# Patient Record
Sex: Male | Born: 1935 | Race: White | Hispanic: No | Marital: Married | Smoking: Never smoker
Health system: Southern US, Community
[De-identification: ages and names within clinical notes are randomized; demographics above are authoritative.]

## PROBLEM LIST (undated history)

## (undated) DIAGNOSIS — E7800 Pure hypercholesterolemia, unspecified: Secondary | ICD-10-CM

## (undated) DIAGNOSIS — T887XXA Unspecified adverse effect of drug or medicament, initial encounter: Secondary | ICD-10-CM

## (undated) DIAGNOSIS — I2119 ST elevation (STEMI) myocardial infarction involving other coronary artery of inferior wall: Secondary | ICD-10-CM

## (undated) DIAGNOSIS — N138 Other obstructive and reflux uropathy: Secondary | ICD-10-CM

## (undated) DIAGNOSIS — J9690 Respiratory failure, unspecified, unspecified whether with hypoxia or hypercapnia: Secondary | ICD-10-CM

## (undated) DIAGNOSIS — Z8672 Personal history of thrombophlebitis: Secondary | ICD-10-CM

## (undated) DIAGNOSIS — E039 Hypothyroidism, unspecified: Secondary | ICD-10-CM

## (undated) DIAGNOSIS — Z800 Family history of malignant neoplasm of digestive organs: Secondary | ICD-10-CM

## (undated) DIAGNOSIS — I10 Essential (primary) hypertension: Secondary | ICD-10-CM

## (undated) DIAGNOSIS — I2510 Atherosclerotic heart disease of native coronary artery without angina pectoris: Secondary | ICD-10-CM

## (undated) DIAGNOSIS — K219 Gastro-esophageal reflux disease without esophagitis: Secondary | ICD-10-CM

## (undated) DIAGNOSIS — I739 Peripheral vascular disease, unspecified: Secondary | ICD-10-CM

## (undated) DIAGNOSIS — Z79899 Other long term (current) drug therapy: Secondary | ICD-10-CM

## (undated) DIAGNOSIS — N401 Enlarged prostate with lower urinary tract symptoms: Secondary | ICD-10-CM

## (undated) HISTORY — PX: CORONARY ARTERY BYPASS GRAFT: SHX141

## (undated) HISTORY — PX: CATARACT EXTRACTION, BILATERAL: SHX1313

## (undated) HISTORY — PX: REPLACEMENT TOTAL KNEE: SUR1224

## (undated) HISTORY — PX: TONSILLECTOMY: SUR1361

## (undated) HISTORY — DX: Essential (primary) hypertension: I10

## (undated) HISTORY — DX: Gastro-esophageal reflux disease without esophagitis: K21.9

## (undated) HISTORY — DX: Benign prostatic hyperplasia with lower urinary tract symptoms: N40.1

## (undated) HISTORY — DX: Pure hypercholesterolemia, unspecified: E78.00

## (undated) HISTORY — PX: OTHER SURGICAL HISTORY: SHX169

## (undated) HISTORY — DX: Benign prostatic hyperplasia with lower urinary tract symptoms: N13.8

## (undated) HISTORY — DX: Other long term (current) drug therapy: Z79.899

## (undated) HISTORY — DX: Peripheral vascular disease, unspecified: I73.9

## (undated) HISTORY — DX: Unspecified adverse effect of drug or medicament, initial encounter: T88.7XXA

## (undated) HISTORY — DX: ST elevation (STEMI) myocardial infarction involving other coronary artery of inferior wall: I21.19

## (undated) HISTORY — PX: KNEE SURGERY: SHX244

## (undated) HISTORY — DX: Personal history of thrombophlebitis: Z86.72

## (undated) HISTORY — DX: Atherosclerotic heart disease of native coronary artery without angina pectoris: I25.10

## (undated) HISTORY — DX: Family history of malignant neoplasm of digestive organs: Z80.0

## (undated) HISTORY — DX: Hypothyroidism, unspecified: E03.9

## (undated) HISTORY — DX: Respiratory failure, unspecified, unspecified whether with hypoxia or hypercapnia: J96.90

---

## 2004-08-14 ENCOUNTER — Ambulatory Visit: Payer: Self-pay | Admitting: Internal Medicine

## 2004-10-07 ENCOUNTER — Emergency Department (HOSPITAL_COMMUNITY): Admission: EM | Admit: 2004-10-07 | Discharge: 2004-10-07 | Payer: Self-pay | Admitting: Emergency Medicine

## 2005-03-14 ENCOUNTER — Ambulatory Visit: Payer: Self-pay | Admitting: Internal Medicine

## 2005-09-19 ENCOUNTER — Ambulatory Visit: Payer: Self-pay | Admitting: Internal Medicine

## 2006-03-20 ENCOUNTER — Ambulatory Visit: Payer: Self-pay | Admitting: Internal Medicine

## 2006-04-15 ENCOUNTER — Ambulatory Visit: Payer: Self-pay | Admitting: Internal Medicine

## 2006-09-24 ENCOUNTER — Ambulatory Visit: Payer: Self-pay | Admitting: Internal Medicine

## 2006-09-24 LAB — CONVERTED CEMR LAB
Alkaline Phosphatase: 39 units/L (ref 39–117)
Amylase: 66 units/L (ref 27–131)
BUN: 16 mg/dL (ref 6–23)
Bilirubin, Direct: 0.1 mg/dL (ref 0.0–0.3)
CO2: 29 meq/L (ref 19–32)
GFR calc Af Amer: 95 mL/min
Potassium: 4.7 meq/L (ref 3.5–5.1)
Sodium: 141 meq/L (ref 135–145)
TSH: 1.87 microintl units/mL (ref 0.35–5.50)
Total Protein: 7.3 g/dL (ref 6.0–8.3)

## 2007-03-22 DIAGNOSIS — K219 Gastro-esophageal reflux disease without esophagitis: Secondary | ICD-10-CM | POA: Insufficient documentation

## 2007-03-22 DIAGNOSIS — I10 Essential (primary) hypertension: Secondary | ICD-10-CM

## 2007-03-26 ENCOUNTER — Ambulatory Visit: Payer: Self-pay | Admitting: Internal Medicine

## 2007-03-26 DIAGNOSIS — T887XXA Unspecified adverse effect of drug or medicament, initial encounter: Secondary | ICD-10-CM

## 2007-03-26 DIAGNOSIS — E039 Hypothyroidism, unspecified: Secondary | ICD-10-CM

## 2007-03-26 LAB — CONVERTED CEMR LAB
ALT: 26 units/L (ref 0–53)
AST: 27 units/L (ref 0–37)
Albumin: 4.2 g/dL (ref 3.5–5.2)
Alkaline Phosphatase: 38 units/L — ABNORMAL LOW (ref 39–117)
Cholesterol: 177 mg/dL (ref 0–200)
Total Bilirubin: 0.8 mg/dL (ref 0.3–1.2)
Total CHOL/HDL Ratio: 4.9
Total Protein: 7.1 g/dL (ref 6.0–8.3)

## 2007-09-28 ENCOUNTER — Ambulatory Visit: Payer: Self-pay | Admitting: Internal Medicine

## 2007-09-28 DIAGNOSIS — N400 Enlarged prostate without lower urinary tract symptoms: Secondary | ICD-10-CM

## 2007-09-28 LAB — CONVERTED CEMR LAB
AST: 29 units/L (ref 0–37)
Albumin: 4.2 g/dL (ref 3.5–5.2)
Alkaline Phosphatase: 35 units/L — ABNORMAL LOW (ref 39–117)
Chloride: 103 meq/L (ref 96–112)
GFR calc Af Amer: 85 mL/min
GFR calc non Af Amer: 70 mL/min
LDL Cholesterol: 118 mg/dL — ABNORMAL HIGH (ref 0–99)
PSA: 1.56 ng/mL (ref 0.10–4.00)
Potassium: 4.6 meq/L (ref 3.5–5.1)
Sodium: 138 meq/L (ref 135–145)
Total Bilirubin: 1 mg/dL (ref 0.3–1.2)
Total CHOL/HDL Ratio: 4.9
VLDL: 25 mg/dL (ref 0–40)

## 2008-03-24 ENCOUNTER — Ambulatory Visit: Payer: Self-pay | Admitting: Internal Medicine

## 2008-03-24 LAB — CONVERTED CEMR LAB
ALT: 31 units/L (ref 0–53)
AST: 30 units/L (ref 0–37)
Albumin: 4.2 g/dL (ref 3.5–5.2)
BUN: 29 mg/dL — ABNORMAL HIGH (ref 6–23)
CO2: 28 meq/L (ref 19–32)
Calcium: 9.2 mg/dL (ref 8.4–10.5)
Chloride: 104 meq/L (ref 96–112)
Cholesterol: 179 mg/dL (ref 0–200)
Creatinine, Ser: 1.3 mg/dL (ref 0.4–1.5)
HDL: 38.4 mg/dL — ABNORMAL LOW (ref >39.0)
LDL Cholesterol: 117 mg/dL — ABNORMAL HIGH (ref 0–99)
TSH: 2.3 microintl units/mL (ref 0.35–5.50)
Total Bilirubin: 0.7 mg/dL (ref 0.3–1.2)
Total CHOL/HDL Ratio: 4.7
Triglycerides: 119 mg/dL (ref 0–149)

## 2008-08-28 ENCOUNTER — Ambulatory Visit: Payer: Self-pay | Admitting: Cardiology

## 2008-08-28 ENCOUNTER — Ambulatory Visit: Payer: Self-pay | Admitting: Critical Care Medicine

## 2008-08-28 ENCOUNTER — Inpatient Hospital Stay (HOSPITAL_COMMUNITY): Admission: EM | Admit: 2008-08-28 | Discharge: 2008-10-04 | Payer: Self-pay | Admitting: Emergency Medicine

## 2008-08-29 ENCOUNTER — Ambulatory Visit: Payer: Self-pay | Admitting: Thoracic Surgery (Cardiothoracic Vascular Surgery)

## 2008-08-29 ENCOUNTER — Telehealth: Payer: Self-pay | Admitting: Family Medicine

## 2008-09-06 ENCOUNTER — Encounter: Payer: Self-pay | Admitting: Critical Care Medicine

## 2008-10-03 ENCOUNTER — Ambulatory Visit: Payer: Self-pay | Admitting: Physical Medicine & Rehabilitation

## 2008-10-04 ENCOUNTER — Ambulatory Visit: Payer: Self-pay | Admitting: Internal Medicine

## 2008-10-04 ENCOUNTER — Inpatient Hospital Stay (HOSPITAL_COMMUNITY)
Admission: RE | Admit: 2008-10-04 | Discharge: 2008-10-24 | Payer: Self-pay | Admitting: Physical Medicine & Rehabilitation

## 2008-10-04 ENCOUNTER — Ambulatory Visit: Payer: Self-pay | Admitting: Physical Medicine & Rehabilitation

## 2008-10-24 ENCOUNTER — Encounter: Payer: Self-pay | Admitting: Internal Medicine

## 2008-10-30 ENCOUNTER — Encounter: Payer: Self-pay | Admitting: Cardiology

## 2008-10-30 ENCOUNTER — Ambulatory Visit: Payer: Self-pay | Admitting: Thoracic Surgery (Cardiothoracic Vascular Surgery)

## 2008-10-30 ENCOUNTER — Encounter: Payer: Self-pay | Admitting: Internal Medicine

## 2008-10-30 ENCOUNTER — Encounter
Admission: RE | Admit: 2008-10-30 | Discharge: 2008-10-30 | Payer: Self-pay | Admitting: Thoracic Surgery (Cardiothoracic Vascular Surgery)

## 2008-10-31 ENCOUNTER — Ambulatory Visit: Payer: Self-pay | Admitting: Cardiology

## 2008-10-31 DIAGNOSIS — Z8672 Personal history of thrombophlebitis: Secondary | ICD-10-CM | POA: Insufficient documentation

## 2008-10-31 DIAGNOSIS — J9600 Acute respiratory failure, unspecified whether with hypoxia or hypercapnia: Secondary | ICD-10-CM

## 2008-10-31 DIAGNOSIS — I739 Peripheral vascular disease, unspecified: Secondary | ICD-10-CM | POA: Insufficient documentation

## 2008-10-31 DIAGNOSIS — I25810 Atherosclerosis of coronary artery bypass graft(s) without angina pectoris: Secondary | ICD-10-CM | POA: Insufficient documentation

## 2008-10-31 DIAGNOSIS — I2119 ST elevation (STEMI) myocardial infarction involving other coronary artery of inferior wall: Secondary | ICD-10-CM

## 2008-10-31 DIAGNOSIS — E7800 Pure hypercholesterolemia, unspecified: Secondary | ICD-10-CM | POA: Insufficient documentation

## 2008-11-03 ENCOUNTER — Ambulatory Visit (HOSPITAL_COMMUNITY)
Admission: RE | Admit: 2008-11-03 | Discharge: 2008-11-03 | Payer: Self-pay | Admitting: Physical Medicine & Rehabilitation

## 2008-11-03 ENCOUNTER — Ambulatory Visit: Payer: Self-pay | Admitting: Cardiology

## 2008-11-07 LAB — CONVERTED CEMR LAB
ALT: 19 units/L (ref 0–53)
AST: 20 units/L (ref 0–37)
Alkaline Phosphatase: 57 units/L (ref 39–117)
Basophils Relative: 0 % (ref 0.0–3.0)
Bilirubin, Direct: 0 mg/dL (ref 0.0–0.3)
Calcium: 9 mg/dL (ref 8.4–10.5)
Creatinine, Ser: 1.2 mg/dL (ref 0.4–1.5)
Eosinophils Absolute: 0.2 10*3/uL (ref 0.0–0.7)
Eosinophils Relative: 1.9 % (ref 0.0–5.0)
GFR calc non Af Amer: 63.01 mL/min (ref >60)
Hemoglobin: 13.3 g/dL (ref 13.0–17.0)
LDL Cholesterol: 115 mg/dL — ABNORMAL HIGH (ref 0–99)
Lymphocytes Relative: 44.8 % (ref 12.0–46.0)
Monocytes Relative: 9.9 % (ref 3.0–12.0)
Neutro Abs: 3.9 10*3/uL (ref 1.4–7.7)
Neutrophils Relative %: 43.4 % (ref 43.0–77.0)
RBC: 3.9 M/uL — ABNORMAL LOW (ref 4.22–5.81)
Sodium: 140 meq/L (ref 135–145)
Total Bilirubin: 0.8 mg/dL (ref 0.3–1.2)
Total CHOL/HDL Ratio: 5
Triglycerides: 121 mg/dL (ref 0.0–149.0)
WBC: 8.9 10*3/uL (ref 4.5–10.5)

## 2008-11-22 ENCOUNTER — Ambulatory Visit: Payer: Self-pay | Admitting: Cardiology

## 2008-11-23 ENCOUNTER — Telehealth: Payer: Self-pay | Admitting: *Deleted

## 2008-11-23 ENCOUNTER — Ambulatory Visit (HOSPITAL_COMMUNITY)
Admission: RE | Admit: 2008-11-23 | Discharge: 2008-11-23 | Payer: Self-pay | Admitting: Physical Medicine & Rehabilitation

## 2008-12-06 ENCOUNTER — Ambulatory Visit: Payer: Self-pay | Admitting: Internal Medicine

## 2008-12-06 DIAGNOSIS — Z951 Presence of aortocoronary bypass graft: Secondary | ICD-10-CM

## 2008-12-06 DIAGNOSIS — M1710 Unilateral primary osteoarthritis, unspecified knee: Secondary | ICD-10-CM

## 2008-12-13 ENCOUNTER — Encounter: Admission: RE | Admit: 2008-12-13 | Discharge: 2008-12-20 | Payer: Self-pay | Admitting: Internal Medicine

## 2008-12-19 ENCOUNTER — Encounter: Payer: Self-pay | Admitting: Internal Medicine

## 2008-12-21 ENCOUNTER — Telehealth: Payer: Self-pay | Admitting: Internal Medicine

## 2008-12-22 ENCOUNTER — Encounter: Admission: RE | Admit: 2008-12-22 | Discharge: 2009-02-22 | Payer: Self-pay | Admitting: Internal Medicine

## 2009-01-03 ENCOUNTER — Ambulatory Visit: Payer: Self-pay | Admitting: Cardiology

## 2009-01-05 ENCOUNTER — Ambulatory Visit: Payer: Self-pay | Admitting: Internal Medicine

## 2009-01-05 DIAGNOSIS — R498 Other voice and resonance disorders: Secondary | ICD-10-CM | POA: Insufficient documentation

## 2009-01-09 ENCOUNTER — Ambulatory Visit: Payer: Self-pay | Admitting: Cardiology

## 2009-01-13 LAB — CONVERTED CEMR LAB
ALT: 13 units/L (ref 0–53)
AST: 21 units/L (ref 0–37)
Cholesterol: 123 mg/dL (ref 0–200)
HDL: 35.8 mg/dL — ABNORMAL LOW (ref >39.00)
Total Bilirubin: 0.8 mg/dL (ref 0.3–1.2)
Total Protein: 6.6 g/dL (ref 6.0–8.3)

## 2009-02-05 ENCOUNTER — Telehealth: Payer: Self-pay | Admitting: Internal Medicine

## 2009-02-05 ENCOUNTER — Ambulatory Visit: Payer: Self-pay | Admitting: Internal Medicine

## 2009-02-05 DIAGNOSIS — C44300 Unspecified malignant neoplasm of skin of unspecified part of face: Secondary | ICD-10-CM | POA: Insufficient documentation

## 2009-03-02 ENCOUNTER — Encounter: Payer: Self-pay | Admitting: Internal Medicine

## 2009-03-13 ENCOUNTER — Ambulatory Visit: Payer: Self-pay | Admitting: Internal Medicine

## 2009-03-20 ENCOUNTER — Ambulatory Visit: Payer: Self-pay | Admitting: Cardiology

## 2009-04-04 ENCOUNTER — Ambulatory Visit: Payer: Self-pay | Admitting: Internal Medicine

## 2009-05-22 ENCOUNTER — Ambulatory Visit: Payer: Self-pay | Admitting: Cardiology

## 2009-05-25 ENCOUNTER — Encounter (INDEPENDENT_AMBULATORY_CARE_PROVIDER_SITE_OTHER): Payer: Self-pay | Admitting: *Deleted

## 2009-06-11 ENCOUNTER — Ambulatory Visit: Payer: Self-pay | Admitting: Internal Medicine

## 2009-06-11 LAB — CONVERTED CEMR LAB
Bilirubin, Direct: 0 mg/dL (ref 0.0–0.3)
Calcium: 9.2 mg/dL (ref 8.4–10.5)
Creatinine, Ser: 1.3 mg/dL (ref 0.4–1.5)
HDL: 36.4 mg/dL — ABNORMAL LOW (ref >39.00)
LDL Cholesterol: 62 mg/dL (ref 0–99)
Sodium: 140 meq/L (ref 135–145)
Total Bilirubin: 0.9 mg/dL (ref 0.3–1.2)
Total CHOL/HDL Ratio: 3
Triglycerides: 115 mg/dL (ref 0.0–149.0)
VLDL: 23 mg/dL (ref 0.0–40.0)

## 2009-06-25 ENCOUNTER — Ambulatory Visit: Payer: Self-pay | Admitting: Internal Medicine

## 2009-06-25 DIAGNOSIS — L570 Actinic keratosis: Secondary | ICD-10-CM

## 2009-09-13 ENCOUNTER — Encounter: Payer: Self-pay | Admitting: Cardiology

## 2009-09-13 ENCOUNTER — Ambulatory Visit: Payer: Self-pay

## 2009-09-13 ENCOUNTER — Ambulatory Visit: Payer: Self-pay | Admitting: Cardiology

## 2009-09-13 ENCOUNTER — Ambulatory Visit (HOSPITAL_COMMUNITY): Admission: RE | Admit: 2009-09-13 | Discharge: 2009-09-13 | Payer: Self-pay | Admitting: Cardiology

## 2009-09-13 ENCOUNTER — Ambulatory Visit: Payer: Self-pay | Admitting: Internal Medicine

## 2009-09-25 ENCOUNTER — Telehealth: Payer: Self-pay | Admitting: Cardiology

## 2009-09-25 ENCOUNTER — Ambulatory Visit: Payer: Self-pay | Admitting: Internal Medicine

## 2009-09-26 ENCOUNTER — Encounter (INDEPENDENT_AMBULATORY_CARE_PROVIDER_SITE_OTHER): Payer: Self-pay | Admitting: *Deleted

## 2009-10-09 ENCOUNTER — Ambulatory Visit: Payer: Self-pay | Admitting: Cardiovascular Disease

## 2009-10-09 ENCOUNTER — Ambulatory Visit (HOSPITAL_COMMUNITY): Admission: RE | Admit: 2009-10-09 | Discharge: 2009-10-09 | Payer: Self-pay | Admitting: Cardiology

## 2009-10-17 ENCOUNTER — Ambulatory Visit: Payer: Self-pay | Admitting: Internal Medicine

## 2009-11-05 ENCOUNTER — Inpatient Hospital Stay (HOSPITAL_COMMUNITY): Admission: RE | Admit: 2009-11-05 | Discharge: 2009-11-07 | Payer: Self-pay | Admitting: Orthopedic Surgery

## 2009-11-28 ENCOUNTER — Encounter: Admission: RE | Admit: 2009-11-28 | Discharge: 2009-12-20 | Payer: Self-pay | Admitting: Orthopedic Surgery

## 2009-12-21 ENCOUNTER — Encounter: Admission: RE | Admit: 2009-12-21 | Discharge: 2010-01-04 | Payer: Self-pay | Admitting: Orthopedic Surgery

## 2010-01-16 ENCOUNTER — Ambulatory Visit: Payer: Self-pay | Admitting: Internal Medicine

## 2010-01-16 LAB — CONVERTED CEMR LAB
ALT: 26 units/L (ref 0–53)
AST: 34 units/L (ref 0–37)
Alkaline Phosphatase: 55 units/L (ref 39–117)
BUN: 21 mg/dL (ref 6–23)
Bilirubin, Direct: 0.1 mg/dL (ref 0.0–0.3)
Chloride: 102 meq/L (ref 96–112)
Creatinine, Ser: 1.2 mg/dL (ref 0.4–1.5)
GFR calc non Af Amer: 65.31 mL/min (ref >60)
Total Protein: 7.5 g/dL (ref 6.0–8.3)

## 2010-03-12 ENCOUNTER — Encounter: Payer: Self-pay | Admitting: Internal Medicine

## 2010-03-13 ENCOUNTER — Ambulatory Visit: Payer: Self-pay | Admitting: Internal Medicine

## 2010-03-22 ENCOUNTER — Ambulatory Visit: Payer: Self-pay | Admitting: Cardiology

## 2010-03-22 DIAGNOSIS — I255 Ischemic cardiomyopathy: Secondary | ICD-10-CM | POA: Insufficient documentation

## 2010-03-27 ENCOUNTER — Ambulatory Visit: Payer: Self-pay | Admitting: Cardiology

## 2010-03-28 LAB — CONVERTED CEMR LAB
Calcium: 9.3 mg/dL (ref 8.4–10.5)
GFR calc non Af Amer: 67.28 mL/min (ref >60)
Glucose, Bld: 99 mg/dL (ref 70–99)
Sodium: 140 meq/L (ref 135–145)

## 2010-04-17 ENCOUNTER — Ambulatory Visit: Payer: Self-pay | Admitting: Cardiology

## 2010-04-22 ENCOUNTER — Ambulatory Visit: Payer: Self-pay | Admitting: Internal Medicine

## 2010-04-22 DIAGNOSIS — D518 Other vitamin B12 deficiency anemias: Secondary | ICD-10-CM | POA: Insufficient documentation

## 2010-04-22 LAB — CONVERTED CEMR LAB
Chloride: 98 meq/L (ref 96–112)
Creatinine, Ser: 1.3 mg/dL (ref 0.4–1.5)

## 2010-04-23 ENCOUNTER — Telehealth: Payer: Self-pay | Admitting: Internal Medicine

## 2010-04-26 LAB — CONVERTED CEMR LAB
Basophils Relative: 0.7 % (ref 0.0–3.0)
Eosinophils Relative: 6.1 % — ABNORMAL HIGH (ref 0.0–5.0)
GFR calc non Af Amer: 61 mL/min (ref >60)
Glucose, Bld: 106 mg/dL — ABNORMAL HIGH (ref 70–99)
HCT: 38.3 % — ABNORMAL LOW (ref 39.0–52.0)
Hemoglobin: 13.4 g/dL (ref 13.0–17.0)
Lymphs Abs: 3.3 10*3/uL (ref 0.7–4.0)
Monocytes Relative: 11.3 % (ref 3.0–12.0)
Neutro Abs: 3.9 10*3/uL (ref 1.4–7.7)
Potassium: 4.3 meq/L (ref 3.5–5.1)
RBC: 3.9 M/uL — ABNORMAL LOW (ref 4.22–5.81)
Sodium: 137 meq/L (ref 135–145)
WBC: 8.9 10*3/uL (ref 4.5–10.5)

## 2010-06-04 ENCOUNTER — Ambulatory Visit: Payer: Self-pay | Admitting: Cardiology

## 2010-06-04 ENCOUNTER — Encounter: Payer: Self-pay | Admitting: Cardiology

## 2010-06-13 ENCOUNTER — Ambulatory Visit: Payer: Self-pay | Admitting: Cardiology

## 2010-06-14 LAB — CONVERTED CEMR LAB
CO2: 29 meq/L (ref 19–32)
Calcium: 8.7 mg/dL (ref 8.4–10.5)
Creatinine, Ser: 1.2 mg/dL (ref 0.4–1.5)
Glucose, Bld: 83 mg/dL (ref 70–99)

## 2010-07-21 LAB — CONVERTED CEMR LAB
BUN: 14 mg/dL (ref 6–23)
Calcium: 9.6 mg/dL (ref 8.4–10.5)
GFR calc non Af Amer: 69.63 mL/min (ref >60)
Glucose, Bld: 120 mg/dL — ABNORMAL HIGH (ref 70–99)
Sodium: 145 meq/L (ref 135–145)

## 2010-07-22 ENCOUNTER — Ambulatory Visit
Admission: RE | Admit: 2010-07-22 | Discharge: 2010-07-22 | Payer: Self-pay | Source: Home / Self Care | Attending: Internal Medicine | Admitting: Internal Medicine

## 2010-07-24 NOTE — Assessment & Plan Note (Signed)
Summary: 3 MNTH ROV//SLM   Vital Signs:  Patient profile:   77 year old male Height:      74 inches Weight:      192 pounds BMI:     24.74 Temp:     98.2 degrees F oral Pulse rate:   64 / minute Resp:     14 per minute BP sitting:   136 / 80  (left arm)  Vitals Entered By: Willy Eddy, LPN (September 25, 1608 9:14 AM) CC: roa- has knee replacement su rgery scheduled for 2 weeks and asking for clearance from echo   Primary Care Provider:  Stacie Glaze MD  CC:  roa- has knee replacement su rgery scheduled for 2 weeks and asking for clearance from echo.  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 Functioning status is above what the echo suggests Pt will wait for a call from Dr Carles Collet office.  Preventive Screening-Counseling & Management  Alcohol-Tobacco     Smoking Status: never  Problems Prior to Update: 1)  Actinic Keratosis  (ICD-702.0) 2)  Loc Osteoarthros Not Spec Prim/sec Lower Leg  (ICD-715.36) 3)  Basal Cell Carcinoma, Nose  (ICD-173.3) 4)  Hoarseness  (ICD-784.49) 5)  Osteoarthritis, Lower Leg, Left  (ICD-715.36) 6)  Coronary Artery Bypass Graft, Hx of  (ICD-V45.81) 7)  Cad, Artery Bypass Graft  (ICD-414.04) 8)  Myocardial Infarction, Inferoposterior Wall, 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) 9)  Hypertension  (ICD-401.9) 10)  Hypercholesterolemia  (ICD-272.0) 11)  Pvd  (ICD-443.9) 12)  Deep Venous Thrombophlebitis, Hx of  (ICD-V12.52) 13)  Encounter For Long-term Use of Other Medications  (ICD-V58.69) 14)  Respiratory Failure  (ICD-518.81) 15)  Gerd  (ICD-530.81) 16)  Benign  Prostatic Hypertrophy, With Urinary Obstruction  (ICD-600.01) 17)  Advef, Drug/medicinal/biological Subst Nos  (ICD-995.20) 18)  Hypothyroidism  (ICD-244.9) 19)  Family History of Colon Ca 1st Degree Relative <60  (ICD-V16.0)  Current Problems (verified): 1)  Actinic Keratosis  (ICD-702.0) 2)  Loc Osteoarthros Not Spec Prim/sec Lower Leg  (ICD-715.36) 3)  Basal Cell Carcinoma, Nose  (ICD-173.3) 4)  Hoarseness  (ICD-784.49) 5)  Osteoarthritis, Lower Leg, Left  (ICD-715.36) 6)  Coronary Artery Bypass Graft, Hx of  (ICD-V45.81) 7)  Cad, Artery Bypass Graft  (ICD-414.04) 8)  Myocardial Infarction, Inferoposterior Wall, 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) 9)  Hypertension  (ICD-401.9) 10)  Hypercholesterolemia  (ICD-272.0) 11)  Pvd  (ICD-443.9) 12)  Deep Venous Thrombophlebitis, Hx of  (ICD-V12.52) 13)  Encounter For Long-term Use of Other Medications  (ICD-V58.69) 14)  Respiratory Failure  (ICD-518.81) 15)  Gerd  (ICD-530.81) 16)  Benign Prostatic Hypertrophy, With Urinary Obstruction  (ICD-600.01) 17)  Advef, Drug/medicinal/biological Subst Nos  (ICD-995.20) 18)  Hypothyroidism  (ICD-244.9) 19)  Family History of Colon Ca 1st Degree Relative <60  (ICD-V16.0)  Medications Prior to Update: 1)  Levothyroxine Sodium 50 Mcg Tabs (Levothyroxine Sodium) .... One By Mouth Daily 2)  Bayer Aspirin 325 Mg  Tabs (Aspirin) .... Once Daily 3)  Flomax 0.4 Mg Xr24h-Cap (Tamsulosin Hcl) .... Take 1 Capsule By Mouth Once  A Day 4)  Lasix 40 Mg Tabs (Furosemide) .... 1/2 Once Daily 5)  Potassium Chloride Crys Cr 20 Meq Cr-Tabs (Potassium Chloride Crys Cr) .... Take 1/2 Tablet By Mouth Once A Day 6)  Lopressor 6.58ml Suspension .... Two Times A Day 7)  Simvastatin 20 Mg Tabs (Simvastatin) .... Take One Tablet By Mouth Daily At Bedtime 8)  Nexium 40 Mg Cpdr (Esomeprazole Magnesium) .... One By Mouth Daily As Needed 9)  Centrum Silver  Tabs (Multiple Vitamins-Minerals) .... Take 1 Tablet By Mouth Once A  Day  Current Medications (verified): 1)  Levothyroxine Sodium 50 Mcg Tabs (Levothyroxine Sodium) .... One By Mouth Daily 2)  Bayer Aspirin 325 Mg  Tabs (Aspirin) .... Once Daily 3)  Flomax 0.4 Mg Xr24h-Cap (Tamsulosin Hcl) .... Take 1 Capsule By Mouth Once A Day 4)  Lasix 40 Mg Tabs (Furosemide) .... 1/2 Once Daily 5)  Potassium Chloride Crys Cr 20 Meq Cr-Tabs (Potassium Chloride Crys Cr) .... Take 1/2 Tablet By Mouth Once A Day 6)  Lopressor 6.61ml Suspension .... Two Times A Day 7)  Simvastatin 20 Mg Tabs (Simvastatin) .... Take One Tablet By Mouth Daily At Bedtime 8)  Nexium 40 Mg Cpdr (Esomeprazole Magnesium) .... One By Mouth Daily As Needed 9)  Centrum Silver  Tabs (Multiple Vitamins-Minerals) .... Take 1 Tablet By Mouth Once A Day  Allergies (verified): 1)  ! Pcn 2)  ! Cozaar 3)  ! Naprosyn  Past History:  Family History: Last updated: 11/17/2008  Positive CAD.      Social History: Last updated: 11/17/2008  Married, retired.  Wife can assist as needed.  Daughter   can also assist, one-level home and one step to enter.    Nonsmoker  Risk Factors: Smoking Status: never (09/25/2009)  Past medical, surgical, family and social histories (including risk factors) reviewed, and no changes noted (except as noted below).  Past Medical History: Reviewed history from 11/17/2008 and no changes required. Current Problems:  CAD, ARTERY BYPASS GRAFT (ICD-414.04) MYOCARDIAL INFARCTION, INFEROPOSTERIOR WALL, SUBSEQUENT CARE (ICD-410.32) HYPERTENSION (ICD-401.9) HYPERCHOLESTEROLEMIA (ICD-272.0) PVD (ICD-443.9) DEEP VENOUS THROMBOPHLEBITIS, HX OF (ICD-V12.52) ENCOUNTER FOR LONG-TERM USE OF OTHER MEDICATIONS (ICD-V58.69) RESPIRATORY FAILURE (ICD-518.81) GERD (ICD-530.81) BENIGN PROSTATIC HYPERTROPHY, WITH URINARY OBSTRUCTION (ICD-600.01) ADVEF, DRUG/MEDICINAL/BIOLOGICAL SUBST NOS (ICD-995.20) HYPOTHYROIDISM (ICD-244.9) FAMILY HISTORY OF COLON CA 1ST DEGREE RELATIVE <60  (ICD-V16.0)  Past Surgical History: Reviewed history from 11/17/2008 and no changes required. Tonsillectomy Knee Surgery coronary  artery bypass graft x5 on August 29, 2008.   tracheostomy placement       Family History: Reviewed history from 11/17/2008 and no changes required.  Positive CAD.      Social History: Reviewed history from 11/17/2008 and no changes required.  Married, retired.  Wife can assist as needed.  Daughter   can also assist, one-level home and one step to enter.    Nonsmoker  Review of Systems  The patient denies anorexia, fever, weight loss, weight gain, vision loss, decreased hearing, hoarseness, chest pain, syncope, dyspnea on exertion, peripheral edema, prolonged cough, headaches, hemoptysis, abdominal pain, melena, hematochezia, severe indigestion/heartburn, hematuria, incontinence, genital sores, muscle weakness, suspicious skin lesions, transient blindness, difficulty walking, depression, unusual weight change, abnormal bleeding, enlarged lymph nodes, angioedema, breast masses, and testicular masses.         no increassed SOB or swelling in feet, weigth is stable  Physical Exam  General:  alert, well-developed, and pale.   Head:  normocephalic and atraumatic.   Eyes:  pupils equal and pupils round.  Ears:  R ear normal and L ear normal.   Nose:  no external deformity and no nasal discharge.   Neck:  No deformities, masses, or tenderness noted. Lungs:  normal respiratory effort and no wheezes.   Heart:  regular rhythm, no gallop, and no rub.  heart sounds are reduced  pulse  was 70  Abdomen:  soft, no masses, no rigidity, and bowel sounds hyperactive.   Msk:  decreased ROM and joint tenderness.  left knee loss of joint space and audible mice Extremities:  no edema!, good color Neurologic:  alert & oriented X3 and finger-to-nose normal.     Impression & Recommendations:  Problem # 1:  LOC OSTEOARTHROS NOT SPEC PRIM/SEC LOWER LEG  (ICD-715.36)  severe OA with TKR scheduelde on left with Dr Charlann Boxer surgery pending completion of EF evaluaton discussed with the pt short term anticoagulation MRI to be scheduled, surgery on hold until then cardilogy to give formal surgical approval due to timeing of MRI His updated medication list for this problem includes:    Bayer Aspirin 325 Mg Tabs (Aspirin) ..... Once daily  Discussed use of medications, application of heat or cold, and exercises.   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 Once daily    Lasix 40 Mg Tabs (Furosemide) .Marland Kitchen... 1/2 once daily    Carvedilol 3.125 Mg Tabs (Carvedilol) ..... One by mouth two times a day  Labs Reviewed: Chol: 121 (06/11/2009)   HDL: 36.40 (06/11/2009)   LDL: 62 (06/11/2009)   TG: 115.0 (06/11/2009)  Problem # 3:  HYPERTENSION (ICD-401.9)  on lopressor suspension he can swallow lasix so I feel he can swallow coreg and this may help his EF stop the lopressor and satrt coreg His updated medication list for this problem includes:    Lasix 40 Mg Tabs (Furosemide) .Marland Kitchen... 1/2 once daily    Carvedilol 3.125 Mg Tabs (Carvedilol) ..... One by mouth two times a day will start with coreg 3.125 two times a day for several weeks then hope to increase to 6.25 BID  BP today: 136/80 Prior BP: 134/80 (09/13/2009)  Prior 10 Yr Risk Heart Disease: N/A (03/13/2009)  Labs Reviewed: K+: 4.3 (06/11/2009) Creat: : 1.3 (06/11/2009)   Chol: 121 (06/11/2009)   HDL: 36.40 (06/11/2009)   LDL: 62 (06/11/2009)   TG: 115.0 (06/11/2009)  Complete Medication List: 1)  Levothyroxine Sodium 50 Mcg Tabs (Levothyroxine sodium) .... One by mouth daily 2)  Bayer Aspirin 325 Mg Tabs (Aspirin) .... Once daily 3)  Flomax 0.4 Mg Xr24h-cap (Tamsulosin hcl) .... Take 1 capsule by  mouth once a day 4)  Lasix 40 Mg Tabs (Furosemide) .... 1/2 once daily 5)  Potassium Chloride Crys Cr 20 Meq Cr-tabs (Potassium chloride crys cr) .... Take 1/2 tablet by mouth once a day 6)  Simvastatin 20 Mg Tabs (Simvastatin) .... Take one tablet by mouth daily at bedtime 7)  Nexium 40 Mg Cpdr (Esomeprazole magnesium) .... One by mouth daily as needed 8)  Centrum Silver Tabs (Multiple vitamins-minerals) .... Take 1 tablet by mouth once a day 9)  Carvedilol 3.125 Mg Tabs (Carvedilol) .... One by mouth two times a day  Patient Instructions: 1)  Will convert the lopressor to coreg and get the MRI ASAP with approval via cards and  follow up  BP in 2 weeks with goal to increase the coreg to 6.25 two times a day 2)  if tolerated 3)  Please schedule a follow-up appointment in 2-3 weeks. Prescriptions: CARVEDILOL 3.125 MG TABS (CARVEDILOL) one by mouth two times a day  #60 x 0   Entered and Authorized by:   Stacie Glaze MD   Signed by:   Stacie Glaze MD on 09/25/2009   Method used:   Electronically to        [facility] [phone]* (retail)       [address] [place], [place]  [address]       Ph: [phone] or [phone]       Fax: [phone]   RxID:   [id number]

## 2010-07-24 NOTE — Assessment & Plan Note (Signed)
Summary: flu shot/cjr  Nurse Visit   Allergies: 1)  ! Pcn 2)  ! Cozaar 3)  ! Naprosyn  Orders Added: 1)  Flu Vaccine 85yrs + MEDICARE PATIENTS [Q2039] 2)  Administration Flu vaccine - MCR [G0008] Flu Vaccine Consent Questions     Do you have a history of severe allergic reactions to this vaccine? no    Any prior history of allergic reactions to egg and/or gelatin? no    Do you have a sensitivity to the preservative Thimersol? no    Do you have a past history of Guillan-Barre Syndrome? no    Do you currently have an acute febrile illness? no    Have you ever had a severe reaction to latex? no    Vaccine information given and explained to patient? yes    Are you currently pregnant? no    Lot Number:[id number]   Exp Date:12/21/2010   Site Given  Left Deltoid [email or web address]

## 2010-07-24 NOTE — Progress Notes (Signed)
Summary: REQUEST FOR MED  Phone Note Refill Request Message from:  Patient on April 23, 2010 11:54 AM  Refills Requested: Medication #1:  FOLBEE PLUS  TABS one by mouth DAILY.   Notes: [facility]    Initial call taken by: Debbra Riding,  April 23, 2010 11:53 AM    Prescriptions: Maudie Flakes PLUS  TABS (B COMPLEX-C-FOLIC ACID) one by mouth DAILY  #30 x 6   Entered by:   Willy Eddy, LPN   Authorized by:   Stacie Glaze MD   Signed by:   Willy Eddy, LPN on 16/03/9603   Method used:   Electronically to        [facility] [phone]* (retail)       [address] [place], [place]  [address]       Ph: [phone] or [phone]       Fax: [phone]   RxID:   [id number]

## 2010-07-24 NOTE — Progress Notes (Signed)
Summary: Pt needs Cardiac MRI  Phone Note Outgoing Call   Call placed by: Julieta Gutting, RN, BSN,  September 25, 2009 3:01 PM Call placed to: Patient Summary of Call: I discussed this pt with Dr Shirlee Latch since he is s/p CABG 1 year ago.  The pt's sternal wires will not effect this test. Dr Riley Kill would like the pt  to have a Cardiac MRI to evaluate accurate assessment of EF and also look at scar size regarding EP discussions.  The pt is aware that he requires further testing before proceeding with knee surgery.  I will place an order for Cardiac MRI and the pt will be contacted by [facility] with appointment.  Initial call taken by: Julieta Gutting, RN, BSN,  September 25, 2009 3:01 PM

## 2010-07-24 NOTE — Assessment & Plan Note (Signed)
Summary: 3 wk rov/njr   Vital Signs:  Patient profile:   77 year old male Height:      74 inches Weight:      190 pounds BMI:     24.48 Temp:     98.3 degrees F oral Pulse rate:   64 / minute Resp:     14 per minute BP sitting:   130 / 80  (left arm)  Vitals Entered By: Willy Eddy, LPN (October 17, 2009 10:55 AM) CC: roa- to discuss medical clearance for knee replacement surgery, Hypertension Management   Primary Care Provider:  Stacie Glaze MD  CC:  roa- to discuss medical clearance for knee replacement surgery and Hypertension Management.  History of Present Illness: THE PT PRESENTS FOR REVEIW OF THE MRI AND THE CONTROL OF HIS BLOOD PRESSURE AND EF WITH CHANGE TO CARVEDOLOL   Hypertension History:      BETTER BLOOD PRESURE CONTROL WITH HOME READINGS IN THE 120 RANGE.        Positive major cardiovascular risk factors include male age 83 years old or older, hyperlipidemia, and hypertension.  Negative major cardiovascular risk factors include non-tobacco-user status.        Positive history for target organ damage include ASHD (either angina/prior MI/prior CABG) and peripheral vascular disease.     Preventive Screening-Counseling & Management  Alcohol-Tobacco     Smoking Status: never  Problems Prior to Update: 1)  Actinic Keratosis  (ICD-702.0) 2)  Loc Osteoarthros Not Spec Prim/sec Lower Leg  (ICD-715.36) 3)  Basal Cell Carcinoma, Nose  (ICD-173.3) 4)  Hoarseness  (ICD-784.49) 5)  Osteoarthritis, Lower Leg, Left  (ICD-715.36) 6)  Coronary Artery Bypass Graft, Hx of  (ICD-V45.81) 7)  Cad, Artery Bypass Graft  (ICD-414.04) 8)  Myocardial Infarction, Inferoposterior Wall, 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) 9)  Hypertension  (ICD-401.9) 10)  Hypercholesterolemia  (ICD-272.0) 11)  Pvd  (ICD-443.9) 12)  Deep Venous Thrombophlebitis, Hx of  (ICD-V12.52) 13)  Encounter For Long-term Use of Other Medications  (ICD-V58.69) 14)  Respiratory Failure  (ICD-518.81) 15)  Gerd   (ICD-530.81) 16)  Benign Prostatic Hypertrophy, With Urinary Obstruction  (ICD-600.01) 17)  Advef, Drug/medicinal/biological Subst Nos  (ICD-995.20) 18)  Hypothyroidism  (ICD-244.9) 19)  Family History of Colon Ca 1st Degree Relative <60  (ICD-V16.0)  Current Problems (verified): 1)  Actinic Keratosis  (ICD-702.0) 2)  Loc Osteoarthros Not Spec Prim/sec Lower Leg  (ICD-715.36) 3)  Basal Cell Carcinoma, Nose  (ICD-173.3) 4)  Hoarseness  (ICD-784.49) 5)  Osteoarthritis, Lower Leg, Left  (ICD-715.36) 6)  Coronary Artery Bypass Graft, Hx of  (ICD-V45.81) 7)  Cad, Artery Bypass Graft  (ICD-414.04) 8)  Myocardial Infarction, Inferoposterior Wall, 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) 9)  Hypertension  (ICD-401.9) 10)  Hypercholesterolemia  (ICD-272.0) 11)  Pvd  (ICD-443.9) 12)  Deep Venous Thrombophlebitis, Hx of  (ICD-V12.52) 13)  Encounter For Long-term Use of Other Medications  (ICD-V58.69) 14)  Respiratory Failure  (ICD-518.81) 15)  Gerd  (ICD-530.81) 16)  Benign Prostatic Hypertrophy, With Urinary Obstruction  (ICD-600.01) 17)  Advef, Drug/medicinal/biological Subst Nos  (ICD-995.20) 18)  Hypothyroidism  (ICD-244.9) 19)  Family History of Colon Ca 1st Degree Relative <60  (ICD-V16.0)  Medications Prior to Update: 1)  Levothyroxine Sodium 50 Mcg Tabs (Levothyroxine Sodium) .... One By Mouth Daily 2)  Bayer Aspirin 325 Mg  Tabs (Aspirin) .... Once Daily 3)  Flomax 0.4 Mg Xr24h-Cap (Tamsulosin Hcl) .... Take 1 Capsule By Mouth Once A Day 4)  Lasix 40 Mg  Tabs (Furosemide) .... 1/2 Once Daily 5)  Potassium Chloride Crys Cr 20 Meq Cr-Tabs (Potassium Chloride Crys Cr) .... Take 1/2 Tablet By Mouth Once A Day 6)  Simvastatin 20 Mg Tabs (Simvastatin) .... Take One Tablet By Mouth Daily At Bedtime 7)  Nexium 40 Mg Cpdr (Esomeprazole Magnesium) .... One By Mouth Daily As Needed 8)  Centrum Silver  Tabs (Multiple Vitamins-Minerals) .... Take 1 Tablet By Mouth Once A Day 9)  Carvedilol 3.125 Mg Tabs  (Carvedilol) .... One By Mouth Two Times A Day  Current Medications (verified): 1)  Levothyroxine Sodium 50 Mcg Tabs (Levothyroxine Sodium) .... One By Mouth Daily 2)  Bayer Aspirin 325 Mg  Tabs (Aspirin) .... Once Daily 3)  Flomax 0.4 Mg Xr24h-Cap (Tamsulosin Hcl) .... Take 1 Capsule By Mouth Once A Day 4)  Lasix 40 Mg Tabs (Furosemide) .... 1/2 Once Daily 5)  Potassium Chloride Crys Cr 20 Meq Cr-Tabs (Potassium Chloride Crys Cr) .... Take 1/2 Tablet By Mouth Once A Day 6)  Simvastatin 20 Mg Tabs (Simvastatin) .... Take One Tablet By Mouth Daily At Bedtime 7)  Nexium 40 Mg Cpdr (Esomeprazole Magnesium) .... One By Mouth Daily As Needed 8)  Centrum Silver  Tabs (Multiple Vitamins-Minerals) .... Take 1 Tablet By Mouth Once A Day 9)  Carvedilol 3.125 Mg Tabs (Carvedilol) .... One By Mouth Two Times A Day  Allergies (verified): 1)  ! Pcn 2)  ! Cozaar 3)  ! Naprosyn  Past History:  Family History: Last updated: 11/17/2008  Positive CAD.      Social History: Last updated: 11/17/2008  Married, retired.  Wife can assist as needed.  Daughter   can also assist, one-level home and one step to enter.    Nonsmoker  Risk Factors: Smoking Status: never (10/17/2009)  Past medical, surgical, family and social histories (including risk factors) reviewed, and no changes noted (except as noted below).  Past Medical History: Reviewed history from 11/17/2008 and no changes required. Current Problems:  CAD, ARTERY BYPASS GRAFT (ICD-414.04) MYOCARDIAL INFARCTION, INFEROPOSTERIOR WALL, SUBSEQUENT CARE (ICD-410.32) HYPERTENSION (ICD-401.9) HYPERCHOLESTEROLEMIA (ICD-272.0) PVD (ICD-443.9) DEEP VENOUS THROMBOPHLEBITIS, HX OF (ICD-V12.52) ENCOUNTER FOR LONG-TERM USE OF OTHER MEDICATIONS (ICD-V58.69) RESPIRATORY FAILURE (ICD-518.81) GERD (ICD-530.81) BENIGN PROSTATIC HYPERTROPHY, WITH URINARY OBSTRUCTION (ICD-600.01) ADVEF, DRUG/MEDICINAL/BIOLOGICAL SUBST NOS (ICD-995.20) HYPOTHYROIDISM  (ICD-244.9) FAMILY HISTORY OF COLON CA 1ST DEGREE RELATIVE <60 (ICD-V16.0)  Past Surgical History: Reviewed history from 11/17/2008 and no changes required. Tonsillectomy Knee Surgery coronary  artery bypass graft x5 on August 29, 2008.   tracheostomy placement       Family History: Reviewed history from 11/17/2008 and no changes required.  Positive CAD.      Social History: Reviewed history from 11/17/2008 and no changes required.  Married, retired.  Wife can assist as needed.  Daughter   can also assist, one-level home and one step to enter.    Nonsmoker  Review of Systems       The patient complains of dyspnea on exertion.  The patient denies anorexia, fever, weight loss, weight gain, vision loss, decreased hearing, hoarseness, chest pain, syncope, peripheral edema, prolonged cough, headaches, hemoptysis, abdominal pain, melena, hematochezia, severe indigestion/heartburn, hematuria, incontinence, genital sores, muscle weakness, suspicious skin lesions, transient blindness, difficulty walking, depression, unusual weight change, abnormal bleeding, enlarged lymph nodes, angioedema, breast masses, and testicular masses.    Physical Exam  General:  alert, well-developed, and pale.   Eyes:  pupils equal and pupils round.   Ears:  R ear normal and L ear normal.  Nose:  no external deformity and no nasal discharge.   Neck:  No deformities, masses, or tenderness noted. Lungs:  normal respiratory effort and no wheezes.   Heart:  regular rhythm, no gallop, and no rub.  heart sounds are reduced  pulse  was 70  Abdomen:  soft and no masses.   Msk:  decreased ROM and joint tenderness.  KNEES MUSCLE WAISTING IN LEGS BILATERALLY  Extremities:  No clubbing, cyanosis, edema, or deformity noted with normal full range of motion of all joints.   Neurologic:  alert & oriented X3, finger-to-nose normal, and abnormal gait.     Impression & Recommendations:  Problem # 1:  LOC OSTEOARTHROS NOT  SPEC PRIM/SEC LOWER LEG (ICD-715.36) we weighed the risks of the surgery with the onging risks of immobility I feel that he  has strong risks if he developes immobity that are not out weighed buy the surgical risks the MRI showed stable CO  with ejection fration about 35% THE PT IS AWARE OF THE RISKS AND NEEDS TO HAVE THE REPLACEMENT His updated medication list for this problem includes:    Bayer Aspirin 325 Mg Tabs (Aspirin) ..... Once daily  Problem # 2:  HYPERTENSION (ICD-401.9) Assessment: Improved THE PT WAS CHANGEDTO CARVEDOLOL His updated medication list for this problem includes:    Lasix 40 Mg Tabs (Furosemide) .Marland Kitchen... 1/2 once daily    Carvedilol 3.125 Mg Tabs (Carvedilol) ..... One by mouth two times a day  BP today: 130/80 Prior BP: 136/80 (09/25/2009)  Prior 10 Yr Risk Heart Disease: N/A (03/13/2009)  Labs Reviewed: K+: 4.3 (06/11/2009) Creat: : 1.3 (06/11/2009)   Chol: 121 (06/11/2009)   HDL: 36.40 (06/11/2009)   LDL: 62 (06/11/2009)   TG: 115.0 (06/11/2009)  Problem # 3:  CAD, ARTERY BYPASS GRAFT (ICD-414.04) HE MAY NEEDS A DEFIBRILATOR IN THE FUTURE EF IN THE 35% RANGE WITH STABLE FUNCTION CLASS 2 His updated medication list for this problem includes:    Bayer Aspirin 325 Mg Tabs (Aspirin) ..... Once daily    Lasix 40 Mg Tabs (Furosemide) .Marland Kitchen... 1/2 once daily    Carvedilol 3.125 Mg Tabs (Carvedilol) ..... One by mouth two times a day  Labs Reviewed: Chol: 121 (06/11/2009)   HDL: 36.40 (06/11/2009)   LDL: 62 (06/11/2009)   TG: 115.0 (06/11/2009)  Complete Medication List: 1)  Levothyroxine Sodium 50 Mcg Tabs (Levothyroxine sodium) .... One by mouth daily 2)  Bayer Aspirin 325 Mg Tabs (Aspirin) .... Once daily 3)  Flomax 0.4 Mg Xr24h-cap (Tamsulosin hcl) .... Take 1 capsule by mouth once a day 4)  Lasix 40 Mg Tabs (Furosemide) .... 1/2 once daily 5)  Potassium Chloride Crys Cr 20 Meq Cr-tabs (Potassium chloride crys cr) .... Take 1/2 tablet by mouth once a day 6)   Simvastatin 20 Mg Tabs (Simvastatin) .... Take one tablet by mouth daily at bedtime 7)  Nexium 40 Mg Cpdr (Esomeprazole magnesium) .... One by mouth daily as needed 8)  Centrum Silver Tabs (Multiple vitamins-minerals) .... Take 1 tablet by mouth once a day 9)  Carvedilol 3.125 Mg Tabs (Carvedilol) .... One by mouth two times a day  Hypertension Assessment/Plan:      The patient's hypertensive risk group is category C: Target organ damage and/or diabetes.  Today's blood pressure is 130/80.  His blood pressure goal is < 140/90.  Patient Instructions: 1)  PT IS CLEARED FOR KNEE REPLACEMENT 2)  HE WILL NEED TO HOLD THE  ASPIRIN 5 DAYS PRIOR TO SURGERY 3)  wILL NEED TO BE ON  LOVENOX AND COUMADIN AFTERWARDS 4)  WE CAN MONITER THE pt FOR THE PT ( [facility] OR VISITS IF AMBULATORY) 5)  Please schedule a follow-up appointment in 3 months. Prescriptions: CARVEDILOL 3.125 MG TABS (CARVEDILOL) one by mouth two times a day  #180 x 3   Entered and Authorized by:   Stacie Glaze MD   Signed by:   Stacie Glaze MD on 10/17/2009   Method used:   Electronically to        [facility] [phone]* (retail)       [address] [place], [place]  [address]       Ph: [phone] or [phone]       Fax: [phone]   RxID:   [id number]

## 2010-07-24 NOTE — Assessment & Plan Note (Signed)
Summary: 3 month rov/njr pt rsc/njr   Vital Signs:  Patient profile:   77 year old male Height:      74 inches Weight:      187 pounds BMI:     24.10 Temp:     98.2 degrees F oral Pulse rate:   64 / minute Resp:     14 per minute BP sitting:   132 / 72  (left arm)  Vitals Entered By: Willy Eddy, LPN (June 25, 2009 11:42 AM) CC: roa labs-c/o knee pain, Hypertension Management   Primary Care Provider:  Stacie Glaze MD  CC:  roa labs-c/o knee pain and Hypertension Management.  History of Present Illness: Has an appointment with [facility] in march for echo to assess function review of lipids, liver   stable review of PSA at low risk number discusson of need to keep hydrated with slight bump in  bun primary complaint is knee pain last injection was over 4 months ago has hx of severe knee arthrithis with need for possible tkr in the future has a AK on the right pinna that is crusting and will not head  Hypertension History:      He denies headache, chest pain, palpitations, dyspnea with exertion, orthopnea, PND, peripheral edema, visual symptoms, neurologic problems, syncope, and side effects from treatment.  feels good.        Positive major cardiovascular risk factors include male age 57 years old or older, hyperlipidemia, and hypertension.  Negative major cardiovascular risk factors include non-tobacco-user status.        Positive history for target organ damage include ASHD (either angina/prior MI/prior CABG) and peripheral vascular disease.     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) 7)   Hypertension  (ICD-401.9) 8)  Hypercholesterolemia  (ICD-272.0) 9)  Pvd  (ICD-443.9) 10)  Deep Venous Thrombophlebitis, Hx of  (ICD-V12.52) 11)  Encounter For Long-term Use of Other Medications  (ICD-V58.69) 12)  Respiratory Failure  (ICD-518.81) 13)  Gerd  (ICD-530.81) 14)  Benign Prostatic Hypertrophy, With Urinary Obstruction  (ICD-600.01) 15)  Advef, Drug/medicinal/biological Subst Nos  (ICD-995.20) 16)  Hypothyroidism  (ICD-244.9) 17)  Family History of Colon Ca 1st Degree Relative <60  (ICD-V16.0)  Medications Prior to Update: 1)  Levothyroxine Sodium 50 Mcg Tabs (Levothyroxine Sodium) .... One By Mouth Daily 2)  Bayer Aspirin 325 Mg  Tabs (Aspirin) .... Once Daily 3)  Flomax 0.4 Mg Xr24h-Cap (Tamsulosin Hcl) .... Take 1 Capsule By Mouth Once A Day 4)  Lasix 40 Mg Tabs (Furosemide) .... 1/2 Once Daily 5)  Potassium Chloride Crys Cr 20 Meq Cr-Tabs (Potassium Chloride Crys Cr) .... Take 1/2 Tablet By Mouth Once A Day 6)  Lopressor 6.24ml Suspension .... Two Times A Day 7)  Simvastatin 20 Mg Tabs (Simvastatin) .... Take One Tablet By Mouth Daily At Bedtime 8)  Nexium 40 Mg Cpdr (Esomeprazole Magnesium) .... One By Mouth Daily As Needed 9)  Centrum Silver  Tabs (Multiple Vitamins-Minerals) .... Take 1 Tablet By Mouth Once A Day  Current Medications (verified): 1)  Levothyroxine Sodium 50 Mcg Tabs (Levothyroxine Sodium) .... One By Mouth Daily 2)  Bayer Aspirin 325 Mg  Tabs (Aspirin) .... Once Daily 3)  Flomax 0.4 Mg Xr24h-Cap (Tamsulosin Hcl) .... Take 1 Capsule By Mouth Once A Day 4)  Lasix 40 Mg Tabs (Furosemide) .... 1/2 Once Daily 5)  Potassium Chloride Crys Cr 20 Meq Cr-Tabs (Potassium Chloride Crys Cr) .... Take 1/2 Tablet By Mouth Once A Day 6)  Lopressor 6.70ml Suspension .... Two Times A Day 7)  Simvastatin 20 Mg Tabs (Simvastatin) .... Take One Tablet By Mouth Daily At Bedtime 8)  Nexium 40 Mg Cpdr (Esomeprazole Magnesium) .... One By Mouth Daily As Needed 9)  Centrum  Silver  Tabs (Multiple Vitamins-Minerals) .... Take 1 Tablet By Mouth Once A Day  Allergies (verified): 1)  ! Pcn 2)  ! Cozaar 3)  ! Naprosyn  Past History:  Family History: Last updated: 11/17/2008  Positive CAD.      Social History: Last updated: 11/17/2008  Married, retired.  Wife can assist as needed.  Daughter   can also assist, one-level home and one step to enter.    Nonsmoker  Risk Factors: Smoking Status: never (06/25/2009)  Past medical, surgical, family and social histories (including risk factors) reviewed, and no changes noted (except as noted below).  Past Medical History: Reviewed history from 11/17/2008 and no changes required. Current Problems:  CAD, ARTERY BYPASS GRAFT (ICD-414.04) MYOCARDIAL INFARCTION, INFEROPOSTERIOR WALL, SUBSEQUENT CARE (ICD-410.32) HYPERTENSION (ICD-401.9) HYPERCHOLESTEROLEMIA (ICD-272.0) PVD (ICD-443.9) DEEP VENOUS THROMBOPHLEBITIS, HX OF (ICD-V12.52) ENCOUNTER FOR LONG-TERM USE OF OTHER MEDICATIONS (ICD-V58.69) RESPIRATORY FAILURE (ICD-518.81) GERD (ICD-530.81) BENIGN PROSTATIC HYPERTROPHY, WITH URINARY OBSTRUCTION (ICD-600.01) ADVEF, DRUG/MEDICINAL/BIOLOGICAL SUBST NOS (ICD-995.20) HYPOTHYROIDISM (ICD-244.9) FAMILY HISTORY OF COLON CA 1ST DEGREE RELATIVE <60 (ICD-V16.0)  Past Surgical History: Reviewed history from 11/17/2008 and no changes required. Tonsillectomy Knee Surgery coronary  artery bypass graft x5 on August 29, 2008.   tracheostomy placement       Family History: Reviewed history from 11/17/2008 and no changes required.  Positive CAD.      Social History: Reviewed history from 11/17/2008 and no changes required.  Married, retired.  Wife can assist as needed.  Daughter   can also assist, one-level home and one step to enter.    Nonsmoker  Review of Systems  The patient denies anorexia, fever, weight loss, weight gain, vision loss, decreased hearing, hoarseness, chest pain, syncope, dyspnea on exertion,  peripheral edema, prolonged cough, headaches, hemoptysis, abdominal pain, melena, hematochezia, severe indigestion/heartburn, hematuria, incontinence, genital sores, muscle weakness, suspicious skin lesions, transient blindness, difficulty walking, depression, unusual weight change, abnormal bleeding, enlarged lymph nodes, angioedema, and breast masses.    Physical Exam  General:  Well developed, well nourished, in no acute distress. Head:  atraumatic and male-pattern balding.   Eyes:  pupils equal and pupils round.   Ears:  AK ON TOP OF RIGHT PINNA Mouth:  good dentition and pharynx pink and moist.   Neck:  No deformities, masses, or tenderness noted. Lungs:  normal respiratory effort and no wheezes.   Heart:  regular rhythm, no gallop, and no rub.   Abdomen:  soft, non-tender, and no distention.   Msk:  decreased ROM and joint swelling.   Pulses:  R and L carotid,radial,femoral,dorsalis pedis and posterior tibial pulses are full and equal bilaterally Extremities:  trace left pedal edema and trace right pedal edema.   Neurologic:  alert & oriented X3 and finger-to-nose normal.     Impression & Recommendations:  Problem # 1:  HYPERTENSION (ICD-401.9) Assessment Unchanged  His updated medication list for this  problem includes:    Lasix 40 Mg Tabs (Furosemide) .Marland Kitchen... 1/2 once daily  BP today: 132/72 Prior BP: 131/68 (05/22/2009)  Prior 10 Yr Risk Heart Disease: N/A (03/13/2009)  Labs Reviewed: K+: 4.3 (06/11/2009) Creat: : 1.3 (06/11/2009)   Chol: 121 (06/11/2009)   HDL: 36.40 (06/11/2009)   LDL: 62 (06/11/2009)   TG: 115.0 (06/11/2009)  Problem # 2:  HYPERCHOLESTEROLEMIA (ICD-272.0) Assessment: Unchanged improvement His updated medication list for this problem includes:    Simvastatin 20 Mg Tabs (Simvastatin) .Marland Kitchen... Take one tablet by mouth daily at bedtime  Labs Reviewed: SGOT: 25 (06/11/2009)   SGPT: 18 (06/11/2009)  Prior 10 Yr Risk Heart Disease: N/A (03/13/2009)    HDL:36.40 (06/11/2009), 35.80 (01/03/2009)  LDL:62 (06/11/2009), 62 (01/03/2009)  Chol:121 (06/11/2009), 123 (01/03/2009)  Trig:115.0 (06/11/2009), 128.0 (01/03/2009)  Problem # 3:  GERD (ICD-530.81) Assessment: Unchanged  His updated medication list for this problem includes:    Nexium 40 Mg Cpdr (Esomeprazole magnesium) ..... One by mouth daily as needed  Labs Reviewed: Hgb: 13.3 (11/03/2008)   Hct: 38.4 (11/03/2008)  Problem # 4:  HYPOTHYROIDISM (ICD-244.9) Assessment: Unchanged  His updated medication list for this problem includes:    Levothyroxine Sodium 50 Mcg Tabs (Levothyroxine sodium) ..... One by mouth daily  Labs Reviewed: TSH: 4.69 (11/03/2008)    Chol: 121 (06/11/2009)   HDL: 36.40 (06/11/2009)   LDL: 62 (06/11/2009)   TG: 115.0 (06/11/2009)  Problem # 5:  LOC OSTEOARTHROS NOT SPEC PRIM/SEC LOWER LEG (ICD-715.36) Assessment: New  Informed consen obtained and then the joint was prepped in a sterile manor and 40 mg depo and 1/2 cc 1% lidocaine injected into the synovial space. After care discussed. Pt tolerated procedure well.  His updated medication list for this problem includes:    Bayer Aspirin 325 Mg Tabs (Aspirin) ..... Once daily  Orders: Joint Aspirate / Injection, Large (20610) Depo- Medrol 40mg  (J1030)  Discussed use of medications, application of heat or cold, and exercises.   Problem # 6:  ACTINIC KERATOSIS (ICD-702.0) Assessment: New  the lesion was identifies as a      ak    and 40 seconds of cryotherapy with the liguid nitrogen gun was apllied to the site. The pt tolerated the procedure and post procedure care was discussed  Orders: Cryotherapy/Destruction benign or premalignant lesion (1st lesion)  (17000)  Complete Medication List: 1)  Levothyroxine Sodium 50 Mcg Tabs (Levothyroxine sodium) .... One by mouth daily 2)  Bayer Aspirin 325 Mg Tabs (Aspirin) .... Once daily 3)  Flomax 0.4 Mg Xr24h-cap (Tamsulosin hcl) .... Take 1 capsule by mouth  once a day 4)  Lasix 40 Mg Tabs (Furosemide) .... 1/2 once daily 5)  Potassium Chloride Crys Cr 20 Meq Cr-tabs (Potassium chloride crys cr) .... Take 1/2 tablet by mouth once a day 6)  Lopressor 6.31ml Suspension  .... Two times a day 7)  Simvastatin 20 Mg Tabs (Simvastatin) .... Take one tablet by mouth daily at bedtime 8)  Nexium 40 Mg Cpdr (Esomeprazole magnesium) .... One by mouth daily as needed 9)  Centrum Silver Tabs (Multiple vitamins-minerals) .... Take 1 tablet by mouth once a day  Hypertension Assessment/Plan:      The patient's hypertensive risk group is category C: Target organ damage and/or diabetes.  Today's blood pressure is 132/72.  His blood pressure goal is < 140/90.  Patient Instructions: 1)  Please schedule a follow-up appointment in 3 months.

## 2010-07-24 NOTE — Assessment & Plan Note (Signed)
Summary: 3 month rov/njr pt rsc/njr   Vital Signs:  Patient profile:   77 year old male Height:      74 inches Weight:      191 pounds BMI:     24.61 Temp:     98.2 degrees F oral Pulse rate:   68 / minute Resp:     14 per minute BP sitting:   122 / 70  (left arm)  Vitals Entered By: Willy Eddy, LPN (April 22, 2010 11:21 AM) CC: roa- dr Riley Kill added atacand 8 mg qd, Hypertension Management Is Patient Diabetic? No   Primary Care Provider:  Stacie Glaze MD  CC:  roa- dr Riley Kill added atacand 8 mg qd and Hypertension Management.  History of Present Illness: pt has fatigue and saw cardiology CBC and Bmet checked The pt was seen  and placed on atacand and taken off the potassium the thyroid levels were not checked but review of the chart showed TSH was checked 7/27 ( see flow sheet) the pt is mildly anemia the pt lipids were also check in july at goals ( he is off the niacin) weight stABLE NOTE ATACAND IS NOT COVERED  Hypertension History:      He complains of dyspnea with exertion, but denies headache, palpitations, peripheral edema, visual symptoms, neurologic problems, and syncope.  no edema, very mild exertiona fatigue mild persistant hoarseness.        Positive major cardiovascular risk factors include male age 40 years old or older, hyperlipidemia, and hypertension.  Negative major cardiovascular risk factors include non-tobacco-user status.        Positive history for target organ damage include ASHD (either angina/prior MI/prior CABG) and peripheral vascular disease.     Preventive Screening-Counseling & Management  Alcohol-Tobacco     Smoking Status: never     Tobacco Counseling: not indicated; no tobacco use  Problems Prior to Update: 1)  Cardiomyopathy, Ischemic  (ICD-414.8) 2)  Actinic Keratosis  (ICD-702.0) 3)  Loc Osteoarthros Not Spec Prim/sec Lower Leg  (ICD-715.36) 4)  Basal Cell Carcinoma, Nose  (ICD-173.3) 5)  Hoarseness  (ICD-784.49) 6)   Osteoarthritis, Lower Leg, Left  (ICD-715.36) 7)  Coronary Artery Bypass Graft, Hx of  (ICD-V45.81) 8)  Cad, Artery Bypass Graft  (ICD-414.04) 9)  Myocardial Infarction, Inferoposterior Wall, 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) 10)  Hypertension  (ICD-401.9) 11)  Hypercholesterolemia  (ICD-272.0) 12)  Pvd  (ICD-443.9) 13)  Deep Venous Thrombophlebitis, Hx of  (ICD-V12.52) 14)  Encounter For Long-term Use of Other Medications  (ICD-V58.69) 15)  Respiratory Failure  (ICD-518.81) 16)  Gerd  (ICD-530.81) 17)  Benign Prostatic Hypertrophy, With Urinary Obstruction  (ICD-600.01) 18)  Advef, Drug/medicinal/biological Subst Nos  (ICD-995.20) 19)  Hypothyroidism  (ICD-244.9) 20)  Family History of Colon Ca 1st Degree Relative <60  (ICD-V16.0)  Current Problems (verified): 1)  Cardiomyopathy, Ischemic  (ICD-414.8) 2)  Actinic Keratosis  (ICD-702.0) 3)  Loc Osteoarthros Not Spec Prim/sec Lower Leg  (ICD-715.36) 4)  Basal Cell Carcinoma, Nose  (ICD-173.3) 5)  Hoarseness  (ICD-784.49) 6)  Osteoarthritis, Lower Leg, Left  (ICD-715.36) 7)  Coronary Artery Bypass Graft, Hx of  (ICD-V45.81) 8)  Cad, Artery Bypass Graft  (ICD-414.04) 9)  Myocardial Infarction, Inferoposterior Wall, 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) 10)  Hypertension  (ICD-401.9) 11)  Hypercholesterolemia  (ICD-272.0) 12)  Pvd  (ICD-443.9) 13)  Deep Venous Thrombophlebitis, Hx of  (ICD-V12.52) 14)  Encounter For Long-term Use of Other Medications  (ICD-V58.69) 15)  Respiratory Failure  (ICD-518.81)  16)  Gerd  (ICD-530.81) 17)  Benign Prostatic Hypertrophy, With Urinary Obstruction  (ICD-600.01) 18)  Advef, Drug/medicinal/biological Subst Nos  (ICD-995.20) 19)  Hypothyroidism  (ICD-244.9) 20)  Family History of Colon Ca 1st Degree Relative <60  (ICD-V16.0)  Medications Prior to Update: 1)  Levothyroxine Sodium 50 Mcg Tabs (Levothyroxine Sodium) .... One By Mouth Daily 2)  Aspirin 81 Mg Tbec (Aspirin) .... Take One Tablet By Mouth Daily 3)   Flomax 0.4 Mg Xr24h-Cap (Tamsulosin Hcl) .... Take 1 Capsule By Mouth Once A Day 4)  Simvastatin 20 Mg Tabs (Simvastatin) .... Take One Tablet By Mouth Daily At Bedtime 5)  Nexium 40 Mg Cpdr (Esomeprazole Magnesium) .... One By Mouth Daily As Needed 6)  Centrum Silver  Tabs (Multiple Vitamins-Minerals) .... Take 1 Tablet By Mouth Once A Day 7)  Carvedilol 3.125 Mg Tabs (Carvedilol) .... One By Mouth Two Times A Day 8)  Atacand 8 Mg Tabs (Candesartan Cilexetil) .... Take One By Mouth Daily  Current Medications (verified): 1)  Levothyroxine Sodium 50 Mcg Tabs (Levothyroxine Sodium) .... One By Mouth Daily 2)  Aspirin 81 Mg Tbec (Aspirin) .... Take One Tablet By Mouth Daily 3)  Flomax 0.4 Mg Xr24h-Cap (Tamsulosin Hcl) .... Take 1 Capsule By Mouth Once A Day 4)  Simvastatin 20 Mg Tabs (Simvastatin) .... Take One Tablet By Mouth Daily At Bedtime 5)  Nexium 40 Mg Cpdr (Esomeprazole Magnesium) .... One By Mouth Daily As Needed 6)  Centrum Silver  Tabs (Multiple Vitamins-Minerals) .... Take 1 Tablet By Mouth Once A Day 7)  Carvedilol 3.125 Mg Tabs (Carvedilol) .... One By Mouth Two Times A Day 8)  Atacand 8 Mg Tabs (Candesartan Cilexetil) .... Take One By Mouth Daily  Allergies (verified): 1)  ! Pcn 2)  ! Cozaar 3)  ! Naprosyn  Past History:  Family History: Last updated: 11/17/2008  Positive CAD.      Social History: Last updated: 11/17/2008  Married, retired.  Wife can assist as needed.  Daughter   can also assist, one-level home and one step to enter.    Nonsmoker  Risk Factors: Smoking Status: never (04/22/2010)  Past medical, surgical, family and social histories (including risk factors) reviewed, and no changes noted (except as noted below).  Past Medical History: Reviewed history from 11/17/2008 and no changes required. Current Problems:  CAD, ARTERY BYPASS GRAFT (ICD-414.04) MYOCARDIAL INFARCTION, INFEROPOSTERIOR WALL, SUBSEQUENT CARE (ICD-410.32) HYPERTENSION  (ICD-401.9) HYPERCHOLESTEROLEMIA (ICD-272.0) PVD (ICD-443.9) DEEP VENOUS THROMBOPHLEBITIS, HX OF (ICD-V12.52) ENCOUNTER FOR LONG-TERM USE OF OTHER MEDICATIONS (ICD-V58.69) RESPIRATORY FAILURE (ICD-518.81) GERD (ICD-530.81) BENIGN PROSTATIC HYPERTROPHY, WITH URINARY OBSTRUCTION (ICD-600.01) ADVEF, DRUG/MEDICINAL/BIOLOGICAL SUBST NOS (ICD-995.20) HYPOTHYROIDISM (ICD-244.9) FAMILY HISTORY OF COLON CA 1ST DEGREE RELATIVE <60 (ICD-V16.0)  Past Surgical History: Reviewed history from 11/17/2008 and no changes required. Tonsillectomy Knee Surgery coronary  artery bypass graft x5 on August 29, 2008.   tracheostomy placement       Family History: Reviewed history from 11/17/2008 and no changes required.  Positive CAD.      Social History: Reviewed history from 11/17/2008 and no changes required.  Married, retired.  Wife can assist as needed.  Daughter   can also assist, one-level home and one step to enter.    Nonsmoker  Review of Systems       The patient complains of hoarseness.  The patient denies anorexia, fever, weight loss, weight gain, vision loss, decreased hearing, chest pain, syncope, dyspnea on exertion, peripheral edema, prolonged cough, headaches, hemoptysis, abdominal pain, melena, hematochezia, severe indigestion/heartburn,  hematuria, incontinence, genital sores, muscle weakness, suspicious skin lesions, transient blindness, difficulty walking, depression, unusual weight change, abnormal bleeding, enlarged lymph nodes, angioedema, breast masses, and testicular masses.    Physical Exam  General:  alert, well-developed, and pale.   Head:  normocephalic and atraumatic.   Eyes:  pupils equal and pupils round.   Ears:  R ear normal and L ear normal.   Nose:  no external deformity and no nasal discharge.   Mouth:  good dentition and pharynx pink and moist.   Neck:  No deformities, masses, or tenderness noted. Lungs:  normal respiratory effort and no wheezes.   Heart:   regular rhythm, no gallop, and no rub.  heart sounds are reduced  pulse  was 70  Abdomen:  soft and no masses.     Impression & Recommendations:  Problem # 1:  CARDIOMYOPATHY, ISCHEMIC (ICD-414.8)  His updated medication list for this problem includes:    Aspirin 81 Mg Tbec (Aspirin) .Marland Kitchen... Take one tablet by mouth daily    Carvedilol 3.125 Mg Tabs (Carvedilol) ..... One by mouth two times a day    Atacand 8 Mg Tabs (Candesartan cilexetil) .Marland Kitchen... Take one by mouth daily  Labs Reviewed: Chol: 142 (01/16/2010)   HDL: 39.50 (01/16/2010)   LDL: 62 (06/11/2009)   TG: 115.0 (06/11/2009)  Problem # 2:  HOARSENESS (ICD-784.49) due to scaring from trach  Problem # 3:  HYPERTENSION (ICD-401.9)  ATACANDD IS NOT COVERED moniter potassium His updated medication list for this problem includes:    Carvedilol 3.125 Mg Tabs (Carvedilol) ..... One by mouth two times a day    Atacand 8 Mg Tabs (Candesartan cilexetil) .Marland Kitchen... Take one by mouth daily  BP today: 122/70 Prior BP: 149/80 (04/17/2010)  Prior 10 Yr Risk Heart Disease: N/A (03/13/2009)  Labs Reviewed: K+: 4.3 (04/17/2010) Creat: : 1.2 (04/17/2010)   Chol: 142 (01/16/2010)   HDL: 39.50 (01/16/2010)   LDL: 62 (06/11/2009)   TG: 115.0 (06/11/2009)  Orders: Venipuncture (16109) TLB-BMP (Basic Metabolic Panel-BMET) (80048-METABOL) Specimen Handling (60454)  Problem # 4:  ANEMIA, VITAMIN B12 DEFICIENCY (ICD-281.1)  Hgb: 13.4 (04/17/2010)   Hct: 38.3 (04/17/2010)   Platelets: 245.0 (04/17/2010) RBC: 3.90 (04/17/2010)   RDW: 12.9 (04/17/2010)   WBC: 8.9 (04/17/2010) MCV: 98.2 (04/17/2010)   MCHC: 34.9 (04/17/2010) TSH: 2.20 (01/16/2010)  Problem # 5:  HYPOTHYROIDISM (ICD-244.9) Assessment: Unchanged STABLE His updated medication list for this problem includes:    Levothyroxine Sodium 50 Mcg Tabs (Levothyroxine sodium) ..... One by mouth daily  Labs Reviewed: TSH: 2.20 (01/16/2010)    Chol: 142 (01/16/2010)   HDL: 39.50 (01/16/2010)    LDL: 62 (06/11/2009)   TG: 115.0 (06/11/2009)  Complete Medication List: 1)  Levothyroxine Sodium 50 Mcg Tabs (Levothyroxine sodium) .... One by mouth daily 2)  Aspirin 81 Mg Tbec (Aspirin) .... Take one tablet by mouth daily 3)  Flomax 0.4 Mg Xr24h-cap (Tamsulosin hcl) .... Take 1 capsule by mouth once a day 4)  Simvastatin 20 Mg Tabs (Simvastatin) .... Take one tablet by mouth daily at bedtime 5)  Nexium 40 Mg Cpdr (Esomeprazole magnesium) .... One by mouth daily as needed 6)  Centrum Silver Tabs (Multiple vitamins-minerals) .... Take 1 tablet by mouth once a day 7)  Carvedilol 3.125 Mg Tabs (Carvedilol) .... One by mouth two times a day 8)  Atacand 8 Mg Tabs (Candesartan cilexetil) .... Take one by mouth daily 9)  Folbee Plus Tabs (B complex-c-folic acid) .... One by mouth daily  Hypertension  Assessment/Plan:      The patient's hypertensive risk group is category C: Target organ damage and/or diabetes.  Today's blood pressure is 122/70.  His blood pressure goal is < 140/90.  Patient Instructions: 1)  Please schedule a follow-up appointment in 3 months.   Orders Added: 1)  Venipuncture [36415] 2)  TLB-BMP (Basic Metabolic Panel-BMET) [80048-METABOL] 3)  Est. Patient Level IV [78469] 4)  Specimen Handling [99000]

## 2010-07-24 NOTE — Assessment & Plan Note (Signed)
Summary: 3 MONTH FUP//CCM   Vital Signs:  Patient profile:   77 year old male Height:      74 inches Weight:      188 pounds BMI:     24.23 Temp:     98.2 degrees F oral Pulse rate:   68 / minute Resp:     14 per minute BP sitting:   135 / 80  (left arm)  Vitals Entered By: Willy Eddy, LPN (January 16, 2010 9:27 AM) CC: roa, Hypertension Management Is Patient Diabetic? No   Primary Care Provider:  Stacie Glaze MD  CC:  roa and Hypertension Management.  History of Present Illness: the pt present for follow up for HTN his knee replacement is doing well the pt wants generic for thyroid  Hypertension History:      He denies headache, chest pain, palpitations, dyspnea with exertion, orthopnea, PND, peripheral edema, visual symptoms, neurologic problems, syncope, and side effects from treatment.  stable.        Positive major cardiovascular risk factors include male age 13 years old or older, hyperlipidemia, and hypertension.  Negative major cardiovascular risk factors include non-tobacco-user status.        Positive history for target organ damage include ASHD (either angina/prior MI/prior CABG) and peripheral vascular disease.     Preventive Screening-Counseling & Management  Alcohol-Tobacco     Smoking Status: never  Current Problems (verified): 1)  Actinic Keratosis  (ICD-702.0) 2)  Loc Osteoarthros Not Spec Prim/sec Lower Leg  (ICD-715.36) 3)  Basal Cell Carcinoma, Nose  (ICD-173.3) 4)  Hoarseness  (ICD-784.49) 5)  Osteoarthritis, Lower Leg, Left  (ICD-715.36) 6)  Coronary Artery Bypass Graft, Hx of  (ICD-V45.81) 7)  Cad, Artery Bypass Graft  (ICD-414.04) 8)  Myocardial Infarction, Inferoposterior Wall, 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) 9)  Hypertension  (ICD-401.9) 10)  Hypercholesterolemia  (ICD-272.0) 11)  Pvd  (ICD-443.9) 12)  Deep Venous Thrombophlebitis, Hx of  (ICD-V12.52) 13)  Encounter For Long-term Use of Other Medications  (ICD-V58.69) 14)  Respiratory  Failure  (ICD-518.81) 15)  Gerd  (ICD-530.81) 16)  Benign Prostatic Hypertrophy, With Urinary Obstruction  (ICD-600.01) 17)  Advef, Drug/medicinal/biological Subst Nos  (ICD-995.20) 18)  Hypothyroidism  (ICD-244.9) 19)  Family History of Colon Ca 1st Degree Relative <60  (ICD-V16.0)  Current Medications (verified): 1)  Levothyroxine Sodium 50 Mcg Tabs (Levothyroxine Sodium) .... One By Mouth Daily 2)  Bayer Aspirin 325 Mg  Tabs (Aspirin) .... Once Daily 3)  Flomax 0.4 Mg Xr24h-Cap (Tamsulosin Hcl) .... Take 1 Capsule By Mouth Once A Day 4)  Lasix 40 Mg Tabs (Furosemide) .... 1/2 Once Daily 5)  Potassium Chloride Crys Cr 20 Meq Cr-Tabs (Potassium Chloride Crys Cr) .... Take 1/2 Tablet By Mouth Once A Day 6)  Simvastatin 20 Mg Tabs (Simvastatin) .... Take One Tablet By Mouth Daily At Bedtime 7)  Nexium 40 Mg Cpdr (Esomeprazole Magnesium) .... One By Mouth Daily As Needed 8)  Centrum Silver  Tabs (Multiple Vitamins-Minerals) .... Take 1 Tablet By Mouth Once A Day 9)  Carvedilol 3.125 Mg Tabs (Carvedilol) .... One By Mouth Two Times A Day  Allergies (verified): 1)  ! Pcn 2)  ! Cozaar 3)  ! Naprosyn  Past History:  Family History: Last updated: 11/17/2008  Positive CAD.      Social History: Last updated: 11/17/2008  Married, retired.  Wife can assist as needed.  Daughter   can also assist, one-level home and one step to enter.  Nonsmoker  Risk Factors: Smoking Status: never (01/16/2010)  Past medical, surgical, family and social histories (including risk factors) reviewed, and no changes noted (except as noted below).  Past Medical History: Reviewed history from 11/17/2008 and no changes required. Current Problems:  CAD, ARTERY BYPASS GRAFT (ICD-414.04) MYOCARDIAL INFARCTION, INFEROPOSTERIOR WALL, SUBSEQUENT CARE (ICD-410.32) HYPERTENSION (ICD-401.9) HYPERCHOLESTEROLEMIA (ICD-272.0) PVD (ICD-443.9) DEEP VENOUS THROMBOPHLEBITIS, HX OF (ICD-V12.52) ENCOUNTER FOR LONG-TERM  USE OF OTHER MEDICATIONS (ICD-V58.69) RESPIRATORY FAILURE (ICD-518.81) GERD (ICD-530.81) BENIGN PROSTATIC HYPERTROPHY, WITH URINARY OBSTRUCTION (ICD-600.01) ADVEF, DRUG/MEDICINAL/BIOLOGICAL SUBST NOS (ICD-995.20) HYPOTHYROIDISM (ICD-244.9) FAMILY HISTORY OF COLON CA 1ST DEGREE RELATIVE <60 (ICD-V16.0)  Past Surgical History: Reviewed history from 11/17/2008 and no changes required. Tonsillectomy Knee Surgery coronary  artery bypass graft x5 on August 29, 2008.   tracheostomy placement       Family History: Reviewed history from 11/17/2008 and no changes required.  Positive CAD.      Social History: Reviewed history from 11/17/2008 and no changes required.  Married, retired.  Wife can assist as needed.  Daughter   can also assist, one-level home and one step to enter.    Nonsmoker  Review of Systems  The patient denies anorexia, fever, weight loss, weight gain, vision loss, decreased hearing, hoarseness, chest pain, syncope, dyspnea on exertion, peripheral edema, prolonged cough, headaches, hemoptysis, abdominal pain, melena, hematochezia, severe indigestion/heartburn, hematuria, incontinence, genital sores, muscle weakness, suspicious skin lesions, transient blindness, difficulty walking, depression, unusual weight change, abnormal bleeding, enlarged lymph nodes, angioedema, breast masses, and testicular masses.    Physical Exam  General:  alert, well-developed, and pale.   Head:  normocephalic and atraumatic.   Eyes:  pupils equal and pupils round.   Ears:  R ear normal and L ear normal.   Nose:  no external deformity and no nasal discharge.   Mouth:  good dentition and pharynx pink and moist.   Neck:  No deformities, masses, or tenderness noted. Lungs:  normal respiratory effort and no wheezes.   Heart:  regular rhythm, no gallop, and no rub.  heart sounds are reduced  pulse  was 70  Abdomen:  soft and no masses.   Msk:  normal ROM, no joint tenderness, and joint swelling.    Extremities:  trace left pedal edema and trace right pedal edema.   Neurologic:  alert & oriented X3 and abnormal gait.     Impression & Recommendations:  Problem # 1:  HYPOTHYROIDISM (ICD-244.9) Assessment Unchanged  His updated medication list for this problem includes:    Levothyroxine Sodium 50 Mcg Tabs (Levothyroxine sodium) ..... One by mouth daily  Labs Reviewed: TSH: 4.69 (11/03/2008)    Chol: 121 (06/11/2009)   HDL: 36.40 (06/11/2009)   LDL: 62 (06/11/2009)   TG: 115.0 (06/11/2009)  Problem # 2:  HYPERCHOLESTEROLEMIA (ICD-272.0) Assessment: Unchanged discussion of the recent datat of simvastatin and review of potential interactions His updated medication list for this problem includes:    Simvastatin 20 Mg Tabs (Simvastatin) .Marland Kitchen... Take one tablet by mouth daily at bedtime  Labs Reviewed: SGOT: 25 (06/11/2009)   SGPT: 18 (06/11/2009)  Prior 10 Yr Risk Heart Disease: N/A (03/13/2009)   HDL:36.40 (06/11/2009), 35.80 (01/03/2009)  LDL:62 (06/11/2009), 62 (01/03/2009)  Chol:121 (06/11/2009), 123 (01/03/2009)  Trig:115.0 (06/11/2009), 128.0 (01/03/2009)  Orders: Specimen Handling (29528) TLB-Cholesterol, Direct LDL (83721-DIRLDL) TLB-Cholesterol, Total (82465-CHO) TLB-Cholesterol, HDL (83718-HDL) TLB-TSH (Thyroid Stimulating Hormone) (84443-TSH)  Problem # 3:  HOARSENESS (ICD-784.49) Assessment: Unchanged  persistant following his stroke  Problem # 4:  HYPERTENSION (ICD-401.9) Assessment:  Unchanged  His updated medication list for this problem includes:    Lasix 40 Mg Tabs (Furosemide) .Marland Kitchen... 1/2 once daily    Carvedilol 3.125 Mg Tabs (Carvedilol) ..... One by mouth two times a day  Orders: Specimen Handling (16109) TLB-BMP (Basic Metabolic Panel-BMET) (80048-METABOL)  BP today: 140/80 Prior BP: 130/80 (10/17/2009)  Prior 10 Yr Risk Heart Disease: N/A (03/13/2009)  Labs Reviewed: K+: 4.3 (06/11/2009) Creat: : 1.3 (06/11/2009)   Chol: 121 (06/11/2009)    HDL: 36.40 (06/11/2009)   LDL: 62 (06/11/2009)   TG: 115.0 (06/11/2009)  Complete Medication List: 1)  Levothyroxine Sodium 50 Mcg Tabs (Levothyroxine sodium) .... One by mouth daily 2)  Bayer Aspirin 325 Mg Tabs (Aspirin) .... Once daily 3)  Flomax 0.4 Mg Xr24h-cap (Tamsulosin hcl) .... Take 1 capsule by mouth once a day 4)  Lasix 40 Mg Tabs (Furosemide) .... 1/2 once daily 5)  Potassium Chloride Crys Cr 20 Meq Cr-tabs (Potassium chloride crys cr) .... Take 1/2 tablet by mouth once a day 6)  Simvastatin 20 Mg Tabs (Simvastatin) .... Take one tablet by mouth daily at bedtime 7)  Nexium 40 Mg Cpdr (Esomeprazole magnesium) .... One by mouth daily as needed 8)  Centrum Silver Tabs (Multiple vitamins-minerals) .... Take 1 tablet by mouth once a day 9)  Carvedilol 3.125 Mg Tabs (Carvedilol) .... One by mouth two times a day  Other Orders: Venipuncture (60454) TLB-Hepatic/Liver Function Pnl (80076-HEPATIC)  Hypertension Assessment/Plan:      The patient's hypertensive risk group is category C: Target organ damage and/or diabetes.  Today's blood pressure is 135/80.  His blood pressure goal is < 140/90.  Patient Instructions: 1)  Please schedule a follow-up appointment in 3 months. Prescriptions: NEXIUM 40 MG CPDR (ESOMEPRAZOLE MAGNESIUM) one by mouth daily as needed  #30 x 11   Entered and Authorized by:   Stacie Glaze MD   Signed by:   Stacie Glaze MD on 01/16/2010   Method used:   Electronically to        [facility] [phone]* (retail)       [address] [place], [place]  [address]       Ph: [phone] or [phone]       Fax: [phone]   RxID:   [id number] LEVOTHYROXINE SODIUM 50 MCG TABS (LEVOTHYROXINE SODIUM) one by mouth daily  #30 x 11   Entered and Authorized by:   Stacie Glaze MD   Signed by:   Stacie Glaze MD on 01/16/2010   Method used:   [facility]ronically to        [facility] [phone]* (retail)       [address] [place], [place]  [address]        Ph: [phone] or [phone]       Fax: [phone]   RxID:   [id number] NEXIUM 40 MG CPDR (ESOMEPRAZOLE MAGNESIUM) one by mouth daily as needed  #30 x 6   Entered by:   Willy Eddy, LPN   Authorized by:   Stacie Glaze MD   Signed by:   Willy Eddy, LPN on 83/15/1761   Method used:   Electronically to        [facility] [phone]* (retail)       [address] [place], [place]  [address]  Ph: [phone] or [phone]       Fax: [phone]   RxID:   [id number] NEXIUM 40 MG CPDR (ESOMEPRAZOLE MAGNESIUM) one by mouth daily as needed  #0 x 6   Entered by:   Willy Eddy, LPN   Authorized by:   Stacie Glaze MD   Signed by:   Willy Eddy, LPN on 44/06/270   Method used:   Electronically to        [facility] [phone]* (retail)       [address] [place], [place]  [address]       Ph: [phone] or [phone]       Fax: [phone]   RxID:   [id number] LASIX 40 MG TABS (FUROSEMIDE) 1/2 once daily  #30 x 3   Entered by:   Willy Eddy, LPN   Authorized by:   Stacie Glaze MD   Signed by:   Willy Eddy, LPN on 54/27/0623   Method used:   Electronically to        [facility] [phone]* (retail)       [address] [place], [place]  [address]       Ph: [phone] or [phone]       Fax: [phone]   RxID:   [id number] POTASSIUM CHLORIDE CRYS CR 20 MEQ CR-TABS (POTASSIUM CHLORIDE CRYS CR) Take 1/2 tablet by mouth once a day  #30 x 6   Entered by:   Willy Eddy, LPN   Authorized by:   Stacie Glaze MD   Signed by:   Willy Eddy, LPN on 17/51/0258   Method used:   Electronically to        [facility] [phone]* (retail)       [address] [place], [place]  [address]       Ph: [phone] or [phone]       Fax: [phone]   RxID:   [id number]

## 2010-07-24 NOTE — Letter (Signed)
Summary: Appointment - Cardiac MRI  [facility]     [place], [place]    Phone:   Fax:       September 26, 2009 MRN: [id number]   Zachary Decker [address] [place], [place]  [address]   Dear [name],   We have scheduled the above patient for an appointment for a Cardiac MRI on April 19,2011   at 1:00p.m .  Please refer to the below information for the location and instructions for this test:  Location:     [facility]       [address]       [place], [place]  [address] Instructions:    Wilmon Arms at [facility] Out[facility] Registration 45 minutes prior to your appointment time.  This will ensure you are in the Radiology Department 30 minutes prior to your appointment.    There are no restrictions for this test you may eat and take medications as usual.  If you need to reschedule this appointment please call at the number listed above.   Sincerely,     Lorne Skeens  [facility] Scheduling Team

## 2010-07-24 NOTE — Medication Information (Signed)
Summary: Nexium Approved  Nexium Approved   Imported By: Maryln Gottron 03/14/2010 15:37:51  _____________________________________________________________________  External Attachment:    Type:   Image     Comment:   External Document

## 2010-07-24 NOTE — Assessment & Plan Note (Signed)
Summary: ROV   Visit Type:  Follow-up Primary Provider:  Stacie Glaze MD  CC:  follow up echo done today.  History of Present Illness: Patient would like to have knee surgery.  He is limited, and cannot play golf because of the knee.Zachary Decker  His functional class is I.  No chest pain.  Feet are healing.    Current Medications (verified): 1)  Levothyroxine Sodium 50 Mcg Tabs (Levothyroxine Sodium) .... One By Mouth Daily 2)  Bayer Aspirin 325 Mg  Tabs (Aspirin) .... Once Daily 3)  Flomax 0.4 Mg Xr24h-Cap (Tamsulosin Hcl) .... Take 1 Capsule By Mouth Once A Day 4)  Lasix 40 Mg Tabs (Furosemide) .... 1/2 Once Daily 5)  Potassium Chloride Crys Cr 20 Meq Cr-Tabs (Potassium Chloride Crys Cr) .... Take 1/2 Tablet By Mouth Once A Day 6)  Lopressor 6.42ml Suspension .... Two Times A Day 7)  Simvastatin 20 Mg Tabs (Simvastatin) .... Take One Tablet By Mouth Daily At Bedtime 8)  Nexium 40 Mg Cpdr (Esomeprazole Magnesium) .... One By Mouth Daily As Needed 9)  Centrum Silver  Tabs (Multiple Vitamins-Minerals) .... Take 1 Tablet By Mouth Once A Day  Allergies: 1)  ! Pcn 2)  ! Cozaar 3)  ! 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   tracheostomy placement         Family History: Last updated: 11/17/2008  Positive CAD.      Social History: Last updated: 11/17/2008  Married, retired.  Wife can assist as needed.  Daughter   can also assist, one-level home and one step to enter.    Nonsmoker  Risk Factors: Smoking Status: never (06/25/2009)  Vital Signs:  Patient profile:   77 year old male Height:      74 inches Weight:      188.75 pounds BMI:     24.32 Pulse rate:   66 / minute Pulse rhythm:   regular Resp:     18 per minute BP sitting:   134 / 80  (left arm) Cuff size:   large  Vitals Entered By: Vikki Ports (September 13, 2009 10:57 AM)  Physical Exam  General:  Well developed, well nourished, in no acute distress. Head:  normocephalic and atraumatic Eyes:  PERRLA/EOM intact; conjunctiva and lids normal. Lungs:  Clear bilaterally to auscultation and percussion. Heart:  PMI non displaced.  Normal S1 and S2.  Positive S4.   Abdomen:  Bowel sounds positive; abdomen soft and non-tender without masses, organomegaly, or hernias noted. No hepatosplenomegaly. Pulses:  pulses normal in all 4 extremities Extremities:  No clubbing or cyanosis. Neurologic:  Alert and oriented x 3.   EKG  Procedure date:  09/13/2009  Findings:      NSR.  Possible LAE.  Inferior infarct, old.    Echocardiogram  Procedure date:  09/13/2009  Findings:       Study Conclusions            - Left ventricle: The cavity size  was normal. There was mild focal       basal hypertrophy of the septum. Systolic function was moderately       reduced. The estimated ejection fraction was 35%. There is       akinesis of the inferior and inferoseptal myocardium. There is       hypokinesis of the anteroseptal myocardium. Doppler parameters are       consistent with abnormal left ventricular relaxation (grade 1       diastolic dysfunction). Doppler parameters are consistent with       elevated ventricular end-diastolic filling pressure.     - Ventricular septum:  Septal motion showed abnormal function and       dyssynergy.     - Mitral valve: Mild regurgitation.     - Left atrium: The atrium was mildly dilated.  Cardiac Cath  Procedure date:  08/29/2008  Findings:       ANGIOGRAPHIC DATA:   1. Ventriculography done in the RAO projection reveals a fairly large       area of inferior wall hypo to near akinesis.  Ejection fraction to       be estimated at 40-45%   2. The left main is diffusely irregular with about 40% distal left       main disease.   3. The LAD courses to the apex.  There are multiple lesions in tandem       including probably 70, 60, and 70.  Both diagonal branches have 90%       ostial stenoses, the second one being somewhat smaller in caliber.   4. The circumflex provides a ramus intermedius that has about 70%       proximal disease.  The AV circumflex is totally occluded and there       appeared to be multiple collateral vessels.   5. The right coronary artery appears totally occluded in the proximal       segment.  We were able to get a guidewire well out beyond the site       of total occlusion, but it would never navigate down the vessel.       This is despite attempts with an over the wire balloon at the       lesion.      CONCLUSIONS:   1. Unsuccessful attempt at acute reperfusion therapy due to inability       to cross the site of total occlusion despite multiple wires.   2. Acute inferior wall myocardial infarction.   3. Severe multivessel coronary artery disease with moderate left       ventricular dysfunction.   Impression & Recommendations:  Problem # 1:  CORONARY ARTERY BYPASS GRAFT, HX OF (ICD-V45.81) Is doing extraordinarily well, with good functional status.  EF is a bit lower than I would have guessed based on how he is doing.  I would favor an additional functional study, such as MRI to assess function, and true EF and extent of scar.  Then we can decide on EP approach based on accurate numbers instead  of estimate of EF.  I think he likely could have surgery at this point, but will need to watch.  Orders: EKG w/ Interpretation (93000)  Problem # 2:  HYPERTENSION (ICD-401.9) BP is well controlled.   Problem # 3:  HYPERCHOLESTEROLEMIA (ICD-272.0) On lipid lowering therapy.  LDL 62 in December, with normal LFTs.  Dr. Lovell Sheehan is following the patient. His updated medication list for  this problem includes:    Simvastatin 20 Mg Tabs (Simvastatin) .Zachary Decker... Take one tablet by mouth daily at bedtime  Patient Instructions: 1)  Your physician recommends that you continue on your current medications as directed. Please refer to the Current Medication list given to you today. 2)  Your physician wants you to follow-up in 6 MONTHS:   You will receive a reminder letter in the mail two months in advance. If you don't receive a letter, please call our office to schedule the follow-up appointment.

## 2010-07-24 NOTE — Assessment & Plan Note (Signed)
Summary: Zachary Decker   Visit Type:  6 months follow up Primary Doshia Dalia:  Stacie Glaze MD  CC:  No cardiac complains.  History of Present Illness: He is strugglling to get his golf game back, and trying to get that back while working on it.  Turning has been impacted.  Denies chest pain, shortness of breath.  He has not missed a weak working out since he went to rehab.    Current Medications (verified): 1)  Levothyroxine Sodium 50 Mcg Tabs (Levothyroxine Sodium) .... One By Mouth Daily 2)  Bayer Aspirin 325 Mg  Tabs (Aspirin) .... Once Daily 3)  Flomax 0.4 Mg Xr24h-Cap (Tamsulosin Hcl) .... Take 1 Capsule By Mouth Once A Day 4)  Lasix 40 Mg Tabs (Furosemide) .... 1/2 Once Daily 5)  Potassium Chloride Crys Cr 20 Meq Cr-Tabs (Potassium Chloride Crys Cr) .... Take 1/2 Tablet By Mouth Once A Day 6)  Simvastatin 20 Mg Tabs (Simvastatin) .... Take One Tablet By Mouth Daily At Bedtime 7)  Nexium 40 Mg Cpdr (Esomeprazole Magnesium) .... One By Mouth Daily As Needed 8)  Centrum Silver  Tabs (Multiple Vitamins-Minerals) .... Take 1 Tablet By Mouth Once A Day 9)  Carvedilol 3.125 Mg Tabs (Carvedilol) .... One By Mouth Two Times A Day  Allergies: 1)  ! Pcn 2)  ! Cozaar 3)  ! 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   tracheostomy placement       Family History: Last updated: 11/17/2008  Positive CAD.      Social History: Last updated: 11/17/2008  Married, retired.  Wife can assist as needed.  Daughter   can also assist, one-level home and one step to enter.    Nonsmoker  Vital Signs:  Patient profile:   77 year old male Height:      74 inches Weight:      190.75 pounds BMI:     24.58 Pulse rate:   55 / minute Pulse rhythm:   irregular Resp:     18 per minute BP sitting:   144 / 78  (left arm) Cuff size:   large  Vitals Entered By: Vikki Ports (March 22, 2010 8:56 AM)  Physical Exam  General:  Well developed, well nourished, in no acute distress. Head:  normocephalic and atraumatic Eyes:  PERRLA/EOM intact; conjunctiva and lids normal. Lungs:  Clear bilaterally to auscultation and percussion. Heart:  PMI non displaced.  Normal S1 and S2.  Pos S4.   Abdomen:  Bowel sounds positive; abdomen soft and non-tender without masses, organomegaly, or hernias noted. No hepatosplenomegaly. Pulses:  pulses normal in all 4 extremities Extremities:  No clubbing or cyanosis. Neurologic:  Alert and oriented x 3.   EKG  Procedure date:  03/22/2010  Findings:      NSR.  Low voltage in limb leads.  Inferior MI, old   MRI EXAM  Procedure date:  10/09/2009  Findings:      Impression:   1)  Moderate LVE with EF 38%   2)    Thinned and akinetic inferior wall.  However only subendocardial uptake of gadolineum is seen.  Small area of full thickness scar in the anteroseptum 3)    Mild LAE 4)    Mild RV enlargement. Copy to Dr Bonnee Quin   Read By:  Wendall Stade,  M.D.      Impression & Recommendations:  Problem # 1:  CORONARY ARTERY BYPASS GRAFT, HX OF (ICD-V45.81) stable.  No symptoms at present.  Really doing well.  Problem # 2:  HYPERTENSION (ICD-401.9) Controlled.  Will add ARB in light of overall reduced EF  below 40%. The following medications were removed from the medication list:    Lasix 40 Mg Tabs (Furosemide) .Marland Kitchen... 1/2 once daily His updated medication list for this problem includes:    Bayer Aspirin 325 Mg Tabs (Aspirin) ..... Once daily    Carvedilol 3.125 Mg Tabs (Carvedilol) ..... One by mouth two times a day    Atacand 8 Mg Tabs (Candesartan cilexetil) .Marland Kitchen... Take one by mouth daily  Problem # 3:  CARDIOMYOPATHY, ISCHEMIC (ICD-414.8) Weight stable.  No shortness of breath.  Will stop K, furosemide.  Add low dose ARB.  Daily weights, BP, check BMET sometime next week.  Cautions of medication reviewed.  Does not appear at this point to require diuretics, and he remains with EF in the range of 40% with class I symptoms.  ACE or ARB would be ideal.  Would lean toward ARB at this point to avoid cough issues.   The following medications were removed from the medication list:    Lasix 40 Mg Tabs (Furosemide) .Marland Kitchen... 1/2 once daily His updated medication list for this problem includes:    Bayer Aspirin 325 Mg Tabs (Aspirin) ..... Once daily    Carvedilol 3.125 Mg Tabs (Carvedilol) ..... One by mouth two times a day    Atacand 8 Mg Tabs (Candesartan cilexetil) .Marland Kitchen... Take one by mouth daily  Problem # 4:  HYPERCHOLESTEROLEMIA (ICD-272.0) Under care of Dr. Lovell Sheehan.  Will need to monitor results carefully, so BMET next week with new medication.  His updated medication list for this problem includes:    Simvastatin 20 Mg Tabs (Simvastatin) .Marland Kitchen... Take one tablet by mouth daily at bedtime  Other Orders: EKG w/ Interpretation (93000)  Patient Instructions: 1)  Your physician recommends that you schedule a follow-up appointment in: 4 weeks 2)  Your physician recommends that you return for lab work next Wednesday. BMP 414.04 3)  Your physician has recommended you make the following change in your medication: Stop Furosemide and Potassium.  Start Atacand 8 mg by mouth daily. 4)  Weigh yourself every  morning and check blood pressure daily. Keep record of this and bring to appt. with Dr. Riley Kill in 4 weeks. Prescriptions: ATACAND 8 MG TABS (CANDESARTAN CILEXETIL) take one by mouth daily  #35 x 0   Entered by:   Dossie Arbour, RN, BSN   Authorized by:   Ronaldo Miyamoto, MD, [facility]   Signed by:   Ronaldo Miyamoto, MD, [facility] on 03/22/2010   Method used:   Samples Given   RxID:   [id number]

## 2010-07-24 NOTE — Assessment & Plan Note (Signed)
Summary: 1 month rov   Visit Type:  1 month ROV Primary Provider:  Stacie Glaze MD  CC:  no complaints.  History of Present Illness: Patient complaining of some fatigue since he started on candesartan.  Denies any chest pain.  BP log shows slightly lower BP since taking, but in acceptable range.  However, it might be low for him.  He has not been walking as much.    Problems Prior to Update: 1)  Cardiomyopathy, Ischemic  (ICD-414.8) 2)  Actinic Keratosis  (ICD-702.0) 3)  Loc Osteoarthros Not Spec Prim/sec Lower Leg  (ICD-715.36) 4)  Basal Cell Carcinoma, Nose  (ICD-173.3) 5)  Hoarseness  (ICD-784.49) 6)  Osteoarthritis, Lower Leg, Left  (ICD-715.36) 7)  Coronary Artery Bypass Graft, Hx of  (ICD-V45.81) 8)  Cad, Artery Bypass Graft  (ICD-414.04) 9)  Myocardial Infarction, Inferoposterior Wall, 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) 10)  Hypertension  (ICD-401.9) 11)  Hypercholesterolemia  (ICD-272.0) 12)  Pvd  (ICD-443.9) 13)  Deep Venous Thrombophlebitis, Hx of  (ICD-V12.52) 14)  Encounter For Long-term Use of Other Medications  (ICD-V58.69) 15)  Respiratory Failure  (ICD-518.81) 16)  Gerd  (ICD-530.81) 17)  Benign Prostatic Hypertrophy, With Urinary Obstruction  (ICD-600.01) 18)  Advef, Drug/medicinal/biological Subst Nos  (ICD-995.20) 19)  Hypothyroidism  (ICD-244.9) 20)  Family History of Colon Ca 1st Degree Relative <60  (ICD-V16.0)  Current Medications (verified): 1)  Levothyroxine Sodium 50 Mcg Tabs (Levothyroxine Sodium) .... One By Mouth Daily 2)  Bayer Aspirin 325 Mg  Tabs (Aspirin) .... Once Daily 3)  Flomax 0.4 Mg Xr24h-Cap (Tamsulosin Hcl) .... Take 1 Capsule By Mouth Once A Day 4)  Simvastatin 20 Mg Tabs (Simvastatin) .... Take One Tablet By Mouth Daily At Bedtime 5)  Nexium 40 Mg Cpdr (Esomeprazole Magnesium) .... One By Mouth Daily As Needed 6)  Centrum Silver  Tabs (Multiple Vitamins-Minerals) .... Take 1 Tablet By Mouth Once A Day 7)  Carvedilol 3.125 Mg Tabs  (Carvedilol) .... One By Mouth Two Times A Day 8)  Atacand 8 Mg Tabs (Candesartan Cilexetil) .... Take One By Mouth Daily  Allergies (verified): 1)  ! Pcn 2)  ! Cozaar 3)  ! 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   tracheostomy placement       Family History: Last updated: 11/17/2008  Positive CAD.      Social History: Last updated: 11/17/2008  Married, retired.  Wife can assist as needed.  Daughter   can also assist, one-level home and one step to enter.    Nonsmoker  Risk Factors: Smoking Status: never (01/16/2010)  Vital Signs:  Patient profile:   77 year old male Height:      74 inches Weight:      193.50 pounds BMI:     24.93 Pulse (ortho):   60 / minute BP sitting:   149 / 80  (left arm) Cuff size:   regular  Vitals Entered By: Caralee Ates CMA (April 17, 2010 8:56 AM)  Physical  Exam  General:  Well developed, well nourished, in no acute distress. Head:  normocephalic and atraumatic Eyes:  PERRLA/EOM intact; conjunctiva and lids normal. Lungs:  clear to auscultation and percussion.  No rales Heart:  PMI non displaced.  Normal S1 and S2.  No definite murmur.  Extremities:  No clubbing or cyanosis. Neurologic:  Alert  and oriented x 3.   Impression & Recommendations:  Problem # 1:  CARDIOMYOPATHY, ISCHEMIC (ICD-414.8) He is tolerating reasonably.  No chest pain.  BP may be a bit lower.  Will continue, recheck BMET, and get TSH.  Followup Dr. Lovell Sheehan. We will see again in six weeks.  Encouraged to walk during that period.  His updated medication list for this problem includes:    Aspirin 81 Mg Tbec (Aspirin) .Marland Kitchen... Take one tablet by mouth daily    Carvedilol 3.125 Mg Tabs (Carvedilol) ..... One by mouth two times a day    Atacand 8 Mg Tabs (Candesartan cilexetil) .Marland Kitchen... Take one by mouth daily  Orders: TLB-BMP (Basic Metabolic Panel-BMET) (80048-METABOL) TLB-CBC Platelet - w/Differential (85025-CBCD)  Problem # 2:  HYPERTENSION (ICD-401.9) BP lower on meds.  His updated medication list for this problem includes:    Aspirin 81 Mg Tbec (Aspirin) .Marland Kitchen... Take one tablet by mouth daily    Carvedilol 3.125 Mg Tabs (Carvedilol) ..... One by mouth two times a day    Atacand 8 Mg Tabs (Candesartan cilexetil) .Marland Kitchen... Take one by mouth daily  Orders: TLB-BMP (Basic Metabolic Panel-BMET) (80048-METABOL) TLB-CBC Platelet - w/Differential (85025-CBCD)  Problem # 3:  HYPOTHYROIDISM (ICD-244.9) Recheck TSH.  Last check greater than one year.  Rule out source of fatigue.  His updated medication list for this problem includes:    Levothyroxine Sodium 50 Mcg Tabs (Levothyroxine sodium) ..... One by mouth daily  Orders: TLB-BMP (Basic Metabolic Panel-BMET) (80048-METABOL) TLB-CBC Platelet - w/Differential (85025-CBCD)  Patient Instructions: 1)  Your physician recommends that you schedule a follow-up appointment in: 6 WEEKS 2)  Your physician recommends that you have lab work today: BMP and TSH 3)  Your physician has recommended you make the following change in your medication: DECREASE Aspirin to 81mg  once a day

## 2010-07-25 NOTE — Assessment & Plan Note (Signed)
Summary: f6w   Visit Type:  Follow-up Primary Geetika Laborde:  Stacie Glaze MD  CC:  no conmplaints.  History of Present Illness: Doing really well.  No complaints.  Denies chest pain.  Getting along well.  Needs to exchange candesartan because not on drug plan.  We reveiwed Lisinopril.  Playing golf two times per week, and also working out three times per week.  Current Problems (verified): 1)  Anemia, Vitamin B12 Deficiency  (ICD-281.1) 2)  Cardiomyopathy, Ischemic  (ICD-414.8) 3)  Actinic Keratosis  (ICD-702.0) 4)  Loc Osteoarthros Not Spec Prim/sec Lower Leg  (ICD-715.36) 5)  Basal Cell Carcinoma, Nose  (ICD-173.3) 6)  Hoarseness  (ICD-784.49) 7)  Osteoarthritis, Lower Leg, Left  (ICD-715.36) 8)  Coronary Artery Bypass Graft, Hx of  (ICD-V45.81) 9)  Cad, Artery Bypass Graft  (ICD-414.04) 10)  Myocardial Infarction, Inferoposterior Wall, 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) 11)  Hypertension  (ICD-401.9) 12)  Hypercholesterolemia  (ICD-272.0) 13)  Pvd  (ICD-443.9) 14)  Deep Venous Thrombophlebitis, Hx of  (ICD-V12.52) 15)  Encounter For Long-term Use of Other Medications  (ICD-V58.69) 16)  Respiratory Failure  (ICD-518.81) 17)  Gerd  (ICD-530.81) 18)  Benign Prostatic Hypertrophy, With Urinary Obstruction  (ICD-600.01) 19)  Advef, Drug/medicinal/biological Subst Nos  (ICD-995.20) 20)  Hypothyroidism  (ICD-244.9) 21)  Family History of Colon Ca 1st Degree Relative <60  (ICD-V16.0)  Current Medications (verified): 1)  Levothyroxine Sodium 50 Mcg Tabs (Levothyroxine Sodium) .... One By Mouth Daily 2)  Aspirin 81 Mg Tbec (Aspirin) .... Take One Tablet By Mouth Daily 3)  Flomax 0.4 Mg Xr24h-Cap (Tamsulosin Hcl) .... Take 1 Capsule By Mouth Once A Day 4)  Simvastatin 20 Mg Tabs (Simvastatin) .... Take One Tablet By Mouth Daily At Bedtime 5)  Nexium 40 Mg Cpdr (Esomeprazole Magnesium) .... One By Mouth Daily As Needed 6)  Centrum Silver  Tabs (Multiple Vitamins-Minerals) .... Take 1 Tablet  By Mouth Once A Day 7)  Carvedilol 3.125 Mg Tabs (Carvedilol) .... One By Mouth Two Times A Day 8)  Atacand 8 Mg Tabs (Candesartan Cilexetil) .... Take One By Mouth Daily 9)  Folbee Plus  Tabs (B Complex-C-Folic Acid) .... One By Mouth Daily  Allergies (verified): 1)  ! Pcn 2)  ! Cozaar 3)  ! 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) HYPERTENSION (ICD-401.9) HYPERCHOLESTEROLEMIA (ICD-272.0) PVD (ICD-443.9) DEEP VENOUS THROMBOPHLEBITIS, HX OF (ICD-V12.52) ENCOUNTER FOR LONG-TERM USE OF OTHER MEDICATIONS (ICD-V58.69) RESPIRATORY FAILURE (ICD-518.81) GERD (ICD-530.81) BENIGN PROSTATIC HYPERTROPHY, WITH URINARY OBSTRUCTION (ICD-600.01) ADVEF, DRUG/MEDICINAL/BIOLOGICAL SUBST NOS (ICD-995.20) HYPOTHYROIDISM (ICD-244.9) FAMILY HISTORY OF COLON CA 1ST DEGREE RELATIVE <60 (ICD-V16.0)  Vital Signs:  Patient profile:   77 year old male Height:      74 inches Weight:      194 pounds BMI:     25.00 Pulse rate:   60 / minute Resp:     16 per minute BP sitting:   153 / 83  (right arm)  Vitals Entered By: Marrion Coy, CNA (June 04, 2010 11:35 AM)  Physical Exam  General:  Well developed, well nourished, in no acute distress. Head:  normocephalic and atraumatic Eyes:  PERRLA/EOM intact; conjunctiva and lids normal. Lungs:  Clear bilaterally to auscultation and percussion. Heart:  PMI non displaced. Normal S1 and S2.  No murmur Extremities:  Good pulses.  Digits improved.  Neurologic:  Alert and oriented x 3.   EKG  Procedure date:  06/04/2010  Findings:      NSR.  First degree av block.  Old inferior Mi.  Impression & Recommendations:  Problem # 1:  CARDIOMYOPATHY, ISCHEMIC (ICD-414.8) Will change from Atacand to Lisinopril.  Risks of drug reviewed.  Will check in one week.  His updated medication list for  this problem includes:    Aspirin 81 Mg Tbec (Aspirin) .Marland Kitchen... Take one tablet by mouth daily    Carvedilol 3.125 Mg Tabs (Carvedilol) ..... One by mouth two times a day    Lisinopril 10 Mg Tabs (Lisinopril) .Marland Kitchen... Take one tablet by mouth daily  Problem # 2:  CAD, ARTERY BYPASS GRAFT (ICD-414.04) stable His updated medication list for this problem includes:    Aspirin 81 Mg Tbec (Aspirin) .Marland Kitchen... Take one tablet by mouth daily    Carvedilol 3.125 Mg Tabs (Carvedilol) ..... One by mouth two times a day    Lisinopril 10 Mg Tabs (Lisinopril) .Marland Kitchen... Take one tablet by mouth daily  Problem # 3:  HYPERTENSION (ICD-401.9) better at home. His updated medication list for this problem includes:    Aspirin 81 Mg Tbec (Aspirin) .Marland Kitchen... Take one tablet by mouth daily    Carvedilol 3.125 Mg Tabs (Carvedilol) ..... One by mouth two times a day    Lisinopril 10 Mg Tabs (Lisinopril) .Marland Kitchen... Take one tablet by mouth daily  Problem # 4:  HYPERCHOLESTEROLEMIA (ICD-272.0) managed by Dr. Lovell Sheehan His updated medication list for this problem includes:    Simvastatin 20 Mg Tabs (Simvastatin) .Marland Kitchen... Take one tablet by mouth daily at bedtime  Other Orders: EKG w/ Interpretation (93000)  Patient Instructions: 1)  Your physician recommends that you return for lab work in: 1 WEEK (BMP 414.04, 272.0, 414.8) 2)  Your physician has recommended you make the following change in your medication: STOP Atacand, START Lisinopril 10mg  take one tablet daily 3)  Your physician recommends that you schedule a follow-up appointment in: 3 MONTHS Prescriptions: LISINOPRIL 10 MG TABS (LISINOPRIL) Take one tablet by mouth daily  #30 x 8   Entered by:   Julieta Gutting, RN, BSN   Authorized by:   Ronaldo Miyamoto, MD, [facility]   Signed by:   Julieta Gutting, RN, BSN on 06/04/2010   Method used:   Electronically to        [facility] [phone]* (retail)       [address] [place], [place]  [address]       Ph: [phone] or  [phone]       Fax: [phone]   RxID:   [id number]

## 2010-07-31 NOTE — Assessment & Plan Note (Signed)
Summary: 3 month rov/njr   Vital Signs:  Patient profile:   77 year old male Height:      74 inches Weight:      194 pounds BMI:     25.00 Temp:     98.2 degrees F oral Pulse rate:   60 / minute Resp:     14 per minute BP sitting:   118 / 72  (left arm)  Vitals Entered By: Willy Eddy, LPN (July 22, 2010 9:37 AM) CC: roa- having difficulty getting nexium pa- wants to try tagamet, Lipid Management, Hypertension Management Is Patient Diabetic? No   Primary Care Provider:  Stacie Glaze MD  CC:  roa- having difficulty getting nexium pa- wants to try tagamet, Lipid Management, and Hypertension Management.  History of Present Illness:  the patient is followed for hyperlipidemia GERD and hypertension. Havingt recently been seen by cardiology a bmet  that was drawn which was excellent.  is having trouble getting Nexium filled due to cost we are going to talk about alternatives to Nexium I am concerned about the use of simvastatin this history of cardiomyopathy and the potential drug interaction this medication consideration for changing to 5 mg of Crestor and using samples to supplement we could achieve a cost effective regime with the Crestor by taking 10 mg Monday Wednesday Friday , ifthat was necessary.   Hypertension History:      He denies headache, chest pain, palpitations, dyspnea with exertion, orthopnea, PND, peripheral edema, visual symptoms, neurologic problems, syncope, and side effects from treatment.        Positive major cardiovascular risk factors include male age 49 years old or older, hyperlipidemia, and hypertension.  Negative major cardiovascular risk factors include non-tobacco-user status.        Positive history for target organ damage include ASHD (either angina/prior MI/prior CABG) and peripheral vascular disease.    Lipid Management History:      Positive NCEP/ATP III risk factors include male age 56 years old or older, HDL cholesterol less than 40,  hypertension, ASHD (either angina/prior MI/prior CABG), and peripheral vascular disease.  Negative NCEP/ATP III risk factors include non-tobacco-user status.      Preventive Screening-Counseling & Management  Alcohol-Tobacco     Smoking Status: never     Tobacco Counseling: not indicated; no tobacco use  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) 11)  Hypertension  (ICD-401.9) 12)  Hypercholesterolemia  (ICD-272.0) 13)  Pvd  (ICD-443.9) 14)  Deep Venous Thrombophlebitis, Hx of  (ICD-V12.52) 15)  Encounter For Long-term Use of Other Medications  (ICD-V58.69) 16)  Respiratory Failure  (ICD-518.81) 17)  Gerd  (ICD-530.81) 18)  Benign Prostatic Hypertrophy, With Urinary Obstruction  (ICD-600.01) 19)  Advef, Drug/medicinal/biological Subst Nos  (ICD-995.20) 20)  Hypothyroidism  (ICD-244.9) 21)  Family History of Colon Ca 1st Degree Relative <60  (ICD-V16.0)  Current Problems (verified): 1)  Anemia, Vitamin B12 Deficiency  (ICD-281.1) 2)  Cardiomyopathy, Ischemic  (ICD-414.8) 3)  Actinic Keratosis  (ICD-702.0) 4)  Loc Osteoarthros Not Spec Prim/sec Lower Leg  (  ICD-715.36) 5)  Basal Cell Carcinoma, Nose  (ICD-173.3) 6)  Hoarseness  (ICD-784.49) 7)  Osteoarthritis, Lower Leg, Left  (ICD-715.36) 8)  Coronary Artery Bypass Graft, Hx of  (ICD-V45.81) 9)  Cad, Artery Bypass Graft  (ICD-414.04) 10)  Myocardial Infarction, Inferoposterior Wall, 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) 11)  Hypertension  (ICD-401.9) 12)   Hypercholesterolemia  (ICD-272.0) 13)  Pvd  (ICD-443.9) 14)  Deep Venous Thrombophlebitis, Hx of  (ICD-V12.52) 15)  Encounter For Long-term Use of Other Medications  (ICD-V58.69) 16)  Respiratory Failure  (ICD-518.81) 17)  Gerd  (ICD-530.81) 18)  Benign Prostatic Hypertrophy, With Urinary Obstruction  (ICD-600.01) 19)  Advef, Drug/medicinal/biological Subst Nos  (ICD-995.20) 20)  Hypothyroidism  (ICD-244.9) 21)  Family History of Colon Ca 1st Degree Relative <60  (ICD-V16.0)  Medications Prior to Update: 1)  Levothyroxine Sodium 50 Mcg Tabs (Levothyroxine Sodium) .... One By Mouth Daily 2)  Aspirin 81 Mg Tbec (Aspirin) .... Take One Tablet By Mouth Daily 3)  Flomax 0.4 Mg Xr24h-Cap (Tamsulosin Hcl) .... Take 1 Capsule By Mouth Once A Day 4)  Simvastatin 20 Mg Tabs (Simvastatin) .... Take One Tablet By Mouth Daily At Bedtime 5)  Nexium 40 Mg Cpdr (Esomeprazole Magnesium) .... One By Mouth Daily As Needed 6)  Centrum Silver  Tabs (Multiple Vitamins-Minerals) .... Take 1 Tablet By Mouth Once A Day 7)  Carvedilol 3.125 Mg Tabs (Carvedilol) .... One By Mouth Two Times A Day 8)  Lisinopril 10 Mg Tabs (Lisinopril) .... Take One Tablet By Mouth Daily 9)  Folbee Plus  Tabs (B Complex-C-Folic Acid) .... One By Mouth Daily  Current Medications (verified): 1)  Levothyroxine Sodium 50 Mcg Tabs (Levothyroxine Sodium) .... One By Mouth Daily 2)  Aspirin 81 Mg Tbec (Aspirin) .... Take One Tablet By Mouth Daily 3)  Flomax 0.4 Mg Xr24h-Cap (Tamsulosin Hcl) .... Take 1 Capsule By Mouth Once A Day 4)  Simvastatin 20 Mg Tabs (Simvastatin) .... Take One Tablet By Mouth Daily At Bedtime 5)  Nexium 40 Mg Cpdr (Esomeprazole Magnesium) .... One By Mouth Daily As Needed 6)  Centrum Silver  Tabs (Multiple Vitamins-Minerals) .... Take 1 Tablet By Mouth Once A Day 7)  Carvedilol 3.125 Mg Tabs (Carvedilol) .... One By Mouth Two Times A Day 8)  Lisinopril 10 Mg Tabs (Lisinopril) .... Take One Tablet By Mouth Daily 9)   Folbee Plus  Tabs (B Complex-C-Folic Acid) .... One By Mouth Daily  Allergies (verified): 1)  ! Pcn 2)  ! Cozaar 3)  ! Naprosyn  Past History:  Family History: Last updated: 11/17/2008  Positive CAD.      Social History: Last updated: 11/17/2008  Married, retired.  Wife can assist as needed.  Daughter   can also assist, one-level home and one step to enter.    Nonsmoker  Risk Factors: Smoking Status: never (07/22/2010)  Past medical, surgical, family and social histories (including risk factors) reviewed, and no changes noted (except as noted below).  Past Medical History: Reviewed history from 11/17/2008 and no changes required. Current Problems:  CAD, ARTERY BYPASS GRAFT (ICD-414.04) MYOCARDIAL INFARCTION, INFEROPOSTERIOR WALL, SUBSEQUENT CARE (ICD-410.32) HYPERTENSION (ICD-401.9) HYPERCHOLESTEROLEMIA (ICD-272.0) PVD (ICD-443.9) DEEP VENOUS THROMBOPHLEBITIS, HX OF (ICD-V12.52) ENCOUNTER FOR LONG-TERM USE OF OTHER MEDICATIONS (ICD-V58.69) RESPIRATORY FAILURE (ICD-518.81) GERD (ICD-530.81) BENIGN PROSTATIC HYPERTROPHY, WITH URINARY OBSTRUCTION (ICD-600.01) ADVEF, DRUG/MEDICINAL/BIOLOGICAL SUBST NOS (ICD-995.20) HYPOTHYROIDISM (ICD-244.9) FAMILY HISTORY OF COLON CA 1ST DEGREE RELATIVE <60 (ICD-V16.0)  Past Surgical History: Reviewed history from 11/17/2008 and no changes required. Tonsillectomy Knee Surgery coronary  artery  bypass graft x5 on August 29, 2008.   tracheostomy placement       Family History: Reviewed history from 11/17/2008 and no changes required.  Positive CAD.      Social History: Reviewed history from 11/17/2008 and no changes required.  Married, retired.  Wife can assist as needed.  Daughter   can also assist, one-level home and one step to enter.    Nonsmoker  Review of Systems  The patient denies anorexia, fever, weight loss, weight gain, vision loss, decreased hearing, hoarseness, chest pain, syncope, dyspnea on exertion, peripheral  edema, prolonged cough, headaches, hemoptysis, abdominal pain, melena, hematochezia, severe indigestion/heartburn, hematuria, incontinence, genital sores, muscle weakness, suspicious skin lesions, transient blindness, difficulty walking, depression, unusual weight change, abnormal bleeding, enlarged lymph nodes, angioedema, breast masses, and testicular masses.    Physical Exam  General:  alert, well-developed, and pale.   Head:  normocephalic and atraumatic.   Eyes:  pupils equal and pupils round.   Ears:  R ear normal and L ear normal.   Nose:  no external deformity and no nasal discharge.   Mouth:  good dentition and pharynx pink and moist.   Neck:  No deformities, masses, or tenderness noted. Lungs:  normal respiratory effort and no wheezes.   Heart:  regular rhythm, no gallop, and no rub.  heart sounds are reduced  pulse  was 70  Abdomen:  soft and no masses.   Msk:  normal ROM, no joint tenderness, and joint swelling.   Pulses:  R and L carotid,radial,femoral,dorsalis pedis and posterior tibial pulses are full and equal bilaterally Extremities:  trace left pedal edema and trace right pedal edema.   Neurologic:  alert & oriented X3 and abnormal gait.     Impression & Recommendations:  Problem # 1:  ANEMIA, VITAMIN B12 DEFICIENCY (ICD-281.1) Assessment Improved  on oral B12 supplementation Hgb: 13.4 (04/17/2010)   Hct: 38.3 (04/17/2010)   Platelets: 245.0 (04/17/2010) RBC: 3.90 (04/17/2010)   RDW: 12.9 (04/17/2010)   WBC: 8.9 (04/17/2010) MCV: 98.2 (04/17/2010)   MCHC: 34.9 (04/17/2010) TSH: 2.20 (01/16/2010)  Problem # 2:  CARDIOMYOPATHY, ISCHEMIC (ICD-414.8) Assessment: Unchanged  review Dr. Carles Collet note His updated medication list for this problem includes:    Aspirin 81 Mg Tbec (Aspirin) .Marland Kitchen... Take one tablet by mouth daily    Carvedilol 3.125 Mg Tabs (Carvedilol) ..... One by mouth two times a day    Lisinopril 10 Mg Tabs (Lisinopril) .Marland Kitchen... Take one tablet by mouth  daily  Problem # 3:  HYPERTENSION (ICD-401.9) Assessment: Improved  good blood pressure control and current medications His updated medication list for this problem includes:    Carvedilol 3.125 Mg Tabs (Carvedilol) ..... One by mouth two times a day    Lisinopril 10 Mg Tabs (Lisinopril) .Marland Kitchen... Take one tablet by mouth daily  BP today: 118/72 Prior BP: 153/83 (06/04/2010)  Prior 10 Yr Risk Heart Disease: N/A (03/13/2009)  Labs Reviewed: K+: 4.1 (06/13/2010) Creat: : 1.2 (06/13/2010)   Chol: 142 (01/16/2010)   HDL: 39.50 (01/16/2010)   LDL: 62 (06/11/2009)   TG: 115.0 (06/11/2009)  Problem # 4:  HYPERCHOLESTEROLEMIA (ICD-272.0) Assessment: Improved   changing from simvastatin to Crestor 5 mg a day if this is effective will use 10 mg Monday Wednesday Friday for cost effectiveness. His updated medication list for this problem includes:    Crestor 5 Mg Tabs (Rosuvastatin calcium) ..... One by mouth daily  Labs Reviewed: SGOT: 34 (01/16/2010)   SGPT: 26 (01/16/2010)  Lipid  Goals: Chol Goal: 200 (07/22/2010)   HDL Goal: 40 (07/22/2010)   LDL Goal: 100 (07/22/2010)   TG Goal: 150 (07/22/2010)  Prior 10 Yr Risk Heart Disease: N/A (03/13/2009)   HDL:39.50 (01/16/2010), 36.40 (06/11/2009)  LDL:62 (06/11/2009), 62 (01/03/2009)  Chol:142 (01/16/2010), 121 (06/11/2009)  Trig:115.0 (06/11/2009), 128.0 (01/03/2009)  Complete Medication List: 1)  Levothyroxine Sodium 50 Mcg Tabs (Levothyroxine sodium) .... One by mouth daily 2)  Aspirin 81 Mg Tbec (Aspirin) .... Take one tablet by mouth daily 3)  Flomax 0.4 Mg Xr24h-cap (Tamsulosin hcl) .... Take 1 capsule by mouth once a day 4)  Crestor 5 Mg Tabs (Rosuvastatin calcium) .... One by mouth daily 5)  Nexium 40 Mg Cpdr (Esomeprazole magnesium) .... One by mouth daily as needed 6)  Centrum Silver Tabs (Multiple vitamins-minerals) .... Take 1 tablet by mouth once a day 7)  Carvedilol 3.125 Mg Tabs (Carvedilol) .... One by mouth two times a day 8)   Lisinopril 10 Mg Tabs (Lisinopril) .... Take one tablet by mouth daily 9)  Folbee Plus Tabs (B complex-c-folic acid) .... One by mouth daily  Hypertension Assessment/Plan:      The patient's hypertensive risk group is category C: Target organ damage and/or diabetes.  Today's blood pressure is 118/72.  His blood pressure goal is < 140/90.  Lipid Assessment/Plan:      Based on NCEP/ATP III, the patient's risk factor category is "history of coronary disease, peripheral vascular disease, cerebrovascular disease, or aortic aneurysm".  The patient's lipid goals are as follows: Total cholesterol goal is 200; LDL cholesterol goal is 100; HDL cholesterol goal is 40; Triglyceride goal is 150.    Patient Instructions: 1)  Please schedule a follow-up appointment in 2 months. 2)  Hepatic Panel prior to visit, ICD-9:995.20 3)  Lipid Panel prior to visit, ICD-9:272.4 4)  TSH prior to visit, ICD-9:244.9   Orders Added: 1)  Est. Patient Level IV [16109]

## 2010-08-06 IMAGING — CR DG CHEST 1V PORT
1 series · 1 of 1 positions shown · non-contrast
Comparison: 08/31/2008 study

CLINICAL DATA: History given of coronary bypass grafting.  History
of ventilator therapy.

PORTABLE CHEST - 1 VIEW

[AP]
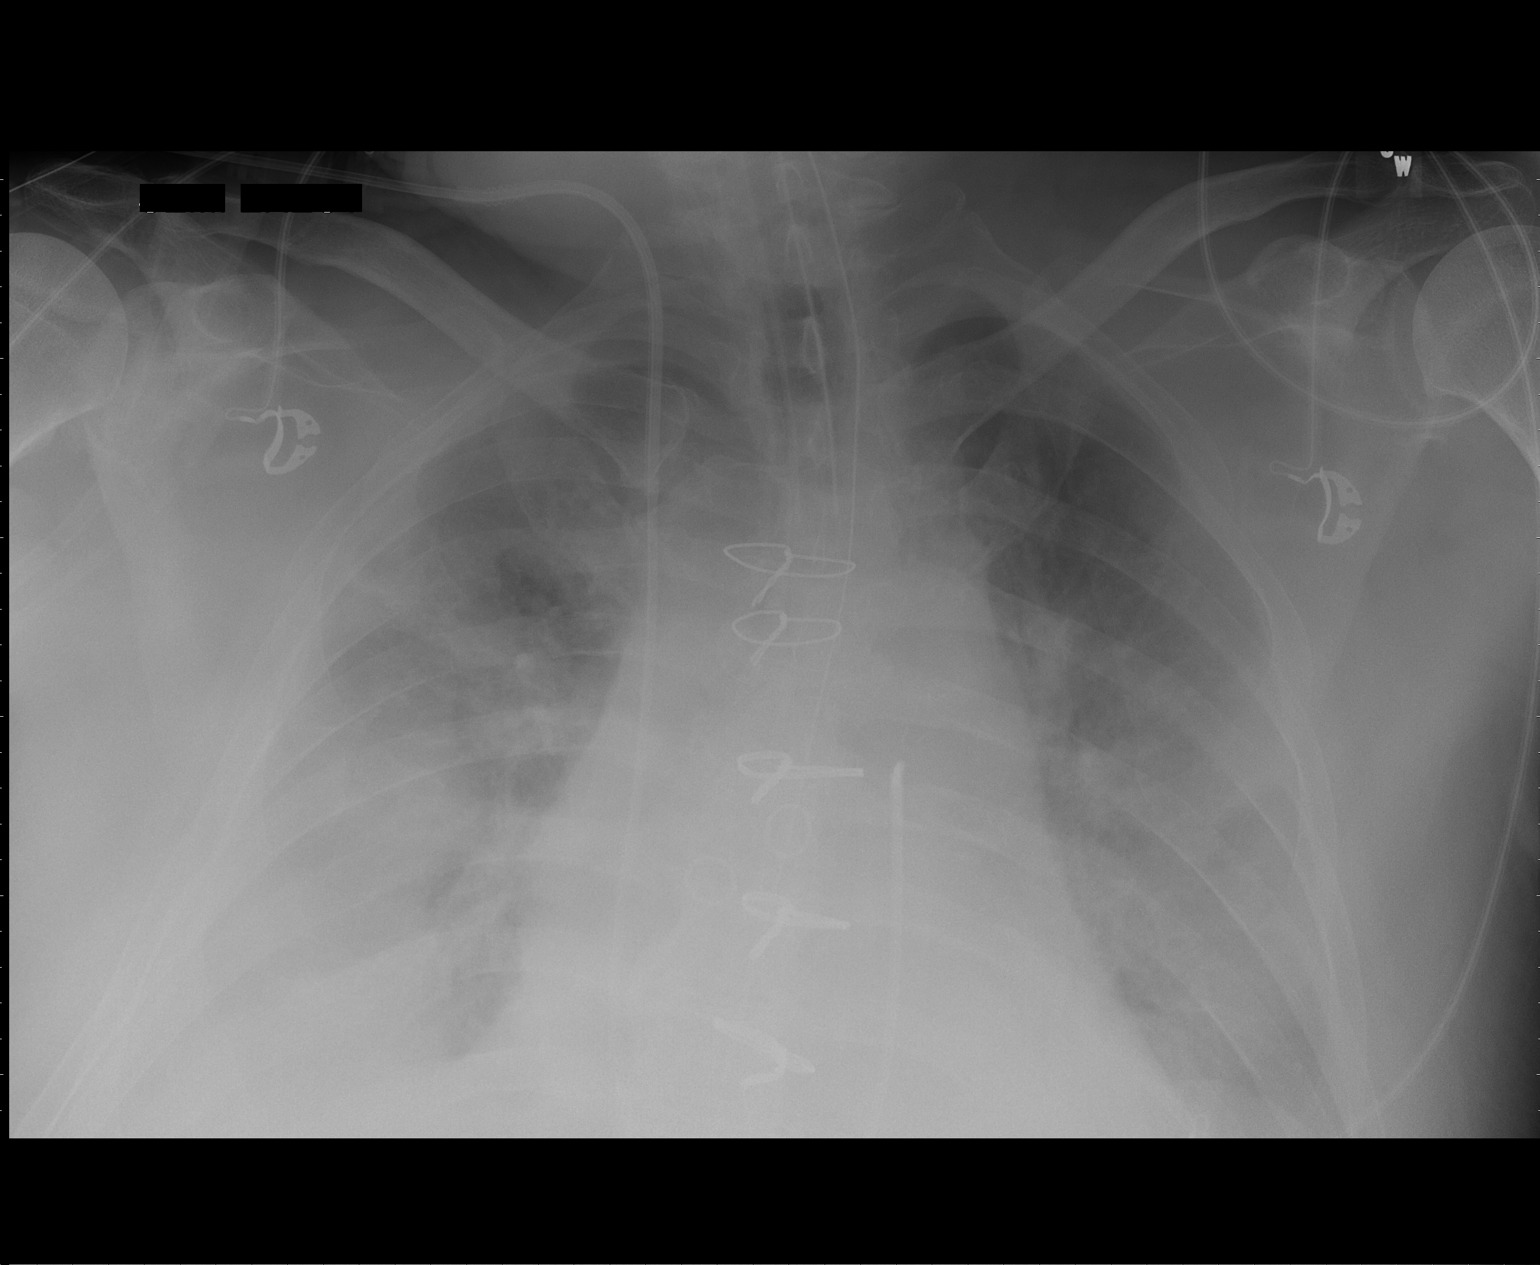

[1 of 1 positions shown; findings below may reference images not displayed]

FINDINGS: Endotracheal tube is in place with tip 3 cm above the
carina.  Enteric tube is in place with distal portion entering the
stomach area but tip is not included on the image.  Right internal
jugular Swan-Ganz catheter is in place with tip in the proximal
main pulmonary artery or outflow tract area. No pneumothorax is
evident. Previous median sternotomy and coronary artery bypass
grafting have been performed.  There is upper lobe vessel
prominence pulmonary vascular pulmonary venous hypertension
congestion pattern.  Hazy infiltrative densities are seen in both
lungs with slightly denser confluent opacity on the right compared
to the left.  There are bilateral pleural effusions more on the
right than the left.  There is enlargement of the cardiac
silhouette unchanged previous study.
IMPRESSION: The support apparatus as discussed above.  There is upper lobe
vessel prominence pulmonary vascular pulmonary venous hypertension
congestion pattern.  Hazy infiltrative densities are seen in both
lungs with slightly denser confluent opacity on the right compared
to the left.  There are bilateral pleural effusions more on the
right than the left.  There is enlargement of the cardiac
silhouette unchanged previous study.

.

## 2010-08-08 IMAGING — CR DG ABD PORTABLE 1V
1 series · 1 of 1 positions shown · non-contrast
Comparison: None

CLINICAL DATA: Panda tube placement.  Acute myocardial infarction.

ABDOMEN - 1 VIEW

[view not recorded]
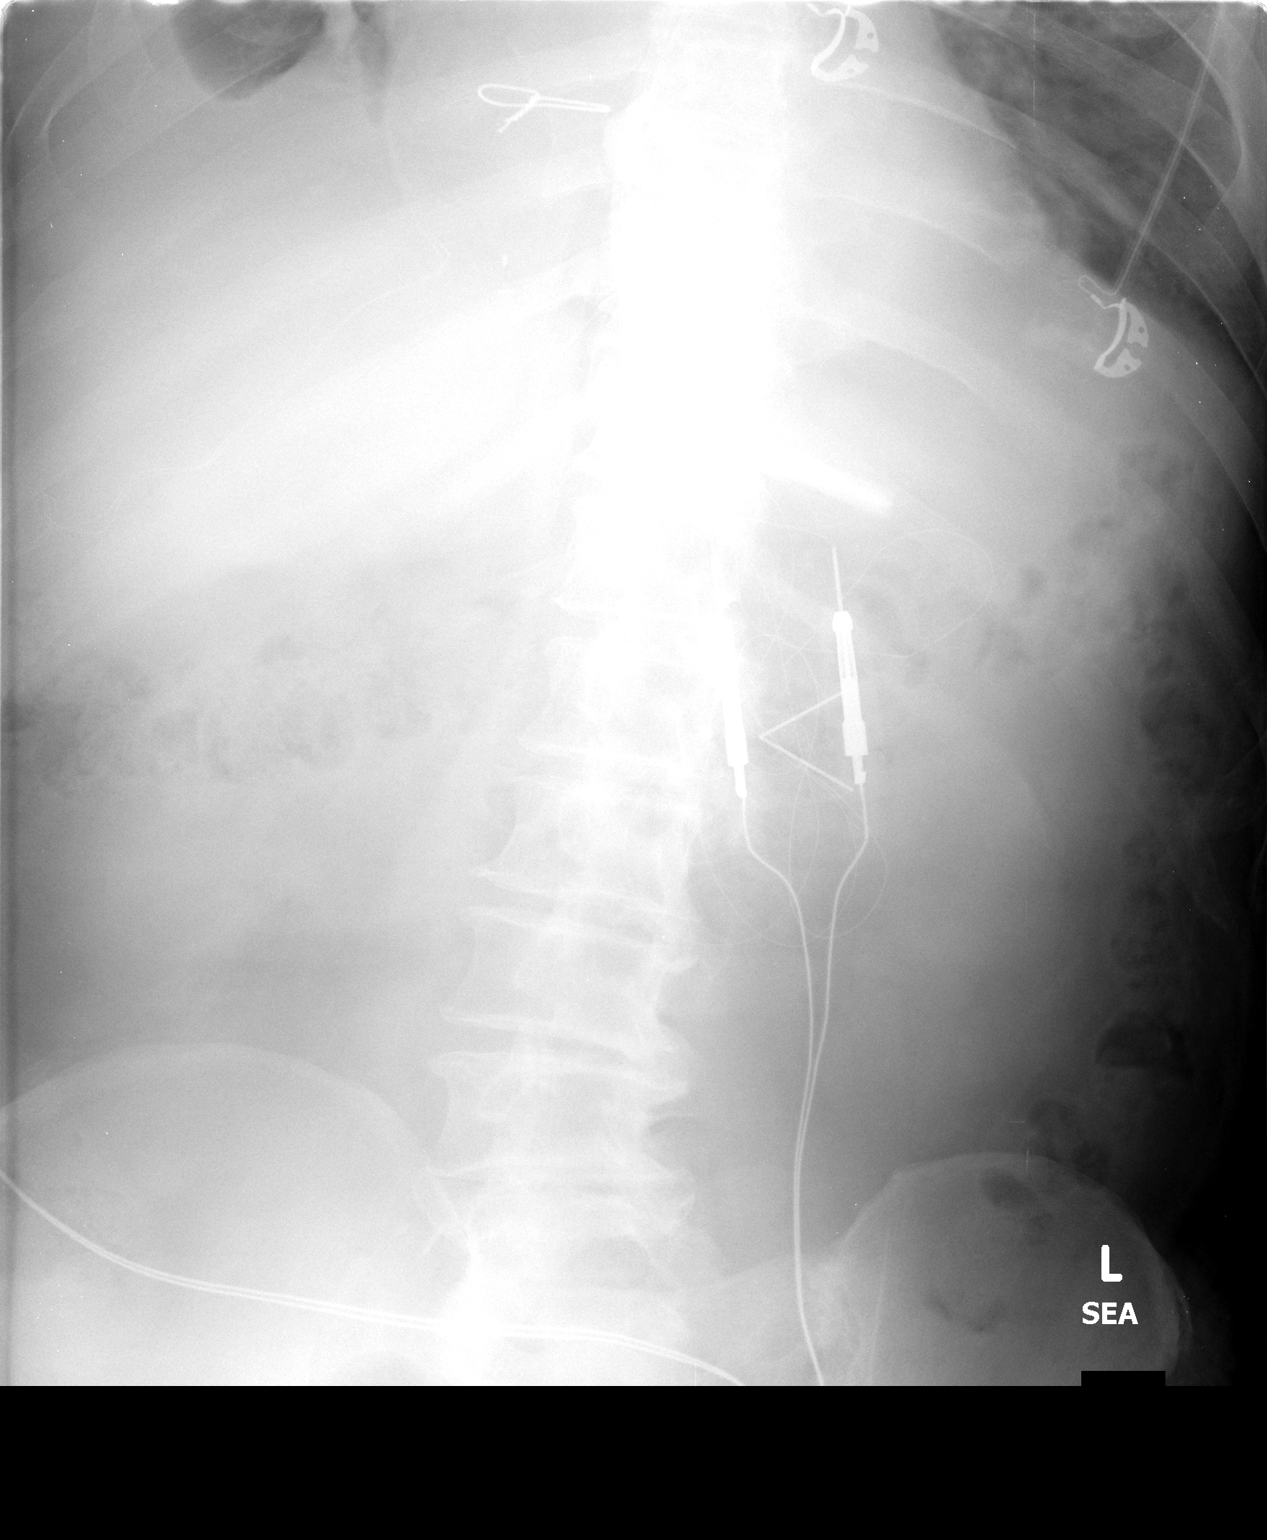

[1 of 1 positions shown; findings below may reference images not displayed]

FINDINGS: Panda tube has been inserted and  the tip is in the
region of the fundus of the stomach.  The tube could be advanced 10-
15 cm.

There is only a small amount of air in the nondistended colon.

Electronic device lies over the mid abdomen.  There is a right
pleural effusion.
IMPRESSION: Panda tube tip is in the fundus the stomach.  The tube could be
advanced 10-15 cm.

## 2010-08-11 IMAGING — CR DG CHEST 1V PORT
1 series · 1 of 1 positions shown · non-contrast
Comparison: 09/05/2008

CLINICAL DATA: CABG.

PORTABLE CHEST - 1 VIEW

[view not recorded]
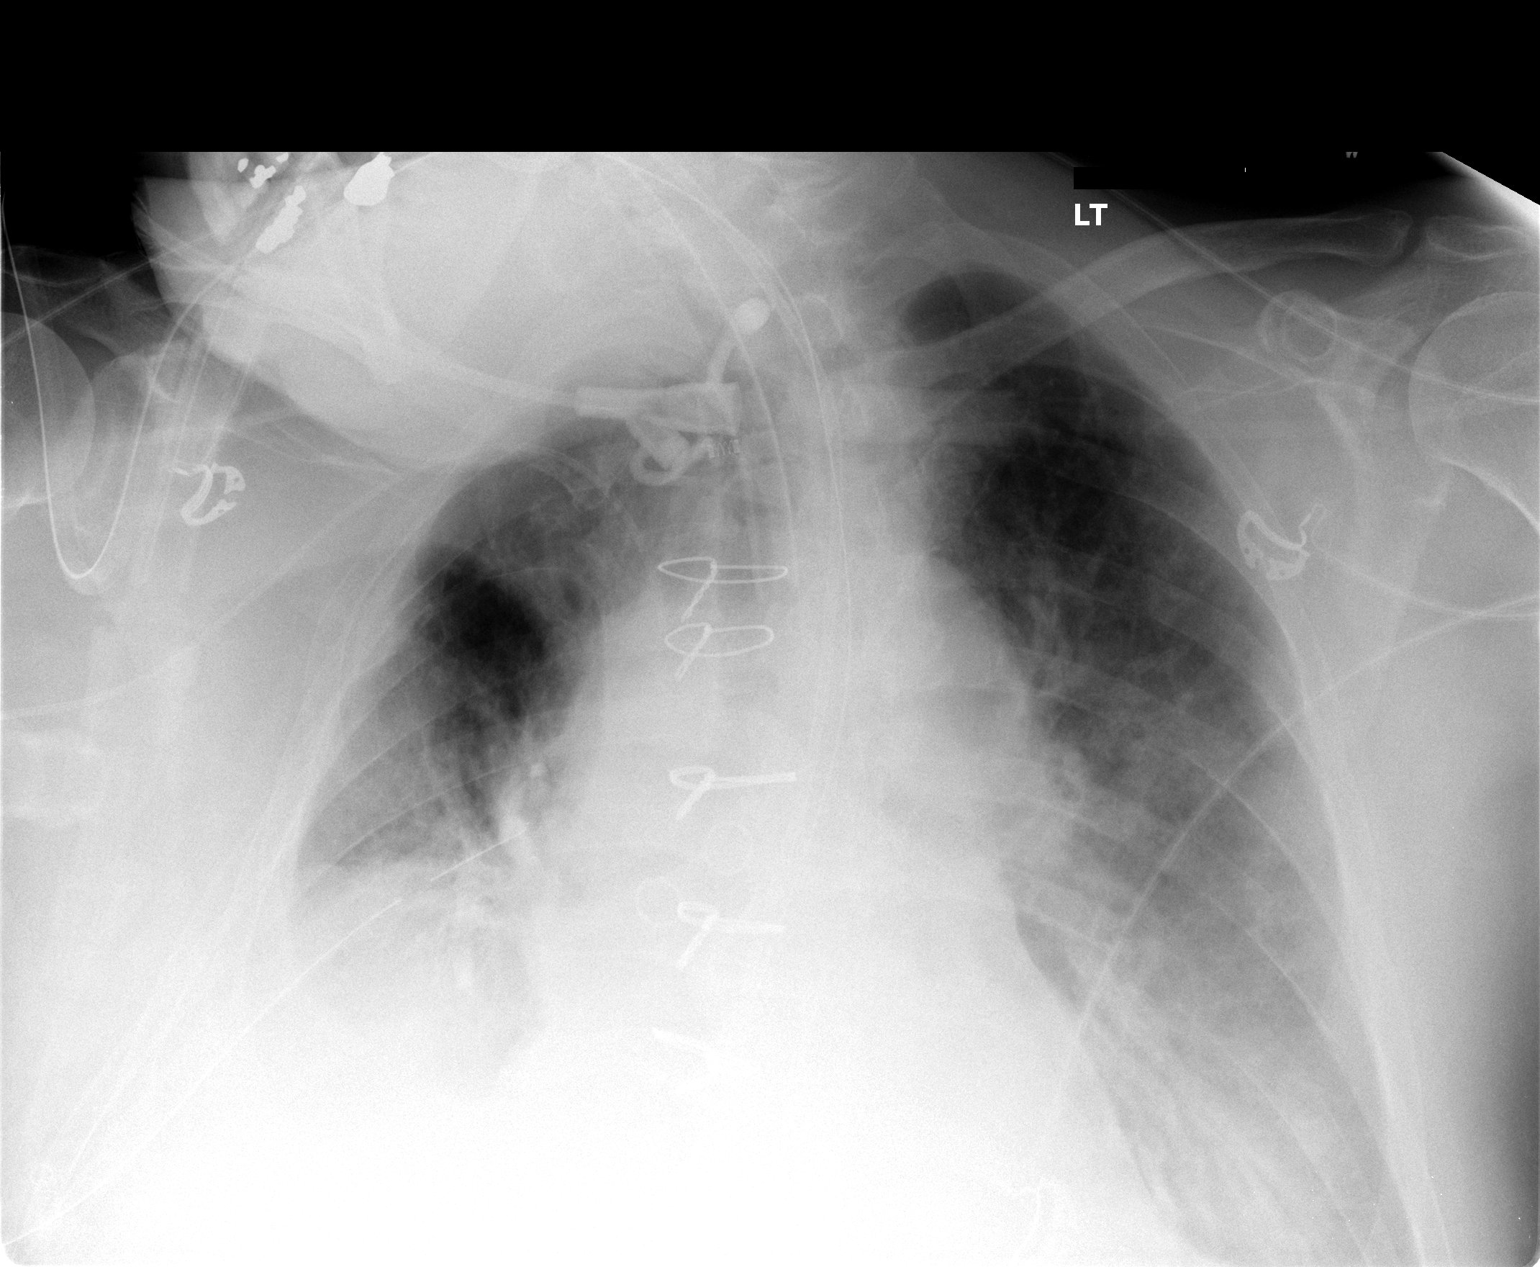

[1 of 1 positions shown; findings below may reference images not displayed]

FINDINGS: Support devices are in stable position.  Right basilar
chest tube remains in place.  No pneumothorax.  Appearance of the
lungs is stable with right effusion and right lower lobe
atelectasis.  Left base atelectasis also stable.  Heart is mildly
enlarged.  The patient is status post CABG.
IMPRESSION: No significant change.  No pneumothorax.

## 2010-08-13 IMAGING — CR DG CHEST 1V PORT
1 series · 1 of 1 positions shown · non-contrast
Comparison: 09/07/2008

CLINICAL DATA: Coronary bypass, ventilatory support

PORTABLE CHEST - 1 VIEW

[AP]
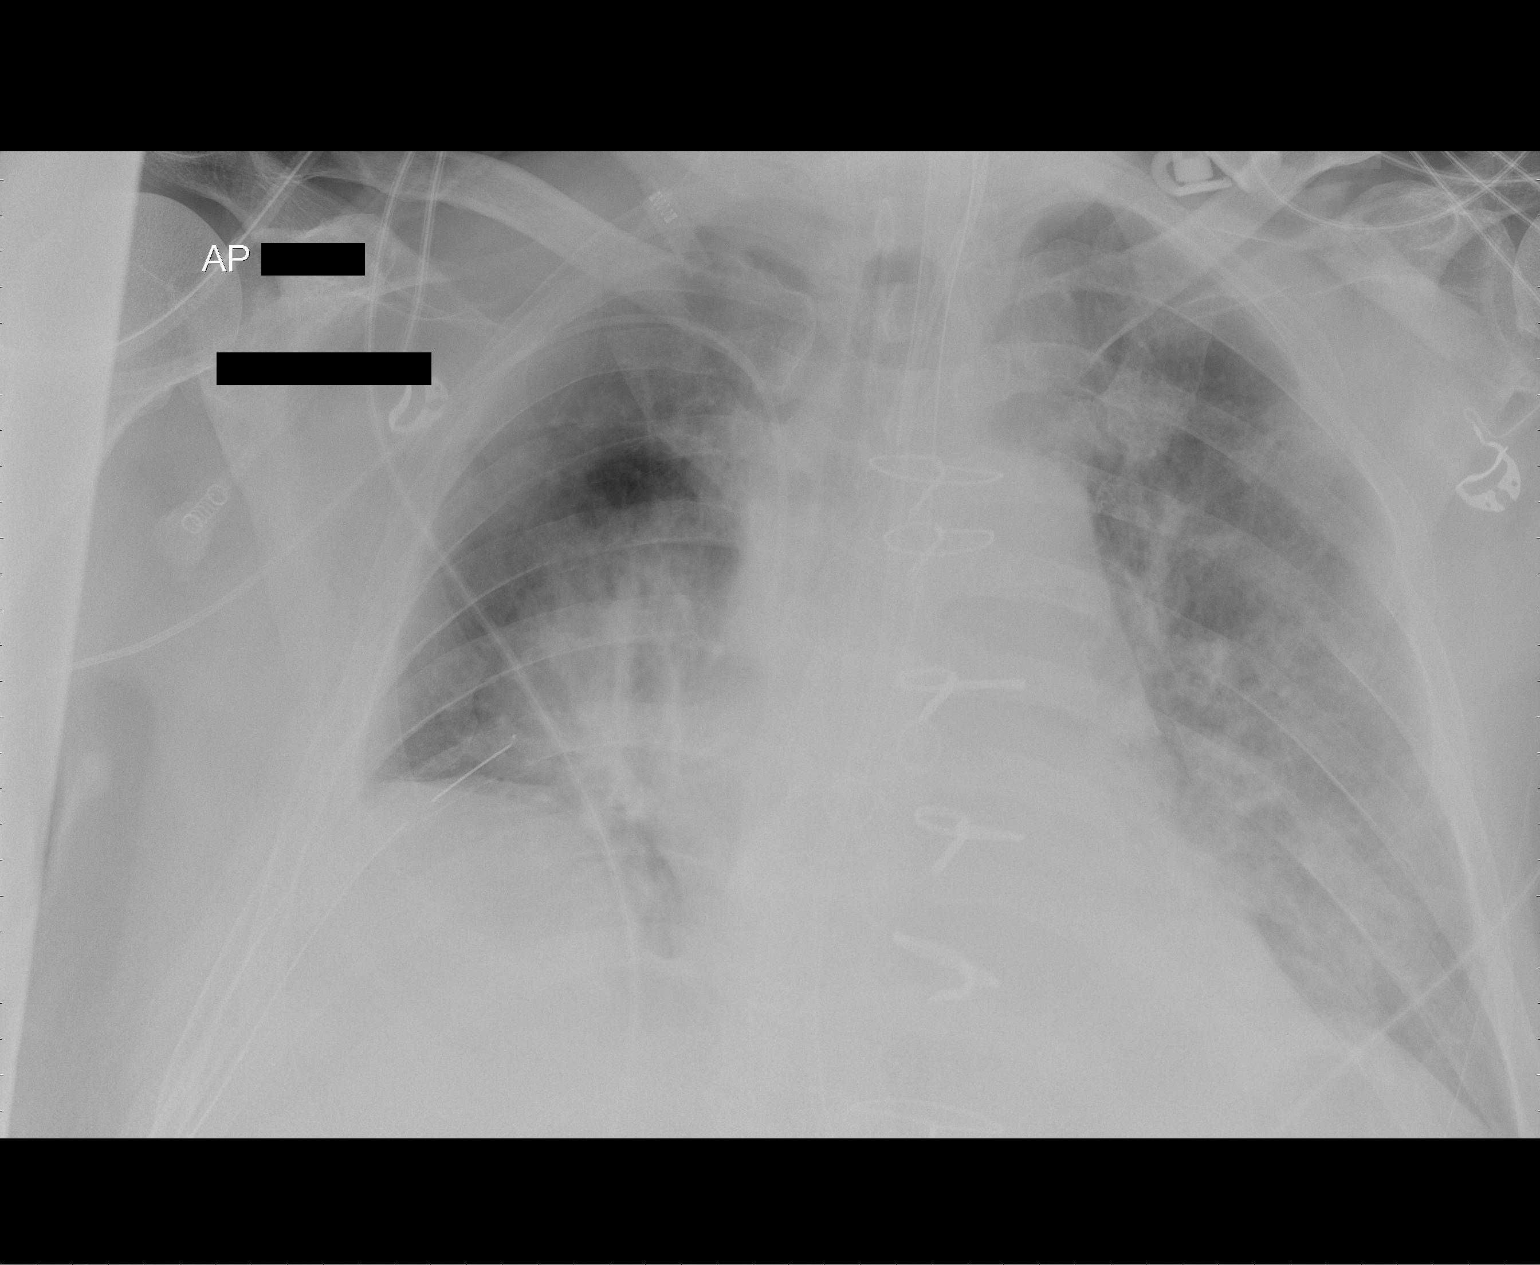

[1 of 1 positions shown; findings below may reference images not displayed]

FINDINGS: No changes in support apparatus.  All stable in position.
Right hilar and left lower lobe consolidation versus asymmetric
edema persist.  No large effusion or pneumothorax by plain
radiography.  Overall stable exam given changes in technique.
IMPRESSION: Stable right hilar and left lower lobe consolidation versus
asymmetric edema.

## 2010-08-15 IMAGING — CR DG CHEST 1V PORT
1 series · 1 of 1 positions shown · non-contrast
Comparison: [DATE]

CLINICAL DATA: Respiratory distress, ventilatory support

PORTABLE CHEST - 1 VIEW

[view not recorded]
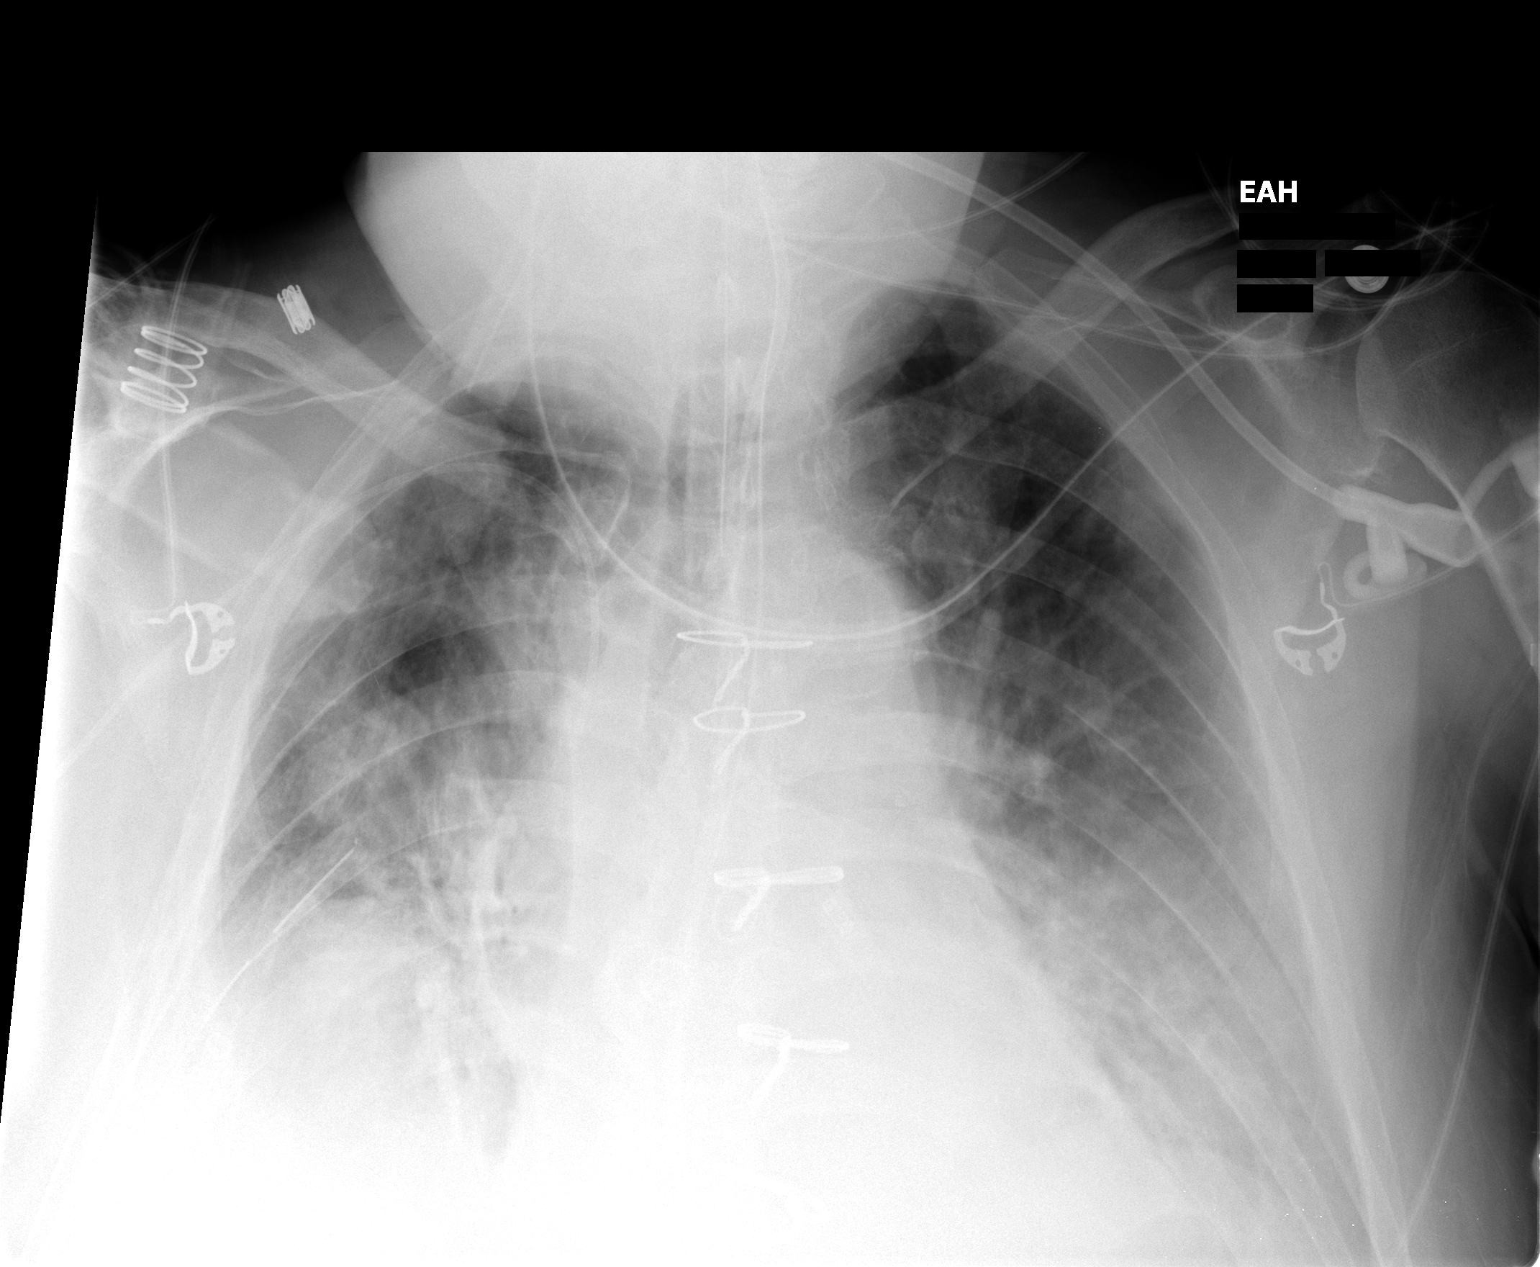

[1 of 1 positions shown; findings below may reference images not displayed]

FINDINGS: No changes in support apparatus.  Endotracheal tube
remains 4 cm above the carina.  Stable diffuse airspace disease
versus edema with basilar atelectasis.  No large pneumothorax.
Left lung base is partially excluded on the study
IMPRESSION: Stable chest exam

## 2010-09-04 ENCOUNTER — Ambulatory Visit (INDEPENDENT_AMBULATORY_CARE_PROVIDER_SITE_OTHER): Payer: Medicare Other | Admitting: Cardiology

## 2010-09-04 ENCOUNTER — Encounter: Payer: Self-pay | Admitting: Cardiology

## 2010-09-04 DIAGNOSIS — I10 Essential (primary) hypertension: Secondary | ICD-10-CM

## 2010-09-04 DIAGNOSIS — I2510 Atherosclerotic heart disease of native coronary artery without angina pectoris: Secondary | ICD-10-CM

## 2010-09-04 DIAGNOSIS — E7800 Pure hypercholesterolemia, unspecified: Secondary | ICD-10-CM

## 2010-09-09 LAB — BASIC METABOLIC PANEL
BUN: 17 mg/dL (ref 6–23)
BUN: 21 mg/dL (ref 6–23)
Calcium: 8.4 mg/dL (ref 8.4–10.5)
Chloride: 101 mEq/L (ref 96–112)
Creatinine, Ser: 1.25 mg/dL (ref 0.4–1.5)
GFR calc non Af Amer: 56 mL/min — ABNORMAL LOW (ref >60)
GFR calc non Af Amer: >60 mL/min (ref >60)
Glucose, Bld: 138 mg/dL — ABNORMAL HIGH (ref 70–99)
Potassium: 4.5 mEq/L (ref 3.5–5.1)
Potassium: 5.1 mEq/L (ref 3.5–5.1)
Sodium: 134 mEq/L — ABNORMAL LOW (ref 135–145)
Sodium: 136 mEq/L (ref 135–145)

## 2010-09-09 LAB — TYPE AND SCREEN
ABO/RH(D): O POS
Antibody Screen: NEGATIVE

## 2010-09-09 LAB — CBC
HCT: 27.7 % — ABNORMAL LOW (ref 39.0–52.0)
HCT: 29.6 % — ABNORMAL LOW (ref 39.0–52.0)
Hemoglobin: 10 g/dL — ABNORMAL LOW (ref 13.0–17.0)
Hemoglobin: 9.4 g/dL — ABNORMAL LOW (ref 13.0–17.0)
MCV: 100 fL (ref 78.0–100.0)
Platelets: 162 10*3/uL (ref 150–400)
Platelets: 184 10*3/uL (ref 150–400)
WBC: 12.1 10*3/uL — ABNORMAL HIGH (ref 4.0–10.5)
WBC: 16.4 10*3/uL — ABNORMAL HIGH (ref 4.0–10.5)

## 2010-09-09 LAB — ABO/RH: ABO/RH(D): O POS

## 2010-09-09 LAB — URINE CULTURE
Colony Count: NO GROWTH
Culture: NO GROWTH

## 2010-09-09 LAB — URINALYSIS, ROUTINE W REFLEX MICROSCOPIC
Glucose, UA: NEGATIVE mg/dL
Hgb urine dipstick: NEGATIVE
Ketones, ur: NEGATIVE mg/dL
Nitrite: NEGATIVE
Protein, ur: NEGATIVE mg/dL
Specific Gravity, Urine: 1.024 (ref 1.005–1.030)
Urobilinogen, UA: 0.2 mg/dL (ref 0.0–1.0)

## 2010-09-10 ENCOUNTER — Other Ambulatory Visit (INDEPENDENT_AMBULATORY_CARE_PROVIDER_SITE_OTHER): Payer: Medicare Other

## 2010-09-10 DIAGNOSIS — E785 Hyperlipidemia, unspecified: Secondary | ICD-10-CM

## 2010-09-10 DIAGNOSIS — T887XXA Unspecified adverse effect of drug or medicament, initial encounter: Secondary | ICD-10-CM

## 2010-09-10 DIAGNOSIS — E039 Hypothyroidism, unspecified: Secondary | ICD-10-CM

## 2010-09-10 LAB — BASIC METABOLIC PANEL
CO2: 29 mEq/L (ref 19–32)
Chloride: 102 mEq/L (ref 96–112)
GFR calc Af Amer: >60 mL/min (ref >60)
Glucose, Bld: 109 mg/dL — ABNORMAL HIGH (ref 70–99)
Sodium: 139 mEq/L (ref 135–145)

## 2010-09-10 LAB — URINALYSIS, ROUTINE W REFLEX MICROSCOPIC
Bilirubin Urine: NEGATIVE
Glucose, UA: NEGATIVE mg/dL
Hgb urine dipstick: NEGATIVE
Protein, ur: NEGATIVE mg/dL

## 2010-09-10 LAB — DIFFERENTIAL
Basophils Relative: 1 % (ref 0–1)
Eosinophils Absolute: 0.4 10*3/uL (ref 0.0–0.7)
Eosinophils Relative: 5 % (ref 0–5)
Monocytes Absolute: 0.9 10*3/uL (ref 0.1–1.0)
Monocytes Relative: 12 % (ref 3–12)

## 2010-09-10 LAB — CBC
Hemoglobin: 14.2 g/dL (ref 13.0–17.0)
MCHC: 33.3 g/dL (ref 30.0–36.0)
MCV: 99.5 fL (ref 78.0–100.0)
RBC: 4.28 MIL/uL (ref 4.22–5.81)

## 2010-09-10 LAB — LIPID PANEL
Cholesterol: 113 mg/dL (ref 0–200)
HDL: 34.3 mg/dL — ABNORMAL LOW (ref >39.00)
LDL Cholesterol: 50 mg/dL (ref 0–99)
Total CHOL/HDL Ratio: 3
Triglycerides: 146 mg/dL (ref 0.0–149.0)

## 2010-09-10 LAB — HEPATIC FUNCTION PANEL
ALT: 26 U/L (ref 0–53)
Bilirubin, Direct: 0.1 mg/dL (ref 0.0–0.3)
Total Bilirubin: 0.5 mg/dL (ref 0.3–1.2)

## 2010-09-10 LAB — CREATININE, SERUM: GFR calc non Af Amer: 53 mL/min — ABNORMAL LOW (ref >60)

## 2010-09-16 ENCOUNTER — Encounter: Payer: Self-pay | Admitting: Internal Medicine

## 2010-09-17 ENCOUNTER — Encounter: Payer: Self-pay | Admitting: Internal Medicine

## 2010-09-17 ENCOUNTER — Ambulatory Visit: Payer: Medicare Other | Admitting: Internal Medicine

## 2010-09-17 DIAGNOSIS — E039 Hypothyroidism, unspecified: Secondary | ICD-10-CM

## 2010-09-17 DIAGNOSIS — E7800 Pure hypercholesterolemia, unspecified: Secondary | ICD-10-CM

## 2010-09-17 DIAGNOSIS — I10 Essential (primary) hypertension: Secondary | ICD-10-CM

## 2010-09-17 NOTE — Progress Notes (Signed)
  Subjective:    Patient ID: Zachary Decker, male    DOB: [birth year], 77 y.o.   MRN: [id number]  HPI    Review of Systems     Objective:   Physical Exam        Assessment & Plan:

## 2010-09-19 NOTE — Assessment & Plan Note (Signed)
Summary: 15month   Visit Type:  Follow-up Primary Provider:  Stacie Glaze MD  CC:  no complaits.  History of Present Illness: Has an appointment for bloodwork with Dr.Jenkins to get blood work.  He feels great.  He is playing golf, and is "terrible", but it is a good type of "tired".  He feels stronger, sleeping well at night, and has not had any chest pain or fluid build up.  Golf has helped him alot.   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) 11)  Hypertension  (ICD-401.9) 12)  Hypercholesterolemia  (ICD-272.0) 13)  Pvd  (ICD-443.9) 14)  Deep Venous Thrombophlebitis, Hx of  (ICD-V12.52) 15)  Encounter For Long-term Use of Other Medications  (ICD-V58.69) 16)  Respiratory Failure  (ICD-518.81) 17)  Gerd  (ICD-530.81) 18)  Benign Prostatic Hypertrophy, With Urinary Obstruction  (ICD-600.01) 19)  Advef, Drug/medicinal/biological Subst Nos  (ICD-995.20) 20)  Hypothyroidism  (ICD-244.9) 21)  Family History of Colon Ca 1st Degree Relative <60  (ICD-V16.0)  Current Medications (verified): 1)  Levothyroxine Sodium 50 Mcg Tabs (Levothyroxine Sodium) .... One By Mouth Daily 2)  Aspirin 81 Mg Tbec (Aspirin) .... Take One Tablet By Mouth Daily 3)  Flomax 0.4 Mg Xr24h-Cap (Tamsulosin Hcl) .... Take 1 Capsule By Mouth Once A Day 4)  Crestor 5 Mg Tabs (Rosuvastatin Calcium) .... One By Mouth Daily 5)  Lansoprazole 30 Mg Cpdr (Lansoprazole) .... One By Mouth Daily 6)  Centrum Silver  Tabs (Multiple Vitamins-Minerals) .... Take  1 Tablet By Mouth Once A Day 7)  Carvedilol 3.125 Mg Tabs (Carvedilol) .... One By Mouth Two Times A Day 8)  Lisinopril 10 Mg Tabs (Lisinopril) .... Take One Tablet By Mouth Daily 9)  Folbee Plus  Tabs (B Complex-C-Folic Acid) .... One By Mouth Daily  Allergies (verified): 1)  ! Pcn 2)  ! Cozaar 3)  ! 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   tracheostomy placement       Family History: Last updated: 11/17/2008  Positive CAD.      Social History: Last updated: 11/17/2008  Married, retired.  Wife can assist as needed.  Daughter   can also assist, one-level home and one step to enter.    Nonsmoker  Risk Factors: Smoking Status: never (07/22/2010)  Vital Signs:  Patient profile:   77 year old male Height:      74 inches Weight:      193.25 pounds BMI:     24.90 Pulse rate:   60 / minute BP sitting:  140 / 80  (left arm) Cuff size:   regular  Vitals Entered By: Caralee Ates CMA (September 04, 2010 8:59 AM)  Physical Exam  General:  Well developed, well nourished, in no acute distress. Head:  normocephalic and atraumatic Eyes:  PERRLA/EOM intact; conjunctiva and lids normal. Lungs:  Clear bilaterally to auscultation and  percussion. Heart:  PMI non displaced. Normal S1 and S2.  No murmur or rub.   Extremities:  No clubbing or cyanosis. Neurologic:  Alert and oriented x 3.   Impression & Recommendations:  Problem # 1:  CARDIOMYOPATHY, ISCHEMIC (ICD-414.8) Continues to do well.  EF is above need for ICD at this point.  Functional class is I/II.  Continue to monitor and repeat echo in one year.  His updated medication list for this problem includes:    Aspirin 81 Mg Tbec (Aspirin) .Marland Kitchen... Take one tablet by mouth daily    Carvedilol 3.125 Mg Tabs (Carvedilol) ..... One by mouth two times a day    Lisinopril 10 Mg Tabs (Lisinopril) .Marland Kitchen... Take one tablet by mouth daily  Problem # 2:  CAD, ARTERY BYPASS GRAFT (ICD-414.04) stable. Will see back in one year since he is following so closely with Dr. Lovell Sheehan. His updated medication list for this problem includes:    Aspirin 81 Mg Tbec (Aspirin) .Marland Kitchen... Take one tablet by mouth daily    Carvedilol 3.125 Mg Tabs (Carvedilol) ..... One by mouth two times a day    Lisinopril 10 Mg Tabs (Lisinopril) .Marland Kitchen... Take one tablet by mouth daily  Problem # 3:  HYPERTENSION (ICD-401.9) reasonable control.  Follow up with Dr. Lovell Sheehan. His updated medication list for this problem includes:    Aspirin 81 Mg Tbec (Aspirin) .Marland Kitchen... Take one tablet by mouth daily    Carvedilol 3.125 Mg Tabs (Carvedilol) ..... One by mouth two times a day    Lisinopril 10 Mg Tabs (Lisinopril) .Marland Kitchen... Take one tablet by mouth daily  Patient Instructions: 1)  Your physician recommends that you continue on your current medications as directed. Please refer to the Current Medication list given to you today. 2)  Your physician wants you to follow-up in: 1 YEAR.   You will receive a reminder letter in the mail two months in advance. If you don't receive a letter, please call our office to schedule the follow-up appointment. 3)  Your physician has requested that you have an echocardiogram in 1 YEAR.   Echocardiography is a painless test that uses sound waves to create images of your heart. It provides your doctor with information about the size and shape of your heart and how well your heart's chambers and valves are working.  This procedure takes approximately one hour. There are no restrictions for this procedure.

## 2010-10-01 LAB — BASIC METABOLIC PANEL
Chloride: 99 mEq/L (ref 96–112)
Creatinine, Ser: 1.23 mg/dL (ref 0.4–1.5)
GFR calc Af Amer: >60 mL/min (ref >60)
Potassium: 3.9 mEq/L (ref 3.5–5.1)
Sodium: 133 mEq/L — ABNORMAL LOW (ref 135–145)

## 2010-10-02 LAB — URINE MICROSCOPIC-ADD ON

## 2010-10-02 LAB — CBC
HCT: 28.9 % — ABNORMAL LOW (ref 39.0–52.0)
HCT: 29 % — ABNORMAL LOW (ref 39.0–52.0)
HCT: 30 % — ABNORMAL LOW (ref 39.0–52.0)
HCT: 30.6 % — ABNORMAL LOW (ref 39.0–52.0)
HCT: 30.7 % — ABNORMAL LOW (ref 39.0–52.0)
HCT: 32 % — ABNORMAL LOW (ref 39.0–52.0)
HCT: 32.7 % — ABNORMAL LOW (ref 39.0–52.0)
HCT: 33.4 % — ABNORMAL LOW (ref 39.0–52.0)
HCT: 33.5 % — ABNORMAL LOW (ref 39.0–52.0)
HCT: 34 % — ABNORMAL LOW (ref 39.0–52.0)
HCT: 35.2 % — ABNORMAL LOW (ref 39.0–52.0)
Hemoglobin: 10 g/dL — ABNORMAL LOW (ref 13.0–17.0)
Hemoglobin: 10.1 g/dL — ABNORMAL LOW (ref 13.0–17.0)
Hemoglobin: 11.1 g/dL — ABNORMAL LOW (ref 13.0–17.0)
Hemoglobin: 10.2 g/dL — ABNORMAL LOW (ref 13.0–17.0)
Hemoglobin: 10.3 g/dL — ABNORMAL LOW (ref 13.0–17.0)
Hemoglobin: 11 g/dL — ABNORMAL LOW (ref 13.0–17.0)
Hemoglobin: 11.1 g/dL — ABNORMAL LOW (ref 13.0–17.0)
Hemoglobin: 11.1 g/dL — ABNORMAL LOW (ref 13.0–17.0)
Hemoglobin: 11.6 g/dL — ABNORMAL LOW (ref 13.0–17.0)
Hemoglobin: 11.8 g/dL — ABNORMAL LOW (ref 13.0–17.0)
MCHC: 33.8 g/dL (ref 30.0–36.0)
MCHC: 34.7 g/dL (ref 30.0–36.0)
MCHC: 34.9 g/dL (ref 30.0–36.0)
MCHC: 33.2 g/dL (ref 30.0–36.0)
MCHC: 33.2 g/dL (ref 30.0–36.0)
MCHC: 33.4 g/dL (ref 30.0–36.0)
MCHC: 33.4 g/dL (ref 30.0–36.0)
MCHC: 33.5 g/dL (ref 30.0–36.0)
MCHC: 33.7 g/dL (ref 30.0–36.0)
MCHC: 34 g/dL (ref 30.0–36.0)
MCV: 93.2 fL (ref 78.0–100.0)
MCV: 93.2 fL (ref 78.0–100.0)
MCV: 93.9 fL (ref 78.0–100.0)
MCV: 93.9 fL (ref 78.0–100.0)
MCV: 93.9 fL (ref 78.0–100.0)
MCV: 94.2 fL (ref 78.0–100.0)
MCV: 94.8 fL (ref 78.0–100.0)
MCV: 95.1 fL (ref 78.0–100.0)
Platelets: 254 10*3/uL (ref 150–400)
Platelets: 292 10*3/uL (ref 150–400)
Platelets: 294 10*3/uL (ref 150–400)
Platelets: 309 10*3/uL (ref 150–400)
Platelets: 310 10*3/uL (ref 150–400)
Platelets: 327 10*3/uL (ref 150–400)
Platelets: 382 10*3/uL (ref 150–400)
RBC: 3.07 MIL/uL — ABNORMAL LOW (ref 4.22–5.81)
RBC: 3.09 MIL/uL — ABNORMAL LOW (ref 4.22–5.81)
RBC: 3.43 MIL/uL — ABNORMAL LOW (ref 4.22–5.81)
RBC: 3.29 MIL/uL — ABNORMAL LOW (ref 4.22–5.81)
RBC: 3.49 MIL/uL — ABNORMAL LOW (ref 4.22–5.81)
RBC: 3.52 MIL/uL — ABNORMAL LOW (ref 4.22–5.81)
RBC: 3.65 MIL/uL — ABNORMAL LOW (ref 4.22–5.81)
RDW: 18.5 % — ABNORMAL HIGH (ref 11.5–15.5)
RDW: 17 % — ABNORMAL HIGH (ref 11.5–15.5)
RDW: 17.2 % — ABNORMAL HIGH (ref 11.5–15.5)
RDW: 17.5 % — ABNORMAL HIGH (ref 11.5–15.5)
RDW: 17.6 % — ABNORMAL HIGH (ref 11.5–15.5)
RDW: 18.3 % — ABNORMAL HIGH (ref 11.5–15.5)
RDW: 18.5 % — ABNORMAL HIGH (ref 11.5–15.5)
RDW: 18.8 % — ABNORMAL HIGH (ref 11.5–15.5)
RDW: 18.9 % — ABNORMAL HIGH (ref 11.5–15.5)
RDW: 18.9 % — ABNORMAL HIGH (ref 11.5–15.5)
WBC: 11.2 10*3/uL — ABNORMAL HIGH (ref 4.0–10.5)
WBC: 12.7 10*3/uL — ABNORMAL HIGH (ref 4.0–10.5)
WBC: 13.6 10*3/uL — ABNORMAL HIGH (ref 4.0–10.5)
WBC: 14.5 10*3/uL — ABNORMAL HIGH (ref 4.0–10.5)
WBC: 14.8 10*3/uL — ABNORMAL HIGH (ref 4.0–10.5)
WBC: 15.3 10*3/uL — ABNORMAL HIGH (ref 4.0–10.5)

## 2010-10-02 LAB — GLUCOSE, CAPILLARY
Glucose-Capillary: 100 mg/dL — ABNORMAL HIGH (ref 70–99)
Glucose-Capillary: 103 mg/dL — ABNORMAL HIGH (ref 70–99)
Glucose-Capillary: 106 mg/dL — ABNORMAL HIGH (ref 70–99)
Glucose-Capillary: 106 mg/dL — ABNORMAL HIGH (ref 70–99)
Glucose-Capillary: 108 mg/dL — ABNORMAL HIGH (ref 70–99)
Glucose-Capillary: 108 mg/dL — ABNORMAL HIGH (ref 70–99)
Glucose-Capillary: 112 mg/dL — ABNORMAL HIGH (ref 70–99)
Glucose-Capillary: 113 mg/dL — ABNORMAL HIGH (ref 70–99)
Glucose-Capillary: 116 mg/dL — ABNORMAL HIGH (ref 70–99)
Glucose-Capillary: 119 mg/dL — ABNORMAL HIGH (ref 70–99)
Glucose-Capillary: 120 mg/dL — ABNORMAL HIGH (ref 70–99)
Glucose-Capillary: 134 mg/dL — ABNORMAL HIGH (ref 70–99)
Glucose-Capillary: 135 mg/dL — ABNORMAL HIGH (ref 70–99)
Glucose-Capillary: 136 mg/dL — ABNORMAL HIGH (ref 70–99)
Glucose-Capillary: 147 mg/dL — ABNORMAL HIGH (ref 70–99)
Glucose-Capillary: 148 mg/dL — ABNORMAL HIGH (ref 70–99)
Glucose-Capillary: 149 mg/dL — ABNORMAL HIGH (ref 70–99)
Glucose-Capillary: 151 mg/dL — ABNORMAL HIGH (ref 70–99)
Glucose-Capillary: 153 mg/dL — ABNORMAL HIGH (ref 70–99)
Glucose-Capillary: 156 mg/dL — ABNORMAL HIGH (ref 70–99)
Glucose-Capillary: 92 mg/dL (ref 70–99)
Glucose-Capillary: 94 mg/dL (ref 70–99)
Glucose-Capillary: 94 mg/dL (ref 70–99)
Glucose-Capillary: 94 mg/dL (ref 70–99)
Glucose-Capillary: 95 mg/dL (ref 70–99)
Glucose-Capillary: 98 mg/dL (ref 70–99)
Glucose-Capillary: 98 mg/dL (ref 70–99)
Glucose-Capillary: 98 mg/dL (ref 70–99)
Glucose-Capillary: 98 mg/dL (ref 70–99)
Glucose-Capillary: 99 mg/dL (ref 70–99)
Glucose-Capillary: 100 mg/dL — ABNORMAL HIGH (ref 70–99)
Glucose-Capillary: 102 mg/dL — ABNORMAL HIGH (ref 70–99)
Glucose-Capillary: 103 mg/dL — ABNORMAL HIGH (ref 70–99)
Glucose-Capillary: 108 mg/dL — ABNORMAL HIGH (ref 70–99)
Glucose-Capillary: 108 mg/dL — ABNORMAL HIGH (ref 70–99)
Glucose-Capillary: 110 mg/dL — ABNORMAL HIGH (ref 70–99)
Glucose-Capillary: 111 mg/dL — ABNORMAL HIGH (ref 70–99)
Glucose-Capillary: 112 mg/dL — ABNORMAL HIGH (ref 70–99)
Glucose-Capillary: 115 mg/dL — ABNORMAL HIGH (ref 70–99)
Glucose-Capillary: 115 mg/dL — ABNORMAL HIGH (ref 70–99)
Glucose-Capillary: 117 mg/dL — ABNORMAL HIGH (ref 70–99)
Glucose-Capillary: 117 mg/dL — ABNORMAL HIGH (ref 70–99)
Glucose-Capillary: 117 mg/dL — ABNORMAL HIGH (ref 70–99)
Glucose-Capillary: 117 mg/dL — ABNORMAL HIGH (ref 70–99)
Glucose-Capillary: 118 mg/dL — ABNORMAL HIGH (ref 70–99)
Glucose-Capillary: 119 mg/dL — ABNORMAL HIGH (ref 70–99)
Glucose-Capillary: 120 mg/dL — ABNORMAL HIGH (ref 70–99)
Glucose-Capillary: 123 mg/dL — ABNORMAL HIGH (ref 70–99)
Glucose-Capillary: 123 mg/dL — ABNORMAL HIGH (ref 70–99)
Glucose-Capillary: 123 mg/dL — ABNORMAL HIGH (ref 70–99)
Glucose-Capillary: 123 mg/dL — ABNORMAL HIGH (ref 70–99)
Glucose-Capillary: 123 mg/dL — ABNORMAL HIGH (ref 70–99)
Glucose-Capillary: 124 mg/dL — ABNORMAL HIGH (ref 70–99)
Glucose-Capillary: 124 mg/dL — ABNORMAL HIGH (ref 70–99)
Glucose-Capillary: 125 mg/dL — ABNORMAL HIGH (ref 70–99)
Glucose-Capillary: 126 mg/dL — ABNORMAL HIGH (ref 70–99)
Glucose-Capillary: 128 mg/dL — ABNORMAL HIGH (ref 70–99)
Glucose-Capillary: 130 mg/dL — ABNORMAL HIGH (ref 70–99)
Glucose-Capillary: 131 mg/dL — ABNORMAL HIGH (ref 70–99)
Glucose-Capillary: 132 mg/dL — ABNORMAL HIGH (ref 70–99)
Glucose-Capillary: 132 mg/dL — ABNORMAL HIGH (ref 70–99)
Glucose-Capillary: 133 mg/dL — ABNORMAL HIGH (ref 70–99)
Glucose-Capillary: 135 mg/dL — ABNORMAL HIGH (ref 70–99)
Glucose-Capillary: 135 mg/dL — ABNORMAL HIGH (ref 70–99)
Glucose-Capillary: 137 mg/dL — ABNORMAL HIGH (ref 70–99)
Glucose-Capillary: 138 mg/dL — ABNORMAL HIGH (ref 70–99)
Glucose-Capillary: 138 mg/dL — ABNORMAL HIGH (ref 70–99)
Glucose-Capillary: 139 mg/dL — ABNORMAL HIGH (ref 70–99)
Glucose-Capillary: 140 mg/dL — ABNORMAL HIGH (ref 70–99)
Glucose-Capillary: 145 mg/dL — ABNORMAL HIGH (ref 70–99)
Glucose-Capillary: 150 mg/dL — ABNORMAL HIGH (ref 70–99)
Glucose-Capillary: 155 mg/dL — ABNORMAL HIGH (ref 70–99)
Glucose-Capillary: 167 mg/dL — ABNORMAL HIGH (ref 70–99)

## 2010-10-02 LAB — URINALYSIS, ROUTINE W REFLEX MICROSCOPIC
Bilirubin Urine: NEGATIVE
Glucose, UA: NEGATIVE mg/dL
Glucose, UA: NEGATIVE mg/dL
Ketones, ur: NEGATIVE mg/dL
Leukocytes, UA: NEGATIVE
Protein, ur: 100 mg/dL — AB
Protein, ur: NEGATIVE mg/dL
Specific Gravity, Urine: 1.009 (ref 1.005–1.030)
Urobilinogen, UA: 1 mg/dL (ref 0.0–1.0)
pH: 7.5 (ref 5.0–8.0)

## 2010-10-02 LAB — BASIC METABOLIC PANEL
BUN: 30 mg/dL — ABNORMAL HIGH (ref 6–23)
BUN: 35 mg/dL — ABNORMAL HIGH (ref 6–23)
BUN: 51 mg/dL — ABNORMAL HIGH (ref 6–23)
BUN: 58 mg/dL — ABNORMAL HIGH (ref 6–23)
BUN: 63 mg/dL — ABNORMAL HIGH (ref 6–23)
BUN: 65 mg/dL — ABNORMAL HIGH (ref 6–23)
CO2: 28 mEq/L (ref 19–32)
CO2: 28 mEq/L (ref 19–32)
CO2: 29 mEq/L (ref 19–32)
CO2: 32 mEq/L (ref 19–32)
CO2: 31 mEq/L (ref 19–32)
CO2: 33 mEq/L — ABNORMAL HIGH (ref 19–32)
CO2: 35 mEq/L — ABNORMAL HIGH (ref 19–32)
CO2: 37 mEq/L — ABNORMAL HIGH (ref 19–32)
CO2: 37 mEq/L — ABNORMAL HIGH (ref 19–32)
Calcium: 8 mg/dL — ABNORMAL LOW (ref 8.4–10.5)
Calcium: 8.1 mg/dL — ABNORMAL LOW (ref 8.4–10.5)
Calcium: 8.1 mg/dL — ABNORMAL LOW (ref 8.4–10.5)
Calcium: 8.3 mg/dL — ABNORMAL LOW (ref 8.4–10.5)
Calcium: 8.6 mg/dL (ref 8.4–10.5)
Calcium: 8.3 mg/dL — ABNORMAL LOW (ref 8.4–10.5)
Calcium: 8.4 mg/dL (ref 8.4–10.5)
Calcium: 9 mg/dL (ref 8.4–10.5)
Chloride: 100 mEq/L (ref 96–112)
Chloride: 98 mEq/L (ref 96–112)
Chloride: 100 mEq/L (ref 96–112)
Chloride: 105 mEq/L (ref 96–112)
Chloride: 109 mEq/L (ref 96–112)
Chloride: 111 mEq/L (ref 96–112)
Chloride: 111 mEq/L (ref 96–112)
Chloride: 111 mEq/L (ref 96–112)
Creatinine, Ser: 1.01 mg/dL (ref 0.4–1.5)
Creatinine, Ser: 1.03 mg/dL (ref 0.4–1.5)
Creatinine, Ser: 1.07 mg/dL (ref 0.4–1.5)
Creatinine, Ser: 1.26 mg/dL (ref 0.4–1.5)
Creatinine, Ser: 1.37 mg/dL (ref 0.4–1.5)
Creatinine, Ser: 1.44 mg/dL (ref 0.4–1.5)
Creatinine, Ser: 1.48 mg/dL (ref 0.4–1.5)
GFR calc Af Amer: >60 mL/min (ref >60)
GFR calc Af Amer: >60 mL/min (ref >60)
GFR calc Af Amer: >60 mL/min (ref >60)
GFR calc Af Amer: >60 mL/min (ref >60)
GFR calc Af Amer: >60 mL/min (ref >60)
GFR calc Af Amer: 52 mL/min — ABNORMAL LOW (ref >60)
GFR calc Af Amer: 56 mL/min — ABNORMAL LOW (ref >60)
GFR calc Af Amer: >60 mL/min (ref >60)
GFR calc Af Amer: >60 mL/min (ref >60)
GFR calc non Af Amer: 59 mL/min — ABNORMAL LOW (ref >60)
GFR calc non Af Amer: >60 mL/min (ref >60)
GFR calc non Af Amer: >60 mL/min (ref >60)
GFR calc non Af Amer: 43 mL/min — ABNORMAL LOW (ref >60)
GFR calc non Af Amer: 50 mL/min — ABNORMAL LOW (ref >60)
GFR calc non Af Amer: 51 mL/min — ABNORMAL LOW (ref >60)
GFR calc non Af Amer: 53 mL/min — ABNORMAL LOW (ref >60)
Glucose, Bld: 130 mg/dL — ABNORMAL HIGH (ref 70–99)
Glucose, Bld: 145 mg/dL — ABNORMAL HIGH (ref 70–99)
Glucose, Bld: 140 mg/dL — ABNORMAL HIGH (ref 70–99)
Glucose, Bld: 145 mg/dL — ABNORMAL HIGH (ref 70–99)
Glucose, Bld: 156 mg/dL — ABNORMAL HIGH (ref 70–99)
Glucose, Bld: 163 mg/dL — ABNORMAL HIGH (ref 70–99)
Glucose, Bld: 164 mg/dL — ABNORMAL HIGH (ref 70–99)
Glucose, Bld: 172 mg/dL — ABNORMAL HIGH (ref 70–99)
Potassium: 4.1 mEq/L (ref 3.5–5.1)
Potassium: 4.4 mEq/L (ref 3.5–5.1)
Potassium: 3.7 mEq/L (ref 3.5–5.1)
Potassium: 3.7 mEq/L (ref 3.5–5.1)
Potassium: 3.8 mEq/L (ref 3.5–5.1)
Potassium: 3.9 mEq/L (ref 3.5–5.1)
Potassium: 4 mEq/L (ref 3.5–5.1)
Sodium: 131 mEq/L — ABNORMAL LOW (ref 135–145)
Sodium: 134 mEq/L — ABNORMAL LOW (ref 135–145)
Sodium: 139 mEq/L (ref 135–145)
Sodium: 144 mEq/L (ref 135–145)
Sodium: 145 mEq/L (ref 135–145)
Sodium: 147 mEq/L — ABNORMAL HIGH (ref 135–145)
Sodium: 147 mEq/L — ABNORMAL HIGH (ref 135–145)
Sodium: 150 mEq/L — ABNORMAL HIGH (ref 135–145)

## 2010-10-02 LAB — DIFFERENTIAL
Basophils Absolute: 0 10*3/uL (ref 0.0–0.1)
Eosinophils Absolute: 0.5 10*3/uL (ref 0.0–0.7)
Eosinophils Relative: 3 % (ref 0–5)
Lymphocytes Relative: 24 % (ref 12–46)
Lymphs Abs: 3.5 10*3/uL (ref 0.7–4.0)
Neutrophils Relative %: 63 % (ref 43–77)

## 2010-10-02 LAB — BRAIN NATRIURETIC PEPTIDE
Pro B Natriuretic peptide (BNP): 1294 pg/mL — ABNORMAL HIGH (ref 0.0–100.0)
Pro B Natriuretic peptide (BNP): 1265 pg/mL — ABNORMAL HIGH (ref 0.0–100.0)

## 2010-10-02 LAB — URINE CULTURE
Colony Count: 80000
Colony Count: NO GROWTH
Culture: NO GROWTH
Special Requests: POSITIVE

## 2010-10-02 LAB — WOUND CULTURE

## 2010-10-02 LAB — COMPREHENSIVE METABOLIC PANEL
ALT: 66 U/L — ABNORMAL HIGH (ref 0–53)
ALT: 71 U/L — ABNORMAL HIGH (ref 0–53)
AST: 49 U/L — ABNORMAL HIGH (ref 0–37)
AST: 56 U/L — ABNORMAL HIGH (ref 0–37)
Albumin: 2.4 g/dL — ABNORMAL LOW (ref 3.5–5.2)
CO2: 30 mEq/L (ref 19–32)
CO2: 34 mEq/L — ABNORMAL HIGH (ref 19–32)
Calcium: 7.9 mg/dL — ABNORMAL LOW (ref 8.4–10.5)
Calcium: 8.5 mg/dL (ref 8.4–10.5)
Creatinine, Ser: 1.28 mg/dL (ref 0.4–1.5)
Creatinine, Ser: 1.38 mg/dL (ref 0.4–1.5)
GFR calc Af Amer: >60 mL/min (ref >60)
GFR calc Af Amer: >60 mL/min (ref >60)
GFR calc non Af Amer: 51 mL/min — ABNORMAL LOW (ref >60)
GFR calc non Af Amer: 55 mL/min — ABNORMAL LOW (ref >60)
Sodium: 145 mEq/L (ref 135–145)
Sodium: 148 mEq/L — ABNORMAL HIGH (ref 135–145)
Total Protein: 6.4 g/dL (ref 6.0–8.3)

## 2010-10-03 LAB — POCT I-STAT 3, ART BLOOD GAS (G3+)
Acid-Base Excess: 2 mmol/L (ref 0.0–2.0)
Acid-Base Excess: 3 mmol/L — ABNORMAL HIGH (ref 0.0–2.0)
Acid-Base Excess: 6 mmol/L — ABNORMAL HIGH (ref 0.0–2.0)
Acid-base deficit: 1 mmol/L (ref 0.0–2.0)
Acid-base deficit: 4 mmol/L — ABNORMAL HIGH (ref 0.0–2.0)
Acid-base deficit: 4 mmol/L — ABNORMAL HIGH (ref 0.0–2.0)
Acid-base deficit: 5 mmol/L — ABNORMAL HIGH (ref 0.0–2.0)
Acid-base deficit: 5 mmol/L — ABNORMAL HIGH (ref 0.0–2.0)
Acid-base deficit: 8 mmol/L — ABNORMAL HIGH (ref 0.0–2.0)
Bicarbonate: 26.2 mEq/L — ABNORMAL HIGH (ref 20.0–24.0)
Bicarbonate: 18.6 mEq/L — ABNORMAL LOW (ref 20.0–24.0)
Bicarbonate: 20.7 mEq/L (ref 20.0–24.0)
Bicarbonate: 20.8 mEq/L (ref 20.0–24.0)
Bicarbonate: 21.7 mEq/L (ref 20.0–24.0)
Bicarbonate: 25.2 mEq/L — ABNORMAL HIGH (ref 20.0–24.0)
Bicarbonate: 25.9 mEq/L — ABNORMAL HIGH (ref 20.0–24.0)
Bicarbonate: 26.6 mEq/L — ABNORMAL HIGH (ref 20.0–24.0)
Bicarbonate: 28.2 mEq/L — ABNORMAL HIGH (ref 20.0–24.0)
O2 Saturation: 97 %
O2 Saturation: 90 %
O2 Saturation: 92 %
O2 Saturation: 92 %
O2 Saturation: 94 %
O2 Saturation: 96 %
O2 Saturation: 98 %
O2 Saturation: 99 %
O2 Saturation: 99 %
O2 Saturation: 99 %
Patient temperature: 99
Patient temperature: 35
Patient temperature: 35.4
Patient temperature: 36.7
Patient temperature: 37.1
Patient temperature: 37.3
Patient temperature: 37.3
Patient temperature: 37.7
Patient temperature: 98.2
TCO2: 27 mmol/L (ref 0–100)
TCO2: 19 mmol/L (ref 0–100)
TCO2: 20 mmol/L (ref 0–100)
TCO2: 22 mmol/L (ref 0–100)
TCO2: 22 mmol/L (ref 0–100)
TCO2: 23 mmol/L (ref 0–100)
TCO2: 23 mmol/L (ref 0–100)
TCO2: 23 mmol/L (ref 0–100)
TCO2: 26 mmol/L (ref 0–100)
TCO2: 26 mmol/L (ref 0–100)
TCO2: 27 mmol/L (ref 0–100)
TCO2: 28 mmol/L (ref 0–100)
pCO2 arterial: 32.6 mmHg — ABNORMAL LOW (ref 35.0–45.0)
pCO2 arterial: 33.2 mmHg — ABNORMAL LOW (ref 35.0–45.0)
pCO2 arterial: 33.8 mmHg — ABNORMAL LOW (ref 35.0–45.0)
pCO2 arterial: 38.7 mmHg (ref 35.0–45.0)
pCO2 arterial: 43.7 mmHg (ref 35.0–45.0)
pCO2 arterial: 46 mmHg — ABNORMAL HIGH (ref 35.0–45.0)
pCO2 arterial: 47.2 mmHg — ABNORMAL HIGH (ref 35.0–45.0)
pH, Arterial: 7.476 — ABNORMAL HIGH (ref 7.350–7.450)
pH, Arterial: 7.237 — ABNORMAL LOW (ref 7.350–7.450)
pH, Arterial: 7.258 — ABNORMAL LOW (ref 7.350–7.450)
pH, Arterial: 7.259 — ABNORMAL LOW (ref 7.350–7.450)
pH, Arterial: 7.287 — ABNORMAL LOW (ref 7.350–7.450)
pH, Arterial: 7.288 — ABNORMAL LOW (ref 7.350–7.450)
pH, Arterial: 7.293 — ABNORMAL LOW (ref 7.350–7.450)
pH, Arterial: 7.303 — ABNORMAL LOW (ref 7.350–7.450)
pH, Arterial: 7.337 — ABNORMAL LOW (ref 7.350–7.450)
pH, Arterial: 7.359 (ref 7.350–7.450)
pH, Arterial: 7.384 (ref 7.350–7.450)
pH, Arterial: 7.436 (ref 7.350–7.450)
pO2, Arterial: 89 mmHg (ref 80.0–100.0)
pO2, Arterial: 163 mmHg — ABNORMAL HIGH (ref 80.0–100.0)
pO2, Arterial: 188 mmHg — ABNORMAL HIGH (ref 80.0–100.0)
pO2, Arterial: 48 mmHg — ABNORMAL LOW (ref 80.0–100.0)
pO2, Arterial: 70 mmHg — ABNORMAL LOW (ref 80.0–100.0)
pO2, Arterial: 72 mmHg — ABNORMAL LOW (ref 80.0–100.0)
pO2, Arterial: 74 mmHg — ABNORMAL LOW (ref 80.0–100.0)

## 2010-10-03 LAB — BASIC METABOLIC PANEL
BUN: 38 mg/dL — ABNORMAL HIGH (ref 6–23)
BUN: 39 mg/dL — ABNORMAL HIGH (ref 6–23)
BUN: 39 mg/dL — ABNORMAL HIGH (ref 6–23)
BUN: 41 mg/dL — ABNORMAL HIGH (ref 6–23)
BUN: 45 mg/dL — ABNORMAL HIGH (ref 6–23)
BUN: 49 mg/dL — ABNORMAL HIGH (ref 6–23)
BUN: 58 mg/dL — ABNORMAL HIGH (ref 6–23)
BUN: 58 mg/dL — ABNORMAL HIGH (ref 6–23)
BUN: 60 mg/dL — ABNORMAL HIGH (ref 6–23)
BUN: 62 mg/dL — ABNORMAL HIGH (ref 6–23)
BUN: 64 mg/dL — ABNORMAL HIGH (ref 6–23)
BUN: 26 mg/dL — ABNORMAL HIGH (ref 6–23)
BUN: 27 mg/dL — ABNORMAL HIGH (ref 6–23)
BUN: 61 mg/dL — ABNORMAL HIGH (ref 6–23)
CO2: 26 mEq/L (ref 19–32)
CO2: 26 mEq/L (ref 19–32)
CO2: 30 mEq/L (ref 19–32)
CO2: 31 mEq/L (ref 19–32)
CO2: 32 mEq/L (ref 19–32)
CO2: 35 mEq/L — ABNORMAL HIGH (ref 19–32)
CO2: 35 mEq/L — ABNORMAL HIGH (ref 19–32)
CO2: 36 mEq/L — ABNORMAL HIGH (ref 19–32)
CO2: 21 mEq/L (ref 19–32)
CO2: 23 mEq/L (ref 19–32)
CO2: 27 mEq/L (ref 19–32)
Calcium: 7.2 mg/dL — ABNORMAL LOW (ref 8.4–10.5)
Calcium: 7.2 mg/dL — ABNORMAL LOW (ref 8.4–10.5)
Calcium: 7.4 mg/dL — ABNORMAL LOW (ref 8.4–10.5)
Calcium: 7.7 mg/dL — ABNORMAL LOW (ref 8.4–10.5)
Calcium: 7.8 mg/dL — ABNORMAL LOW (ref 8.4–10.5)
Calcium: 7.9 mg/dL — ABNORMAL LOW (ref 8.4–10.5)
Calcium: 8 mg/dL — ABNORMAL LOW (ref 8.4–10.5)
Calcium: 8.1 mg/dL — ABNORMAL LOW (ref 8.4–10.5)
Calcium: 8.6 mg/dL (ref 8.4–10.5)
Calcium: 8.7 mg/dL (ref 8.4–10.5)
Calcium: 8.7 mg/dL (ref 8.4–10.5)
Calcium: 9.2 mg/dL (ref 8.4–10.5)
Chloride: 100 mEq/L (ref 96–112)
Chloride: 101 mEq/L (ref 96–112)
Chloride: 104 mEq/L (ref 96–112)
Chloride: 110 mEq/L (ref 96–112)
Chloride: 110 mEq/L (ref 96–112)
Chloride: 114 mEq/L — ABNORMAL HIGH (ref 96–112)
Chloride: 95 mEq/L — ABNORMAL LOW (ref 96–112)
Chloride: 98 mEq/L (ref 96–112)
Chloride: 102 mEq/L (ref 96–112)
Chloride: 104 mEq/L (ref 96–112)
Chloride: 107 mEq/L (ref 96–112)
Creatinine, Ser: 1.25 mg/dL (ref 0.4–1.5)
Creatinine, Ser: 1.32 mg/dL (ref 0.4–1.5)
Creatinine, Ser: 1.38 mg/dL (ref 0.4–1.5)
Creatinine, Ser: 1.48 mg/dL (ref 0.4–1.5)
Creatinine, Ser: 1.49 mg/dL (ref 0.4–1.5)
Creatinine, Ser: 1.57 mg/dL — ABNORMAL HIGH (ref 0.4–1.5)
Creatinine, Ser: 1.65 mg/dL — ABNORMAL HIGH (ref 0.4–1.5)
Creatinine, Ser: 1.84 mg/dL — ABNORMAL HIGH (ref 0.4–1.5)
Creatinine, Ser: 1.88 mg/dL — ABNORMAL HIGH (ref 0.4–1.5)
Creatinine, Ser: 1.89 mg/dL — ABNORMAL HIGH (ref 0.4–1.5)
Creatinine, Ser: 1.92 mg/dL — ABNORMAL HIGH (ref 0.4–1.5)
Creatinine, Ser: 1.23 mg/dL (ref 0.4–1.5)
Creatinine, Ser: 2.75 mg/dL — ABNORMAL HIGH (ref 0.4–1.5)
Creatinine, Ser: 2.79 mg/dL — ABNORMAL HIGH (ref 0.4–1.5)
GFR calc Af Amer: 42 mL/min — ABNORMAL LOW (ref >60)
GFR calc Af Amer: 43 mL/min — ABNORMAL LOW (ref >60)
GFR calc Af Amer: 49 mL/min — ABNORMAL LOW (ref >60)
GFR calc Af Amer: 53 mL/min — ABNORMAL LOW (ref >60)
GFR calc Af Amer: 56 mL/min — ABNORMAL LOW (ref >60)
GFR calc Af Amer: >60 mL/min (ref >60)
GFR calc Af Amer: >60 mL/min (ref >60)
GFR calc Af Amer: >60 mL/min (ref >60)
GFR calc Af Amer: >60 mL/min (ref >60)
GFR calc Af Amer: 24 mL/min — ABNORMAL LOW (ref >60)
GFR calc Af Amer: 27 mL/min — ABNORMAL LOW (ref >60)
GFR calc Af Amer: 28 mL/min — ABNORMAL LOW (ref >60)
GFR calc Af Amer: >60 mL/min (ref >60)
GFR calc non Af Amer: 35 mL/min — ABNORMAL LOW (ref >60)
GFR calc non Af Amer: 36 mL/min — ABNORMAL LOW (ref >60)
GFR calc non Af Amer: 40 mL/min — ABNORMAL LOW (ref >60)
GFR calc non Af Amer: 46 mL/min — ABNORMAL LOW (ref >60)
GFR calc non Af Amer: 51 mL/min — ABNORMAL LOW (ref >60)
GFR calc non Af Amer: 59 mL/min — ABNORMAL LOW (ref >60)
GFR calc non Af Amer: >60 mL/min (ref >60)
GFR calc non Af Amer: 19 mL/min — ABNORMAL LOW (ref >60)
GFR calc non Af Amer: 22 mL/min — ABNORMAL LOW (ref >60)
GFR calc non Af Amer: 23 mL/min — ABNORMAL LOW (ref >60)
Glucose, Bld: 116 mg/dL — ABNORMAL HIGH (ref 70–99)
Glucose, Bld: 121 mg/dL — ABNORMAL HIGH (ref 70–99)
Glucose, Bld: 134 mg/dL — ABNORMAL HIGH (ref 70–99)
Glucose, Bld: 141 mg/dL — ABNORMAL HIGH (ref 70–99)
Glucose, Bld: 156 mg/dL — ABNORMAL HIGH (ref 70–99)
Glucose, Bld: 165 mg/dL — ABNORMAL HIGH (ref 70–99)
Glucose, Bld: 169 mg/dL — ABNORMAL HIGH (ref 70–99)
Glucose, Bld: 180 mg/dL — ABNORMAL HIGH (ref 70–99)
Glucose, Bld: 211 mg/dL — ABNORMAL HIGH (ref 70–99)
Glucose, Bld: 371 mg/dL — ABNORMAL HIGH (ref 70–99)
Potassium: 3.2 mEq/L — ABNORMAL LOW (ref 3.5–5.1)
Potassium: 3.5 mEq/L (ref 3.5–5.1)
Potassium: 3.8 mEq/L (ref 3.5–5.1)
Potassium: 4 mEq/L (ref 3.5–5.1)
Potassium: 4 mEq/L (ref 3.5–5.1)
Potassium: 4 mEq/L (ref 3.5–5.1)
Potassium: 4.2 mEq/L (ref 3.5–5.1)
Potassium: 4.4 mEq/L (ref 3.5–5.1)
Potassium: 3 mEq/L — ABNORMAL LOW (ref 3.5–5.1)
Potassium: 3.3 mEq/L — ABNORMAL LOW (ref 3.5–5.1)
Potassium: 3.3 mEq/L — ABNORMAL LOW (ref 3.5–5.1)
Sodium: 138 mEq/L (ref 135–145)
Sodium: 141 mEq/L (ref 135–145)
Sodium: 143 mEq/L (ref 135–145)
Sodium: 144 mEq/L (ref 135–145)
Sodium: 144 mEq/L (ref 135–145)
Sodium: 146 mEq/L — ABNORMAL HIGH (ref 135–145)
Sodium: 142 mEq/L (ref 135–145)
Sodium: 143 mEq/L (ref 135–145)

## 2010-10-03 LAB — GLUCOSE, CAPILLARY
Glucose-Capillary: 101 mg/dL — ABNORMAL HIGH (ref 70–99)
Glucose-Capillary: 106 mg/dL — ABNORMAL HIGH (ref 70–99)
Glucose-Capillary: 106 mg/dL — ABNORMAL HIGH (ref 70–99)
Glucose-Capillary: 117 mg/dL — ABNORMAL HIGH (ref 70–99)
Glucose-Capillary: 118 mg/dL — ABNORMAL HIGH (ref 70–99)
Glucose-Capillary: 119 mg/dL — ABNORMAL HIGH (ref 70–99)
Glucose-Capillary: 120 mg/dL — ABNORMAL HIGH (ref 70–99)
Glucose-Capillary: 122 mg/dL — ABNORMAL HIGH (ref 70–99)
Glucose-Capillary: 123 mg/dL — ABNORMAL HIGH (ref 70–99)
Glucose-Capillary: 123 mg/dL — ABNORMAL HIGH (ref 70–99)
Glucose-Capillary: 130 mg/dL — ABNORMAL HIGH (ref 70–99)
Glucose-Capillary: 132 mg/dL — ABNORMAL HIGH (ref 70–99)
Glucose-Capillary: 136 mg/dL — ABNORMAL HIGH (ref 70–99)
Glucose-Capillary: 137 mg/dL — ABNORMAL HIGH (ref 70–99)
Glucose-Capillary: 138 mg/dL — ABNORMAL HIGH (ref 70–99)
Glucose-Capillary: 139 mg/dL — ABNORMAL HIGH (ref 70–99)
Glucose-Capillary: 140 mg/dL — ABNORMAL HIGH (ref 70–99)
Glucose-Capillary: 140 mg/dL — ABNORMAL HIGH (ref 70–99)
Glucose-Capillary: 142 mg/dL — ABNORMAL HIGH (ref 70–99)
Glucose-Capillary: 145 mg/dL — ABNORMAL HIGH (ref 70–99)
Glucose-Capillary: 147 mg/dL — ABNORMAL HIGH (ref 70–99)
Glucose-Capillary: 153 mg/dL — ABNORMAL HIGH (ref 70–99)
Glucose-Capillary: 153 mg/dL — ABNORMAL HIGH (ref 70–99)
Glucose-Capillary: 155 mg/dL — ABNORMAL HIGH (ref 70–99)
Glucose-Capillary: 157 mg/dL — ABNORMAL HIGH (ref 70–99)
Glucose-Capillary: 160 mg/dL — ABNORMAL HIGH (ref 70–99)
Glucose-Capillary: 162 mg/dL — ABNORMAL HIGH (ref 70–99)
Glucose-Capillary: 165 mg/dL — ABNORMAL HIGH (ref 70–99)
Glucose-Capillary: 166 mg/dL — ABNORMAL HIGH (ref 70–99)
Glucose-Capillary: 166 mg/dL — ABNORMAL HIGH (ref 70–99)
Glucose-Capillary: 166 mg/dL — ABNORMAL HIGH (ref 70–99)
Glucose-Capillary: 167 mg/dL — ABNORMAL HIGH (ref 70–99)
Glucose-Capillary: 173 mg/dL — ABNORMAL HIGH (ref 70–99)
Glucose-Capillary: 178 mg/dL — ABNORMAL HIGH (ref 70–99)
Glucose-Capillary: 107 mg/dL — ABNORMAL HIGH (ref 70–99)
Glucose-Capillary: 108 mg/dL — ABNORMAL HIGH (ref 70–99)
Glucose-Capillary: 110 mg/dL — ABNORMAL HIGH (ref 70–99)
Glucose-Capillary: 114 mg/dL — ABNORMAL HIGH (ref 70–99)
Glucose-Capillary: 115 mg/dL — ABNORMAL HIGH (ref 70–99)
Glucose-Capillary: 115 mg/dL — ABNORMAL HIGH (ref 70–99)
Glucose-Capillary: 117 mg/dL — ABNORMAL HIGH (ref 70–99)
Glucose-Capillary: 118 mg/dL — ABNORMAL HIGH (ref 70–99)
Glucose-Capillary: 122 mg/dL — ABNORMAL HIGH (ref 70–99)
Glucose-Capillary: 125 mg/dL — ABNORMAL HIGH (ref 70–99)
Glucose-Capillary: 126 mg/dL — ABNORMAL HIGH (ref 70–99)
Glucose-Capillary: 126 mg/dL — ABNORMAL HIGH (ref 70–99)
Glucose-Capillary: 130 mg/dL — ABNORMAL HIGH (ref 70–99)
Glucose-Capillary: 139 mg/dL — ABNORMAL HIGH (ref 70–99)
Glucose-Capillary: 140 mg/dL — ABNORMAL HIGH (ref 70–99)
Glucose-Capillary: 142 mg/dL — ABNORMAL HIGH (ref 70–99)
Glucose-Capillary: 143 mg/dL — ABNORMAL HIGH (ref 70–99)
Glucose-Capillary: 145 mg/dL — ABNORMAL HIGH (ref 70–99)
Glucose-Capillary: 155 mg/dL — ABNORMAL HIGH (ref 70–99)
Glucose-Capillary: 155 mg/dL — ABNORMAL HIGH (ref 70–99)
Glucose-Capillary: 157 mg/dL — ABNORMAL HIGH (ref 70–99)
Glucose-Capillary: 190 mg/dL — ABNORMAL HIGH (ref 70–99)
Glucose-Capillary: 232 mg/dL — ABNORMAL HIGH (ref 70–99)
Glucose-Capillary: 288 mg/dL — ABNORMAL HIGH (ref 70–99)
Glucose-Capillary: 301 mg/dL — ABNORMAL HIGH (ref 70–99)
Glucose-Capillary: 315 mg/dL — ABNORMAL HIGH (ref 70–99)
Glucose-Capillary: 316 mg/dL — ABNORMAL HIGH (ref 70–99)
Glucose-Capillary: 325 mg/dL — ABNORMAL HIGH (ref 70–99)
Glucose-Capillary: 336 mg/dL — ABNORMAL HIGH (ref 70–99)
Glucose-Capillary: 343 mg/dL — ABNORMAL HIGH (ref 70–99)
Glucose-Capillary: 99 mg/dL (ref 70–99)

## 2010-10-03 LAB — POCT I-STAT 4, (NA,K, GLUC, HGB,HCT)
Glucose, Bld: 161 mg/dL — ABNORMAL HIGH (ref 70–99)
Glucose, Bld: 176 mg/dL — ABNORMAL HIGH (ref 70–99)
Glucose, Bld: 203 mg/dL — ABNORMAL HIGH (ref 70–99)
HCT: 20 % — ABNORMAL LOW (ref 39.0–52.0)
HCT: 23 % — ABNORMAL LOW (ref 39.0–52.0)
HCT: 24 % — ABNORMAL LOW (ref 39.0–52.0)
HCT: 25 % — ABNORMAL LOW (ref 39.0–52.0)
HCT: 30 % — ABNORMAL LOW (ref 39.0–52.0)
HCT: 31 % — ABNORMAL LOW (ref 39.0–52.0)
HCT: 36 % — ABNORMAL LOW (ref 39.0–52.0)
HCT: 39 % (ref 39.0–52.0)
Hemoglobin: 10.5 g/dL — ABNORMAL LOW (ref 13.0–17.0)
Hemoglobin: 13.3 g/dL (ref 13.0–17.0)
Hemoglobin: 7.8 g/dL — CL (ref 13.0–17.0)
Hemoglobin: 8.2 g/dL — ABNORMAL LOW (ref 13.0–17.0)
Hemoglobin: 8.5 g/dL — ABNORMAL LOW (ref 13.0–17.0)
Hemoglobin: 8.5 g/dL — ABNORMAL LOW (ref 13.0–17.0)
Potassium: 4.2 mEq/L (ref 3.5–5.1)
Potassium: 4.3 mEq/L (ref 3.5–5.1)
Potassium: 4.9 mEq/L (ref 3.5–5.1)
Potassium: 5.1 mEq/L (ref 3.5–5.1)
Potassium: 5.9 mEq/L — ABNORMAL HIGH (ref 3.5–5.1)
Potassium: 6.7 mEq/L (ref 3.5–5.1)
Sodium: 134 mEq/L — ABNORMAL LOW (ref 135–145)
Sodium: 136 mEq/L (ref 135–145)
Sodium: 137 mEq/L (ref 135–145)
Sodium: 139 mEq/L (ref 135–145)
Sodium: 140 mEq/L (ref 135–145)
Sodium: 146 mEq/L — ABNORMAL HIGH (ref 135–145)

## 2010-10-03 LAB — COMPREHENSIVE METABOLIC PANEL
ALT: 1024 U/L — ABNORMAL HIGH (ref 0–53)
ALT: 2620 U/L — ABNORMAL HIGH (ref 0–53)
AST: 1623 U/L — ABNORMAL HIGH (ref 0–37)
AST: 3285 U/L — ABNORMAL HIGH (ref 0–37)
Albumin: 2.8 g/dL — ABNORMAL LOW (ref 3.5–5.2)
Alkaline Phosphatase: 42 U/L (ref 39–117)
Alkaline Phosphatase: 49 U/L (ref 39–117)
Alkaline Phosphatase: 53 U/L (ref 39–117)
Alkaline Phosphatase: 63 U/L (ref 39–117)
BUN: 63 mg/dL — ABNORMAL HIGH (ref 6–23)
CO2: 37 mEq/L — ABNORMAL HIGH (ref 19–32)
CO2: 26 mEq/L (ref 19–32)
CO2: 27 mEq/L (ref 19–32)
Calcium: 8.1 mg/dL — ABNORMAL LOW (ref 8.4–10.5)
Chloride: 100 mEq/L (ref 96–112)
Chloride: 104 mEq/L (ref 96–112)
Chloride: 106 mEq/L (ref 96–112)
Chloride: 99 mEq/L (ref 96–112)
Creatinine, Ser: 1.79 mg/dL — ABNORMAL HIGH (ref 0.4–1.5)
GFR calc Af Amer: 18 mL/min — ABNORMAL LOW (ref >60)
GFR calc Af Amer: 21 mL/min — ABNORMAL LOW (ref >60)
GFR calc non Af Amer: 37 mL/min — ABNORMAL LOW (ref >60)
GFR calc non Af Amer: 17 mL/min — ABNORMAL LOW (ref >60)
GFR calc non Af Amer: 27 mL/min — ABNORMAL LOW (ref >60)
Glucose, Bld: 174 mg/dL — ABNORMAL HIGH (ref 70–99)
Glucose, Bld: 106 mg/dL — ABNORMAL HIGH (ref 70–99)
Glucose, Bld: 117 mg/dL — ABNORMAL HIGH (ref 70–99)
Glucose, Bld: 144 mg/dL — ABNORMAL HIGH (ref 70–99)
Potassium: 3.3 mEq/L — ABNORMAL LOW (ref 3.5–5.1)
Potassium: 3.6 mEq/L (ref 3.5–5.1)
Potassium: 3.8 mEq/L (ref 3.5–5.1)
Potassium: 4.9 mEq/L (ref 3.5–5.1)
Sodium: 138 mEq/L (ref 135–145)
Sodium: 138 mEq/L (ref 135–145)
Sodium: 141 mEq/L (ref 135–145)
Total Bilirubin: 1.9 mg/dL — ABNORMAL HIGH (ref 0.3–1.2)
Total Bilirubin: 1.9 mg/dL — ABNORMAL HIGH (ref 0.3–1.2)
Total Bilirubin: 2.1 mg/dL — ABNORMAL HIGH (ref 0.3–1.2)
Total Bilirubin: 2.3 mg/dL — ABNORMAL HIGH (ref 0.3–1.2)
Total Protein: 6.3 g/dL (ref 6.0–8.3)
Total Protein: 4.5 g/dL — ABNORMAL LOW (ref 6.0–8.3)
Total Protein: 4.6 g/dL — ABNORMAL LOW (ref 6.0–8.3)

## 2010-10-03 LAB — PREPARE FRESH FROZEN PLASMA

## 2010-10-03 LAB — CBC
HCT: 24.2 % — ABNORMAL LOW (ref 39.0–52.0)
HCT: 26.6 % — ABNORMAL LOW (ref 39.0–52.0)
HCT: 28.1 % — ABNORMAL LOW (ref 39.0–52.0)
HCT: 29.1 % — ABNORMAL LOW (ref 39.0–52.0)
HCT: 29.3 % — ABNORMAL LOW (ref 39.0–52.0)
HCT: 29.4 % — ABNORMAL LOW (ref 39.0–52.0)
HCT: 30.5 % — ABNORMAL LOW (ref 39.0–52.0)
HCT: 24.4 % — ABNORMAL LOW (ref 39.0–52.0)
HCT: 26.7 % — ABNORMAL LOW (ref 39.0–52.0)
HCT: 29 % — ABNORMAL LOW (ref 39.0–52.0)
HCT: 29.1 % — ABNORMAL LOW (ref 39.0–52.0)
HCT: 29.6 % — ABNORMAL LOW (ref 39.0–52.0)
HCT: 40.9 % (ref 39.0–52.0)
Hemoglobin: 10.4 g/dL — ABNORMAL LOW (ref 13.0–17.0)
Hemoglobin: 10.4 g/dL — ABNORMAL LOW (ref 13.0–17.0)
Hemoglobin: 8.5 g/dL — ABNORMAL LOW (ref 13.0–17.0)
Hemoglobin: 9 g/dL — ABNORMAL LOW (ref 13.0–17.0)
Hemoglobin: 9 g/dL — ABNORMAL LOW (ref 13.0–17.0)
Hemoglobin: 9.6 g/dL — ABNORMAL LOW (ref 13.0–17.0)
Hemoglobin: 9.6 g/dL — ABNORMAL LOW (ref 13.0–17.0)
Hemoglobin: 10.2 g/dL — ABNORMAL LOW (ref 13.0–17.0)
Hemoglobin: 10.2 g/dL — ABNORMAL LOW (ref 13.0–17.0)
Hemoglobin: 14.4 g/dL (ref 13.0–17.0)
Hemoglobin: 8.6 g/dL — ABNORMAL LOW (ref 13.0–17.0)
Hemoglobin: 9.4 g/dL — ABNORMAL LOW (ref 13.0–17.0)
Hemoglobin: 9.9 g/dL — ABNORMAL LOW (ref 13.0–17.0)
MCHC: 33.7 g/dL (ref 30.0–36.0)
MCHC: 34 g/dL (ref 30.0–36.0)
MCHC: 34.1 g/dL (ref 30.0–36.0)
MCHC: 34.3 g/dL (ref 30.0–36.0)
MCHC: 34.9 g/dL (ref 30.0–36.0)
MCHC: 35.2 g/dL (ref 30.0–36.0)
MCHC: 35.3 g/dL (ref 30.0–36.0)
MCHC: 35.3 g/dL (ref 30.0–36.0)
MCHC: 35.8 g/dL (ref 30.0–36.0)
MCHC: 36.5 g/dL — ABNORMAL HIGH (ref 30.0–36.0)
MCV: 91.4 fL (ref 78.0–100.0)
MCV: 91.6 fL (ref 78.0–100.0)
MCV: 91.7 fL (ref 78.0–100.0)
MCV: 91.8 fL (ref 78.0–100.0)
MCV: 91.8 fL (ref 78.0–100.0)
MCV: 92.4 fL (ref 78.0–100.0)
MCV: 94.1 fL (ref 78.0–100.0)
MCV: 94.9 fL (ref 78.0–100.0)
MCV: 95.1 fL (ref 78.0–100.0)
MCV: 95 fL (ref 78.0–100.0)
MCV: 95.5 fL (ref 78.0–100.0)
MCV: 98.4 fL (ref 78.0–100.0)
Platelets: 124 10*3/uL — ABNORMAL LOW (ref 150–400)
Platelets: 177 10*3/uL (ref 150–400)
Platelets: 199 10*3/uL (ref 150–400)
Platelets: 232 10*3/uL (ref 150–400)
Platelets: 287 10*3/uL (ref 150–400)
Platelets: 341 10*3/uL (ref 150–400)
Platelets: 342 10*3/uL (ref 150–400)
Platelets: 371 10*3/uL (ref 150–400)
Platelets: 102 10*3/uL — ABNORMAL LOW (ref 150–400)
Platelets: 55 10*3/uL — ABNORMAL LOW (ref 150–400)
Platelets: 61 10*3/uL — ABNORMAL LOW (ref 150–400)
Platelets: 83 10*3/uL — ABNORMAL LOW (ref 150–400)
Platelets: 85 10*3/uL — ABNORMAL LOW (ref 150–400)
Platelets: 90 10*3/uL — ABNORMAL LOW (ref 150–400)
RBC: 2.57 MIL/uL — ABNORMAL LOW (ref 4.22–5.81)
RBC: 2.85 MIL/uL — ABNORMAL LOW (ref 4.22–5.81)
RBC: 2.91 MIL/uL — ABNORMAL LOW (ref 4.22–5.81)
RBC: 2.91 MIL/uL — ABNORMAL LOW (ref 4.22–5.81)
RBC: 3.01 MIL/uL — ABNORMAL LOW (ref 4.22–5.81)
RBC: 3.09 MIL/uL — ABNORMAL LOW (ref 4.22–5.81)
RBC: 3.34 MIL/uL — ABNORMAL LOW (ref 4.22–5.81)
RBC: 3.39 MIL/uL — ABNORMAL LOW (ref 4.22–5.81)
RBC: 2.08 MIL/uL — ABNORMAL LOW (ref 4.22–5.81)
RBC: 2.56 MIL/uL — ABNORMAL LOW (ref 4.22–5.81)
RBC: 2.62 MIL/uL — ABNORMAL LOW (ref 4.22–5.81)
RBC: 3.01 MIL/uL — ABNORMAL LOW (ref 4.22–5.81)
RBC: 3.16 MIL/uL — ABNORMAL LOW (ref 4.22–5.81)
RBC: 3.21 MIL/uL — ABNORMAL LOW (ref 4.22–5.81)
RBC: 4.15 MIL/uL — ABNORMAL LOW (ref 4.22–5.81)
RDW: 16.4 % — ABNORMAL HIGH (ref 11.5–15.5)
RDW: 16.6 % — ABNORMAL HIGH (ref 11.5–15.5)
RDW: 17 % — ABNORMAL HIGH (ref 11.5–15.5)
RDW: 17.1 % — ABNORMAL HIGH (ref 11.5–15.5)
RDW: 17.3 % — ABNORMAL HIGH (ref 11.5–15.5)
RDW: 17.3 % — ABNORMAL HIGH (ref 11.5–15.5)
RDW: 17.8 % — ABNORMAL HIGH (ref 11.5–15.5)
RDW: 17.8 % — ABNORMAL HIGH (ref 11.5–15.5)
RDW: 13.1 % (ref 11.5–15.5)
RDW: 13.6 % (ref 11.5–15.5)
RDW: 15.7 % — ABNORMAL HIGH (ref 11.5–15.5)
RDW: 16.3 % — ABNORMAL HIGH (ref 11.5–15.5)
RDW: 16.4 % — ABNORMAL HIGH (ref 11.5–15.5)
WBC: 10.4 10*3/uL (ref 4.0–10.5)
WBC: 13 10*3/uL — ABNORMAL HIGH (ref 4.0–10.5)
WBC: 14.6 10*3/uL — ABNORMAL HIGH (ref 4.0–10.5)
WBC: 16 10*3/uL — ABNORMAL HIGH (ref 4.0–10.5)
WBC: 16.9 10*3/uL — ABNORMAL HIGH (ref 4.0–10.5)
WBC: 17.8 10*3/uL — ABNORMAL HIGH (ref 4.0–10.5)
WBC: 18.9 10*3/uL — ABNORMAL HIGH (ref 4.0–10.5)
WBC: 19.5 10*3/uL — ABNORMAL HIGH (ref 4.0–10.5)
WBC: 9.4 10*3/uL (ref 4.0–10.5)
WBC: 14.6 10*3/uL — ABNORMAL HIGH (ref 4.0–10.5)
WBC: 15.2 10*3/uL — ABNORMAL HIGH (ref 4.0–10.5)
WBC: 16.5 10*3/uL — ABNORMAL HIGH (ref 4.0–10.5)
WBC: 17.2 10*3/uL — ABNORMAL HIGH (ref 4.0–10.5)
WBC: 17.5 10*3/uL — ABNORMAL HIGH (ref 4.0–10.5)
WBC: 19.3 10*3/uL — ABNORMAL HIGH (ref 4.0–10.5)
WBC: 21.2 10*3/uL — ABNORMAL HIGH (ref 4.0–10.5)

## 2010-10-03 LAB — URINALYSIS, ROUTINE W REFLEX MICROSCOPIC
Bilirubin Urine: NEGATIVE
Glucose, UA: NEGATIVE mg/dL
Hgb urine dipstick: NEGATIVE
Specific Gravity, Urine: 1.012 (ref 1.005–1.030)
Urobilinogen, UA: 1 mg/dL (ref 0.0–1.0)

## 2010-10-03 LAB — PROTIME-INR
INR: 1.1 (ref 0.00–1.49)
INR: 1.8 — ABNORMAL HIGH (ref 0.00–1.49)
INR: 1.8 — ABNORMAL HIGH (ref 0.00–1.49)
INR: 3.5 — ABNORMAL HIGH (ref 0.00–1.49)
Prothrombin Time: 19.3 seconds — ABNORMAL HIGH (ref 11.6–15.2)
Prothrombin Time: 21.1 seconds — ABNORMAL HIGH (ref 11.6–15.2)
Prothrombin Time: 22.1 seconds — ABNORMAL HIGH (ref 11.6–15.2)
Prothrombin Time: 22.3 seconds — ABNORMAL HIGH (ref 11.6–15.2)
Prothrombin Time: 37.6 seconds — ABNORMAL HIGH (ref 11.6–15.2)

## 2010-10-03 LAB — PREPARE RBC (CROSSMATCH)

## 2010-10-03 LAB — POCT I-STAT, CHEM 8
BUN: 30 mg/dL — ABNORMAL HIGH (ref 6–23)
BUN: 61 mg/dL — ABNORMAL HIGH (ref 6–23)
BUN: 64 mg/dL — ABNORMAL HIGH (ref 6–23)
Calcium, Ion: 1.1 mmol/L — ABNORMAL LOW (ref 1.12–1.32)
Calcium, Ion: 1.1 mmol/L — ABNORMAL LOW (ref 1.12–1.32)
Calcium, Ion: 1.19 mmol/L (ref 1.12–1.32)
Chloride: 102 mEq/L (ref 96–112)
Chloride: 112 mEq/L (ref 96–112)
Creatinine, Ser: 2 mg/dL — ABNORMAL HIGH (ref 0.4–1.5)
Creatinine, Ser: 2.2 mg/dL — ABNORMAL HIGH (ref 0.4–1.5)
Glucose, Bld: 108 mg/dL — ABNORMAL HIGH (ref 70–99)
Glucose, Bld: 120 mg/dL — ABNORMAL HIGH (ref 70–99)
Glucose, Bld: 211 mg/dL — ABNORMAL HIGH (ref 70–99)
HCT: 24 % — ABNORMAL LOW (ref 39.0–52.0)
HCT: 43 % (ref 39.0–52.0)
Hemoglobin: 8.8 g/dL — ABNORMAL LOW (ref 13.0–17.0)
Potassium: 3.5 mEq/L (ref 3.5–5.1)
Potassium: 3.9 mEq/L (ref 3.5–5.1)
Sodium: 141 mEq/L (ref 135–145)
Sodium: 146 mEq/L — ABNORMAL HIGH (ref 135–145)
TCO2: 21 mmol/L (ref 0–100)
TCO2: 25 mmol/L (ref 0–100)
TCO2: 28 mmol/L (ref 0–100)

## 2010-10-03 LAB — DIFFERENTIAL
Basophils Absolute: 0 10*3/uL (ref 0.0–0.1)
Basophils Relative: 0 % (ref 0–1)
Basophils Relative: 0 % (ref 0–1)
Eosinophils Absolute: 0.6 10*3/uL (ref 0.0–0.7)
Eosinophils Absolute: 0.1 10*3/uL (ref 0.0–0.7)
Eosinophils Relative: 3 % (ref 0–5)
Eosinophils Relative: 0 % (ref 0–5)
Lymphocytes Relative: 5 % — ABNORMAL LOW (ref 12–46)
Lymphs Abs: 0.9 10*3/uL (ref 0.7–4.0)
Lymphs Abs: 2.9 10*3/uL (ref 0.7–4.0)
Monocytes Absolute: 1.4 10*3/uL — ABNORMAL HIGH (ref 0.1–1.0)
Monocytes Absolute: 1.3 10*3/uL — ABNORMAL HIGH (ref 0.1–1.0)
Monocytes Relative: 8 % (ref 3–12)
Monocytes Relative: 7 % (ref 3–12)
Neutro Abs: 14.8 10*3/uL — ABNORMAL HIGH (ref 1.7–7.7)
Neutrophils Relative %: 84 % — ABNORMAL HIGH (ref 43–77)

## 2010-10-03 LAB — CROSSMATCH

## 2010-10-03 LAB — CULTURE, RESPIRATORY W GRAM STAIN

## 2010-10-03 LAB — POCT I-STAT 3, VENOUS BLOOD GAS (G3P V)
Bicarbonate: 26.5 mEq/L — ABNORMAL HIGH (ref 20.0–24.0)
TCO2: 28 mmol/L (ref 0–100)

## 2010-10-03 LAB — CULTURE, BLOOD (ROUTINE X 2)

## 2010-10-03 LAB — CREATININE, SERUM
Creatinine, Ser: 2.29 mg/dL — ABNORMAL HIGH (ref 0.4–1.5)
GFR calc Af Amer: 34 mL/min — ABNORMAL LOW (ref >60)
GFR calc non Af Amer: 17 mL/min — ABNORMAL LOW (ref >60)

## 2010-10-03 LAB — TYPE AND SCREEN
ABO/RH(D): O POS
ABO/RH(D): O POS
Antibody Screen: NEGATIVE
Antibody Screen: NEGATIVE

## 2010-10-03 LAB — BRAIN NATRIURETIC PEPTIDE
Pro B Natriuretic peptide (BNP): 1255 pg/mL — ABNORMAL HIGH (ref 0.0–100.0)
Pro B Natriuretic peptide (BNP): 1270 pg/mL — ABNORMAL HIGH (ref 0.0–100.0)
Pro B Natriuretic peptide (BNP): 1903 pg/mL — ABNORMAL HIGH (ref 0.0–100.0)
Pro B Natriuretic peptide (BNP): 2136 pg/mL — ABNORMAL HIGH (ref 0.0–100.0)

## 2010-10-03 LAB — CARDIAC PANEL(CRET KIN+CKTOT+MB+TROPI)
CK, MB: 277.8 ng/mL — ABNORMAL HIGH (ref 0.3–4.0)
Total CK: 193 U/L (ref 7–232)
Total CK: 2339 U/L — ABNORMAL HIGH (ref 7–232)
Total CK: 3019 U/L — ABNORMAL HIGH (ref 7–232)
Troponin I: >100 ng/mL (ref 0.00–0.06)

## 2010-10-03 LAB — MAGNESIUM
Magnesium: 2.7 mg/dL — ABNORMAL HIGH (ref 1.5–2.5)
Magnesium: 2.9 mg/dL — ABNORMAL HIGH (ref 1.5–2.5)
Magnesium: 2.2 mg/dL (ref 1.5–2.5)
Magnesium: 2.6 mg/dL — ABNORMAL HIGH (ref 1.5–2.5)

## 2010-10-03 LAB — HEPATIC FUNCTION PANEL
ALT: 131 U/L — ABNORMAL HIGH (ref 0–53)
AST: 378 U/L — ABNORMAL HIGH (ref 0–37)
Bilirubin, Direct: 1.4 mg/dL — ABNORMAL HIGH (ref 0.0–0.3)
Total Bilirubin: 2.7 mg/dL — ABNORMAL HIGH (ref 0.3–1.2)

## 2010-10-03 LAB — HEMOGLOBIN AND HEMATOCRIT, BLOOD
HCT: 22.9 % — ABNORMAL LOW (ref 39.0–52.0)
Hemoglobin: 8.1 g/dL — ABNORMAL LOW (ref 13.0–17.0)

## 2010-10-03 LAB — APTT
aPTT: 104 seconds — ABNORMAL HIGH (ref 24–37)
aPTT: 34 seconds (ref 24–37)

## 2010-10-03 LAB — URINE CULTURE
Colony Count: NO GROWTH
Colony Count: NO GROWTH
Colony Count: NO GROWTH
Culture: NO GROWTH
Culture: NO GROWTH
Special Requests: NEGATIVE

## 2010-10-03 LAB — PREPARE PLATELETS

## 2010-10-03 LAB — CALCIUM, IONIZED: Calcium, Ion: 1.13 mmol/L (ref 1.12–1.32)

## 2010-10-03 LAB — VANCOMYCIN, TROUGH: Vancomycin Tr: 13.5 ug/mL (ref 10.0–20.0)

## 2010-10-03 LAB — CORTISOL: Cortisol, Plasma: 16.2 ug/dL

## 2010-10-03 LAB — PHOSPHORUS: Phosphorus: 4.5 mg/dL (ref 2.3–4.6)

## 2010-10-03 LAB — HEPARIN ANTIBODY SCREEN: Heparin Antibody Screen: NEGATIVE

## 2010-10-03 LAB — DIC (DISSEMINATED INTRAVASCULAR COAGULATION)PANEL
D-Dimer, Quant: 0.59 ug/mL-FEU — ABNORMAL HIGH (ref 0.00–0.48)
Fibrinogen: 152 mg/dL — ABNORMAL LOW (ref 204–475)
Platelets: 102 10*3/uL — ABNORMAL LOW (ref 150–400)
Smear Review: NONE SEEN

## 2010-10-03 LAB — POCT CARDIAC MARKERS
CKMB, poc: 2.4 ng/mL (ref 1.0–8.0)
Troponin i, poc: <0.05 ng/mL (ref 0.00–0.09)

## 2010-10-03 LAB — HEPARIN INDUCED THROMBOCYTOPENIA PNL: Patient O.D.: 0.152

## 2010-10-22 ENCOUNTER — Other Ambulatory Visit: Payer: Self-pay | Admitting: Internal Medicine

## 2010-11-05 NOTE — Cardiovascular Report (Signed)
NAMEHUBERT, RAATZ               ACCOUNT NO.:  [id number]   MEDICAL RECORD NO.:  [id number]          PATIENT TYPE:  INP   LOCATION:  1826                         FACILITY:  [facility]   PHYSICIAN:  Arturo Morton. Riley Kill, MD, FACCDATE OF BIRTH:  [birth year]   DATE OF PROCEDURE:  DATE OF DISCHARGE:                            CARDIAC CATHETERIZATION   INDICATIONS:  This nice gentleman is a 77 year old with a history of  hypertension and hyperlipidemia.  He also has hypothyroidism.  He was  having shoulder discomfort while hitting golf balls tonight.  The pain  became more intense and his daughter finally convinced him to come to  the emergency room.  On arrival in the emergency room, he had marked ST  elevation in the inferior leads.  Shortly thereafter, he proceeded to  fibrillate.  He was resuscitated and then fibrillated again and was  resuscitated a second time.  The cath lab had already been called in for  another procedure, and so they were already en route.  Preparations were  made for urgent intervention.  He was brought to the catheterization  laboratory promptly for possible reperfusion therapy.   PROCEDURES:  1. Left heart catheterization.  2. Selective coronary arteriography.  3. Selective left ventriculography.  4. Attempt to cross the occluded artery with a multiple guide wires.  5. Insertion of temporary transvenous pacer.  6. Insertion of intraaortic balloon pump.   DESCRIPTION OF PROCEDURE:  The patient was brought to the cath lab.  On  arrival to cath lab, he also had ventricular fibrillation.  Subsequently, he was resuscitated.  He developed a moderate amount of  bradycardia.  We originally were preparing for possible catheterization.  Views of the left coronary artery were then obtained.  Views of the  right coronary artery were obtained using a guiding catheter.  It was  our assessment that the RCA appeared to have fresh occlusion.  In the  emergency room, the patient  had already received oral clopidogrel as  well as aspirin, and unfractionated heparin.  It had been more than 30  minutes, and subsequently, we elected to give bivalirudin according to  protocol.  Following this, multiple wires were utilized to try to cross  the occluded artery.  We continued to try to cross, including taking  over the wire balloons up to the lesions and trying to see if we could  get through to the other side.  Multiple wires including a Prowater,  BMW, traverse Luge, and PT moderate were attempted in an attempt to get  across the lesion.  All of these wire were subsequently failed.  We also  performed ventriculography and other views of the circumflex to ensure  that this was not the infarct artery.  In addition, I asked Dr. Juanda Chance  to come to the laboratory, and he came over to the laboratory and  reviewed the films as to Dr. Antoine Poche.  It was our feeling that the RCA  was likely the source.  Additional attempts were tried to get across the  right and concomitantly with this, we elected to call the cardiovascular  surgeons as the patient had severe multivessel disease involving the  other arteries as well.  Dr. Dorris Fetch came properly through the  laboratory and reviewed the films with this.  Of note, a temporary  transvenous pacer had been put in earlier after initial episodes of  ventricular fibrillation with successful cardioversion.  Temporary wire  was inserted, tested, and used for pacing.  In addition, the patient  received full two boluses of amiodarone as well as an amiodarone drip to  try to prevent recurrent VF.  After review of the films, we were all in  agreement that urgent revascularization surgery would be warranted given  the extent of the multivessel disease.  The patient was agreeable to  this approach.  Intra-aortic balloon pump was then placed and sewn into  place, a temporary wire was secured.  The patient was taken to the  operating room alert,  oriented.  He was then placed in the operating  room for further treatment.   HEMODYNAMIC DATA:  1. Central aortic pressure was 114/65, mean 85.  2. Left ventricular pressure was 103/21.  3. There was not a significant gradient or pullback across the aortic      valve.   ANGIOGRAPHIC DATA:  1. Ventriculography done in the RAO projection reveals a fairly large      area of inferior wall hypo to near akinesis.  Ejection fraction to      be estimated at 40-45%  2. The left main is diffusely irregular with about 40% distal left      main disease.  3. The LAD courses to the apex.  There are multiple lesions in tandem      including probably 70, 60, and 70.  Both diagonal branches have 90%      ostial stenoses, the second one being somewhat smaller in caliber.  4. The circumflex provides a ramus intermedius that has about 70%      proximal disease.  The AV circumflex is totally occluded and there      appeared to be multiple collateral vessels.  5. The right coronary artery appears totally occluded in the proximal      segment.  We were able to get a guidewire well out beyond the site      of total occlusion, but it would never navigate down the vessel.      This is despite attempts with an over the wire balloon at the      lesion.   CONCLUSIONS:  1. Unsuccessful attempt at acute reperfusion therapy due to inability      to cross the site of total occlusion despite multiple wires.  2. Acute inferior wall myocardial infarction.  3. 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   PLAN:  The patient was brought to the catheterization laboratory for  urgent reperfusion therapy.  He had multiple episodes of ventricular  fibrillation in the emergency room and in the cath lab.  He has severe  multivessel disease.  Attempts at opening the right, despite multiple  techniques were unsuccessful and it was felt that urgent  revascularization would be  optimal given the extent of his multivessel  disease.  This has been explained to the family and they are in  agreement.      Arturo Morton. Riley Kill, MD, [facility]  Electronically Signed     TDS/MEDQ  D:  08/29/2008  T:  08/29/2008  Job:  [id number]   cc:  Stacie Glaze, MD  [facility]

## 2010-11-05 NOTE — Discharge Summary (Signed)
Zachary Decker, Zachary Decker               ACCOUNT NO.:  [id number]   MEDICAL RECORD NO.:  [id number]          PATIENT TYPE:  INP   LOCATION:  2313                         FACILITY:  [facility]   PHYSICIAN:  Zachary Decker. Dorris Fetch, M.D.DATE OF BIRTH:  [birth year]   DATE OF ADMISSION:  08/28/2008  DATE OF DISCHARGE:  10/04/2008                               DISCHARGE SUMMARY   FINAL DIAGNOSIS:  Acute myocardial infarction with cardiogenic shock and  multiple ventricular fibrillation arrest.   IN-HOSPITAL DIAGNOSES:  1. Ventilator-dependent respiratory failure.  2. Acute renal insufficiency.  3. Acute pack insufficiency with shock liver.  4. Right pleural effusions with a right lung infiltrate.  5. Thrombocytopenia.  6. Volume overload.  7. Encephalopathy.  8. Acute blood loss anemia.  9. Candida albicans positive in sputum culture.  10.Hematuria.  11.Bacteremia with coag-negative staph.  12.Deconditioning.  13.Severe oropharyngeal and pharyngeal phase dysphagia.   SECONDARY DIAGNOSES:  1. Hypertension.  2. Hyperlipidemia.  3. Hypothyroidism.   IN-HOSPITAL OPERATIONS AND PROCEDURES:  1. Emergency salvage median sternotomy, extracorporal circulation,      coronary artery bypass grafting x5 using a left internal mammary      artery to left anterior descending, sequential saphenous vein graft      to ramus intermediate and obtuse marginal 1, sequential saphenous      vein graft to posterior descending and posterolateral.  Endoscopic      vein harvest of the right leg.  This was done on August 29, 2008.  2. Cardiac catheterization with left heart catheterization, selective      coronary arteriography, selective left ventriculography, attempt to      cross the occluded artery with multiple guidewires, insertion of      temporary transvenous pacer, and insertion of intra-aortic balloon      pump.  This was done on August 29, 2008.  3. Percutaneous tracheostomy placement using 8-French Shiley  tracheostomy on September 14, 2008.  4. Placement of a right chest tube, 28-French on September 04, 2008.   HISTORY AND PHYSICAL AND HOSPITAL COURSE:  [name] is a 77 year old  gentleman who presented with a 6-hour history of chest pain.  He was  hitting golf balls and developed severe intense left shoulder pain.  EKG  done in the emergency room was consistent with marked ST elevation in  the inferior leads with reciprocal change.  While in the emergency room,  the patient had two episodes of ventricular fibrillation requiring  cardioversion.  He was successfully resuscitated and was taken  emergently to the cardiac cath lab.  This was done by Dr. Riley Decker and  Dr. Antoine Decker.  In cath lab, he was found to have totally occluded left  circumflex and right coronary artery.  He also had severe disease in his  left anterior descending.  Dr. Riley Decker was unable to pass the wire  through the right coronary artery which was felt to be the culprit  vessel.  Dr. Dorris Fetch was consulted.  Dr. Dorris Fetch came and  evaluated the patient.  Discussed with family and took the patient  emergently to the operating room to  proceed with coronary artery bypass  grafting.  Risks and benefits were discussed.  All agreed to proceed.  While in the cardiac cath lab, an intra-aortic balloon pump was placed.  The patient continued to have severe ST elevation.  He was taken  emergently to the operating room.  For further details of the patient's  past medical history and physical exam, please see dictated H and P.   On August 29, 2008, the patient was taken emergently to the operating room  by Dr. Dorris Fetch where he underwent emergency salvage median  sternotomy with extracorporeal circulation, coronary artery bypass  grafting x5 using a left internal mammary artery to left anterior  descending, sequential saphenous vein graft to ramus intermedius and  obtuse marginal 1, sequential saphenous vein graft to posterior   descending and posterolateral.  Endoscopic vein harvest of the right leg  was done.  The patient was able to tolerate this procedure and  transferred to the Surgical Critical Care Unit in critical condition.  Intra-aortic balloon pump remained.   Pulmonary.  Postoperatively from a pulmonary standpoint, the patient  remained on the ventilator.  He was unable to be extubated and became  ventilator-dependent respiratory failure.  During this time, he  developed a right pleural effusion requiring placement of right chest  tube.  Right chest tube continued to drain significant amount of fluid.  Over time, this did decrease and right chest tube was discontinued on  September 22, 2008.  Also during this time, sputum cultures were obtained.  He was positive for Gram-positive rods and placed on appropriate  antibiotics.  The patient was also positive for Candida albicans and was  placed on Diflucan.  Antibiotics were continued for appropriate number  of days and discontinued.  During this time, he did have fevers and  leukocytosis and did improve after initiating IV antibiotics.  Critical  Care Management was consulted to assist in ventilator settings and  weaning protocols.  The patient continued to be unable to be weaned from  ventilator.  Dr. Dorris Fetch discussed with family placement of  tracheostomy tube.  This was done on September 14, 2008.  The patient was  taken to the operating room where he underwent percutaneous tracheostomy  placement using an 8-French Shiley tracheostomy tube.  He tolerated this  well and was transferred back to the Surgical Intensive Care Unit.  After placement of tracheostomy tube, he was again attempted to be  weaned.  The patient started tolerating trach collar for several hours a  day.  Eventually, he was able to be weaned off the ventilator with trach  collar.  Eventually, he was able to be plugged and weaned off oxygen.  He currently is saturating greater than 90% on  room air.  The patient is  currently with TMV in place.  Over the next several days, hope to start  changing of trachea heading towards decannulating .   Cardiac:  Postoperatively, the patient required multiple inotropic  drips.  Intra-aortic balloon pump was continued for several days  postoperatively.  He remained A-paced.  Slowly the drips were able to be  weaned.  Intra-aortic balloon pump was discontinued on August 23, 2008.  A  2-D echocardiogram was done on September 06, 2008.  This was a limited  procedure.  Had questionable moderate LV dysfunction.  Over time, all  drips were eventually weaned and discontinued.  The patient's blood  pressure tolerated this well.  External pacer was turned off and he  remained  in sinus rhythm.  Heart rate greater than 60s.  He was  eventually able to be started on low-dose beta blocker.  He tolerated  this well.  Currently, blood pressure was stable and he is in sinus  rhythm in the 60s.   ID.  As stated above, the patient did have positive Candida albicans as  well as moderate Gram-negative rods and sputum culture.  He received  appropriate IV antibiotics.  After these were treated, he developed  again leukocytosis and fever.  Blood cultures were sent and he was seen  to have positive bacteremia coag-negative staph.  He was restarted on  appropriate IV antibiotics.  He could continue on these for the  appropriate days and his leukocytosis and fever resolved.   GI.  During the patient's ventilator-dependent respiratory failure, a  Panda tube was placed.  He was receiving tube feedings.  These were  continued.  Nutrition was consulted first in the management.  Once  weaned from the ventilator, Speech Pathology was consulted.  They  performed several evaluations.  The patient continued to have severe  pharyngeal dysphagia.  Currently, he remains on tube feeding with ice  chips only.  Speech Pathology continues to assist in therapy and we will   reevaluate the swallow function at a later date.   The patient did have acute blood loss anemia as well as thrombocytopenia  postoperatively.  He did receive transfusions of packed red blood cells  as well as fresh frozen plasma.  His hemoglobin, hematocrit,and platelet  count were followed closely.  These did stabilize.  Last lab work showed  H and H of 11.6 and 34.0, and platelet count of 294.  The patient also  had significant volume overload postoperatively.  He was started on IV  diuretics.  Weight was monitored closely.  Volume overload was  improving.  Currently continuing IV Lasix.  The patient also developed  acute renal insufficiency.  Urine output was followed and stabilized.  He did have some hematuria during this time.  The Lovenox was held.  Hematuria did improve.  Creatinine stabilized.  Lovenox was able to be  restarted.  Currently, we will continue to follow.   Following the patient's course of ventilator-dependent respiratory  failure and placement of trach, he was severely deconditioned.  Physical  Therapy and Occupational Therapy were consulted.  Slowly, they were  working with the patient and he was progressing.  The patient felt that  he was possibly be a rehab candidate.  PM and R were consulted.  They  did see and evaluate the patient and did agree that he was a good  inpatient rehab candidate.  Currently, the patient is stable from  medical standpoint today, October 04, 2008.  Rehab saw and evaluated him  to stay and have a bed available.  Plan is for transfer to rehab today,  October 04, 2008.   On October 04, 2008, the patient was noted to be afebrile.  He was in  normal sinus rhythm.  Blood pressure stable.  He was saturating 96% on  room air.  Most recent lab work shows a white count of 14.8, hemoglobin  of 11.6, hematocrit 34.0, and platelet count 294.  Sodium of 145,  potassium 3.7, chloride of 100, bicarbonate 35, BUN of 58, creatinine  1.37, and glucose of  156.   FOLLOWUP APPOINTMENTS:  We will continue to follow the patient while he  is in inpatient rehab.  We will then schedule a followup  appointment  with him to see Dr. Dorris Fetch 3 weeks following discharge from rehab.  He will need to obtain PMI chest x-ray 30 minutes prior to this  appointment.  The patient will need to follow up with Dr. Riley Decker 2  weeks following discharge from rehab.   ACTIVITY:  The patient is to continue working with PT, OT on inpatient  rehab.  Once discharged from there, he is told no driving until released  to do so.  No heavy lifting over 10 pounds.  The patient is to continue  using his incentive spirometer as instructed.   INCISIONAL CARE:  Please wash the patient's incisions using soap and  water.  Contact us if he develops any drainage or opening from any of  his incision sites.   DIET:  The patient is to remained n.p.o. except ice chips.  Continue  tube feeding.   MEDICATIONS:  1. Lopressor 6.25 mg via tube b.i.d.  2. Xopenex 0.63 mg IH q.6 hours.  3. Seroquel 50 mg via tube at night.  4. Aspirin 325 mg via tube daily.  5. Dulcolax 10 mg p.o. daily.  6. Protonix 40 mg via tube daily.  7. Lasix 40 mg IV daily.  8. Jevity tube feedings via tube q.12 h. 60 mL/hour.  9. Lovenox 40 mg subcu at night.  10.Lantus insulin 14 units subcu at night.  11.Peridex oral rinse 0.12% 15 mL p.o. q.8 a.m. and q.8 p.m.  12.Proxamine 1.5 solution applied topically q.4 h.  13.Bacitracin zinc ointment applied topically to bilateral feet wound.  14.Synthroid 50 mcg via tube daily.  15.Crestor 20 mg via tube at night.  16.Tylenol 650 mg q.6 h. p.r.n.      Sol Blazing, PA      Zachary Decker. Dorris Fetch, M.D.  Electronically Signed    KMD/MEDQ  D:  10/04/2008  T:  10/05/2008  Job:  [id number]   cc:   Arturo Morton. Zachary Kill, MD, [facility]

## 2010-11-05 NOTE — Assessment & Plan Note (Signed)
OFFICE VISIT   Zachary Decker, Zachary Decker  DOB:  1932-11-12                                        Oct 30, 2008  CHART #:  [id number]   The patient is a 77 year old gentleman, who presented with an acute MI  complicated by multiple V-fib arrest.  He got shock a total of 8 or 9  times before he got to the operating room for emergent coronary artery  bypass grafting x5.  He had a long and complicated course.  He had  persistent hypotension requiring high-dose pressors.  He developed some  ischemic necrosis on his toes.  He had multiple infections treated with  multiple courses of antibiotics.  He had ventilator-dependent  respiratory failure requiring tracheostomy.  He had severe  deconditioning.  He finally went to rehab on October 04, 2008, and then  was finally discharged from there on Oct 24, 2008.  He has been home for  about a week now.  He has been doing well.  He has not had any chest  pain or shortness of breath.  His rehab is going well.  He says his toes  are getting better.  He has minimal discomfort.  His speech is getting  better.  He is still hoarse, but he has noticed some improvement.  He  says his swallowing is much better.  He has another swallowing test  later this week.   Current medications are Norvasc 5 mg daily, TriCor 160 mg daily,  Synthroid 50 mcg daily, aspirin 81 mg daily, multivitamin daily, and  Pepcid as needed.   PHYSICAL EXAMINATION:  GENERAL:  The patient is a 77 year old gentleman  in no acute distress.  VITAL SIGNS:  His blood pressure is 125/82, pulse 76, respirations are  16, and oxygen saturation is 95% on room air.  LUNGS:  Diminished breath sounds at the right base, otherwise clear.  CARDIAC:  Regular rate and rhythm.  Normal S1 and S2.  CHEST:  Sternum is stable.  Sternal incision is healed.  EXTREMITIES:  His leg incisions are healed.  He has no peripheral edema.  He does have some discoloration of his feet bilaterally.   There is some  necrotic areas on the tips of the toes of the right foot with some  eschar formation.  He has partially lost some nails, but his feet and  toes are viable.   Chest x-ray shows some persistent right pleural effusion and appears  probably loculated in the fissure.   IMPRESSION:  The patient is doing extremely well at this point in time.  He has really made a remarkable recovery considering when he was going  to the operating room under salvage conditions.  He is making really  good progress in terms of his rehab.  He is not having surgical issues  at this time.  He will continue with cardiac rehab.  We will help him to  set up an appointment with Dr. Riley Kill as he has not seen him since he  left the rehab.  I would be happy to see him back anytime if I can be of  any further assistance with his care.   Salvatore Decent Dorris Fetch, M.D.  Electronically Signed   SCH/MEDQ  D:  10/30/2008  T:  10/31/2008  Job:  [id number]   cc:   Arturo Morton. Riley Kill,  MD, [facility]  Stacie Glaze, MD

## 2010-11-05 NOTE — H&P (Signed)
Zachary Decker, Zachary Decker               ACCOUNT NO.:  [id number]   MEDICAL RECORD NO.:  [id number]          PATIENT TYPE:  INP   LOCATION:  2313                         FACILITY:  [facility]   PHYSICIAN:  Arturo Morton. Riley Kill, MD, FACCDATE OF BIRTH:  1933-01-28   DATE OF ADMISSION:  08/28/2008  DATE OF DISCHARGE:                              HISTORY & PHYSICAL   CHIEF COMPLAINT:  Shoulder pain.   HISTORY OF PRESENT ILLNESS:  The patient is a 77 year old who walked  into the emergency room tonight being brought by his daughter.  He had  been hitting golf balls earlier and had fairly intense left shoulder  pain.  EKG in the emergency room was consistent with a marked ST  elevation in the inferior leads with reciprocal change.  He was felt to  have an inferior wall infarction.  While in the emergency room, the  patient had two episodes of ventricular fibrillation requiring  cardioversion.  He was successfully resuscitated, and preparations were  made for urgent reperfusion therapy.  This was explained to the patient  in detail, who is alert between episodes of VF.   PAST MEDICAL HISTORY:  Remarkable for:  1. Hypertension.  2. The patient also has hyperlipidemia.  3. Hypothyroidism.  He has not been operated on.   ALLERGIES:  The patient has a PENICILLIN allergy.   MEDICATIONS:  1. Amlodipine 5 mg daily.  2. Fenofibrate 160 mg daily.  3. Synthroid 50 mcg daily.  4. Pepcid 20 mg daily.   FAMILY HISTORY:  Unobtainable.   SOCIAL HISTORY:  Nonsmoker and married.  He is retired.   REVIEW OF SYSTEMS:  Not pursued in the setting of acute MI.   PHYSICAL EXAMINATION:  GENERAL:  The patient was alert and oriented  after cardioversion.  VITAL SIGNS:  Blood pressure was 100/60, pulse was 40.  LUNGS:  Lung fields were clear anteriorly.  HEART:  Cardiac rhythm was regular.  Pulses were intact.  NEUROLOGIC:  Grossly nonfocal.   Electrocardiogram is compatible with inferior wall myocardial  infarction, acute.   RECOMMENDATIONS:  The patient was brought to the emergency room by his  daughter.  EKG is compatible with acute infarction.  Attempt at  reperfusion is recommended.      Arturo Morton. Riley Kill, MD, [facility]  Electronically Signed     TDS/MEDQ  D:  08/29/2008  T:  08/29/2008  Job:  [id number]

## 2010-11-05 NOTE — Op Note (Signed)
[name], [name]               ACCOUNT NO.:  [id number]   MEDICAL RECORD NO.:  [id number]          PATIENT TYPE:  INP   LOCATION:  2313                         FACILITY:  [facility]   PHYSICIAN:  Salvatore Decent. Dorris Fetch, M.D.DATE OF BIRTH:  August 14, 1932   DATE OF PROCEDURE:  09/14/2008  DATE OF DISCHARGE:                               OPERATIVE REPORT   PREOPERATIVE DIAGNOSIS:  Ventilatory-dependent respiratory failure.   POSTOPERATIVE DIAGNOSIS:  Ventilatory-dependent respiratory failure.   PROCEDURE:  Percutaneous tracheostomy placement, 8-French Shiley  tracheostomy tube.   SURGEON:  Salvatore Decent. Dorris Fetch, MD   ANESTHESIA:  General.   FINDINGS:  Tracheostomy performed without complication.   CLINICAL NOTE:  [name] is a 77 year old gentleman who undergone  emergent coronary artery bypass grafting for myocardial infarction  complicated by cardiogenic shock.  He subsequently developed ventilatory-  dependent respiratory failure pneumonia.  He has failed to wean from the  ventilator, needs a tracheostomy for pulmonary toilet, patient comfort,  and weaning purposes.  The indications, risks, benefits, and  alternatives were discussed in detail with the patient's wife, she  understood and accepted the risks and agreed to proceed.   OPERATIVE NOTE:  Mr. Haueter was brought to the operating room on September 14, 2008.  He was already intubated.  General anesthesia was induced.  The neck was prepped and draped in the usual sterile fashion.  The cuff  on the endotracheal tube was deflated, and his endotracheal tube was  pulled back to just below the vocal cords.  A small incision was made in  the midline.  The trachea was entered with a needle, and a wire was  advanced through the needle without resistance.  The tract was dilated.  A 26-French introducer was placed through the tracheostomy tube.  The  dilator was removed.  The tracheostomy tube was inserted over the wire,  passed easily  without resistance.  The endotracheal tube was removed.  The cuff on the tracheal tube was inflated.  The cuff was then passed.  The ventilatory tubing then was passed onto the field and connected to  the tracheostomy tube.  There was a positive end-tidal CO2 tracing.  The  patient maintained 100% oxygen saturation during the procedure.  The  flange of the tracheostomy tube was secured with 2-0 Prolene sutures and  a tracheostomy tie.  The patient then was transported from the operating  room to the Surgical Intensive Care Unit.      Salvatore Decent Dorris Fetch, M.D.  Electronically Signed     SCH/MEDQ  D:  09/14/2008  T:  09/15/2008  Job:  [id number]

## 2010-11-05 NOTE — H&P (Signed)
[name], Decker               ACCOUNT NO.:  [id number]   MEDICAL RECORD NO.:  [id number]          PATIENT TYPE:  INP   LOCATION:  2313                         FACILITY:  [facility]   PHYSICIAN:  Erick Colace, M.D.DATE OF BIRTH:  1933-01-24   DATE OF ADMISSION:  08/28/2008  DATE OF DISCHARGE:  10/04/2008                              HISTORY & PHYSICAL   REASON FOR ADMISSION:  Deconditioning after CABG, history of recent  acute MI and cardiac arrest.   HISTORY:  A 77 year old male with hypertension and hyperlipidemia  presented on August 28, 2008, with acute left shoulder pain while hitting  golf balls.  The EKG showed marked ST-T elevations with episodes of VFib  that required cardioversion and cardiac catheterization totally occluded  left circumflex and right coronary arteries, emergent CABG x5 August 29, 2008, per Dr. Dorris Fetch.  Placed on aspirin and subcu Lovenox  postoperatively.   HOSPITAL COURSE:  With VDRF and prolonged intubation underwent  tracheostomy placed on September 14, 2008 per Dr. Dorris Fetch, weaned on  ventilator September 29, 2008.  Modified barium swallow September 22, 2008, n.p.o.  except for ice chips, has NG tube feeds, trache changed to a #6 cuffless  on September 29, 2008, and then subsequently to a #4 with Passy-Muir valve in  place at all times.  Therapy initiated slow progress secondary to  deconditioning and fatigue factors also.   PAST MEDICAL HISTORY:  Significant for football injury causing left knee  deformity but has been able to play golf with it.  No bouts of chest  pain noted while the patient was in physical therapy postoperatively.  He has had routine sternal precautions with no significant postoperative  chest pain.  Blood sugars in the 120-150 range while on tube feeds and  placed on Lantus insulin.   PM&R consulted and rehab MD felt the patient to be good rehab candidate.  Also PT/OT felt same.   REVIEW OF SYSTEMS:  Positive for cough,  postoperative chest pain, reflux  and weakness.  See inpatient written history form for full review.   PAST HISTORY:  1. Hypertension.  2. Hyperlipidemia.  3. Hypoparathyroidism.   HABITS:  Negative tobacco.  Negative EtOH.   FAMILY HISTORY:  Positive CAD.   SOCIAL HISTORY:  Married, retired.  Wife can assist as needed.  Daughter  can also assist, one-level home and one step to enter.   FUNCTIONAL HISTORY:  Independent driving prior to admission.   FUNCTIONAL STATUS:  Total assist x3.  The patient 80% 24 feet x2.  Total  assist x2 transfer, needs assist with upper body ADLs, total assist with  lower body ADLs.   HOME MEDICATIONS:  1. Norvasc 5 mg daily.  2. TriCor 160 daily.  3. Synthroid 50 mcg daily.  4. Pepcid 20 mg p.r.n.  5. Aspirin 81 mg daily.   ALLERGIES:  PENICILLIN.   CURRENT MEDICATIONS:  1. Aspirin 325 via tube daily.  2. Protonix 40 mg via tube daily.  3. Synthroid 50 mcg via tube daily.  4. Lantus insulin 40 units subcu at bedtime.  5. Xopenex 0.63  mg respiratory treatment every 6 hours.  6. Lasix 40 mg via tube daily.  7. Seroquel 50 mg via tube at bedtime.  8. Crestor 20 mg via tube daily at bedtime.  9. Lopressor 6.25 via tube q.12.  10.Lovenox subcu 40 mg daily.  11.Tube feeds and Jevity 1.5 cal 60 mL/hour per NG tube.   PHYSICAL EXAMINATION:  VITALS:  O2 sat 93% on room air, blood pressure  197/52, pulse 90, temperature 98, respirations 18.  GENERAL:  No acute distress.  HEENT:  Eyes anicteric, noninjected.  External ENT normal.  NECK:  Supple without adenopathy.  RESPIRATORY:  Good.  Lungs show some crackles at bilateral bases,  decreased breath sounds.  Chest palpation mild tenderness along the  incisional line.  He has two drain sites that are healing well inferior  to the costal margin bilaterally.  HEART:  Regular rate and rhythm.  No murmurs, rubs, or extra sounds.  ABDOMEN:  Positive bowel sounds, soft and nontender to palpation.   EXTREMITIES: No clubbing, cyanosis, or edema.  Has valgus deformity left  knee with decreased range of motion.  Lacks about 20 degrees extension  and lacks about 20 degrees of flexion.  Sensation is normal in upper and  lower extremities.  Orientation, mood, memory and affect are all normal.   Manual muscle testing is 4/5 bilateral deltoid, biceps, triceps, grip,  3/5 left knee extensor, 4/5 right knee extensor, 4/5 bilateral hip  flexor and ankle dorsiflexors.   POST ADMISSION PHYSICIAN EVALUATION:  1. Functional deficits secondary to deconditioning status post MI,      status post CABG.  2. The patient admitted to receive collaborative, interdisciplinary      care between the physiatrist, rehab nursing staff and therapy team.  3. The patient's level of medical complexity and substantial therapy      needs in context with the medical necessity cannot be provided at a      lesser intensity of care.  4. The patient experienced functional loss from his baseline.  Upon      functional assessment on the time of preadmission screening, the      patient was not fully evaluated.  This patient's functional      evaluation today total assist x3.  The patient 80% 24 feet x2,      ambulation total assist x2 transfers, mod assist upper body ADLs,      total assist lower body ADLs.   Judging by the patient's diagnosis, physical exam and functional  history, the patient has a potential to make functional progress which  will result in measurable gains while on inpatient rehab.  These gains  will be of substantial practical use in discharge to home in  facilitating mobility, self-care and independence.  Interim changes in  medical status since the preadmission screen are detailed in history of  present illness.  1. Physiatrist will provide 24-hour rehab medicine management of      medical needs as well as oversight of therapy plan/treatment to      provide guidance as appropriate regarding the  interaction of the      two.  2. 24-hour rehab nursing will assist in management of wounds, bowel      and bladder, tube feeds and help with integrated therapy concepts,      techniques and education.  3. PT will assess and treat for range of motion, strengthening, bed      mobility transfers, pre gait training, gait training, and  equipment.  Goals are for a modified independent level with a      walker or at least restrictive device upon discharge.  Modified      independent with mobility.  4. OT will assess and treat for range of motion, strengthening, ADLs,      cognitive and perceptual training, splinting, and equipment.  Goals      are for a modified independent level with upper body and lower body      ADLs upon discharge.  5. SLP will assess and treat for severe dysphagia.  Goals are for      initiating safe p.o. intake prior to discharge.  6. Case management and social work will assess and treat for      psychosocial issues and discharge planning.  7. Team conference will be held weekly to assess for the patient      progress, goals and to determine barriers to discharge.  8. The patient has demonstrated sufficient medical stability and      exercise capacity to tolerate at least 3 hours therapy per day at      least 5 days per week.   HOSPITAL LENGTH OF STAY:  One week.   PROGNOSIS FOR FUNCTIONAL IMPROVEMENT:  Good.   MEDICAL PROBLEM LIST AND PLAN:  1. Deconditioning, status post MI and CABG postoperative day #34.  He      is status post VDRF and trache, he has been downsized to a #4.  We      will wean trache by the time he discharges.  2. Dysphagia n.p.o.  Currently Speech Therapy to follow up and hope is      to initiate p.o. feeds in safe manner and avoid aspiration      pneumonia and hopefully upgrade so that we can discontinue tube      feeds.  The goal is to avoid G tube placement and to ensure proper      nutrition p.o.  3. Hypertension.  Continue Lopressor  and Lasix.  4. Hyperlipidemia.  Continue Crestor.  5. Hypothyroidism.  Continue Synthroid.  6. DVT prophylaxis subcu Lovenox.      Erick Colace, M.D.  Electronically Signed     AEK/MEDQ  D:  10/04/2008  T:  10/05/2008  Job:  [id number]   cc:   Stacie Glaze, MD  Salvatore Decent. Dorris Fetch, M.D.

## 2010-11-05 NOTE — Op Note (Signed)
NAMEARNO, CULLERS               ACCOUNT NO.:  [id number]   MEDICAL RECORD NO.:  [id number]          PATIENT TYPE:  INP   LOCATION:  2313                         FACILITY:  [facility]   PHYSICIAN:  Salvatore Decent. Dorris Fetch, M.D.DATE OF BIRTH:  20-Apr-1933   DATE OF PROCEDURE:  08/29/2008  DATE OF DISCHARGE:                               OPERATIVE REPORT   PREOPERATIVE DIAGNOSIS:  Acute myocardial infarction with cardiogenic  shock and multiple ventricular fibrillation arrest.   POSTOPERATIVE DIAGNOSIS:  Acute myocardial infarction with cardiogenic  shock and multiple ventricular fibrillation arrest.   PROCEDURE:  Emergency salvage median sternotomy, extracorporeal  circulation, coronary artery bypass grafting x5(left internal mammary  artery to left anterior descending, sequential saphenous vein graft to  ramus intermedius and obtuse marginal 1, sequential saphenous vein graft  to posterior descending and posterolateral), endoscopic vein harvest of  right leg.   SURGEON:  Salvatore Decent. Dorris Fetch, MD   ASSISTANT:  Coral Ceo, PA   ANESTHESIA:  General.   FINDINGS:  Ramus intermedius and OM-1 fair quality targets; posterior  descending, posterolateral, and LAD good-quality targets, good-quality  conduits; resolution of the ST elevation with initial cardiopulmonary  bypass; mild mitral insufficiency by echocardiogram, pre and post-  bypass; severe inferolateral hypokinesis in the setting of global  hypokinesis.   CLINICAL NOTE:  Mr. Gangemi is a 77 year old gentleman who presented  with 6-hour history of chest pain.  He was found to have an ST-elevation  MI and was taken emergently to the Cardiac Catheterization Laboratory by  Dr. Shawnie Pons and Dr. Rollene Rotunda.  There, he was found to have  totally occluded left circumflex and right coronary arteries.  He also  had severe disease in his left anterior descending.  The patient had had  multiple episodes of ventricular  fibrillation which were brief and  treated immediately in the emergency room and also at the initiation of  the catheterization procedure had several more episodes.  Dr. Riley Kill  was unable to pass a wire through the right coronary artery which is  felt to be the culprit vessel.  I was consulted and arrived shortly  thereafter.  Of note, the patient had 3 episodes of ventricular  tachycardia progressing to ventricular fibrillation requiring  defibrillation with an approximately 10-minute timeframe from the time  of my arrival to the Catheterization Laboratory.  An anterior balloon  pump was placed.  The patient had continued severe ST elevations.  He  was advised to go to the operating room for emergent high-risk salvage  coronary artery bypass grafting.  This was also discussed with the  patient's family.  He agreed to proceed.   OPERATIVE NOTE:  Mr. Staniszewski was brought from the Catheterization  Laboratory to the operating room on the early morning of August 29, 2008.  On arrival, the patient was in cardiogenic shock.  He was tachycardic  and hypotensive.  He had severe diffuse ST elevation.  Anesthesia  Service placed in a sleeve introducer in the anterior jugular.  Swan-  Ganz catheter was placed into the superior vena cava, but not advanced  to the  heart at this point.  There was a transvenous pacer in place.  Arterial blood pressure monitoring lines were placed.  The intraaortic  balloon pump was in place from the Catheterization Laboratory.  The  patient was anesthetized and intubated.  He remained hemodynamically  stable through that procedure and a Foley catheter was placed.  Transesophageal echocardiography was performed.  Please refer to the  separately dictated note for details of that, but did severe global  hypokinesis with more severe hypokinesis in the anterolateral wall and  mild mitral insufficiency.  The chest, abdomen, and legs were prepped  and draped in the usual  fashion.  A median sternotomy was performed.  Simultaneously, incision was made in the medial aspect of the right leg  at the level of the knee.  The greater saphenous vein was harvested  endoscopically.  Immediately after performing the median sternotomy, the  patient was fully heparinized, the pericardium was opened, the ascending  aorta was inspected.  There was no atherosclerotic disease.  The aorta  was cannulated via concentric 2-0 Ethibond pledgeted purse-string  sutures.  A dual-stage venous cannula was placed via purse-string suture  in the right atrial appendage.  Cardiopulmonary bypass was instituted.  Flows were maintained per protocol.  There was immediate resolution of  the ST elevation with initiation of cardiopulmonary bypass and decision  was made to proceed with takedown of the left mammary artery.  This was  then taken down using standard technique and was a good-quality vessel.  The coronary arteries then were inspected and anastomotic sites were  chosen.  The patient was cooled to 32 degrees Celsius.  A foam pad was  placed in the pericardium to insulate the heart to protect the left  phrenic nerve.  A temperature probe was placed in the myocardial septum.  Retrograde cardioplegic cannula was placed in the right atrium and  directed in the coronary sinus.  The antegrade cardioplegic cannula was  placed in the ascending aorta.   The aorta was cross-clamped.  The left ventricle was emptied via the  aortic root vent.  Cardiac arrest was then achieved with a combination  of cold antegrade and retrograde blood cardioplegia and topical iced  saline.  After achieving a complete diastolic arrest and adequate  myocardial septal cooling, the following distal anastomoses were  performed.   First, reverse saphenous vein graft was placed sequentially to the  posterior descending and posterolateral branches of the right coronary.  These were both 1.5 mm, good-quality targets.   The vein graft was  anastomosed side-to-side to the posterior descending and end-to-side to  the posterolateral.  Both anastomoses were performed using running 7-0  Prolene sutures.  All anastomoses were probed proximally and distally at  their completion to ensure patency.  Cardioplegia was administered down  the each vein graft at the completion of the second anastomosis.  There  was good flow through the graft.  Cardioplegia was administered.  Additional cardioplegia was also administered via the retrograde  cannula.   Next, a reverse saphenous vein graft was placed sequentially to the  ramus intermedius and first obtuse marginal.  These were both fair-  quality targets, thin walled, and less than 1.5 mm in diameter.  The  vein graft was of good quality.  The anastomoses were performed with  running 7-0 Prolene sutures.  There was good flow through this graft.  Cardioplegia was administered.  There was good hemostasis at the  anastomoses.   Next, the internal mammary  artery was brought through a window in the  pericardium.  The distal end was beveled and was anastomosed end-to-side  to the distal LAD.  The distal LAD was a 1.5 mm, good-quality target.  Mammary was good quality conduit.  The anastomosis was performed with a  running 8-0 Prolene suture.  At completion of the mammary to LAD and  anastomosis, the bulldog clamp was briefly removed and inspected for  hemostasis.  Immediate rapid septal rewarming was noted.  The bulldog  clamp was replaced and the mammary pedicle was tacked to the epicardial  surface and heart with 6-0 Prolene sutures.  Additional cardioplegia was  administered both via the vein grafts as well as the retrograde cannula.  Rewarming was initiated.  The proximal vein graft anastomoses were then  performed to 4.5 mL punch aortotomies with running 6-0 Prolene sutures.  At the completion of the final proximal anastomosis, a warm dose of  retrograde cardioplegia  was administered.  The aortic root was deaired.  Lidocaine was administered.  The patient was already on an amiodarone  drip and after deairing the aortic root the aortic crossclamp was  removed.  The total crossclamp time was 85 minutes.   The patient did require defibrillation.  While the patient was being  rewarmed, all proximal and distal anastomoses were inspected for  hemostasis.  A dopamine infusion was initiated.  The patient was also  loaded with milrinone, and an milrinone infusion was initiated.  Epicardial pacing wires were placed on the right ventricle and right  atrium.  The patient rewarmed to a core temperature of 37 degrees  Celsius.  He was weaned from cardiopulmonary bypass on dopamine and  milrinone infusion as well as DDD paced at 90 beats per minute.  The  intraaortic balloon pump was turned back on at one-to-one at the time of  separation from bypass.  The patient weaned from bypass and was  profoundly hypotensive requiring significant volume administration and  high-dose Neo-Synephrine as well as increasing the dopamine infusion.  The cardiac index was greater than 2 liters per minute per meter  squared.  Milrinone infusion was decreased and then finally  discontinued.  Post-bypass transesophageal echocardiography revealed no  change in the mild mitral regurgitation.  There was no significant  change in the left ventricular function, but the left ventricle was  relatively underfilled.  Subsequently, epinephrine and Levophed  infusions were added with improvement of the patient's blood pressure.  Total bypass time was 173 minutes.   Test dose of protamine was administered and was tolerated without any  hemodynamic consequence.  Remainder of the protamine was administered  without incident.  The atrial aortic cannulae were removed.  Remainder  of the protamine was administered without any hemodynamic difficulties.  Hemostasis was achieved, but was only fair.  No  attempts were made to  close the pericardium.  The left pleural and two mediastinal chest tubes  were placed in separate subcostal incisions.  The sternum was closed  with interrupted heavy gauge stainless steel wires.  The pectoralis  fascia, subcutaneous tissue, and skin were closed in the standard  fashion.  All sponge, needle, and instrument counts were correct at the  end of the procedure.  The patient was transported from the operating  room to the Surgical Intensive Care Unit in critical condition with a  guarded prognosis.      Salvatore Decent Dorris Fetch, M.D.  Electronically Signed     SCH/MEDQ  D:  08/31/2008  T:  09/01/2008  Job:  [id number]   cc:   Arturo Morton. Riley Kill, MD, [facility]

## 2010-11-05 NOTE — Discharge Summary (Signed)
Zachary Decker, Zachary Decker               ACCOUNT NO.:  [id number]   MEDICAL RECORD NO.:  [id number]          PATIENT TYPE:  IPS   LOCATION:  4006                         FACILITY:  [facility]   PHYSICIAN:  Zachary Decker, M.D.DATE OF BIRTH:  1933-05-17   DATE OF ADMISSION:  10/04/2008  DATE OF DISCHARGE:  10/24/2008                               DISCHARGE SUMMARY   DISCHARGE DIAGNOSES:  1. Deconditioning status post myocardial infarction with coronary      artery bypass graft x5 on August 29, 2008.  2. Ventilator-dependent respiratory failure with tracheostomy on September 14, 2008, - decannulated.  3. Dysphagia.  4. Hypertension.  5. Hyperlipidemia.  6. Hypothyroidism.  7. Peripheral vascular disease.  8. Pseudomonas urinary tract infection.  9. Subcutaneous Lovenox for deep vein thrombosis prophylaxis.   This is a 77 year old white male, presented on August 28, 2008, with acute  left shoulder pain while hitting golf balls.  EKG with marked ST  elevation with 2 episodes of V-fib required cardioversion.  Cardiac  catheterization with totally occluded left circumflex and right coronary  arteries.  Underwent emergent coronary artery bypass graft x5 on August 29, 2008, per Dr. Dorris Decker, placed on aspirin therapy, as well as  subcutaneous Lovenox.  Hospital course with VDRF, prolonged intubation,  underwent tracheostomy placement on September 14, 2008, per Dr. Dorris Decker  and weaned from ventilator.  Modified barium swallow on September 22, 2008,  advised n.p.o. with nasogastric tube feeds.  Tracheostomy tube was  downsized to a #4 with PMV valve in place, which he tolerated nicely.  Therapies were initiated with slow progressive gains secondary to  deconditioning and fatigue episodes.  He remained on sternal precautions  after coronary artery bypass grafting.  He was seen by inpatient rehab  services, felt to be a good candidate for comprehensive rehab program.   PAST MEDICAL HISTORY:  See  discharge diagnoses.  No alcohol or tobacco.   ALLERGIES:  PENICILLIN.   SOCIAL HISTORY:  Married, retired.  Wife can assist as needed.  Daughter  can also assist.  One-level home, 1 step to entry.   FUNCTIONAL HISTORY:  Prior to admission was independent driving.  Functional status upon admission to rehab services with total assist +3,  24 feet x2, total assist transfers, moderate assist upper body  activities of daily living, total assist lower body activities of daily  living.   MEDICATIONS PRIOR TO ADMISSION:  1. Norvasc 5 mg daily.  2. TriCor 160 mg daily.  3. Synthroid 50 mcg daily.  4. Pepcid as needed.  5. Aspirin 81 mg daily.  6. Multivitamin daily.   PHYSICAL EXAMINATION:  VITAL SIGNS:  Blood pressure 97/52, pulse 90,  temperature 98, respirations 18.  GENERAL:  This was an alert male, in no acute distress, oriented x3.  Deep tendon reflexes 2+.  He followed 3-step commands.  CHEST:  Chest incision was clean and dry.  NECK:  Tracheostomy site was intact with a PMV valve in place.  EXTREMITIES:  Sensation intact to light touch in bilateral feet with  toes and necrotic left  great toe with toenail since gone.  He did have  palpable pedal pulses.  LUNGS:  Decreased breath sounds.  CARDIAC:  Regular rate and rhythm.  ABDOMEN:  Soft, nontender.  Good bowel sounds.   REHABILITATION HOSPITAL COURSE:  The patient was admitted to inpatient  rehab services with therapies initiated on a 3-hour daily basis  consisting of physical therapy, occupational therapy, speech therapy,  and rehabilitation nursing.  The following issues were addressed during  the patient's rehabilitation stay:  Pertaining to Zachary Decker  deconditioning with myocardial infarction, coronary artery bypass  grafting x5 on August 29, 2008, surgical site healing nicely, sternal  precautions ongoing, he would follow up with Dr. Dorris Decker.  His  tracheostomy tube had since been removed, oxygen saturations  greater  than 90% on room air.  He denied any shortness of breath.  His diet had  since been advanced to a dysphagia 2 with honey thick liquids,  medications crushed in puree with close monitoring of his hydration  while on honey liquids.  Latest BUN of 25, creatinine 1.23 on Oct 23, 2008.  His blood pressures were monitored on low doses of Lasix.  He  exhibited no signs of fluid overload.  He did have bilateral feet and  necrotic toes with hydrogel to bilateral toes, change daily, and covered  with Kerlix.  A home health nurse would be arranged for monitoring of  his skin.  He did complete a 7-day course of Cipro for Pseudomonas  urinary tract infection during his rehab course.   Weekly collaborative interdisciplinary team conferences were held to  discuss the patient's estimated length of stay, any barriers to  discharge, and ongoing family teaching.  He was supervision to minimal  assist overall for his mobility, supervision to minimal assist for  activities of daily living, moderate assist toileting.  He did have some  mild short-term memory deficits, but he exhibited no unsafe behavior and  was able to communicate his needs without difficulty.  Strength and  endurance continued to improve during his rehab course.   LATEST LABORATORY DATA:  Sodium 133, potassium 3.9, BUN 25, creatinine  1.23.  Hemoglobin 11.1, hematocrit 32.8, platelet 259,000, WBC of 12.   DISCHARGE MEDICATIONS AT THE TIME OF DICTATION:  1. Crestor 20 mg daily.  2. Lopressor 6.25 mg every 12 hours.  3. Synthroid 50 mcg daily.  4. Aspirin 325 mg daily.  5. Flomax 0.4 mg at bedtime.  6. Lasix 40 mg daily.  7. Potassium chloride 20 mEq daily.  8. Tylenol as needed.  His subcutaneous Lovenox was discontinued at the time of discharge.   DIET:  Dysphagia II honey thick liquids.   SPECIAL INSTRUCTIONS:  Home health nurse for monitoring of trach site  with Band-Aid in place, change daily; hydrogel to bilateral  toes, change  daily as needed.  He would follow up with Dr. Dorris Decker,  Cardiothoracic Surgery, as advised, as well as Vascular Surgery if  needed for ongoing ischemic changes of his toes, follow up with Dr. Darryll Decker, medical management, Dr. Faith Rogue, Outpatient Rehab, only  as needed.      Mariam Dollar, P.A.      Zachary Decker, M.D.  Electronically Signed    DA/MEDQ  D:  10/23/2008  T:  10/23/2008  Job:  [id number]   cc:   Salvatore Decent. Zachary Decker, M.D.  Stacie Glaze, MD  [facility]

## 2010-11-14 ENCOUNTER — Other Ambulatory Visit: Payer: Self-pay | Admitting: Internal Medicine

## 2011-01-14 ENCOUNTER — Ambulatory Visit (INDEPENDENT_AMBULATORY_CARE_PROVIDER_SITE_OTHER): Payer: Medicare Other | Admitting: Internal Medicine

## 2011-01-14 ENCOUNTER — Encounter: Payer: Self-pay | Admitting: Internal Medicine

## 2011-01-14 ENCOUNTER — Encounter: Payer: Medicare Other | Admitting: Internal Medicine

## 2011-01-14 VITALS — BP 120/72 | HR 68 | Temp 98.2°F | Resp 16 | Ht 74.0 in | Wt 193.0 lb

## 2011-01-14 DIAGNOSIS — R498 Other voice and resonance disorders: Secondary | ICD-10-CM

## 2011-01-14 DIAGNOSIS — E7800 Pure hypercholesterolemia, unspecified: Secondary | ICD-10-CM

## 2011-01-14 DIAGNOSIS — K219 Gastro-esophageal reflux disease without esophagitis: Secondary | ICD-10-CM

## 2011-01-14 DIAGNOSIS — I10 Essential (primary) hypertension: Secondary | ICD-10-CM

## 2011-01-14 DIAGNOSIS — L570 Actinic keratosis: Secondary | ICD-10-CM

## 2011-01-14 DIAGNOSIS — G600 Hereditary motor and sensory neuropathy: Secondary | ICD-10-CM

## 2011-01-14 DIAGNOSIS — E039 Hypothyroidism, unspecified: Secondary | ICD-10-CM

## 2011-01-14 LAB — HEPATIC FUNCTION PANEL
AST: 33 U/L (ref 0–37)
Albumin: 4.3 g/dL (ref 3.5–5.2)

## 2011-01-14 LAB — LIPID PANEL
HDL: 42.7 mg/dL (ref >39.00)
LDL Cholesterol: 44 mg/dL (ref 0–99)
Total CHOL/HDL Ratio: 3
Triglycerides: 105 mg/dL (ref 0.0–149.0)

## 2011-01-14 LAB — BASIC METABOLIC PANEL
Calcium: 9.1 mg/dL (ref 8.4–10.5)
Creatinine, Ser: 1.2 mg/dL (ref 0.4–1.5)
GFR: 62.04 mL/min (ref >60.00)
Glucose, Bld: 110 mg/dL — ABNORMAL HIGH (ref 70–99)
Sodium: 141 mEq/L (ref 135–145)

## 2011-01-14 LAB — VITAMIN B12: Vitamin B-12: 1307 pg/mL — ABNORMAL HIGH (ref 211–911)

## 2011-01-14 MED ORDER — TAMSULOSIN HCL 0.4 MG PO CAPS: 0.4000 mg | ORAL_CAPSULE | ORAL | Status: DC

## 2011-01-14 MED ORDER — LISINOPRIL 20 MG PO TABS: 20.0000 mg | ORAL_TABLET | Freq: Every day | ORAL | Status: DC

## 2011-01-14 MED ORDER — CARVEDILOL 3.125 MG PO TABS: 3.1250 mg | ORAL_TABLET | Freq: Two times a day (BID) | ORAL | Status: DC

## 2011-01-14 MED ORDER — RANITIDINE HCL 300 MG PO TABS: 300.0000 mg | ORAL_TABLET | Freq: Every day | ORAL | Status: DC

## 2011-01-14 MED ORDER — LEVOTHYROXINE SODIUM 50 MCG PO TABS: 50.0000 ug | ORAL_TABLET | Freq: Every day | ORAL | Status: DC

## 2011-01-14 NOTE — Progress Notes (Signed)
  Subjective:    Patient ID: Zachary Decker, male    DOB: 04-Oct-1932, 77 y.o.   MRN: [id number]  HPI  error  Review of Systems     Objective:   Physical Exam        Assessment & Plan:

## 2011-01-14 NOTE — Progress Notes (Signed)
Addended by: Bonnye Fava on: 01/14/2011 10:03 AM   Modules accepted: Orders

## 2011-01-14 NOTE — Progress Notes (Signed)
Subjective:    Patient ID: Zachary Decker, male    DOB: 1933-03-20, 77 y.o.   MRN: [id number]  HPI Patient has a history of reflux esophagitis has been on a proton pump inhibitor when we substituted Prevacid for the next 2 the patient developed loose stools associated with the use of the drug.  He is use over-the-counter Tagamet to effectively control his reflux as needed.  His blood pressure is stable and he is taking his current medication as prescribed he denies any current abnormal chest pain or abnormal shortness of breath or swelling in his extremities.  Today he will require basic mild metabolic panel a lipid and a liver panel He reports that a prior treated actinic keratosis on his ear has reappeared and he has a history of basal cell carcinoma on his nose   Review of Systems  Constitutional: Negative for fever and fatigue.  HENT: Negative for hearing loss, congestion, neck pain and postnasal drip.   Eyes: Negative for discharge, redness and visual disturbance.  Respiratory: Negative for cough, shortness of breath and wheezing.   Cardiovascular: Negative for leg swelling.  Gastrointestinal: Negative for abdominal pain, constipation and abdominal distention.  Genitourinary: Negative for urgency and frequency.  Musculoskeletal: Negative for joint swelling and arthralgias.  Skin: Negative for color change and rash.  Neurological: Negative for weakness and light-headedness.  Hematological: Negative for adenopathy.  Psychiatric/Behavioral: Negative for behavioral problems.      Past Medical History  Diagnosis Date  . CAD (coronary artery disease)   . Acute myocardial infarction of inferoposterior wall, subsequent episode of care   . Hypertension   . Hypercholesterolemia   . PVD (peripheral vascular disease)   . Personal history of thrombophlebitis   . Encounter for long-term (current) use of other medications   . Respiratory failure   . GERD (gastroesophageal reflux  disease)   . Hypertrophy of prostate with urinary obstruction and other lower urinary tract symptoms (LUTS)   . Unspecified adverse effect of unspecified drug, medicinal and biological substance   . Unspecified hypothyroidism   . Family history of malignant neoplasm of gastrointestinal tract    Past Surgical History  Procedure Date  . Tonsillectomy   . Knee surgery   . Coronary artery bypass graft     x 5 on August 29, 2008  . Tachecostomy placement     reports that he has never smoked. He does not have any smokeless tobacco history on file. He reports that he does not drink alcohol or use illicit drugs. family history includes Coronary artery disease in his other; Heart disease in his mother; and Tuberculosis in his father. Allergies  Allergen Reactions  . Losartan Potassium   . Naproxen   . Penicillins     Objective:   Physical Exam  Nursing note and vitals reviewed. Constitutional: He appears well-developed and well-nourished.  HENT:  Head: Normocephalic and atraumatic.  Eyes: Conjunctivae are normal. Pupils are equal, round, and reactive to light.  Neck: Normal range of motion. Neck supple.  Cardiovascular: Normal rate and regular rhythm.   Pulmonary/Chest: Effort normal and breath sounds normal.  Abdominal: Soft. Bowel sounds are normal.  Musculoskeletal: He exhibits edema.       Lower extremity muscle loss and weekness  Skin: Skin is warm and dry.         Assessment & Plan:  we will discontinue the Prevacid and replaced the medication with prescription Zantac Hopefully this will result in resolution of the diarrhea from  the PPI.  Patient is doing well on his current medications a basic metabolic panel will be drawn to monitor his potassium and creatinine a thyroid panel with a T4 free will be drawn to look at his hypothyroidism and his cholesterol will be monitored today.  He gave informed consent for cryotherapy and  Informed consent was obtained in the lesion  was treated for 60 seconds of liquid nitrogen application the patient tolerated the procedure well as procedural care was discussed with the patient and instructions should the lesion reappears contact our office immediately

## 2011-01-15 ENCOUNTER — Telehealth: Payer: Self-pay | Admitting: Internal Medicine

## 2011-01-15 NOTE — Telephone Encounter (Signed)
Pt used to be on Lisinopril 10 mg and pt noticed that the new script given yesterday, is for 20 mg. Is this correct. Pt has not started taken the 20 mg yet, until he gets answer. Pls call and leave a detailed message on vm with dosage info.

## 2011-03-28 ENCOUNTER — Ambulatory Visit (INDEPENDENT_AMBULATORY_CARE_PROVIDER_SITE_OTHER): Payer: Medicare Other

## 2011-03-28 ENCOUNTER — Telehealth: Payer: Self-pay | Admitting: Internal Medicine

## 2011-03-28 DIAGNOSIS — Z23 Encounter for immunization: Secondary | ICD-10-CM

## 2011-03-28 MED ORDER — LISINOPRIL 10 MG PO TABS: 10.0000 mg | ORAL_TABLET | Freq: Every day | ORAL | Status: DC

## 2011-03-28 NOTE — Telephone Encounter (Signed)
Sent in

## 2011-03-28 NOTE — Telephone Encounter (Signed)
Pt requesting refill on Lisinopril 10mg . Pt said last time he was prescribed 20mg  and was instructed to break in half pt does not want to do this he wants 10mg . Please call in to [facility] [address] Kathryne Sharper

## 2011-05-23 ENCOUNTER — Other Ambulatory Visit: Payer: Self-pay | Admitting: Internal Medicine

## 2011-06-13 ENCOUNTER — Ambulatory Visit: Payer: Medicare Other | Admitting: Internal Medicine

## 2011-06-18 ENCOUNTER — Other Ambulatory Visit: Payer: Self-pay | Admitting: Internal Medicine

## 2011-06-19 ENCOUNTER — Ambulatory Visit (INDEPENDENT_AMBULATORY_CARE_PROVIDER_SITE_OTHER): Payer: Medicare Other | Admitting: Internal Medicine

## 2011-06-19 ENCOUNTER — Encounter: Payer: Self-pay | Admitting: Internal Medicine

## 2011-06-19 ENCOUNTER — Telehealth: Payer: Self-pay | Admitting: Cardiology

## 2011-06-19 VITALS — BP 124/76 | HR 60 | Temp 98.3°F | Resp 16 | Ht 74.5 in | Wt 193.0 lb

## 2011-06-19 DIAGNOSIS — E785 Hyperlipidemia, unspecified: Secondary | ICD-10-CM

## 2011-06-19 DIAGNOSIS — I2510 Atherosclerotic heart disease of native coronary artery without angina pectoris: Secondary | ICD-10-CM

## 2011-06-19 DIAGNOSIS — I10 Essential (primary) hypertension: Secondary | ICD-10-CM

## 2011-06-19 DIAGNOSIS — I255 Ischemic cardiomyopathy: Secondary | ICD-10-CM

## 2011-06-19 LAB — HEPATIC FUNCTION PANEL
AST: 29 U/L (ref 0–37)
Albumin: 4.1 g/dL (ref 3.5–5.2)
Alkaline Phosphatase: 45 U/L (ref 39–117)
Bilirubin, Direct: 0 mg/dL (ref 0.0–0.3)
Total Protein: 7 g/dL (ref 6.0–8.3)

## 2011-06-19 LAB — BASIC METABOLIC PANEL
CO2: 28 mEq/L (ref 19–32)
Glucose, Bld: 91 mg/dL (ref 70–99)
Potassium: 5.2 mEq/L — ABNORMAL HIGH (ref 3.5–5.1)
Sodium: 137 mEq/L (ref 135–145)

## 2011-06-19 LAB — LIPID PANEL
Total CHOL/HDL Ratio: 3
VLDL: 27.2 mg/dL (ref 0.0–40.0)

## 2011-06-19 NOTE — Telephone Encounter (Signed)
New problem:  Patient would like to be set up for echo in march.

## 2011-06-19 NOTE — Progress Notes (Signed)
Subjective:    Patient ID: Zachary Decker, male    DOB: [birth year], 76 y.o.   MRN: [id number]  HPI The pt has been doing well He has continued PT on his on at a [employer] [facility] and this has made the difference in his recovery. Hypertension is stable CAD stable and no reported chest pain with exercize.    Review of Systems  Constitutional: Negative for fever and fatigue.  HENT: Negative for hearing loss, congestion, neck pain and postnasal drip.   Eyes: Negative for discharge, redness and visual disturbance.  Respiratory: Negative for cough, shortness of breath and wheezing.   Cardiovascular: Negative for leg swelling.  Gastrointestinal: Negative for abdominal pain, constipation and abdominal distention.  Genitourinary: Negative for urgency and frequency.  Musculoskeletal: Negative for joint swelling and arthralgias.  Skin: Negative for color change and rash.  Neurological: Negative for weakness and light-headedness.  Hematological: Negative for adenopathy.  Psychiatric/Behavioral: Negative for behavioral problems.       Past Medical History  Diagnosis Date  . CAD (coronary artery disease)   . Acute myocardial infarction of inferoposterior wall, subsequent episode of care   . Hypertension   . Hypercholesterolemia   . PVD (peripheral vascular disease)   . Personal history of thrombophlebitis   . Encounter for long-term (current) use of other medications   . Respiratory failure   . GERD (gastroesophageal reflux disease)   . Hypertrophy of prostate with urinary obstruction and other lower urinary tract symptoms (LUTS)   . Unspecified adverse effect of unspecified drug, medicinal and biological substance   . Unspecified hypothyroidism   . Family history of malignant neoplasm of gastrointestinal tract     History   Social History  . Marital Status: Married    Spouse Name: N/A    Number of Children: N/A  . Years of Education: N/A   Occupational History  . Not on  file.   Social History Main Topics  . Smoking status: Never Smoker   . Smokeless tobacco: Not on file  . Alcohol Use: No  . Drug Use: No  . Sexually Active: Yes   Other Topics Concern  . Not on file   Social History Narrative  . No narrative on file    Past Surgical History  Procedure Date  . Tonsillectomy   . Knee surgery   . Coronary artery bypass graft     x 5 on August 29, 2008  . Tachecostomy placement     Family History  Problem Relation Age of Onset  . Coronary artery disease Other     positive  . Heart disease Mother   . Tuberculosis Father     Allergies  Allergen Reactions  . Losartan Potassium   . Naproxen   . Penicillins     Current Outpatient Prescriptions on File Prior to Visit  Medication Sig Dispense Refill  . aspirin 81 MG tablet Take 81 mg by mouth daily.        . B Complex-C-Folic Acid (FOLBEE PLUS) TABS TAKE 1 TABLET EVERY DAY  30 tablet  6  . B-Complex-C-Biotin-Minerals-FA (FOLBEE PLUS CZ) 5 MG TABS TAKE 1 TABLET EVERY DAY  30 tablet  3  . carvedilol (COREG) 3.125 MG tablet Take 1 tablet (3.125 mg total) by mouth 2 (two) times daily with a meal.  180 tablet  3  . levothyroxine (SYNTHROID, LEVOTHROID) 50 MCG tablet Take 1 tablet (50 mcg total) by mouth daily.  30 tablet  11  . lisinopril (PRINIVIL,ZESTRIL)  10 MG tablet Take 1 tablet (10 mg total) by mouth daily.  30 tablet  11  . rosuvastatin (CRESTOR) 5 MG tablet Take 5 mg by mouth daily.        . Tamsulosin HCl (FLOMAX) 0.4 MG CAPS Take 1 capsule (0.4 mg total) by mouth daily after supper.  30 capsule  11  . Tamsulosin HCl (FLOMAX) 0.4 MG CAPS TAKE 1 CAPSULE AT BEDTIME  30 capsule  5    BP 124/76  Pulse 60  Temp 98.3 F (36.8 C)  Resp 16  Ht 6' 2.5" (1.892 m)  Wt 193 lb (87.544 kg)  BMI 24.45 kg/m2    Objective:   Physical Exam  Nursing note and vitals reviewed. Constitutional: He appears well-developed and well-nourished.  HENT:  Head: Normocephalic and atraumatic.  Eyes:  Conjunctivae are normal. Pupils are equal, round, and reactive to light.  Neck: Normal range of motion. Neck supple.  Cardiovascular: Normal rate and regular rhythm.   Pulmonary/Chest: Effort normal and breath sounds normal.  Abdominal: Soft. Bowel sounds are normal.          Assessment & Plan:  The patient has stable cardiovascular disease his risk factors are hypertension age and hyperlipidemia monitor his control of cholesterol with his current medication Crestor his blood pressures and excellent control on his current medication of lisinopril and Coreg. Patient continues to do regular exercise which is key to his continued longevity and successful rehabilitation.  Patient will be provided and we will measure a lipid and liver, and renal function The patient is cleared for cataract surgery.

## 2011-06-19 NOTE — Patient Instructions (Signed)
The patient is instructed to continue all medications as prescribed. Schedule followup with check out clerk upon leaving the clinic  

## 2011-06-19 NOTE — Telephone Encounter (Signed)
The pt is due for an Echocardiogram. Order placed.

## 2011-09-08 ENCOUNTER — Ambulatory Visit (INDEPENDENT_AMBULATORY_CARE_PROVIDER_SITE_OTHER): Payer: Medicare Other | Admitting: Cardiology

## 2011-09-08 ENCOUNTER — Ambulatory Visit (HOSPITAL_COMMUNITY): Payer: Medicare Other | Attending: Cardiology

## 2011-09-08 ENCOUNTER — Other Ambulatory Visit: Payer: Self-pay

## 2011-09-08 ENCOUNTER — Encounter: Payer: Self-pay | Admitting: Cardiology

## 2011-09-08 VITALS — BP 138/76 | HR 61 | Ht 74.0 in | Wt 191.0 lb

## 2011-09-08 DIAGNOSIS — I2510 Atherosclerotic heart disease of native coronary artery without angina pectoris: Secondary | ICD-10-CM | POA: Insufficient documentation

## 2011-09-08 DIAGNOSIS — E785 Hyperlipidemia, unspecified: Secondary | ICD-10-CM | POA: Diagnosis not present

## 2011-09-08 DIAGNOSIS — I10 Essential (primary) hypertension: Secondary | ICD-10-CM

## 2011-09-08 DIAGNOSIS — I2589 Other forms of chronic ischemic heart disease: Secondary | ICD-10-CM

## 2011-09-08 DIAGNOSIS — I25810 Atherosclerosis of coronary artery bypass graft(s) without angina pectoris: Secondary | ICD-10-CM | POA: Diagnosis not present

## 2011-09-08 DIAGNOSIS — E7800 Pure hypercholesterolemia, unspecified: Secondary | ICD-10-CM

## 2011-09-08 DIAGNOSIS — E039 Hypothyroidism, unspecified: Secondary | ICD-10-CM

## 2011-09-08 DIAGNOSIS — I255 Ischemic cardiomyopathy: Secondary | ICD-10-CM

## 2011-09-08 NOTE — Progress Notes (Signed)
HPI:  He is doing well overall.  He is a little stiff in the shoulders, but otherwise is ok.  No new symptoms.  We reviewed his echo with him in the office today.    Current Outpatient Prescriptions  Medication Sig Dispense Refill  . aspirin 81 MG tablet Take 81 mg by mouth daily.        . B Complex-C-Folic Acid (FOLBEE PLUS) TABS TAKE 1 TABLET EVERY DAY  30 tablet  6  . carvedilol (COREG) 3.125 MG tablet Take 1 tablet (3.125 mg total) by mouth 2 (two) times daily with a meal.  180 tablet  3  . cimetidine (TAGAMET) 200 MG tablet Take 200 mg by mouth daily as needed.        Marland Kitchen levothyroxine (SYNTHROID, LEVOTHROID) 50 MCG tablet Take 1 tablet (50 mcg total) by mouth daily.  30 tablet  11  . lisinopril (PRINIVIL,ZESTRIL) 10 MG tablet Take 1 tablet (10 mg total) by mouth daily.  30 tablet  11  . rosuvastatin (CRESTOR) 5 MG tablet Take 5 mg by mouth daily.        . Tamsulosin HCl (FLOMAX) 0.4 MG CAPS Take 1 capsule (0.4 mg total) by mouth daily after supper.  30 capsule  11    Allergies  Allergen Reactions  . Losartan Potassium   . Naproxen   . Penicillins     Past Medical History  Diagnosis Date  . CAD (coronary artery disease)   . Acute myocardial infarction of inferoposterior wall, subsequent episode of care   . Hypertension   . Hypercholesterolemia   . PVD (peripheral vascular disease)   . Personal history of thrombophlebitis   . Encounter for long-term (current) use of other medications   . Respiratory failure   . GERD (gastroesophageal reflux disease)   . Hypertrophy of prostate with urinary obstruction and other lower urinary tract symptoms (LUTS)   . Unspecified adverse effect of unspecified drug, medicinal and biological substance   . Unspecified hypothyroidism   . Family history of malignant neoplasm of gastrointestinal tract     Past Surgical History  Procedure Date  . Tonsillectomy   . Knee surgery   . Coronary artery bypass graft     x 5 on August 29, 2008  .  Tachecostomy placement     Family History  Problem Relation Age of Onset  . Coronary artery disease Other     positive  . Heart disease Mother   . Tuberculosis Father     History   Social History  . Marital Status: Married    Spouse Name: N/A    Number of Children: N/A  . Years of Education: N/A   Occupational History  . Not on file.   Social History Main Topics  . Smoking status: Never Smoker   . Smokeless tobacco: Not on file  . Alcohol Use: No  . Drug Use: No  . Sexually Active: Yes   Other Topics Concern  . Not on file   Social History Narrative  . No narrative on file    ROS: Please see the HPI.  All other systems reviewed and negative.  PHYSICAL EXAM:  BP 138/76  Pulse 61  Ht 6\' 2"  (1.88 m)  Wt 191 lb (86.637 kg)  BMI 24.52 kg/m2  General: Well developed, well nourished, in no acute distress. Head:  Normocephalic and atraumatic.   Neck: no JVD.   Lungs: Clear to auscultation and percussion. Heart: Normal S1 and S2.  No murmur, rubs or gallops.  Abdomen:  Normal bowel sounds; soft; non tender; no organomegaly Pulses: Pulses normal in all 4 extremities. Extremities: No clubbing or cyanosis. No edema. Neurologic: Alert and oriented x 3.  EKG:  NSR.  Low voltage QRS.  Non specific T flattening.  Anterior MI, inferior MI, age indeterminate.  ECHO:  Reviewed with patient in office.    Study Conclusions  - Left ventricle: The cavity size was normal. Wall thickness was normal. Systolic function was moderately reduced. The estimated ejection fraction was in the range of 35% to 40%. There is akinesis of the inferoposterior myocardium. Doppler parameters are consistent with abnormal left ventricular relaxation (grade 1 diastolic dysfunction). - Mitral valve: Mild regurgitation. - Left atrium: The atrium was mildly dilated.     ASSESSMENT AND PLAN:

## 2011-09-08 NOTE — Patient Instructions (Addendum)
Your physician recommends that you continue on your current medications as directed. Please refer to the Current Medication list given to you today.  Your physician wants you to follow-up in: 1 YEAR with Dr Riley Kill.  You will receive a reminder letter in the mail two months in advance. If you don't receive a letter, please call our office to schedule the follow-up appointment.  Please have a BMP drawn with your Primary Care Provider.

## 2011-09-18 ENCOUNTER — Ambulatory Visit: Payer: Medicare Other | Admitting: Internal Medicine

## 2011-09-22 NOTE — Assessment & Plan Note (Signed)
Followed by Dr. Lovell Sheehan.

## 2011-09-22 NOTE — Assessment & Plan Note (Addendum)
No current symptoms.  Echo reviewed with patient.  Overall situation stable.  Continue on medical therapy, which includes ACE, statin, beta blocker, and antiplatelet therapy.

## 2011-09-22 NOTE — Assessment & Plan Note (Signed)
Well controlled on current treatment.

## 2011-09-22 NOTE — Assessment & Plan Note (Signed)
LDLs remain low and he is being followed by Dr. Lovell Sheehan.

## 2011-09-25 DIAGNOSIS — H2510 Age-related nuclear cataract, unspecified eye: Secondary | ICD-10-CM | POA: Diagnosis not present

## 2011-09-26 ENCOUNTER — Ambulatory Visit (INDEPENDENT_AMBULATORY_CARE_PROVIDER_SITE_OTHER): Payer: Medicare Other | Admitting: Internal Medicine

## 2011-09-26 ENCOUNTER — Encounter: Payer: Self-pay | Admitting: Internal Medicine

## 2011-09-26 VITALS — BP 122/78 | HR 68 | Temp 98.2°F | Resp 16 | Ht 74.0 in | Wt 190.0 lb

## 2011-09-26 DIAGNOSIS — E875 Hyperkalemia: Secondary | ICD-10-CM

## 2011-09-26 DIAGNOSIS — E785 Hyperlipidemia, unspecified: Secondary | ICD-10-CM

## 2011-09-26 DIAGNOSIS — N139 Obstructive and reflux uropathy, unspecified: Secondary | ICD-10-CM

## 2011-09-26 DIAGNOSIS — N400 Enlarged prostate without lower urinary tract symptoms: Secondary | ICD-10-CM

## 2011-09-26 DIAGNOSIS — K219 Gastro-esophageal reflux disease without esophagitis: Secondary | ICD-10-CM | POA: Diagnosis not present

## 2011-09-26 DIAGNOSIS — N401 Enlarged prostate with lower urinary tract symptoms: Secondary | ICD-10-CM

## 2011-09-26 DIAGNOSIS — T887XXA Unspecified adverse effect of drug or medicament, initial encounter: Secondary | ICD-10-CM | POA: Diagnosis not present

## 2011-09-26 MED ORDER — RANITIDINE HCL 300 MG PO TABS: 300.0000 mg | ORAL_TABLET | Freq: Every day | ORAL | Status: DC

## 2011-09-26 NOTE — Progress Notes (Signed)
Subjective:    Patient ID: Zachary Decker, male    DOB: [birth year], 78 y.o.   MRN: [id number]  HPI Pt has been stable playing gold and working out, he is on aspirin only. He had an echo with Dr Riley Kill and reports that he is stable. Stable blood pressure.  Monitoring of lipids. New complaints of acid reflux but if he takes a PPI he gets diarrhea, Maalox and Tagement helps We addressed the elevation in the potassium.    Review of Systems  Constitutional: Negative for fever and fatigue.  HENT: Negative for hearing loss, congestion, neck pain and postnasal drip.   Eyes: Negative for discharge, redness and visual disturbance.  Respiratory: Negative for cough, shortness of breath and wheezing.   Cardiovascular: Negative for leg swelling.  Gastrointestinal: Negative for abdominal pain, constipation and abdominal distention.  Genitourinary: Negative for urgency and frequency.  Musculoskeletal: Negative for joint swelling and arthralgias.  Skin: Negative for color change and rash.  Neurological: Negative for weakness and light-headedness.  Hematological: Negative for adenopathy.  Psychiatric/Behavioral: Negative for behavioral problems.   Past Medical History  Diagnosis Date  . CAD (coronary artery disease)   . Acute myocardial infarction of inferoposterior wall, subsequent episode of care   . Hypertension   . Hypercholesterolemia   . PVD (peripheral vascular disease)   . Personal history of thrombophlebitis   . Encounter for long-term (current) use of other medications   . Respiratory failure   . GERD (gastroesophageal reflux disease)   . Hypertrophy of prostate with urinary obstruction and other lower urinary tract symptoms (LUTS)   . Unspecified adverse effect of unspecified drug, medicinal and biological substance   . Unspecified hypothyroidism   . Family history of malignant neoplasm of gastrointestinal tract     History   Social History  . Marital Status: Married   Spouse Name: N/A    Number of Children: N/A  . Years of Education: N/A   Occupational History  . Not on file.   Social History Main Topics  . Smoking status: Never Smoker   . Smokeless tobacco: Not on file  . Alcohol Use: No  . Drug Use: No  . Sexually Active: Yes   Other Topics Concern  . Not on file   Social History Narrative  . No narrative on file    Past Surgical History  Procedure Date  . Tonsillectomy   . Knee surgery   . Coronary artery bypass graft     x 5 on August 29, 2008  . Tachecostomy placement     Family History  Problem Relation Age of Onset  . Coronary artery disease Other     positive  . Heart disease Mother   . Tuberculosis Father     Allergies  Allergen Reactions  . Losartan Potassium   . Naproxen   . Penicillins     Current Outpatient Prescriptions on File Prior to Visit  Medication Sig Dispense Refill  . aspirin 81 MG tablet Take 81 mg by mouth daily.        . B Complex-C-Folic Acid (FOLBEE PLUS) TABS TAKE 1 TABLET EVERY DAY  30 tablet  6  . carvedilol (COREG) 3.125 MG tablet Take 1 tablet (3.125 mg total) by mouth 2 (two) times daily with a meal.  180 tablet  3  . cimetidine (TAGAMET) 200 MG tablet Take 200 mg by mouth daily as needed.        Marland Kitchen levothyroxine (SYNTHROID, LEVOTHROID) 50 MCG tablet Take  1 tablet (50 mcg total) by mouth daily.  30 tablet  11  . lisinopril (PRINIVIL,ZESTRIL) 10 MG tablet Take 1 tablet (10 mg total) by mouth daily.  30 tablet  11  . rosuvastatin (CRESTOR) 5 MG tablet Take 5 mg by mouth daily.        . Tamsulosin HCl (FLOMAX) 0.4 MG CAPS Take 1 capsule (0.4 mg total) by mouth daily after supper.  30 capsule  11    BP 122/78  Pulse 68  Temp 98.2 F (36.8 C)  Resp 16  Ht 6\' 2"  (1.88 m)  Wt 190 lb (86.183 kg)  BMI 24.39 kg/m2       Objective:   Physical Exam  Constitutional: He appears well-developed and well-nourished.  HENT:  Head: Normocephalic and atraumatic.  Eyes: Conjunctivae are normal.  Pupils are equal, round, and reactive to light.  Neck: Normal range of motion. Neck supple.  Cardiovascular: Normal rate and regular rhythm.   Pulmonary/Chest: Effort normal and breath sounds normal.  Abdominal: Soft. Bowel sounds are normal.          Assessment & Plan:  The patient has a history of elevated potassium at borderline at 5.3.  He does eat a large occasionally he does not really have a high potassium diet we will give them an information sheet about high potassium containing foods so he can kind of modifying his diet as well as encourage him to drink 2 glasses of water in addition to the liquids and is having daily.  He will stay on his Crestor he will stay on the Prinivil he will stay on the Tagamet for his acid reflux and it is appropriate to take Maalox from time to time for breakthrough symptoms if he is requiring Maalox more than 3 times a week he needs to address that with [place] so that we can change the Tagamet to something stronger

## 2011-09-26 NOTE — Patient Instructions (Addendum)
The patient is instructed to continue all medications as prescribed. Schedule followup with check out clerk upon leaving the clinic Potassium foodPotassium Content of Foods Potassium is a mineral. The body needs potassium to control blood pressure and to keep the muscles and nervous system healthy. Most foods contain potassium. Eating a variety of foods in the right amounts will help control the level of potassium in your body. Kidney problems can cause there to be too much potassium in the body. If this happens, you may need to lower the amount of potassium in your diet. Some medicines such as diuretics may cause your body to lose too much potassium. If this happens, you may need to increase the amount of potassium in your diet. COMMON SERVING SIZES The list below tells you how big or small some common portion sizes are:  1 oz.........4 stacked dice.   3 oz........Marland KitchenDeck of cards.   1 tsp.......Marland KitchenTip of little finger.   1 tbs......Marland KitchenMarland KitchenThumb.   2 tbs.......Marland KitchenGolf ball.    cup......Marland KitchenHalf of a fist.   1 cup.......Marland KitchenA fist.  FOODS HIGH IN POTASSIUM More than 250 mg per serving.  Bran cereals and other bran products.   Milk (skim, 1%, 2%, whole).   Buttermilk.   Yogurt.   Avocados.   Bananas.   Dried fruits.   Kiwis.   Oranges.   Prunes.   Raisins.   Baked beans.   Spinach.   Tomatoes.   Peanut butter.   Nuts.   Tofu.   Potatoes.  FOODS MODERATE IN POTASSIUM Between 150 to 250mg  per serving.  Cherries.   Mangoes.   Asparagus.   Peas.   Zucchini.   Celery.   [place].   Peaches, fresh.   Broccoli stalks.   Peppers.   Figs.   Pears, fresh.   Kale.   Summer squashes.  FOODS LOW IN POTASSIUM Less than 150mg  per serving.  Pasta.   Rice.   Cottage cheese.   Cheddar cheese.   Apples.   Grapes.   Pineapple.   Raspberries.   Strawberries.   Watermelon.   Green beans.   Cabbage.   Cauliflower.   Corn.   Mushrooms.    Onions.   Eggs.  Document Released: 01/21/2005 Document Revised: 05/29/2011 Document Reviewed: 05/18/2007 [facility] Patient Information 2012 [place], [place].

## 2011-10-13 ENCOUNTER — Other Ambulatory Visit: Payer: Self-pay | Admitting: Internal Medicine

## 2011-10-21 DIAGNOSIS — H526 Other disorders of refraction: Secondary | ICD-10-CM | POA: Diagnosis not present

## 2011-10-21 DIAGNOSIS — H2510 Age-related nuclear cataract, unspecified eye: Secondary | ICD-10-CM | POA: Diagnosis not present

## 2011-10-24 DIAGNOSIS — H2510 Age-related nuclear cataract, unspecified eye: Secondary | ICD-10-CM | POA: Diagnosis not present

## 2011-10-24 DIAGNOSIS — Z01818 Encounter for other preprocedural examination: Secondary | ICD-10-CM | POA: Diagnosis not present

## 2011-10-30 DIAGNOSIS — Z79899 Other long term (current) drug therapy: Secondary | ICD-10-CM | POA: Diagnosis not present

## 2011-10-30 DIAGNOSIS — E039 Hypothyroidism, unspecified: Secondary | ICD-10-CM | POA: Diagnosis not present

## 2011-10-30 DIAGNOSIS — H2510 Age-related nuclear cataract, unspecified eye: Secondary | ICD-10-CM | POA: Diagnosis not present

## 2011-10-30 DIAGNOSIS — I252 Old myocardial infarction: Secondary | ICD-10-CM | POA: Diagnosis not present

## 2011-10-30 DIAGNOSIS — E7800 Pure hypercholesterolemia, unspecified: Secondary | ICD-10-CM | POA: Diagnosis not present

## 2011-10-30 DIAGNOSIS — I10 Essential (primary) hypertension: Secondary | ICD-10-CM | POA: Diagnosis not present

## 2011-10-30 DIAGNOSIS — K219 Gastro-esophageal reflux disease without esophagitis: Secondary | ICD-10-CM | POA: Diagnosis not present

## 2011-11-06 DIAGNOSIS — I10 Essential (primary) hypertension: Secondary | ICD-10-CM | POA: Diagnosis not present

## 2011-11-06 DIAGNOSIS — Z79899 Other long term (current) drug therapy: Secondary | ICD-10-CM | POA: Diagnosis not present

## 2011-11-06 DIAGNOSIS — E039 Hypothyroidism, unspecified: Secondary | ICD-10-CM | POA: Diagnosis not present

## 2011-11-06 DIAGNOSIS — H2510 Age-related nuclear cataract, unspecified eye: Secondary | ICD-10-CM | POA: Diagnosis not present

## 2011-11-06 DIAGNOSIS — E7800 Pure hypercholesterolemia, unspecified: Secondary | ICD-10-CM | POA: Diagnosis not present

## 2011-11-24 ENCOUNTER — Other Ambulatory Visit: Payer: Self-pay | Admitting: Internal Medicine

## 2011-12-16 ENCOUNTER — Other Ambulatory Visit (INDEPENDENT_AMBULATORY_CARE_PROVIDER_SITE_OTHER): Payer: Medicare Other

## 2011-12-16 DIAGNOSIS — N139 Obstructive and reflux uropathy, unspecified: Secondary | ICD-10-CM | POA: Diagnosis not present

## 2011-12-16 DIAGNOSIS — N401 Enlarged prostate with lower urinary tract symptoms: Secondary | ICD-10-CM

## 2011-12-16 DIAGNOSIS — E875 Hyperkalemia: Secondary | ICD-10-CM

## 2011-12-16 DIAGNOSIS — E785 Hyperlipidemia, unspecified: Secondary | ICD-10-CM | POA: Diagnosis not present

## 2011-12-16 DIAGNOSIS — T887XXA Unspecified adverse effect of drug or medicament, initial encounter: Secondary | ICD-10-CM

## 2011-12-16 LAB — BASIC METABOLIC PANEL
Calcium: 9.1 mg/dL (ref 8.4–10.5)
Creatinine, Ser: 1.2 mg/dL (ref 0.4–1.5)
GFR: 61.3 mL/min (ref >60.00)
Sodium: 140 mEq/L (ref 135–145)

## 2011-12-16 LAB — LIPID PANEL
HDL: 38 mg/dL — ABNORMAL LOW (ref >39.00)
LDL Cholesterol: 56 mg/dL (ref 0–99)
Total CHOL/HDL Ratio: 3
VLDL: 18 mg/dL (ref 0.0–40.0)

## 2011-12-16 LAB — HEPATIC FUNCTION PANEL
Alkaline Phosphatase: 47 U/L (ref 39–117)
Bilirubin, Direct: 0.1 mg/dL (ref 0.0–0.3)
Total Bilirubin: 0.7 mg/dL (ref 0.3–1.2)

## 2011-12-16 LAB — PSA: PSA: 3.17 ng/mL (ref 0.10–4.00)

## 2011-12-23 ENCOUNTER — Ambulatory Visit (INDEPENDENT_AMBULATORY_CARE_PROVIDER_SITE_OTHER): Payer: Medicare Other | Admitting: Internal Medicine

## 2011-12-23 ENCOUNTER — Encounter: Payer: Self-pay | Admitting: Internal Medicine

## 2011-12-23 VITALS — BP 130/78 | HR 68 | Temp 98.6°F | Resp 16 | Ht 74.0 in | Wt 192.0 lb

## 2011-12-23 DIAGNOSIS — F411 Generalized anxiety disorder: Secondary | ICD-10-CM

## 2011-12-23 DIAGNOSIS — M542 Cervicalgia: Secondary | ICD-10-CM | POA: Diagnosis not present

## 2011-12-23 DIAGNOSIS — F419 Anxiety disorder, unspecified: Secondary | ICD-10-CM

## 2011-12-23 DIAGNOSIS — I10 Essential (primary) hypertension: Secondary | ICD-10-CM | POA: Diagnosis not present

## 2011-12-23 DIAGNOSIS — E785 Hyperlipidemia, unspecified: Secondary | ICD-10-CM | POA: Diagnosis not present

## 2011-12-23 MED ORDER — DIAZEPAM 5 MG PO TABS: 5.0000 mg | ORAL_TABLET | Freq: Three times a day (TID) | ORAL | Status: AC | PRN

## 2011-12-23 NOTE — Patient Instructions (Signed)
You can take the diazepam that to 3 times a day for anxiety and/or neck tension.  I have given you a prescription to carry to your pharmacy.  You can get a Gatorade product called "G2 "that she can drink all the golf course

## 2011-12-23 NOTE — Progress Notes (Signed)
Subjective:    Patient ID: Zachary Decker, male    DOB: [birth year], 78 y.o.   MRN: [id number]  HPI  Patient is a 78 year old male who is seen in the office for followup of his cholesterol medications.  He had a lipid and liver drawn prior to visit and he is at goal.  He is also presents for followup of his hypertension with known history of cardiovascular disease he has a stable blood pressure.  Patient's thyroid is also stable on his current medications.  The patient has a chief complaint today of some symptoms that he perceives may be anxiety related he states that when he was left on the table for a recent study he developed stiffness and pain in his neck that he associates with tension.  He also reports that when he plays golf in the heat but towards the end of playing golf he feels that his blood pressure drops.    Review of Systems  Constitutional: Negative for fever and fatigue.  HENT: Negative for hearing loss, congestion, neck pain and postnasal drip.   Eyes: Negative for discharge, redness and visual disturbance.  Respiratory: Negative for cough, shortness of breath and wheezing.   Cardiovascular: Negative for leg swelling.  Gastrointestinal: Negative for abdominal pain, constipation and abdominal distention.  Genitourinary: Negative for urgency and frequency.  Musculoskeletal: Negative for joint swelling and arthralgias.  Skin: Negative for color change and rash.  Neurological: Negative for weakness and light-headedness.  Hematological: Negative for adenopathy.  Psychiatric/Behavioral: Negative for behavioral problems.   Past Medical History  Diagnosis Date  . CAD (coronary artery disease)   . Acute myocardial infarction of inferoposterior wall, subsequent episode of care   . Hypertension   . Hypercholesterolemia   . PVD (peripheral vascular disease)   . Personal history of thrombophlebitis   . Encounter for long-term (current) use of other medications   .  Respiratory failure   . GERD (gastroesophageal reflux disease)   . Hypertrophy of prostate with urinary obstruction and other lower urinary tract symptoms (LUTS)   . Unspecified adverse effect of unspecified drug, medicinal and biological substance   . Unspecified hypothyroidism   . Family history of malignant neoplasm of gastrointestinal tract     History   Social History  . Marital Status: Married    Spouse Name: N/A    Number of Children: N/A  . Years of Education: N/A   Occupational History  . Not on file.   Social History Main Topics  . Smoking status: Never Smoker   . Smokeless tobacco: Not on file  . Alcohol Use: No  . Drug Use: No  . Sexually Active: Yes   Other Topics Concern  . Not on file   Social History Narrative  . No narrative on file    Past Surgical History  Procedure Date  . Tonsillectomy   . Knee surgery   . Coronary artery bypass graft     x 5 on August 29, 2008  . Tachecostomy placement     Family History  Problem Relation Age of Onset  . Coronary artery disease Other     positive  . Heart disease Mother   . Tuberculosis Father     Allergies  Allergen Reactions  . Losartan Potassium   . Naproxen   . Penicillins     Current Outpatient Prescriptions on File Prior to Visit  Medication Sig Dispense Refill  . aspirin 81 MG tablet Take 81 mg by mouth daily.        Marland Kitchen  B Complex-C-Folic Acid (FOLBEE PLUS) TABS TAKE 1 TABLET EVERY DAY  30 tablet  6  . B-Complex-C-Biotin-Minerals-FA (FOLBEE PLUS CZ) 5 MG TABS TAKE 1 TABLET EVERY DAY  30 tablet  3  . carvedilol (COREG) 3.125 MG tablet Take 1 tablet (3.125 mg total) by mouth 2 (two) times daily with a meal.  180 tablet  3  . levothyroxine (SYNTHROID, LEVOTHROID) 50 MCG tablet Take 1 tablet (50 mcg total) by mouth daily.  30 tablet  11  . lisinopril (PRINIVIL,ZESTRIL) 10 MG tablet Take 1 tablet (10 mg total) by mouth daily.  30 tablet  11  . ranitidine (ZANTAC) 300 MG tablet Take 1 tablet (300 mg  total) by mouth at bedtime.  30 tablet  11  . rosuvastatin (CRESTOR) 5 MG tablet Take 5 mg by mouth daily.        . Tamsulosin HCl (FLOMAX) 0.4 MG CAPS Take 1 capsule (0.4 mg total) by mouth daily after supper.  30 capsule  11    BP 130/78  Pulse 68  Temp 98.6 F (37 C)  Resp 16  Ht 6\' 2"  (1.88 m)  Wt 192 lb (87.091 kg)  BMI 24.65 kg/m2       Objective:   Physical Exam  Nursing note and vitals reviewed. Constitutional: He appears well-developed and well-nourished.  HENT:  Head: Normocephalic and atraumatic.  Eyes: Conjunctivae are normal. Pupils are equal, round, and reactive to light.  Neck: Normal range of motion. Neck supple.  Cardiovascular: Normal rate and regular rhythm.   Pulmonary/Chest: Effort normal and breath sounds normal.  Abdominal: Soft. Bowel sounds are normal.          Assessment & Plan:  For the patient's anxiety and neck spasms I believe that we can treat both effectively with low-dose diazepam.  We will give him 5 mg tablets to use up to 3 times a day when necessary anxiety and/or neck spasms.  When the patient is coughing and heat he is instructed to get Gatorade and take with him and drink a quart of Gatorade while on the golf course.  The patient's lipids liver TSH were all at goal for therapy

## 2011-12-29 ENCOUNTER — Other Ambulatory Visit: Payer: Self-pay | Admitting: *Deleted

## 2011-12-29 DIAGNOSIS — K219 Gastro-esophageal reflux disease without esophagitis: Secondary | ICD-10-CM

## 2011-12-29 DIAGNOSIS — T887XXA Unspecified adverse effect of drug or medicament, initial encounter: Secondary | ICD-10-CM

## 2011-12-29 DIAGNOSIS — E875 Hyperkalemia: Secondary | ICD-10-CM

## 2011-12-29 DIAGNOSIS — E039 Hypothyroidism, unspecified: Secondary | ICD-10-CM

## 2011-12-29 DIAGNOSIS — E785 Hyperlipidemia, unspecified: Secondary | ICD-10-CM

## 2011-12-29 MED ORDER — LEVOTHYROXINE SODIUM 50 MCG PO TABS: 50.0000 ug | ORAL_TABLET | Freq: Every day | ORAL | Status: DC

## 2011-12-29 MED ORDER — RANITIDINE HCL 300 MG PO TABS: 300.0000 mg | ORAL_TABLET | Freq: Every day | ORAL | Status: DC

## 2011-12-29 MED ORDER — TAMSULOSIN HCL 0.4 MG PO CAPS: 0.4000 mg | ORAL_CAPSULE | ORAL | Status: DC

## 2011-12-29 MED ORDER — LISINOPRIL 10 MG PO TABS: 10.0000 mg | ORAL_TABLET | Freq: Every day | ORAL | Status: DC

## 2011-12-29 MED ORDER — FOLBEE PLUS CZ 5 MG PO TABS: 1.0000 | ORAL_TABLET | Freq: Every day | ORAL | Status: DC

## 2012-01-07 ENCOUNTER — Other Ambulatory Visit: Payer: Self-pay | Admitting: *Deleted

## 2012-01-07 DIAGNOSIS — R972 Elevated prostate specific antigen [PSA]: Secondary | ICD-10-CM

## 2012-01-07 MED ORDER — CIPROFLOXACIN HCL 500 MG PO TABS: 500.0000 mg | ORAL_TABLET | Freq: Two times a day (BID) | ORAL | Status: AC

## 2012-01-12 ENCOUNTER — Other Ambulatory Visit: Payer: Self-pay | Admitting: Internal Medicine

## 2012-01-24 ENCOUNTER — Other Ambulatory Visit: Payer: Self-pay | Admitting: Internal Medicine

## 2012-01-27 ENCOUNTER — Telehealth: Payer: Self-pay | Admitting: Internal Medicine

## 2012-01-27 ENCOUNTER — Other Ambulatory Visit: Payer: Self-pay | Admitting: Internal Medicine

## 2012-01-27 NOTE — Telephone Encounter (Signed)
Pt called re: PSA lvls. Req to speak to [facility].

## 2012-01-27 NOTE — Telephone Encounter (Signed)
Requesting psa level repeat- pt going to [facility]

## 2012-01-29 ENCOUNTER — Other Ambulatory Visit: Payer: Medicare Other

## 2012-01-29 DIAGNOSIS — R972 Elevated prostate specific antigen [PSA]: Secondary | ICD-10-CM

## 2012-01-29 LAB — PSA: PSA: 2.55 ng/mL (ref 0.10–4.00)

## 2012-02-12 ENCOUNTER — Other Ambulatory Visit: Payer: Self-pay | Admitting: Internal Medicine

## 2012-02-24 ENCOUNTER — Observation Stay (HOSPITAL_COMMUNITY): Payer: Medicare Other

## 2012-02-24 ENCOUNTER — Encounter (HOSPITAL_COMMUNITY): Payer: Self-pay | Admitting: Emergency Medicine

## 2012-02-24 ENCOUNTER — Emergency Department (HOSPITAL_COMMUNITY): Payer: Medicare Other

## 2012-02-24 ENCOUNTER — Other Ambulatory Visit: Payer: Self-pay

## 2012-02-24 ENCOUNTER — Inpatient Hospital Stay (HOSPITAL_COMMUNITY)
Admission: EM | Admit: 2012-02-24 | Discharge: 2012-02-29 | DRG: 418 | Disposition: A | Discharge status: Home / Self Care | Payer: Medicare Other | Attending: Internal Medicine | Admitting: Internal Medicine

## 2012-02-24 DIAGNOSIS — K8000 Calculus of gallbladder with acute cholecystitis without obstruction: Principal | ICD-10-CM | POA: Diagnosis present

## 2012-02-24 DIAGNOSIS — Z8249 Family history of ischemic heart disease and other diseases of the circulatory system: Secondary | ICD-10-CM

## 2012-02-24 DIAGNOSIS — E785 Hyperlipidemia, unspecified: Secondary | ICD-10-CM | POA: Diagnosis not present

## 2012-02-24 DIAGNOSIS — I10 Essential (primary) hypertension: Secondary | ICD-10-CM

## 2012-02-24 DIAGNOSIS — Z7982 Long term (current) use of aspirin: Secondary | ICD-10-CM

## 2012-02-24 DIAGNOSIS — R079 Chest pain, unspecified: Secondary | ICD-10-CM | POA: Diagnosis not present

## 2012-02-24 DIAGNOSIS — Z880 Allergy status to penicillin: Secondary | ICD-10-CM | POA: Diagnosis not present

## 2012-02-24 DIAGNOSIS — N138 Other obstructive and reflux uropathy: Secondary | ICD-10-CM

## 2012-02-24 DIAGNOSIS — Z79899 Other long term (current) drug therapy: Secondary | ICD-10-CM

## 2012-02-24 DIAGNOSIS — Z8672 Personal history of thrombophlebitis: Secondary | ICD-10-CM

## 2012-02-24 DIAGNOSIS — I25810 Atherosclerosis of coronary artery bypass graft(s) without angina pectoris: Secondary | ICD-10-CM

## 2012-02-24 DIAGNOSIS — I2510 Atherosclerotic heart disease of native coronary artery without angina pectoris: Secondary | ICD-10-CM | POA: Diagnosis not present

## 2012-02-24 DIAGNOSIS — I252 Old myocardial infarction: Secondary | ICD-10-CM

## 2012-02-24 DIAGNOSIS — D518 Other vitamin B12 deficiency anemias: Secondary | ICD-10-CM

## 2012-02-24 DIAGNOSIS — J9600 Acute respiratory failure, unspecified whether with hypoxia or hypercapnia: Secondary | ICD-10-CM

## 2012-02-24 DIAGNOSIS — E875 Hyperkalemia: Secondary | ICD-10-CM | POA: Diagnosis not present

## 2012-02-24 DIAGNOSIS — Z951 Presence of aortocoronary bypass graft: Secondary | ICD-10-CM

## 2012-02-24 DIAGNOSIS — K5730 Diverticulosis of large intestine without perforation or abscess without bleeding: Secondary | ICD-10-CM | POA: Diagnosis not present

## 2012-02-24 DIAGNOSIS — R1110 Vomiting, unspecified: Secondary | ICD-10-CM | POA: Diagnosis not present

## 2012-02-24 DIAGNOSIS — D62 Acute posthemorrhagic anemia: Secondary | ICD-10-CM | POA: Diagnosis not present

## 2012-02-24 DIAGNOSIS — K8066 Calculus of gallbladder and bile duct with acute and chronic cholecystitis without obstruction: Secondary | ICD-10-CM | POA: Diagnosis not present

## 2012-02-24 DIAGNOSIS — L570 Actinic keratosis: Secondary | ICD-10-CM

## 2012-02-24 DIAGNOSIS — K469 Unspecified abdominal hernia without obstruction or gangrene: Secondary | ICD-10-CM | POA: Diagnosis not present

## 2012-02-24 DIAGNOSIS — E7800 Pure hypercholesterolemia, unspecified: Secondary | ICD-10-CM

## 2012-02-24 DIAGNOSIS — K8020 Calculus of gallbladder without cholecystitis without obstruction: Secondary | ICD-10-CM | POA: Diagnosis not present

## 2012-02-24 DIAGNOSIS — E039 Hypothyroidism, unspecified: Secondary | ICD-10-CM | POA: Diagnosis present

## 2012-02-24 DIAGNOSIS — K828 Other specified diseases of gallbladder: Secondary | ICD-10-CM | POA: Diagnosis not present

## 2012-02-24 DIAGNOSIS — C44300 Unspecified malignant neoplasm of skin of unspecified part of face: Secondary | ICD-10-CM

## 2012-02-24 DIAGNOSIS — M1710 Unilateral primary osteoarthritis, unspecified knee: Secondary | ICD-10-CM

## 2012-02-24 DIAGNOSIS — R109 Unspecified abdominal pain: Secondary | ICD-10-CM

## 2012-02-24 DIAGNOSIS — Z888 Allergy status to other drugs, medicaments and biological substances status: Secondary | ICD-10-CM | POA: Diagnosis not present

## 2012-02-24 DIAGNOSIS — K5732 Diverticulitis of large intestine without perforation or abscess without bleeding: Secondary | ICD-10-CM | POA: Diagnosis present

## 2012-02-24 DIAGNOSIS — T887XXA Unspecified adverse effect of drug or medicament, initial encounter: Secondary | ICD-10-CM

## 2012-02-24 DIAGNOSIS — N179 Acute kidney failure, unspecified: Secondary | ICD-10-CM | POA: Diagnosis not present

## 2012-02-24 DIAGNOSIS — R338 Other retention of urine: Secondary | ICD-10-CM | POA: Diagnosis not present

## 2012-02-24 DIAGNOSIS — A0472 Enterocolitis due to Clostridium difficile, not specified as recurrent: Secondary | ICD-10-CM | POA: Diagnosis not present

## 2012-02-24 DIAGNOSIS — R1012 Left upper quadrant pain: Secondary | ICD-10-CM | POA: Diagnosis not present

## 2012-02-24 DIAGNOSIS — D72829 Elevated white blood cell count, unspecified: Secondary | ICD-10-CM

## 2012-02-24 DIAGNOSIS — R1011 Right upper quadrant pain: Secondary | ICD-10-CM | POA: Diagnosis not present

## 2012-02-24 DIAGNOSIS — K219 Gastro-esophageal reflux disease without esophagitis: Secondary | ICD-10-CM | POA: Diagnosis not present

## 2012-02-24 DIAGNOSIS — K8010 Calculus of gallbladder with chronic cholecystitis without obstruction: Secondary | ICD-10-CM | POA: Diagnosis not present

## 2012-02-24 DIAGNOSIS — J984 Other disorders of lung: Secondary | ICD-10-CM | POA: Diagnosis not present

## 2012-02-24 DIAGNOSIS — I2589 Other forms of chronic ischemic heart disease: Secondary | ICD-10-CM

## 2012-02-24 DIAGNOSIS — K810 Acute cholecystitis: Secondary | ICD-10-CM | POA: Diagnosis not present

## 2012-02-24 DIAGNOSIS — R498 Other voice and resonance disorders: Secondary | ICD-10-CM

## 2012-02-24 DIAGNOSIS — I2119 ST elevation (STEMI) myocardial infarction involving other coronary artery of inferior wall: Secondary | ICD-10-CM

## 2012-02-24 DIAGNOSIS — N401 Enlarged prostate with lower urinary tract symptoms: Secondary | ICD-10-CM

## 2012-02-24 DIAGNOSIS — R339 Retention of urine, unspecified: Secondary | ICD-10-CM

## 2012-02-24 DIAGNOSIS — I739 Peripheral vascular disease, unspecified: Secondary | ICD-10-CM

## 2012-02-24 LAB — CBC
HCT: 40.1 % (ref 39.0–52.0)
MCHC: 34.4 g/dL (ref 30.0–36.0)
MCV: 96.6 fL (ref 78.0–100.0)
RDW: 11.8 % (ref 11.5–15.5)

## 2012-02-24 LAB — CBC WITH DIFFERENTIAL/PLATELET
Basophils Absolute: 0 10*3/uL (ref 0.0–0.1)
Eosinophils Absolute: 0.4 10*3/uL (ref 0.0–0.7)
Lymphs Abs: 3 10*3/uL (ref 0.7–4.0)
MCH: 33.9 pg (ref 26.0–34.0)
Neutrophils Relative %: 69 % (ref 43–77)
Platelets: 249 10*3/uL (ref 150–400)
RBC: 4.43 MIL/uL (ref 4.22–5.81)
WBC: 16.4 10*3/uL — ABNORMAL HIGH (ref 4.0–10.5)

## 2012-02-24 LAB — HEPATIC FUNCTION PANEL
ALT: 18 U/L (ref 0–53)
AST: 27 U/L (ref 0–37)
Albumin: 3.8 g/dL (ref 3.5–5.2)
Alkaline Phosphatase: 59 U/L (ref 39–117)
Total Protein: 7 g/dL (ref 6.0–8.3)

## 2012-02-24 LAB — TROPONIN I
Troponin I: <0.3 ng/mL (ref <0.30)
Troponin I: <0.3 ng/mL (ref <0.30)
Troponin I: <0.3 ng/mL (ref <0.30)

## 2012-02-24 LAB — BASIC METABOLIC PANEL
GFR calc Af Amer: 66 mL/min — ABNORMAL LOW (ref >90)
GFR calc non Af Amer: 57 mL/min — ABNORMAL LOW (ref >90)
Glucose, Bld: 121 mg/dL — ABNORMAL HIGH (ref 70–99)
Potassium: 5.3 mEq/L — ABNORMAL HIGH (ref 3.5–5.1)
Sodium: 134 mEq/L — ABNORMAL LOW (ref 135–145)

## 2012-02-24 LAB — URINALYSIS, ROUTINE W REFLEX MICROSCOPIC
Leukocytes, UA: NEGATIVE
Nitrite: NEGATIVE
Protein, ur: NEGATIVE mg/dL
Urobilinogen, UA: 0.2 mg/dL (ref 0.0–1.0)

## 2012-02-24 LAB — POCT I-STAT TROPONIN I: Troponin i, poc: 0.01 ng/mL (ref 0.00–0.08)

## 2012-02-24 LAB — LIPASE, BLOOD: Lipase: 34 U/L (ref 11–59)

## 2012-02-24 MED ORDER — HEPARIN SODIUM (PORCINE) 5000 UNIT/ML IJ SOLN
5000.0000 [IU] | Freq: Three times a day (TID) | INTRAMUSCULAR | Status: DC
  Administered 2012-02-24 – 2012-02-29 (×14): 5000 [IU] via SUBCUTANEOUS
  Filled 2012-02-24 (×19): qty 1

## 2012-02-24 MED ORDER — CIPROFLOXACIN IN D5W 400 MG/200ML IV SOLN
400.0000 mg | Freq: Two times a day (BID) | INTRAVENOUS | Status: DC
  Administered 2012-02-24 – 2012-02-27 (×8): 400 mg via INTRAVENOUS
  Filled 2012-02-24 (×13): qty 200

## 2012-02-24 MED ORDER — FAMOTIDINE 20 MG PO TABS
20.0000 mg | ORAL_TABLET | Freq: Two times a day (BID) | ORAL | Status: DC
  Administered 2012-02-24 – 2012-02-29 (×11): 20 mg via ORAL
  Filled 2012-02-24 (×12): qty 1

## 2012-02-24 MED ORDER — IOHEXOL 300 MG/ML  SOLN
20.0000 mL | INTRAMUSCULAR | Status: AC
  Administered 2012-02-24: 20 mL via ORAL

## 2012-02-24 MED ORDER — IOHEXOL 300 MG/ML  SOLN
100.0000 mL | Freq: Once | INTRAMUSCULAR | Status: AC | PRN
  Administered 2012-02-24: 100 mL via INTRAVENOUS

## 2012-02-24 MED ORDER — ONDANSETRON HCL 4 MG PO TABS: 4.0000 mg | ORAL_TABLET | Freq: Four times a day (QID) | ORAL | Status: DC | PRN

## 2012-02-24 MED ORDER — LEVOTHYROXINE SODIUM 50 MCG PO TABS
50.0000 ug | ORAL_TABLET | Freq: Every day | ORAL | Status: DC
  Administered 2012-02-24 – 2012-02-29 (×6): 50 ug via ORAL
  Filled 2012-02-24 (×7): qty 1

## 2012-02-24 MED ORDER — ASPIRIN 81 MG PO TABS: 81.0000 mg | ORAL_TABLET | Freq: Every day | ORAL | Status: DC

## 2012-02-24 MED ORDER — ATORVASTATIN CALCIUM 10 MG PO TABS
10.0000 mg | ORAL_TABLET | Freq: Every day | ORAL | Status: DC
  Administered 2012-02-24 – 2012-02-28 (×5): 10 mg via ORAL
  Filled 2012-02-24 (×6): qty 1

## 2012-02-24 MED ORDER — DEXTROSE-NACL 5-0.9 % IV SOLN
INTRAVENOUS | Status: DC
  Administered 2012-02-24: 50 mL via INTRAVENOUS
  Administered 2012-02-25: 100 mL via INTRAVENOUS
  Administered 2012-02-27: 06:00:00 via INTRAVENOUS

## 2012-02-24 MED ORDER — FAMOTIDINE IN NACL 20-0.9 MG/50ML-% IV SOLN
20.0000 mg | Freq: Once | INTRAVENOUS | Status: AC
  Administered 2012-02-24: 20 mg via INTRAVENOUS
  Filled 2012-02-24: qty 50

## 2012-02-24 MED ORDER — ONDANSETRON HCL 4 MG/2ML IJ SOLN
4.0000 mg | Freq: Four times a day (QID) | INTRAMUSCULAR | Status: DC | PRN
  Administered 2012-02-24 (×2): 4 mg via INTRAVENOUS
  Filled 2012-02-24 (×2): qty 2

## 2012-02-24 MED ORDER — HYDROMORPHONE HCL PF 1 MG/ML IJ SOLN: 0.5000 mg | INTRAMUSCULAR | Status: DC | PRN

## 2012-02-24 MED ORDER — CARVEDILOL 3.125 MG PO TABS
3.1250 mg | ORAL_TABLET | Freq: Two times a day (BID) | ORAL | Status: DC
  Administered 2012-02-24 – 2012-02-29 (×9): 3.125 mg via ORAL
  Filled 2012-02-24 (×13): qty 1

## 2012-02-24 MED ORDER — DIAZEPAM 5 MG PO TABS: 5.0000 mg | ORAL_TABLET | Freq: Three times a day (TID) | ORAL | Status: DC | PRN

## 2012-02-24 MED ORDER — METRONIDAZOLE IN NACL 5-0.79 MG/ML-% IV SOLN
500.0000 mg | Freq: Three times a day (TID) | INTRAVENOUS | Status: DC
Start: 2012-02-24 — End: 2012-02-28
  Administered 2012-02-24 – 2012-02-28 (×12): 500 mg via INTRAVENOUS
  Filled 2012-02-24 (×14): qty 100

## 2012-02-24 MED ORDER — HYDROMORPHONE HCL PF 1 MG/ML IJ SOLN
1.0000 mg | Freq: Once | INTRAMUSCULAR | Status: AC
  Administered 2012-02-24: 1 mg via INTRAVENOUS
  Filled 2012-02-24: qty 1

## 2012-02-24 MED ORDER — TAMSULOSIN HCL 0.4 MG PO CAPS
0.4000 mg | ORAL_CAPSULE | Freq: Every day | ORAL | Status: DC
  Administered 2012-02-24 – 2012-02-28 (×5): 0.4 mg via ORAL
  Filled 2012-02-24 (×6): qty 1

## 2012-02-24 MED ORDER — ONDANSETRON HCL 4 MG/2ML IJ SOLN
4.0000 mg | Freq: Once | INTRAMUSCULAR | Status: AC
  Administered 2012-02-24: 4 mg via INTRAVENOUS
  Filled 2012-02-24: qty 2

## 2012-02-24 MED ORDER — HYDROMORPHONE HCL PF 1 MG/ML IJ SOLN
0.5000 mg | Freq: Once | INTRAMUSCULAR | Status: AC
  Administered 2012-02-24: 0.5 mg via INTRAVENOUS
  Filled 2012-02-24: qty 1

## 2012-02-24 MED ORDER — DOCUSATE SODIUM 100 MG PO CAPS
100.0000 mg | ORAL_CAPSULE | Freq: Two times a day (BID) | ORAL | Status: DC
  Administered 2012-02-24 – 2012-02-28 (×6): 100 mg via ORAL
  Filled 2012-02-24 (×13): qty 1

## 2012-02-24 MED ORDER — HYDROMORPHONE HCL PF 1 MG/ML IJ SOLN
1.0000 mg | INTRAMUSCULAR | Status: DC | PRN
  Administered 2012-02-24: 1 mg via INTRAVENOUS
  Filled 2012-02-24: qty 1

## 2012-02-24 MED ORDER — SODIUM CHLORIDE 0.9 % IV BOLUS (SEPSIS)
1000.0000 mL | Freq: Once | INTRAVENOUS | Status: AC
  Administered 2012-02-24: 1000 mL via INTRAVENOUS

## 2012-02-24 MED ORDER — GI COCKTAIL ~~LOC~~
30.0000 mL | Freq: Once | ORAL | Status: AC
  Administered 2012-02-24: 30 mL via ORAL
  Filled 2012-02-24: qty 30

## 2012-02-24 MED ORDER — ASPIRIN EC 81 MG PO TBEC
81.0000 mg | DELAYED_RELEASE_TABLET | Freq: Every day | ORAL | Status: DC
  Administered 2012-02-24 – 2012-02-29 (×6): 81 mg via ORAL
  Filled 2012-02-24 (×6): qty 1

## 2012-02-24 MED ORDER — LISINOPRIL 10 MG PO TABS
10.0000 mg | ORAL_TABLET | Freq: Every day | ORAL | Status: DC
  Administered 2012-02-24: 10 mg via ORAL
  Filled 2012-02-24 (×2): qty 1

## 2012-02-24 NOTE — Consult Note (Signed)
Reason for Consult:Adominal pain  Referring Physician:  Caymen Dubray is an 78 y.o. male.  HPI: 78 y.o. male with hx of CAD s/p prior MI and CABG, PVD, HTN, Hypercholesterolemia, GERD, presents to the ER with substernal chest burning for a few days. He said he took the Nexium and it was helpful. He denied any SOB, pleuritic pain nor any radiation of his pain. He took some Peptobismol, but it wasn't helpful. He therefore presents to the ER. In the ER, his chest pain resolved completely, but he started complaining of LLQ pain. Evaluation in the ER showed unremarkable EKG, negative troponin, but he has mild leukocytosis. He subsequently had a abd/pelvic CT to exclude diverticulitis. It showed diverticulosis without diverticulitis, and large stone with pericholic edema suggestive of acute cholecystitis. He has no RUQ pain, and his symptoms are definitely in the LLQ area. He has had no fever, chills, cough or any other symptomology. Hospitalist was asked to admit patient for further evaluation of his abdominal pain and to do cardiac r/out. He had flexible sigmoidoscopy over five years ago, according to him, in the PCP's office and at that time, he was that he had "pockets".  We are asked to see the patient on consult based on the findings of both his CT and US of the abdomen which indicate cholithiasis with possible cholecystitis. At present he is asymptomatic, and was eating when I went to examine him. He also stated that he had not had any previous episodes of like symptoms.  CT of the ABDOMEN AND PELVIS : IMPRESSION:  Descending and sigmoid colonic diverticulosis without evidence of  diverticulitis.  Cholelithiasis with question minimal pericholecystic edema; if  there is clinical concern for acute cholecystitis, recommend  abdominal ultrasound.  Herniation of a small portion of the anterior right middle lobe  through a chest wall defect at the anterior lower chest.  Tiny bladder  diverticulum.  No other intra abdominal or intrapelvic abnormalities identified.  Original Report Authenticated By: Lollie Marrow, M.D.   US OF THE ABDOMEN: Findings:  Gallbladder: Multiple gallstones. The largest is 2.3 cm.  Significant gallbladder wall thickening measures up to 7 mm. The  gallbladder wall is edematous. No pericholecystic fluid. No  Murphy's sign. Sludge is noted.  Common Bile Duct: Within normal limits in caliber.     Past Medical History  Diagnosis Date  . CAD (coronary artery disease)   . Acute myocardial infarction of inferoposterior wall, subsequent episode of care   . Hypertension   . Hypercholesterolemia   . PVD (peripheral vascular disease)   . Personal history of thrombophlebitis   . Encounter for long-term (current) use of other medications   . Respiratory failure   . GERD (gastroesophageal reflux disease)   . Hypertrophy of prostate with urinary obstruction and other lower urinary tract symptoms (LUTS)   . Unspecified adverse effect of unspecified drug, medicinal and biological substance   . Unspecified hypothyroidism   . Family history of malignant neoplasm of gastrointestinal tract     Past Surgical History  Procedure Date  . Tonsillectomy   . Knee surgery   . Coronary artery bypass graft     x 5 on August 29, 2008  . Tachecostomy placement     Family History  Problem Relation Age of Onset  . Coronary artery disease Other     positive  . Heart disease Mother   . Tuberculosis Father     Social History:  reports that he has  never smoked. He does not have any smokeless tobacco history on file. He reports that he does not drink alcohol or use illicit drugs.  Allergies:  Allergies  Allergen Reactions  . Losartan Potassium   . Naproxen   . Penicillins     Medications: I have reviewed the patient's current medications.  Results for orders placed during the hospital encounter of 02/24/12 (from the past 48 hour(s))  CBC WITH  DIFFERENTIAL     Status: Abnormal   Collection Time   02/24/12 12:29 AM      Component Value Range Comment   WBC 16.4 (*) 4.0 - 10.5 K/uL    RBC 4.43  4.22 - 5.81 MIL/uL    Hemoglobin 15.0  13.0 - 17.0 g/dL    HCT 54.0  98.1 - 19.1 %    MCV 95.9  78.0 - 100.0 fL    MCH 33.9  26.0 - 34.0 pg    MCHC 35.3  30.0 - 36.0 g/dL    RDW 47.8  29.5 - 62.1 %    Platelets 249  150 - 400 K/uL    Neutrophils Relative 69  43 - 77 %    Neutro Abs 11.2 (*) 1.7 - 7.7 K/uL    Lymphocytes Relative 18  12 - 46 %    Lymphs Abs 3.0  0.7 - 4.0 K/uL    Monocytes Relative 11  3 - 12 %    Monocytes Absolute 1.8 (*) 0.1 - 1.0 K/uL    Eosinophils Relative 2  0 - 5 %    Eosinophils Absolute 0.4  0.0 - 0.7 K/uL    Basophils Relative 0  0 - 1 %    Basophils Absolute 0.0  0.0 - 0.1 K/uL   BASIC METABOLIC PANEL     Status: Abnormal   Collection Time   02/24/12 12:29 AM      Component Value Range Comment   Sodium 134 (*) 135 - 145 mEq/L    Potassium 5.3 (*) 3.5 - 5.1 mEq/L    Chloride 99  96 - 112 mEq/L    CO2 27  19 - 32 mEq/L    Glucose, Bld 121 (*) 70 - 99 mg/dL    BUN 23  6 - 23 mg/dL    Creatinine, Ser 3.08  0.50 - 1.35 mg/dL    Calcium 65.7  8.4 - 10.5 mg/dL    GFR calc non Af Amer 57 (*) >90 mL/min    GFR calc Af Amer 66 (*) >90 mL/min   LIPASE, BLOOD     Status: Normal   Collection Time   02/24/12 12:29 AM      Component Value Range Comment   Lipase 34  11 - 59 U/L   POCT I-STAT TROPONIN I     Status: Normal   Collection Time   02/24/12 12:44 AM      Component Value Range Comment   Troponin i, poc 0.01  0.00 - 0.08 ng/mL    Comment 3            URINALYSIS, ROUTINE W REFLEX MICROSCOPIC     Status: Abnormal   Collection Time   02/24/12  1:33 AM      Component Value Range Comment   Color, Urine YELLOW  YELLOW    APPearance CLOUDY (*) CLEAR    Specific Gravity, Urine 1.015  1.005 - 1.030    pH 5.5  5.0 - 8.0    Glucose, UA NEGATIVE  NEGATIVE mg/dL  Hgb urine dipstick NEGATIVE  NEGATIVE    Bilirubin  Urine NEGATIVE  NEGATIVE    Ketones, ur NEGATIVE  NEGATIVE mg/dL    Protein, ur NEGATIVE  NEGATIVE mg/dL    Urobilinogen, UA 0.2  0.0 - 1.0 mg/dL    Nitrite NEGATIVE  NEGATIVE    Leukocytes, UA NEGATIVE  NEGATIVE MICROSCOPIC NOT DONE ON URINES WITH NEGATIVE PROTEIN, BLOOD, LEUKOCYTES, NITRITE, OR GLUCOSE <1000 mg/dL.  TROPONIN I     Status: Normal   Collection Time   02/24/12  5:07 AM      Component Value Range Comment   Troponin I <0.30  <0.30 ng/mL   HEPATIC FUNCTION PANEL     Status: Normal   Collection Time   02/24/12  5:07 AM      Component Value Range Comment   Total Protein 7.0  6.0 - 8.3 g/dL    Albumin 3.8  3.5 - 5.2 g/dL    AST 27  0 - 37 U/L    ALT 18  0 - 53 U/L    Alkaline Phosphatase 59  39 - 117 U/L    Total Bilirubin 0.4  0.3 - 1.2 mg/dL    Bilirubin, Direct 0.1  0.0 - 0.3 mg/dL    Indirect Bilirubin 0.3  0.3 - 0.9 mg/dL   TROPONIN I     Status: Normal   Collection Time   02/24/12  9:32 AM      Component Value Range Comment   Troponin I <0.30  <0.30 ng/mL   CBC     Status: Abnormal   Collection Time   02/24/12  9:33 AM      Component Value Range Comment   WBC 17.9 (*) 4.0 - 10.5 K/uL    RBC 4.15 (*) 4.22 - 5.81 MIL/uL    Hemoglobin 13.8  13.0 - 17.0 g/dL    HCT 16.1  09.6 - 04.5 %    MCV 96.6  78.0 - 100.0 fL    MCH 33.3  26.0 - 34.0 pg    MCHC 34.4  30.0 - 36.0 g/dL    RDW 40.9  81.1 - 91.4 %    Platelets 227  150 - 400 K/uL   CREATININE, SERUM     Status: Abnormal   Collection Time   02/24/12  9:33 AM      Component Value Range Comment   Creatinine, Ser 1.12  0.50 - 1.35 mg/dL    GFR calc non Af Amer 62 (*) >90 mL/min    GFR calc Af Amer 72 (*) >90 mL/min     US Abdomen Complete  02/24/2012  *RADIOLOGY REPORT*  Clinical Data:  Abdominal pain  ABDOMINAL ULTRASOUND COMPLETE  Comparison:  CT earlier today  Findings:  Gallbladder:  Multiple gallstones.  The largest is 2.3 cm. Significant gallbladder wall thickening measures up to 7 mm.  The gallbladder wall is  edematous.  No pericholecystic fluid.  No Murphy's sign.  Sludge is noted.  Common Bile Duct:  Within normal limits in caliber.  Liver: No focal mass lesion identified.  Within normal limits in parenchymal echogenicity.  IVC:  Appears normal.  Pancreas:  Obscured by overlying bowel gas.  Spleen:  Within normal limits in size and echotexture.  Right kidney:  Normal in size.  Cortical thinning is noted.  No mass or hydronephrosis.  Left kidney:  Normal in size.  No hydronephrosis or solid mass.  Abdominal Aorta:  Partially obscured.  Maximal visualized caliber is 2.9 cm.  IMPRESSION: Study is limited as described.  Cholelithiasis.  There is significant gallbladder wall thickening and edema but no sonographic Murphy's sign.  Correlate clinically as for the need for nuclear medicine imaging to evaluate for acute cholecystitis.   Original Report Authenticated By: Donavan Burnet, M.D.    Ct Abdomen Pelvis W Contrast  02/24/2012  *RADIOLOGY REPORT*  Clinical Data: Left side abdominal pain and chest pain, question diverticulitis, history coronary artery disease post MI, hypertension, GERD  CT ABDOMEN AND PELVIS WITH CONTRAST  Technique:  Multidetector CT imaging of the abdomen and pelvis was performed following the standard protocol during bolus administration of intravenous contrast. Sagittal and coronal MPR images reconstructed from axial data set.  Contrast: OMNIPAQUE IOHEXOL 300 MG/ML  SOLN Dilute oral contrast.  Comparison: None  Findings: Partial atelectasis of the right lower lobe. Herniation of a portion of right middle lobe through an anterior chest wall defect. 2.5 cm diameter gallstone within gallbladder. Gallbladder appears mildly distended with question minimal pericholecystic edema. No biliary dilatation. Tiny cyst upper pole left kidney. Liver, spleen, pancreas, kidneys, and adrenal glands otherwise normal appearance. Incidental note of a circumaortic left renal vein.  Scattered stool throughout colon  with note of uncomplicated diverticula of the descending and sigmoid colon. No definite evidence of acute diverticulitis. Stomach and small bowel loops unremarkable. Normal appendix. Tiny right side bladder diverticulum at/near ureteral orifice. No mass, adenopathy, free fluid or inflammatory process. Scattered degenerative changes of the lumbar spine.  IMPRESSION: Descending and sigmoid colonic diverticulosis without evidence of diverticulitis. Cholelithiasis with question minimal pericholecystic edema; if there is clinical concern for acute cholecystitis, recommend abdominal ultrasound. Herniation of a small portion of the anterior right middle lobe through a chest wall defect at the anterior lower chest. Tiny bladder diverticulum. No other intra abdominal or intrapelvic abnormalities identified.   Original Report Authenticated By: Lollie Marrow, M.D.    Dg Abd Acute W/chest  02/24/2012  *RADIOLOGY REPORT*  Clinical Data: Left upper quadrant pain, chest pain, shortness of breath, history CABG  ACUTE ABDOMEN SERIES (ABDOMEN 2 VIEW & CHEST 1 VIEW)  Comparison: Chest radiograph 10/29/2009, abdominal radiograph 10/17/2008  Findings: Normal heart size post CABG. Atherosclerotic calcification thoracic aorta. Mediastinal contours and pulmonary vascularity normal. Chronic elevation of right diaphragm with blunting of right costophrenic angle. No acute infiltrate, pleural effusion or pneumothorax. Normal bowel gas pattern. No bowel dilatation, bowel wall thickening or free intraperitoneal air. Multilevel endplate spur formation and disc space narrowing lumbar spine. Diffuse osseous demineralization. No acute osseous findings.  IMPRESSION: Chronic right basilar lung changes/scarring. No acute abdominal findings.   Original Report Authenticated By: Lollie Marrow, M.D.     Review of Systems  Constitutional: Negative.   HENT: Negative.   Eyes: Negative.   Respiratory: Negative.   Cardiovascular: Negative.     Gastrointestinal: Positive for heartburn, nausea and vomiting. Negative for abdominal pain, diarrhea, constipation, blood in stool and melena.  Genitourinary: Negative.   Musculoskeletal: Negative.   Skin: Negative.   Neurological: Negative.   Endo/Heme/Allergies: Negative.   Psychiatric/Behavioral: Negative.    Blood pressure 121/67, pulse 78, temperature 98.2 F (36.8 C), temperature source Oral, resp. rate 18, height 6' 2.5" (1.892 m), weight 193 lb 12.6 oz (87.9 kg), SpO2 96.00%. Physical Exam  Constitutional: He is oriented to person, place, and time. He appears well-developed and well-nourished. No distress.  HENT:  Head: Normocephalic and atraumatic.  Mouth/Throat: No oropharyngeal exudate.  Eyes: Conjunctivae and EOM are normal. Pupils  are equal, round, and reactive to light. Right eye exhibits no discharge. Left eye exhibits no discharge. No scleral icterus.  Neck: Normal range of motion. Neck supple. No JVD present. No tracheal deviation present. No thyromegaly present.  Cardiovascular: Normal rate, regular rhythm and intact distal pulses.  Exam reveals no gallop and no friction rub.   No murmur heard. Respiratory: Effort normal and breath sounds normal. No stridor. No respiratory distress. He has no wheezes. He has no rales. He exhibits no tenderness.  GI: Soft. Bowel sounds are normal. He exhibits no distension and no mass. There is no tenderness. There is no rebound and no guarding.  Musculoskeletal: Normal range of motion. He exhibits no edema and no tenderness.  Lymphadenopathy:    He has no cervical adenopathy.  Neurological: He is alert and oriented to person, place, and time.  Skin: Skin is warm and dry. No rash noted. He is not diaphoretic. No erythema. No pallor.  Psychiatric: He has a normal mood and affect.    Assessment/Plan: Patient Active Problem List  Diagnosis  . BASAL CELL CARCINOMA, NOSE  . HYPOTHYROIDISM  . HYPERCHOLESTEROLEMIA  . ANEMIA, VITAMIN B12  DEFICIENCY  . HYPERTENSION  . MYOCARDIAL INFARCTION, INFEROPOSTERIOR WALL, SUBSEQUENT CARE  . CAD, ARTERY BYPASS GRAFT  . CARDIOMYOPATHY, ISCHEMIC  . PVD  . RESPIRATORY FAILURE  . GERD  . BENIGN PROSTATIC HYPERTROPHY, WITH URINARY OBSTRUCTION  . ACTINIC KERATOSIS  . Loc osteoarth NOS-l/leg  . HOARSENESS  . ADVEF, DRUG/MEDICINAL/BIOLOGICAL SUBST NOS  . DEEP VENOUS THROMBOPHLEBITIS, HX OF  . CORONARY ARTERY BYPASS GRAFT, HX OF  . BASAL CELL CARCINOMA, NOSE  . Abdominal pain  . Diverticulitis large intestine   Given the patient clinical presentation earlier and findings of both his CT and [place], it is likely that he does  Have acute cholecystitis, in spite of the fact that he is currently asymptomatic on examination. Which would also help to explain his elevated WBC. I agree with the plan to obtain a HIDA scan to help [place] r/o finding of acute cholecystitis.  He will need to be NPO for at least 6-8 hrs prior to the study and also he will have to be off all narcotics prior to the study.  Marriah Sanderlin 02/24/2012, 2:55 PM

## 2012-02-24 NOTE — H&P (Signed)
[facility] History and Physical  Zachary Decker ZHY:[id number] DOB: 1932/12/23    PCP:   Carrie Mew, MD   Chief Complaint: substernal chest pain migrating to LLQ pain.  HPI: Zachary Decker is an 78 y.o. male with hx of CAD s/p prior MI and CABG, PVD, HTN, Hypercholesterolemia, GERD, presents to the ER with substernal chest burning for a few days.  He said he took the Nexium and it was helpful.  He denied any SOB, pleuritic pain nor any radiation of his pain.  He took some Peptobismol, but it wasn't helpful.  He therefore presents to the ER.  In the ER, his chest pain resolved completely, but he started complaining of LLQ pain.  Evaluation in the ER showed unremarkable EKG, negative troponin, but he has mild leukocytosis.  He subsequently had a abd/pelvic CT to exclude diverticulitis.  It showed diverticulosis without diverticulitis, and large stone with pericholic edema suggestive of acute cholecystitis.  He has no RUQ pain, and his symptoms are definitely in the LLQ area.  He has had no fever, chills, cough or any other symptomology.  Hospitalist was asked to admit patient for further evaluation of his abdominal pain and to do cardiac r/out.  He had flexible sigmoidoscopy over five years ago, according to him, in the PCP's office and at that time, he was that he had "pockets".   Rewiew of Systems:  Constitutional: Negative for malaise, fever and chills. No significant weight loss or weight gain Eyes: Negative for eye pain, redness and discharge, diplopia, visual changes, or flashes of light. ENMT: Negative for ear pain, hoarseness, nasal congestion, sinus pressure and sore throat. No headaches; tinnitus, drooling, or problem swallowing. Cardiovascular: Negative for palpitations, diaphoresis, dyspnea and peripheral edema. ; No orthopnea, PND Respiratory: Negative for cough, hemoptysis, wheezing and stridor. No pleuritic chestpain. Gastrointestinal: Negative , melena, blood in  stool, hematemesis, jaundice and rectal bleeding.    Genitourinary: Negative for frequency, dysuria, incontinence,flank pain and hematuria; Musculoskeletal: Negative for back pain and neck pain. Negative for swelling and trauma.;  Skin: . Negative for pruritus, rash, abrasions, bruising and skin lesion.; ulcerations Neuro: Negative for headache, lightheadedness and neck stiffness. Negative for weakness, altered level of consciousness , altered mental status, extremity weakness, burning feet, involuntary movement, seizure and syncope.  Psych: negative for anxiety, depression, insomnia, tearfulness, panic attacks, hallucinations, paranoia, suicidal or homicidal ideation    Past Medical History  Diagnosis Date  . CAD (coronary artery disease)   . Acute myocardial infarction of inferoposterior wall, subsequent episode of care   . Hypertension   . Hypercholesterolemia   . PVD (peripheral vascular disease)   . Personal history of thrombophlebitis   . Encounter for long-term (current) use of other medications   . Respiratory failure   . GERD (gastroesophageal reflux disease)   . Hypertrophy of prostate with urinary obstruction and other lower urinary tract symptoms (LUTS)   . Unspecified adverse effect of unspecified drug, medicinal and biological substance   . Unspecified hypothyroidism   . Family history of malignant neoplasm of gastrointestinal tract     Past Surgical History  Procedure Date  . Tonsillectomy   . Knee surgery   . Coronary artery bypass graft     x 5 on August 29, 2008  . Tachecostomy placement     Medications:  HOME MEDS: Prior to Admission medications   Medication Sig Start Date End Date Taking? Authorizing Provider  aspirin 81 MG tablet Take 81 mg by mouth  daily.     Yes Historical Provider, MD  B Complex-C-Folic Acid (FOLBEE PLUS) TABS TAKE 1 TABLET EVERY DAY 11/14/10  Yes Stacie Glaze, MD  carvedilol (COREG) 3.125 MG tablet TAKE 1 TABLET TWICE A DAY WITH FOOD  01/12/12  Yes Stacie Glaze, MD  diazepam (VALIUM) 5 MG tablet Take 5 mg by mouth every 8 (eight) hours as needed. For anxiety   Yes Historical Provider, MD  levothyroxine (SYNTHROID, LEVOTHROID) 50 MCG tablet Take 1 tablet (50 mcg total) by mouth daily. 12/29/11 12/28/12 Yes Stacie Glaze, MD  lisinopril (PRINIVIL,ZESTRIL) 10 MG tablet Take 1 tablet (10 mg total) by mouth daily. 12/29/11  Yes Stacie Glaze, MD  ranitidine (ZANTAC) 300 MG tablet Take 1 tablet (300 mg total) by mouth at bedtime. 12/29/11 12/28/12 Yes Stacie Glaze, MD  rosuvastatin (CRESTOR) 5 MG tablet Take 5 mg by mouth daily.     Yes Historical Provider, MD  Tamsulosin HCl (FLOMAX) 0.4 MG CAPS Take 1 capsule (0.4 mg total) by mouth daily after supper. 12/29/11  Yes Stacie Glaze, MD     Allergies:  Allergies  Allergen Reactions  . Losartan Potassium   . Naproxen   . Penicillins     Social History:   reports that he has never smoked. He does not have any smokeless tobacco history on file. He reports that he does not drink alcohol or use illicit drugs.  Family History: Family History  Problem Relation Age of Onset  . Coronary artery disease Other     positive  . Heart disease Mother   . Tuberculosis Father      Physical Exam: Filed Vitals:   02/24/12 0029 02/24/12 0358  BP: 162/83 134/69  Pulse: 67 69  Temp: 97.6 F (36.4 C)   TempSrc: Oral   Resp: 18 13  SpO2: 96% 99%   Blood pressure 134/69, pulse 69, temperature 97.6 F (36.4 C), temperature source Oral, resp. rate 13, SpO2 99.00%.  GEN:  Pleasant  patient lying in the stretcher in no acute distress; cooperative with exam. PSYCH:  alert and oriented x4; does not appear anxious or depressed; affect is appropriate. HEENT: Mucous membranes pink and anicteric; PERRLA; EOM intact; no cervical lymphadenopathy nor thyromegaly or carotid bruit; no JVD; There were no stridor. Neck is very supple. Breasts:: Not examined CHEST WALL: No tenderness CHEST: Normal  respiration, clear to auscultation bilaterally.  HEART: Regular rate and rhythm.  There are no murmur, rub, or gallops.   BACK: No kyphosis or scoliosis; no CVA tenderness ABDOMEN: soft and non-tender (he had received pain medication) ; no masses, no organomegaly, normal abdominal bowel sounds; no pannus; no intertriginous candida. There is no rebound and no distention. Rectal Exam: Not done EXTREMITIES: No bone or joint deformity; age-appropriate arthropathy of the hands and knees; no edema; no ulcerations.  There is no calf tenderness. Genitalia: not examined PULSES: 2+ and symmetric SKIN: Normal hydration no rash or ulceration CNS: Cranial nerves 2-12 grossly intact no focal lateralizing neurologic deficit.  Speech is fluent; uvula elevated with phonation, facial symmetry and tongue midline. DTR are normal bilaterally, cerebella exam is intact, barbinski is negative and strengths are equaled bilaterally.  No sensory loss.   Labs on Admission:  Basic Metabolic Panel:  Lab 02/24/12 1610  NA 134*  K 5.3*  CL 99  CO2 27  GLUCOSE 121*  BUN 23  CREATININE 1.20  CALCIUM 10.1  MG --  PHOS --   Liver Function  Tests: No results found for this basename: AST:5,ALT:5,ALKPHOS:5,BILITOT:5,PROT:5,ALBUMIN:5 in the last 168 hours  Lab 02/24/12 0029  LIPASE 34  AMYLASE --   No results found for this basename: AMMONIA:5 in the last 168 hours CBC:  Lab 02/24/12 0029  WBC 16.4*  NEUTROABS 11.2*  HGB 15.0  HCT 42.5  MCV 95.9  PLT 249   Cardiac Enzymes:  Lab 02/24/12 0507  CKTOTAL --  CKMB --  CKMBINDEX --  TROPONINI <0.30    CBG: No results found for this basename: GLUCAP:5 in the last 168 hours   Radiological Exams on Admission: Ct Abdomen Pelvis W Contrast  02/24/2012  *RADIOLOGY REPORT*  Clinical Data: Left side abdominal pain and chest pain, question diverticulitis, history coronary artery disease post MI, hypertension, GERD  CT ABDOMEN AND PELVIS WITH CONTRAST  Technique:   Multidetector CT imaging of the abdomen and pelvis was performed following the standard protocol during bolus administration of intravenous contrast. Sagittal and coronal MPR images reconstructed from axial data set.  Contrast: OMNIPAQUE IOHEXOL 300 MG/ML  SOLN Dilute oral contrast.  Comparison: None  Findings: Partial atelectasis of the right lower lobe. Herniation of a portion of right middle lobe through an anterior chest wall defect. 2.5 cm diameter gallstone within gallbladder. Gallbladder appears mildly distended with question minimal pericholecystic edema. No biliary dilatation. Tiny cyst upper pole left kidney. Liver, spleen, pancreas, kidneys, and adrenal glands otherwise normal appearance. Incidental note of a circumaortic left renal vein.  Scattered stool throughout colon with note of uncomplicated diverticula of the descending and sigmoid colon. No definite evidence of acute diverticulitis. Stomach and small bowel loops unremarkable. Normal appendix. Tiny right side bladder diverticulum at/near ureteral orifice. No mass, adenopathy, free fluid or inflammatory process. Scattered degenerative changes of the lumbar spine.  IMPRESSION: Descending and sigmoid colonic diverticulosis without evidence of diverticulitis. Cholelithiasis with question minimal pericholecystic edema; if there is clinical concern for acute cholecystitis, recommend abdominal ultrasound. Herniation of a small portion of the anterior right middle lobe through a chest wall defect at the anterior lower chest. Tiny bladder diverticulum. No other intra abdominal or intrapelvic abnormalities identified.   Original Report Authenticated By: Lollie Marrow, M.D.    Dg Abd Acute W/chest  02/24/2012  *RADIOLOGY REPORT*  Clinical Data: Left upper quadrant pain, chest pain, shortness of breath, history CABG  ACUTE ABDOMEN SERIES (ABDOMEN 2 VIEW & CHEST 1 VIEW)  Comparison: Chest radiograph 10/29/2009, abdominal radiograph 10/17/2008   Findings: Normal heart size post CABG. Atherosclerotic calcification thoracic aorta. Mediastinal contours and pulmonary vascularity normal. Chronic elevation of right diaphragm with blunting of right costophrenic angle. No acute infiltrate, pleural effusion or pneumothorax. Normal bowel gas pattern. No bowel dilatation, bowel wall thickening or free intraperitoneal air. Multilevel endplate spur formation and disc space narrowing lumbar spine. Diffuse osseous demineralization. No acute osseous findings.  IMPRESSION: Chronic right basilar lung changes/scarring. No acute abdominal findings.   Original Report Authenticated By: Lollie Marrow, M.D.     EKG: Independently reviewed. NSR with no ischemic changes.   Assessment/Plan Present on Admission:  .Abdominal pain .Diverticulitis large intestine .HYPERCHOLESTEROLEMIA .GERD .HYPOTHYROIDISM .CAD, ARTERY BYPASS GRAFT .HYPERTENSION   PLAN:  Clinically, he presents with GERD and diverticulitis.  He does have hx of diverticulosis, leukocytosis and LLQ pain suspicious for diverticulitis, but the CT didn't find evidence of it.  The CT was suggestive of acute cholecystitis, and recommended an abd [place], which I will order, but clinically, he doesn't present with acute cholecystitis.  His atypical CP was likely to be GERD.  Plan is to admit him to general medical floor and place him on clear liquid, start Cipro and Flagyl for presumptive dx of diverticulitis.  Will also cycle his troponins although I don't think it is cardiac chest pain.  With his hypothyroidism, he is on low dose of Synthroid and will check his TSH.  For his HTN and Hyperlipidemia, will continue his meds.  He is stable, full code, and will be admitted to [facility] service.  Other plans as per orders.  Code Status: FULL Unk Lightning, MD. [facility] Pager [phone] 7pm to 7am.  02/24/2012, 6:21 AM

## 2012-02-24 NOTE — Progress Notes (Signed)
Utilization review complete 

## 2012-02-24 NOTE — ED Notes (Signed)
Pt c/o left sided abdominal and chest pain since this morning.  Describes as a dull pain, 8/10 at it's worst.  Took Pepto-Bismol and "anxiety medicine" PTA.  States pain is 4/10 at this time.  No abdominal/chest tenderness to palpation.  Denies n/v, no diaphoresis, no HA.  Pain does not increase with inspiration.

## 2012-02-24 NOTE — Progress Notes (Addendum)
Pt seen and examined Admitted this am,by Dr.Lee, initially had Heartburn like symptoms but then started having Epigastric abd pain, and tenderness with vomiting yesterday Feels better pain wise, sleepy from narcotic CT and Abd USG reviewed Gall bladder wall thickening with large galls stones, WBC 17.9, murphy's sign negative Improving on Cipro/Flagyl and supportive care,  Will request Surgical consult and also get HIDA scan cut down narcotics due to lethargy Rest of plan as noted by Dr.Lee  Zannie Cove, MD [phone]

## 2012-02-24 NOTE — ED Provider Notes (Signed)
History     CSN: [id number]  Arrival date & time 02/24/12  Zachary Decker   First MD Initiated Contact with Patient 02/24/12 0028      Chief Complaint  Patient presents with  . Chest Pain    HPI The patient presents with left sided abdominal pain.  Contrary to the triage note the patient denies any chest pain.  He also denies any dyspnea. He is over the past approximately 14 hours has had pain persistently in the left abdomen, sore, intermittently severe, not improved with oral medication.  The pain is nonradiating.  He denies concurrent nausea or vomiting.  He does note that he has had multiple episodes of loose stool today, though he took a laxative yesterday. He notes preceding constipation, but no other notable recent medical changes. His complaints on medication. His history of GERD, states this pain is similar, though not the same as his typical symptoms.  Past Medical History  Diagnosis Date  . CAD (coronary artery disease)   . Acute myocardial infarction of inferoposterior wall, subsequent episode of care   . Hypertension   . Hypercholesterolemia   . PVD (peripheral vascular disease)   . Personal history of thrombophlebitis   . Encounter for long-term (current) use of other medications   . Respiratory failure   . GERD (gastroesophageal reflux disease)   . Hypertrophy of prostate with urinary obstruction and other lower urinary tract symptoms (LUTS)   . Unspecified adverse effect of unspecified drug, medicinal and biological substance   . Unspecified hypothyroidism   . Family history of malignant neoplasm of gastrointestinal tract     Past Surgical History  Procedure Date  . Tonsillectomy   . Knee surgery   . Coronary artery bypass graft     x 5 on August 29, 2008  . Tachecostomy placement     Family History  Problem Relation Age of Onset  . Coronary artery disease Other     positive  . Heart disease Mother   . Tuberculosis Father     History  Substance Use Topics  .  Smoking status: Never Smoker   . Smokeless tobacco: Not on file  . Alcohol Use: No      Review of Systems  Constitutional:       Per HPI, otherwise negative  HENT:       Per HPI, otherwise negative  Eyes: Negative.   Respiratory:       Per HPI, otherwise negative  Cardiovascular:       Per HPI, otherwise negative  Gastrointestinal: Negative for vomiting.  Genitourinary: Negative.   Musculoskeletal:       Per HPI, otherwise negative  Skin: Negative.   Neurological: Negative for syncope.    Allergies  Losartan potassium; Naproxen; and Penicillins  Home Medications   Current Outpatient Rx  Name Route Sig Dispense Refill  . ASPIRIN 81 MG PO TABS Oral Take 81 mg by mouth daily.      Marland Kitchen FOLBEE PLUS PO TABS  TAKE 1 TABLET EVERY DAY 30 tablet 6  . FOLBEE PLUS CZ 5 MG PO TABS Oral Take 1 tablet (5 mg total) by mouth daily at 6 (six) AM. 90 tablet 3  . FOLBEE PLUS CZ 5 MG PO TABS  TAKE 1 TABLET EVERY DAY 30 tablet 3  . CARVEDILOL 3.125 MG PO TABS  TAKE 1 TABLET TWICE A DAY WITH FOOD 180 tablet 3  . LEVOTHYROXINE SODIUM 50 MCG PO TABS Oral Take 1 tablet (50 mcg total)  by mouth daily. 90 tablet 3  . LEVOTHYROXINE SODIUM 50 MCG PO TABS  TAKE 1 TABLET EVERY DAY 30 tablet 11  . LISINOPRIL 10 MG PO TABS Oral Take 1 tablet (10 mg total) by mouth daily. 90 tablet 3  . RANITIDINE HCL 300 MG PO TABS Oral Take 1 tablet (300 mg total) by mouth at bedtime. 90 tablet 3  . ROSUVASTATIN CALCIUM 5 MG PO TABS Oral Take 5 mg by mouth daily.      Marland Kitchen TAMSULOSIN HCL 0.4 MG PO CAPS Oral Take 1 capsule (0.4 mg total) by mouth daily after supper. 90 capsule 3    BP 162/83  Pulse 67  Temp 97.6 F (36.4 C) (Oral)  Resp 18  SpO2 96%  Physical Exam  Nursing note and vitals reviewed. Constitutional: He is oriented to person, place, and time. He appears well-developed. No distress.  HENT:  Head: Normocephalic and atraumatic.  Eyes: Conjunctivae and EOM are normal.  Cardiovascular: Normal rate and  regular rhythm.   Pulmonary/Chest: Effort normal. No stridor. No respiratory distress.  Abdominal: He exhibits no distension. There is no tenderness. There is no rebound and no guarding.  Musculoskeletal: He exhibits no edema.  Neurological: He is alert and oriented to person, place, and time.  Skin: Skin is warm and dry.  Psychiatric: He has a normal mood and affect.    ED Course  Procedures (including critical care time)  Labs Reviewed  CBC WITH DIFFERENTIAL - Abnormal; Notable for the following:    WBC 16.4 (*)     Neutro Abs 11.2 (*)     Monocytes Absolute 1.8 (*)     All other components within normal limits  BASIC METABOLIC PANEL  LIPASE, BLOOD  URINALYSIS, ROUTINE W REFLEX MICROSCOPIC  POCT I-STAT TROPONIN I   No results found.   No diagnosis found.  Cardiac 65 sinus rhythm normal Pulse ox 99% room air normal   Date: 02/24/2012  Rate: 69  Rhythm: normal sinus rhythm  QRS Axis: left  Intervals: PR prolonged  ST/T Wave abnormalities: nonspecific T wave changes  Conduction Disutrbances:first-degree A-V block   Narrative Interpretation:   Old EKG Reviewed: changes noted ABNORMAL  2:22 AM Patient continues to have LUQ pain.  CT ordered.  5:23 AM The patient notes that his pain seems to recur after medicine wears off.  He currently is improved, but MDM  This elderly male with multiple medical problems, particularly coronary artery disease now presents with pain that seems to be most prominent in the left upper quadrant of his abdomen.  Given the patient's lack of distress, the reproducible pain on exam, the patient had a CT of his abdomen to rule out diverticulitis.  This was negative, though there was findings consistent with hepatobiliary changes.  A repeat evaluation after those results are obtained confirms the patient has no symptoms on the right side of his abdomen, supporting the low suspicion of acute cholecystitis.  Given the patient's history, risk factors,  the absence of complete resolution of his pain, he was admitted for further evaluation and management.  Gerhard Munch, MD 02/24/12 [phone]

## 2012-02-24 NOTE — Progress Notes (Signed)
Pt arrived to the unit with dx. Abdominal pain in LUQ. Pt is alert and oriented x4. Ambulatory with stand by assist. No skin breakdown noted. Complain of moderate pain but denies the need for any pain meds. VS stable. Family at bedside. Family and pt oriented to the unit. No distress noted. Will cont to monitor.

## 2012-02-24 NOTE — Consult Note (Signed)
Pt seen and evaluated.  He is pain free now.  No pain on exam and nondistended.  We discussed with him possible cholecystectomy if needed but again, currently he is pain free.  He has had atypical symptoms if this is indeed from his gallbladder.  He had LLQ and lt sided pain and no right sided pain.  This could be diverticulitis, PUD, gerd, or angina as well but other workup so far negative.  He is not really that interested in cholecystectomy unless he has cholecystitis.  HIDA would be helpful if he does not continue to improve clinically.  Even if he does resolve this episode nonoperatively, I would recommend continued outpatient workup.

## 2012-02-24 NOTE — ED Notes (Signed)
PT. REPORTS MID CHEST PAIN ONSET YESTERDAY AFTERNOON WITH SLIGHT SOB , DENIES NAUSEA OR DIAPHORESIS , TOOK TYLENOL WITH SLIGHT RELIEF.

## 2012-02-25 ENCOUNTER — Inpatient Hospital Stay (HOSPITAL_COMMUNITY): Payer: Medicare Other

## 2012-02-25 DIAGNOSIS — D72829 Elevated white blood cell count, unspecified: Secondary | ICD-10-CM | POA: Diagnosis present

## 2012-02-25 DIAGNOSIS — N179 Acute kidney failure, unspecified: Secondary | ICD-10-CM | POA: Diagnosis not present

## 2012-02-25 LAB — COMPREHENSIVE METABOLIC PANEL
ALT: 16 U/L (ref 0–53)
AST: 26 U/L (ref 0–37)
Alkaline Phosphatase: 50 U/L (ref 39–117)
CO2: 24 mEq/L (ref 19–32)
GFR calc Af Amer: 50 mL/min — ABNORMAL LOW (ref >90)
Glucose, Bld: 119 mg/dL — ABNORMAL HIGH (ref 70–99)
Potassium: 4.5 mEq/L (ref 3.5–5.1)
Sodium: 134 mEq/L — ABNORMAL LOW (ref 135–145)
Total Protein: 6.6 g/dL (ref 6.0–8.3)

## 2012-02-25 LAB — CBC
Hemoglobin: 13.1 g/dL (ref 13.0–17.0)
MCHC: 34.5 g/dL (ref 30.0–36.0)
Platelets: 198 10*3/uL (ref 150–400)
RBC: 3.91 MIL/uL — ABNORMAL LOW (ref 4.22–5.81)

## 2012-02-25 MED ORDER — TECHNETIUM TC 99M MEBROFENIN IV KIT
5.0000 | PACK | Freq: Once | INTRAVENOUS | Status: AC | PRN
  Administered 2012-02-25: 5 via INTRAVENOUS

## 2012-02-25 MED ORDER — KETOROLAC TROMETHAMINE 15 MG/ML IJ SOLN
15.0000 mg | Freq: Four times a day (QID) | INTRAMUSCULAR | Status: DC | PRN
  Administered 2012-02-25 – 2012-02-27 (×5): 15 mg via INTRAVENOUS
  Filled 2012-02-25 (×7): qty 1

## 2012-02-25 MED ORDER — TECHNETIUM TC 99M MEBROFENIN IV KIT: 5.0000 | PACK | Freq: Once | INTRAVENOUS | Status: AC | PRN

## 2012-02-25 MED ORDER — LISINOPRIL 10 MG PO TABS
10.0000 mg | ORAL_TABLET | Freq: Every day | ORAL | Status: DC
  Filled 2012-02-25: qty 1

## 2012-02-25 MED ORDER — WHITE PETROLATUM GEL
Status: AC
  Administered 2012-02-25: 12:00:00
  Filled 2012-02-25: qty 5

## 2012-02-25 NOTE — Progress Notes (Signed)
Subjective: Patient feeling well. Sitting up in chair. Patient states he is hungry/thirsty. He does state he has some mild pain in his right ribcage (Pt states he pulled it playing golf then it was aggravated by abd u/s yesterday.) No BM in 3 days. OOB with no problems. Awaiting HIDA scan today.  Objective: Vital signs in last 24 hours: Temp:  [97.6 F (36.4 C)-98.9 F (37.2 C)] 98.9 F (37.2 C) (09/04 0619) Pulse Rate:  [68-83] 71  (09/04 0619) Resp:  [18-19] 18  (09/04 0619) BP: (94-136)/(54-79) 94/54 mmHg (09/04 0619) SpO2:  [90 %-98 %] 90 % (09/04 0619) Weight:  [191 lb 9.3 oz (86.9 kg)-193 lb 12.6 oz (87.9 kg)] 191 lb 9.3 oz (86.9 kg) (09/03 2058) Last BM Date: 02/23/12  Intake/Output from previous day: 09/03 0701 - 09/04 0700 In: 600 [P.O.:600] Out: 325 [Urine:325] Intake/Output this shift:   Gen: Sitting up in chair, NAD. Very pleasant Abd: +BS, no focal tenderness.  Lab Results:   [facility] 02/25/12 0620 02/24/12 0933  WBC 24.1* 17.9*  HGB 13.1 13.8  HCT 38.0* 40.1  PLT 198 227   BMET  Basename 02/25/12 0620 02/24/12 0933 02/24/12 0029  NA 134* -- 134*  K 4.5 -- 5.3*  CL 100 -- 99  CO2 24 -- 27  GLUCOSE 119* -- 121*  BUN 26* -- 23  CREATININE 1.52* 1.12 --  CALCIUM 9.3 -- 10.1   Studies/Results: US Abdomen Complete  02/24/2012  *RADIOLOGY REPORT*  Clinical Data:  Abdominal pain  ABDOMINAL ULTRASOUND COMPLETE  Comparison:  CT earlier today  Findings:  Gallbladder:  Multiple gallstones.  The largest is 2.3 cm. Significant gallbladder wall thickening measures up to 7 mm.  The gallbladder wall is edematous.  No pericholecystic fluid.  No Murphy's sign.  Sludge is noted.  Common Bile Duct:  Within normal limits in caliber.  Liver: No focal mass lesion identified.  Within normal limits in parenchymal echogenicity.  IVC:  Appears normal.  Pancreas:  Obscured by overlying bowel gas.  Spleen:  Within normal limits in size and echotexture.  Right kidney:  Normal in  size.  Cortical thinning is noted.  No mass or hydronephrosis.  Left kidney:  Normal in size.  No hydronephrosis or solid mass.  Abdominal Aorta:  Partially obscured.  Maximal visualized caliber is 2.9 cm.  IMPRESSION: Study is limited as described.  Cholelithiasis.  There is significant gallbladder wall thickening and edema but no sonographic Murphy's sign.  Correlate clinically as for the need for nuclear medicine imaging to evaluate for acute cholecystitis.   Original Report Authenticated By: Donavan Burnet, M.D.    Ct Abdomen Pelvis W Contrast  02/24/2012  *RADIOLOGY REPORT*  Clinical Data: Left side abdominal pain and chest pain, question diverticulitis, history coronary artery disease post MI, hypertension, GERD  CT ABDOMEN AND PELVIS WITH CONTRAST  Technique:  Multidetector CT imaging of the abdomen and pelvis was performed following the standard protocol during bolus administration of intravenous contrast. Sagittal and coronal MPR images reconstructed from axial data set.  Contrast: OMNIPAQUE IOHEXOL 300 MG/ML  SOLN Dilute oral contrast.  Comparison: None  Findings: Partial atelectasis of the right lower lobe. Herniation of a portion of right middle lobe through an anterior chest wall defect. 2.5 cm diameter gallstone within gallbladder. Gallbladder appears mildly distended with question minimal pericholecystic edema. No biliary dilatation. Tiny cyst upper pole left kidney. Liver, spleen, pancreas, kidneys, and adrenal glands otherwise normal appearance. Incidental note of a circumaortic left  renal vein.  Scattered stool throughout colon with note of uncomplicated diverticula of the descending and sigmoid colon. No definite evidence of acute diverticulitis. Stomach and small bowel loops unremarkable. Normal appendix. Tiny right side bladder diverticulum at/near ureteral orifice. No mass, adenopathy, free fluid or inflammatory process. Scattered degenerative changes of the lumbar spine.  IMPRESSION:  Descending and sigmoid colonic diverticulosis without evidence of diverticulitis. Cholelithiasis with question minimal pericholecystic edema; if there is clinical concern for acute cholecystitis, recommend abdominal ultrasound. Herniation of a small portion of the anterior right middle lobe through a chest wall defect at the anterior lower chest. Tiny bladder diverticulum. No other intra abdominal or intrapelvic abnormalities identified.   Original Report Authenticated By: Lollie Marrow, M.D.    Dg Abd Acute W/chest  02/24/2012  *RADIOLOGY REPORT*  Clinical Data: Left upper quadrant pain, chest pain, shortness of breath, history CABG  ACUTE ABDOMEN SERIES (ABDOMEN 2 VIEW & CHEST 1 VIEW)  Comparison: Chest radiograph 10/29/2009, abdominal radiograph 10/17/2008  Findings: Normal heart size post CABG. Atherosclerotic calcification thoracic aorta. Mediastinal contours and pulmonary vascularity normal. Chronic elevation of right diaphragm with blunting of right costophrenic angle. No acute infiltrate, pleural effusion or pneumothorax. Normal bowel gas pattern. No bowel dilatation, bowel wall thickening or free intraperitoneal air. Multilevel endplate spur formation and disc space narrowing lumbar spine. Diffuse osseous demineralization. No acute osseous findings.  IMPRESSION: Chronic right basilar lung changes/scarring. No acute abdominal findings.   Original Report Authenticated By: Lollie Marrow, M.D.    Anti-infectives: Anti-infectives     Start     Dose/Rate Route Frequency Ordered Stop   02/24/12 1000   metroNIDAZOLE (FLAGYL) IVPB 500 mg        500 mg 100 mL/hr over 60 Minutes Intravenous Every 8 hours 02/24/12 0847     02/24/12 0700   ciprofloxacin (CIPRO) IVPB 400 mg        400 mg 200 mL/hr over 60 Minutes Intravenous Every 12 hours 02/24/12 1610            Assessment/Plan: 78 yo M with hx of CAD s/p prior MI and CABG, PVD, HTN, Hypercholesterolemia, GERD with acute cholecystitis on  imaging  - NPO for HIDA scan today - Pt is currently asymptomatic except mild intermittent nausea and "pain in ribcage" - He states he would like to have surgery, if clinically indicated. - WBC elevated to 24.1 today. Continue to monitor (could be from acute cholecystitis vs. Diverticulitis)  - Await HIDA today before making more definite plan. Continue NPO. Will discuss with Dr. Biagio Quint.   LOS: 1 day    Zachary Decker 02/25/2012

## 2012-02-25 NOTE — Progress Notes (Signed)
[facility] PROGRESS NOTE  MADDOXX BURKITT ZOX:[id number] DOB: Nov 20, 1932 DOA: 02/24/2012 PCP: Carrie Mew, MD  Assessment/Plan: Principal Problem:  *Abdominal pain Active Problems:  HYPOTHYROIDISM  HYPERCHOLESTEROLEMIA  HYPERTENSION  CAD, ARTERY BYPASS GRAFT  GERD  Diverticulitis large intestine  1. Abdominal pain: Abnormal gallbladder on CT abdomen concerning for acute cholecystitis. Surgical consultation appreciated. Improved and patient has intermittent epigastric discomfort. Awaiting HIDA scan. No features of acute diverticulitis on CT. White cell count increased but clinically benign abdomen. For now continue empiric IV antibiotics. 2. Leukocytosis:? Secondary to abdominal process. For now continue empiric IV Cipro and Flagyl. 3. Acute renal failure:? Secondary to dehydration and recent contrast. Increase IV fluids and follow BMP. Temporarily hold lisinopril. 4. Hypertension: Controlled. Continue carvedilol and temporarily hold lisinopril secondary to increased creatinine. 5. History of CAD, status post CABG 3-1/2 years ago. Denies chest pain. Multiple troponins negative. 6. GERD: PPI. 7. Diverticulosis: CT abdomen did not show diverticulitis. On empiric IV Cipro and Flagyl. 8. Hypothyroidism  Code Status: Full Family Communication: - Disposition Plan: Based on surgery recommendations.   Brief narrative: 78 y.o. male with hx of CAD s/p prior MI and CABG, PVD, HTN, Hypercholesterolemia, GERD, presents to the ER with substernal chest burning for a few days. He said he took the Nexium and it was helpful. He denied any SOB, pleuritic pain nor any radiation of his pain. He took some Peptobismol, but it wasn't helpful. He therefore presents to the ER. In the ER, his chest pain resolved completely, but he started complaining of LLQ pain. Evaluation in the ER showed unremarkable EKG, negative troponin, but he has mild leukocytosis. He subsequently had a abd/pelvic CT to  exclude diverticulitis. It showed diverticulosis without diverticulitis, and large stone with pericholic edema suggestive of acute cholecystitis.   Consultants:  General surgery.   Procedures:  None   Antibiotics:  IV Cipro and Flagyl 9/3  HPI/Subjective: Patient complains of intermittent epigastric discomfort but no pain. No nausea or vomiting. Denies chest pain, dyspnea or palpitations.  Objective: Filed Vitals:   02/24/12 1741 02/24/12 2058 02/25/12 0619 02/25/12 0919  BP: 119/68 112/62 94/54 112/66  Pulse: 77 68 71 66  Temp: 98 F (36.7 C) 97.7 F (36.5 C) 98.9 F (37.2 C) 97.6 F (36.4 C)  TempSrc: Oral Oral Oral Oral  Resp: 18 19 18 18   Height:  6' 2.5" (1.892 m)    Weight:  86.9 kg (191 lb 9.3 oz)    SpO2: 98% 91% 90% 93%    Intake/Output Summary (Last 24 hours) at 02/25/12 1502 Last data filed at 02/25/12 0900  Gross per 24 hour  Intake    360 ml  Output    400 ml  Net    -40 ml   Filed Weights   02/24/12 0925 02/24/12 2058  Weight: 87.9 kg (193 lb 12.6 oz) 86.9 kg (191 lb 9.3 oz)    Exam: General exam: Moderately built and nourished male lying comfortably in bed. Respiratory system: Clear to auscultation. No increased work of breathing. Cardiovascular system: First and second heart sounds heard, regular rate and rhythm. No JVD, murmurs or pedal edema. Gastrointestinal system: Abdomen is nondistended, soft and nontender. Normal bowel sounds heard. No organomegaly or masses appreciated. Central nervous system: Alert and oriented x3. No focal neurological deficits. Extremities: Symmetric 5 x 5 power.   Data Reviewed: Basic Metabolic Panel:  Lab 02/25/12 8119 02/24/12 0933 02/24/12 0029  NA 134* -- 134*  K 4.5 -- 5.3*  CL  100 -- 99  CO2 24 -- 27  GLUCOSE 119* -- 121*  BUN 26* -- 23  CREATININE 1.52* 1.12 1.20  CALCIUM 9.3 -- 10.1  MG -- -- --  PHOS -- -- --   Liver Function Tests:  Lab 02/25/12 0620 02/24/12 0507  AST 26 27  ALT 16 18    ALKPHOS 50 59  BILITOT 0.6 0.4  PROT 6.6 7.0  ALBUMIN 3.3* 3.8    Lab 02/24/12 0029  LIPASE 34  AMYLASE --   No results found for this basename: AMMONIA:5 in the last 168 hours CBC:  Lab 02/25/12 0620 02/24/12 0933 02/24/12 0029  WBC 24.1* 17.9* 16.4*  NEUTROABS -- -- 11.2*  HGB 13.1 13.8 15.0  HCT 38.0* 40.1 42.5  MCV 97.2 96.6 95.9  PLT 198 227 249   Cardiac Enzymes:  Lab 02/24/12 2040 02/24/12 1415 02/24/12 0932 02/24/12 0507  CKTOTAL -- -- -- --  CKMB -- -- -- --  CKMBINDEX -- -- -- --  TROPONINI <0.30 <0.30 <0.30 <0.30   BNP (last 3 results) No results found for this basename: PROBNP:3 in the last 8760 hours CBG: No results found for this basename: GLUCAP:5 in the last 168 hours  No results found for this or any previous visit (from the past 240 hour(s)).   Studies: US Abdomen Complete  02/24/2012  *RADIOLOGY REPORT*  Clinical Data:  Abdominal pain  ABDOMINAL ULTRASOUND COMPLETE  Comparison:  CT earlier today  Findings:  Gallbladder:  Multiple gallstones.  The largest is 2.3 cm. Significant gallbladder wall thickening measures up to 7 mm.  The gallbladder wall is edematous.  No pericholecystic fluid.  No Murphy's sign.  Sludge is noted.  Common Bile Duct:  Within normal limits in caliber.  Liver: No focal mass lesion identified.  Within normal limits in parenchymal echogenicity.  IVC:  Appears normal.  Pancreas:  Obscured by overlying bowel gas.  Spleen:  Within normal limits in size and echotexture.  Right kidney:  Normal in size.  Cortical thinning is noted.  No mass or hydronephrosis.  Left kidney:  Normal in size.  No hydronephrosis or solid mass.  Abdominal Aorta:  Partially obscured.  Maximal visualized caliber is 2.9 cm.  IMPRESSION: Study is limited as described.  Cholelithiasis.  There is significant gallbladder wall thickening and edema but no sonographic Murphy's sign.  Correlate clinically as for the need for nuclear medicine imaging to evaluate for acute  cholecystitis.   Original Report Authenticated By: Donavan Burnet, M.D.    Ct Abdomen Pelvis W Contrast  02/24/2012  *RADIOLOGY REPORT*  Clinical Data: Left side abdominal pain and chest pain, question diverticulitis, history coronary artery disease post MI, hypertension, GERD  CT ABDOMEN AND PELVIS WITH CONTRAST  Technique:  Multidetector CT imaging of the abdomen and pelvis was performed following the standard protocol during bolus administration of intravenous contrast. Sagittal and coronal MPR images reconstructed from axial data set.  Contrast: OMNIPAQUE IOHEXOL 300 MG/ML  SOLN Dilute oral contrast.  Comparison: None  Findings: Partial atelectasis of the right lower lobe. Herniation of a portion of right middle lobe through an anterior chest wall defect. 2.5 cm diameter gallstone within gallbladder. Gallbladder appears mildly distended with question minimal pericholecystic edema. No biliary dilatation. Tiny cyst upper pole left kidney. Liver, spleen, pancreas, kidneys, and adrenal glands otherwise normal appearance. Incidental note of a circumaortic left renal vein.  Scattered stool throughout colon with note of uncomplicated diverticula of the descending and sigmoid colon.  No definite evidence of acute diverticulitis. Stomach and small bowel loops unremarkable. Normal appendix. Tiny right side bladder diverticulum at/near ureteral orifice. No mass, adenopathy, free fluid or inflammatory process. Scattered degenerative changes of the lumbar spine.  IMPRESSION: Descending and sigmoid colonic diverticulosis without evidence of diverticulitis. Cholelithiasis with question minimal pericholecystic edema; if there is clinical concern for acute cholecystitis, recommend abdominal ultrasound. Herniation of a small portion of the anterior right middle lobe through a chest wall defect at the anterior lower chest. Tiny bladder diverticulum. No other intra abdominal or intrapelvic abnormalities identified.    Original Report Authenticated By: Lollie Marrow, M.D.    Dg Abd Acute W/chest  02/24/2012  *RADIOLOGY REPORT*  Clinical Data: Left upper quadrant pain, chest pain, shortness of breath, history CABG  ACUTE ABDOMEN SERIES (ABDOMEN 2 VIEW & CHEST 1 VIEW)  Comparison: Chest radiograph 10/29/2009, abdominal radiograph 10/17/2008  Findings: Normal heart size post CABG. Atherosclerotic calcification thoracic aorta. Mediastinal contours and pulmonary vascularity normal. Chronic elevation of right diaphragm with blunting of right costophrenic angle. No acute infiltrate, pleural effusion or pneumothorax. Normal bowel gas pattern. No bowel dilatation, bowel wall thickening or free intraperitoneal air. Multilevel endplate spur formation and disc space narrowing lumbar spine. Diffuse osseous demineralization. No acute osseous findings.  IMPRESSION: Chronic right basilar lung changes/scarring. No acute abdominal findings.   Original Report Authenticated By: Lollie Marrow, M.D.     Scheduled Meds:    . aspirin EC  81 mg Oral Daily  . atorvastatin  10 mg Oral q1800  . carvedilol  3.125 mg Oral BID WC  . ciprofloxacin  400 mg Intravenous Q12H  . docusate sodium  100 mg Oral BID  . famotidine  20 mg Oral BID  . heparin  5,000 Units Subcutaneous Q8H  . levothyroxine  50 mcg Oral QAC breakfast  . lisinopril  10 mg Oral Daily  . metronidazole  500 mg Intravenous Q8H  . Tamsulosin HCl  0.4 mg Oral QPC supper  . white petrolatum       Continuous Infusions:    . dextrose 5 % and 0.9% NaCl 50 mL (02/24/12 1026)    Time spent: 30 minutes    [facility]  [facility] Pager [phone]. If 8PM-8AM, please contact night-coverage at [email or web address], password [facility] 02/25/2012, 3:02 PM  LOS: 1 day

## 2012-02-25 NOTE — Progress Notes (Signed)
He is puzzling because he has no discomfort but his wbc has increased.  Given GB thickening on [place] and wbc and for lack of a better cause.  The GB is a likely culprit even in the absence of pain. Scheduled for HIDA today.

## 2012-02-26 ENCOUNTER — Inpatient Hospital Stay (HOSPITAL_COMMUNITY): Payer: Medicare Other

## 2012-02-26 ENCOUNTER — Encounter (HOSPITAL_COMMUNITY)
Admission: EM | Disposition: A | Discharge status: Home / Self Care | Payer: Self-pay | Source: Home / Self Care | Attending: Internal Medicine

## 2012-02-26 ENCOUNTER — Encounter (HOSPITAL_COMMUNITY): Payer: Self-pay | Admitting: Anesthesiology

## 2012-02-26 ENCOUNTER — Inpatient Hospital Stay (HOSPITAL_COMMUNITY): Payer: Medicare Other | Admitting: Anesthesiology

## 2012-02-26 HISTORY — PX: CHOLECYSTECTOMY: SHX55

## 2012-02-26 LAB — CBC WITH DIFFERENTIAL/PLATELET
HCT: 34.5 % — ABNORMAL LOW (ref 39.0–52.0)
Hemoglobin: 12 g/dL — ABNORMAL LOW (ref 13.0–17.0)
Lymphocytes Relative: 12 % (ref 12–46)
Lymphs Abs: 2 10*3/uL (ref 0.7–4.0)
Monocytes Absolute: 1.9 10*3/uL — ABNORMAL HIGH (ref 0.1–1.0)
Monocytes Relative: 12 % (ref 3–12)
Neutro Abs: 11.9 10*3/uL — ABNORMAL HIGH (ref 1.7–7.7)
Neutrophils Relative %: 74 % (ref 43–77)
RBC: 3.55 MIL/uL — ABNORMAL LOW (ref 4.22–5.81)
WBC: 16.1 10*3/uL — ABNORMAL HIGH (ref 4.0–10.5)

## 2012-02-26 LAB — COMPREHENSIVE METABOLIC PANEL
Albumin: 2.9 g/dL — ABNORMAL LOW (ref 3.5–5.2)
Alkaline Phosphatase: 49 U/L (ref 39–117)
BUN: 25 mg/dL — ABNORMAL HIGH (ref 6–23)
CO2: 25 mEq/L (ref 19–32)
Chloride: 104 mEq/L (ref 96–112)
Creatinine, Ser: 1.48 mg/dL — ABNORMAL HIGH (ref 0.50–1.35)
GFR calc non Af Amer: 44 mL/min — ABNORMAL LOW (ref >90)
Potassium: 4.6 mEq/L (ref 3.5–5.1)
Total Bilirubin: 0.4 mg/dL (ref 0.3–1.2)

## 2012-02-26 LAB — MRSA PCR SCREENING: MRSA by PCR: NEGATIVE

## 2012-02-26 SURGERY — LAPAROSCOPIC CHOLECYSTECTOMY WITH INTRAOPERATIVE CHOLANGIOGRAM
Anesthesia: General | Site: Abdomen | Wound class: Contaminated

## 2012-02-26 MED ORDER — LACTATED RINGERS IV SOLN
INTRAVENOUS | Status: DC | PRN
  Administered 2012-02-26: 12:00:00 via INTRAVENOUS

## 2012-02-26 MED ORDER — BUPIVACAINE HCL (PF) 0.25 % IJ SOLN
INTRAMUSCULAR | Status: AC
  Filled 2012-02-26: qty 30

## 2012-02-26 MED ORDER — LIDOCAINE-EPINEPHRINE (PF) 1 %-1:200000 IJ SOLN
INTRAMUSCULAR | Status: AC
  Filled 2012-02-26: qty 10

## 2012-02-26 MED ORDER — PROMETHAZINE HCL 25 MG/ML IJ SOLN: 6.2500 mg | INTRAMUSCULAR | Status: DC | PRN

## 2012-02-26 MED ORDER — OXYCODONE HCL 5 MG PO TABS: 5.0000 mg | ORAL_TABLET | Freq: Once | ORAL | Status: AC | PRN

## 2012-02-26 MED ORDER — 0.9 % SODIUM CHLORIDE (POUR BTL) OPTIME
TOPICAL | Status: DC | PRN
  Administered 2012-02-26: 1000 mL

## 2012-02-26 MED ORDER — HYDROMORPHONE HCL PF 1 MG/ML IJ SOLN
0.2500 mg | INTRAMUSCULAR | Status: DC | PRN
  Administered 2012-02-26: 0.5 mg via INTRAVENOUS
  Filled 2012-02-26: qty 1

## 2012-02-26 MED ORDER — OXYCODONE HCL 5 MG/5ML PO SOLN: 5.0000 mg | Freq: Once | ORAL | Status: AC | PRN

## 2012-02-26 MED ORDER — LIDOCAINE-EPINEPHRINE (PF) 1 %-1:200000 IJ SOLN
INTRAMUSCULAR | Status: DC | PRN
  Administered 2012-02-26: 13:00:00

## 2012-02-26 MED ORDER — MIDAZOLAM HCL 2 MG/2ML IJ SOLN: 1.0000 mg | INTRAMUSCULAR | Status: DC | PRN

## 2012-02-26 MED ORDER — FENTANYL CITRATE 0.05 MG/ML IJ SOLN
INTRAMUSCULAR | Status: DC | PRN
  Administered 2012-02-26: 100 ug via INTRAVENOUS
  Administered 2012-02-26: 50 ug via INTRAVENOUS

## 2012-02-26 MED ORDER — FENTANYL CITRATE 0.05 MG/ML IJ SOLN: 50.0000 ug | INTRAMUSCULAR | Status: DC | PRN

## 2012-02-26 MED ORDER — PROPOFOL 10 MG/ML IV EMUL: INTRAVENOUS | Status: DC | PRN

## 2012-02-26 MED ORDER — SODIUM CHLORIDE 0.9 % IR SOLN
Status: DC | PRN
  Administered 2012-02-26: 3000 mL

## 2012-02-26 MED ORDER — SODIUM CHLORIDE 0.9 % IR SOLN
Status: DC | PRN
  Administered 2012-02-26: 1000 mL

## 2012-02-26 MED ORDER — PROPOFOL 10 MG/ML IV EMUL
INTRAVENOUS | Status: DC | PRN
  Administered 2012-02-26: 50 mg via INTRAVENOUS
  Administered 2012-02-26: 30 mg via INTRAVENOUS
  Administered 2012-02-26: 120 mg via INTRAVENOUS

## 2012-02-26 MED ORDER — GLYCOPYRROLATE 0.2 MG/ML IJ SOLN
INTRAMUSCULAR | Status: DC | PRN
  Administered 2012-02-26: 0.4 mg via INTRAVENOUS

## 2012-02-26 MED ORDER — SUCCINYLCHOLINE CHLORIDE 20 MG/ML IJ SOLN: INTRAMUSCULAR | Status: DC | PRN

## 2012-02-26 MED ORDER — LIDOCAINE HCL (CARDIAC) 20 MG/ML IV SOLN
INTRAVENOUS | Status: DC | PRN
  Administered 2012-02-26: 100 mg via INTRAVENOUS

## 2012-02-26 MED ORDER — PROPOFOL 10 MG/ML IV BOLUS
INTRAVENOUS | Status: DC | PRN
  Administered 2012-02-26: 140 mg via INTRAVENOUS

## 2012-02-26 MED ORDER — ONDANSETRON HCL 4 MG/2ML IJ SOLN
INTRAMUSCULAR | Status: DC | PRN
  Administered 2012-02-26: 4 mg via INTRAVENOUS

## 2012-02-26 MED ORDER — ROCURONIUM BROMIDE 100 MG/10ML IV SOLN
INTRAVENOUS | Status: DC | PRN
  Administered 2012-02-26: 50 mg via INTRAVENOUS
  Administered 2012-02-26: 5 mg via INTRAVENOUS

## 2012-02-26 MED ORDER — SODIUM CHLORIDE 0.9 % IV SOLN
INTRAVENOUS | Status: DC | PRN
  Administered 2012-02-26: 13:00:00

## 2012-02-26 MED ORDER — HYDROMORPHONE HCL PF 1 MG/ML IJ SOLN
0.2500 mg | INTRAMUSCULAR | Status: DC | PRN
  Administered 2012-02-26 (×2): 0.5 mg via INTRAVENOUS

## 2012-02-26 MED ORDER — NEOSTIGMINE METHYLSULFATE 1 MG/ML IJ SOLN
INTRAMUSCULAR | Status: DC | PRN
  Administered 2012-02-26: 3 mg via INTRAVENOUS

## 2012-02-26 MED ORDER — BUPIVACAINE-EPINEPHRINE PF 0.25-1:200000 % IJ SOLN
INTRAMUSCULAR | Status: AC
  Filled 2012-02-26: qty 30

## 2012-02-26 SURGICAL SUPPLY — 55 items
ADH SKN CLS APL DERMABOND .7 (GAUZE/BANDAGES/DRESSINGS)
ADH SKN CLS LQ APL DERMABOND (GAUZE/BANDAGES/DRESSINGS) ×1
APPLIER CLIP ROT 10 11.4 M/L (STAPLE) ×2
APR CLP MED LRG 11.4X10 (STAPLE) ×1
BAG SPEC RTRVL LRG 6X4 10 (ENDOMECHANICALS) ×1
BLADE SURG ROTATE 9660 (MISCELLANEOUS) IMPLANT
CANISTER SUCTION 2500CC (MISCELLANEOUS) ×2 IMPLANT
CATH REDDICK CHOLANGI 4FR 50CM (CATHETERS) ×2 IMPLANT
CHLORAPREP W/TINT 26ML (MISCELLANEOUS) ×2 IMPLANT
CLIP APPLIE ROT 10 11.4 M/L (STAPLE) ×1 IMPLANT
CLOTH BEACON ORANGE TIMEOUT ST (SAFETY) ×2 IMPLANT
COVER SURGICAL LIGHT HANDLE (MISCELLANEOUS) ×2 IMPLANT
DECANTER SPIKE VIAL GLASS SM (MISCELLANEOUS) ×4 IMPLANT
DERMABOND ADHESIVE PROPEN (GAUZE/BANDAGES/DRESSINGS) ×1
DERMABOND ADVANCED (GAUZE/BANDAGES/DRESSINGS)
DERMABOND ADVANCED .7 DNX12 (GAUZE/BANDAGES/DRESSINGS) ×1 IMPLANT
DERMABOND ADVANCED .7 DNX6 (GAUZE/BANDAGES/DRESSINGS) IMPLANT
DRAIN CHANNEL 19F RND (DRAIN) ×1 IMPLANT
DRAPE C-ARM 42X72 X-RAY (DRAPES) ×2 IMPLANT
ELECT CAUTERY BLADE 6.4 (BLADE) ×2 IMPLANT
ELECT REM PT RETURN 9FT ADLT (ELECTROSURGICAL) ×2
ELECTRODE REM PT RTRN 9FT ADLT (ELECTROSURGICAL) ×1 IMPLANT
ENDOLOOP SUT PDS II  0 18 (SUTURE) ×1
ENDOLOOP SUT PDS II 0 18 (SUTURE) IMPLANT
EVACUATOR SILICONE 100CC (DRAIN) ×1 IMPLANT
GLOVE BIOGEL PI IND STRL 7.0 (GLOVE) IMPLANT
GLOVE BIOGEL PI INDICATOR 7.0 (GLOVE) ×2
GLOVE ECLIPSE 6.5 STRL STRAW (GLOVE) ×1 IMPLANT
GLOVE SURG SS PI 7.0 STRL IVOR (GLOVE) ×1 IMPLANT
GLOVE SURG SS PI 7.5 STRL IVOR (GLOVE) ×4 IMPLANT
GOWN PREVENTION PLUS XLARGE (GOWN DISPOSABLE) ×2 IMPLANT
GOWN STRL NON-REIN LRG LVL3 (GOWN DISPOSABLE) ×5 IMPLANT
IV CATH 14GX2 1/4 (CATHETERS) ×2 IMPLANT
KIT BASIN OR (CUSTOM PROCEDURE TRAY) ×2 IMPLANT
KIT ROOM TURNOVER OR (KITS) ×2 IMPLANT
NS IRRIG 1000ML POUR BTL (IV SOLUTION) ×2 IMPLANT
PAD ARMBOARD 7.5X6 YLW CONV (MISCELLANEOUS) ×2 IMPLANT
PENCIL BUTTON HOLSTER BLD 10FT (ELECTRODE) ×2 IMPLANT
POUCH SPECIMEN RETRIEVAL 10MM (ENDOMECHANICALS) ×2 IMPLANT
SCISSORS LAP 5X35 DISP (ENDOMECHANICALS) ×1 IMPLANT
SET IRRIG TUBING LAPAROSCOPIC (IRRIGATION / IRRIGATOR) ×2 IMPLANT
SLEEVE ENDOPATH XCEL 5M (ENDOMECHANICALS) ×2 IMPLANT
SPECIMEN JAR SMALL (MISCELLANEOUS) ×2 IMPLANT
SPONGE GAUZE 4X4 12PLY (GAUZE/BANDAGES/DRESSINGS) ×1 IMPLANT
SUT ETHILON 2 0 FS 18 (SUTURE) ×1 IMPLANT
SUT MNCRL AB 4-0 PS2 18 (SUTURE) ×4 IMPLANT
SUT VICRYL 0 UR6 27IN ABS (SUTURE) ×2 IMPLANT
TAPE CLOTH SURG 6X10 WHT LF (GAUZE/BANDAGES/DRESSINGS) ×1 IMPLANT
TOWEL OR 17X24 6PK STRL BLUE (TOWEL DISPOSABLE) ×2 IMPLANT
TOWEL OR 17X26 10 PK STRL BLUE (TOWEL DISPOSABLE) ×2 IMPLANT
TRAY FOLEY CATH 14FR (SET/KITS/TRAYS/PACK) ×1 IMPLANT
TRAY LAPAROSCOPIC (CUSTOM PROCEDURE TRAY) ×2 IMPLANT
TROCAR BALLN 12MMX100 BLUNT (TROCAR) ×2 IMPLANT
TROCAR XCEL NON-BLD 11X100MML (ENDOMECHANICALS) ×2 IMPLANT
TROCAR XCEL NON-BLD 5MMX100MML (ENDOMECHANICALS) ×2 IMPLANT

## 2012-02-26 NOTE — Anesthesia Postprocedure Evaluation (Signed)
  Anesthesia Post-op Note  Patient: Zachary Decker  Procedure(s) Performed: Procedure(s) (LRB) with comments: LAPAROSCOPIC CHOLECYSTECTOMY WITH INTRAOPERATIVE CHOLANGIOGRAM (N/A)  Patient Location: PACU  Anesthesia Type: General  Level of Consciousness: awake  Airway and Oxygen Therapy: Patient Spontanous Breathing  Post-op Pain: mild  Post-op Assessment: Post-op Vital signs reviewed, Patient's Cardiovascular Status Stable, Respiratory Function Stable, Patent Airway, No signs of Nausea or vomiting and Pain level controlled  Post-op Vital Signs: stable  Complications: No apparent anesthesia complications

## 2012-02-26 NOTE — Progress Notes (Signed)
[facility] PROGRESS NOTE  SHAHEED SCHMUCK MWU:[id number] DOB: 05-11-1933 DOA: 02/24/2012 PCP: Carrie Mew, MD  Assessment/Plan: Principal Problem:  *Abdominal pain Active Problems:  HYPOTHYROIDISM  HYPERCHOLESTEROLEMIA  HYPERTENSION  CAD, ARTERY BYPASS GRAFT  GERD  Diverticulitis large intestine  Leukocytosis  Renal failure, acute  1. Abdominal pain: Abnormal gallbladder on CT abdomen concerning for acute cholecystitis. Surgical consultation appreciated. Improved and patient has intermittent epigastric discomfort. HIDA scan suggests stone blocking cystic duct. No features of acute diverticulitis on CT. White cell count decreasing but clinically benign abdomen. continue empiric IV antibiotics. Awaiting laparoscopic cholecystectomy by surgery 2. Leukocytosis:? Secondary to abdominal process. continue empiric IV Cipro and Flagyl. 3. Acute renal failure:? Secondary to dehydration and recent contrast. Increased IV fluids and follow BMP. Temporarily hold lisinopril. Creatinine is about the same as yesterday. Continue IV fluids and follow up BMP in a.m. 4. Hypertension: Controlled. Continue carvedilol and temporarily hold lisinopril secondary to increased creatinine. 5. History of CAD, status post CABG 3-1/2 years ago. Denies chest pain. Multiple troponins negative. 6. GERD: PPI. 7. Diverticulosis: CT abdomen did not show diverticulitis. On empiric IV Cipro and Flagyl. 8. Hypothyroidism  Code Status: Full Family Communication: Patient declined MDs offered to discuss with family.  Disposition Plan: Based on surgery recommendations.   Brief narrative: 78 y.o. male with hx of CAD s/p prior MI and CABG, PVD, HTN, Hypercholesterolemia, GERD, presents to the ER with substernal chest burning for a few days. He said he took the Nexium and it was helpful. He denied any SOB, pleuritic pain nor any radiation of his pain. He took some Peptobismol, but it wasn't helpful. He therefore  presents to the ER. In the ER, his chest pain resolved completely, but he started complaining of LLQ pain. Evaluation in the ER showed unremarkable EKG, negative troponin, but he has mild leukocytosis. He subsequently had a abd/pelvic CT to exclude diverticulitis. It showed diverticulosis without diverticulitis, and large stone with pericholic edema suggestive of acute cholecystitis.   Consultants:  General surgery.   Procedures:  None   Antibiotics:  IV Cipro and Flagyl 9/3  HPI/Subjective: Patient complains of intermittent epigastric discomfort but no pain. No nausea or vomiting. Denies chest pain, dyspnea or palpitations.  Objective: Filed Vitals:   02/26/12 1507 02/26/12 1515 02/26/12 1522 02/26/12 1542  BP: 131/74  121/71 131/77  Pulse: 76 71 72 74  Temp:   97.6 F (36.4 C) 98.2 F (36.8 C)  TempSrc:      Resp: 14 13 14 20   Height:      Weight:      SpO2: 97% 96% 96% 94%    Intake/Output Summary (Last 24 hours) at 02/26/12 1948 Last data filed at 02/26/12 1800  Gross per 24 hour  Intake   1800 ml  Output    410 ml  Net   1390 ml   Filed Weights   02/24/12 0925 02/24/12 2058 02/25/12 2332  Weight: 87.9 kg (193 lb 12.6 oz) 86.9 kg (191 lb 9.3 oz) 87.68 kg (193 lb 4.8 oz)    Exam: General exam: Moderately built and nourished male lying comfortably in bed. Respiratory system: Clear to auscultation. No increased work of breathing. Cardiovascular system: First and second heart sounds heard, regular rate and rhythm. No JVD, murmurs or pedal edema. Gastrointestinal system: Abdomen is nondistended, soft and nontender. Normal bowel sounds heard. No organomegaly or masses appreciated. Central nervous system: Alert and oriented x3. No focal neurological deficits. Extremities: Symmetric 5 x 5 power.  Data Reviewed: Basic Metabolic Panel:  Lab 02/26/12 9604 02/25/12 0620 02/24/12 0933 02/24/12 0029  NA 136 134* -- 134*  K 4.6 4.5 -- 5.3*  CL 104 100 -- 99  CO2 25  24 -- 27  GLUCOSE 103* 119* -- 121*  BUN 25* 26* -- 23  CREATININE 1.48* 1.52* 1.12 1.20  CALCIUM 8.8 9.3 -- 10.1  MG -- -- -- --  PHOS -- -- -- --   Liver Function Tests:  Lab 02/26/12 0640 02/25/12 0620 02/24/12 0507  AST 36 26 27  ALT 15 16 18   ALKPHOS 49 50 59  BILITOT 0.4 0.6 0.4  PROT 5.9* 6.6 7.0  ALBUMIN 2.9* 3.3* 3.8    Lab 02/24/12 0029  LIPASE 34  AMYLASE --   No results found for this basename: AMMONIA:5 in the last 168 hours CBC:  Lab 02/26/12 0640 02/25/12 0620 02/24/12 0933 02/24/12 0029  WBC 16.1* 24.1* 17.9* 16.4*  NEUTROABS 11.9* -- -- 11.2*  HGB 12.0* 13.1 13.8 15.0  HCT 34.5* 38.0* 40.1 42.5  MCV 97.2 97.2 96.6 95.9  PLT 195 198 227 249   Cardiac Enzymes:  Lab 02/24/12 2040 02/24/12 1415 02/24/12 0932 02/24/12 0507  CKTOTAL -- -- -- --  CKMB -- -- -- --  CKMBINDEX -- -- -- --  TROPONINI <0.30 <0.30 <0.30 <0.30   BNP (last 3 results) No results found for this basename: PROBNP:3 in the last 8760 hours CBG: No results found for this basename: GLUCAP:5 in the last 168 hours  Recent Results (from the past 240 hour(s))  MRSA PCR SCREENING     Status: Normal   Collection Time   02/26/12 10:34 AM      Component Value Range Status Comment   MRSA by PCR NEGATIVE  NEGATIVE Final      Studies: US Abdomen Complete  02/24/2012  *RADIOLOGY REPORT*  Clinical Data:  Abdominal pain  ABDOMINAL ULTRASOUND COMPLETE  Comparison:  CT earlier today  Findings:  Gallbladder:  Multiple gallstones.  The largest is 2.3 cm. Significant gallbladder wall thickening measures up to 7 mm.  The gallbladder wall is edematous.  No pericholecystic fluid.  No Murphy's sign.  Sludge is noted.  Common Bile Duct:  Within normal limits in caliber.  Liver: No focal mass lesion identified.  Within normal limits in parenchymal echogenicity.  IVC:  Appears normal.  Pancreas:  Obscured by overlying bowel gas.  Spleen:  Within normal limits in size and echotexture.  Right kidney:  Normal in  size.  Cortical thinning is noted.  No mass or hydronephrosis.  Left kidney:  Normal in size.  No hydronephrosis or solid mass.  Abdominal Aorta:  Partially obscured.  Maximal visualized caliber is 2.9 cm.  IMPRESSION: Study is limited as described.  Cholelithiasis.  There is significant gallbladder wall thickening and edema but no sonographic Murphy's sign.  Correlate clinically as for the need for nuclear medicine imaging to evaluate for acute cholecystitis.   Original Report Authenticated By: Donavan Burnet, M.D.    Ct Abdomen Pelvis W Contrast  02/24/2012  *RADIOLOGY REPORT*  Clinical Data: Left side abdominal pain and chest pain, question diverticulitis, history coronary artery disease post MI, hypertension, GERD  CT ABDOMEN AND PELVIS WITH CONTRAST  Technique:  Multidetector CT imaging of the abdomen and pelvis was performed following the standard protocol during bolus administration of intravenous contrast. Sagittal and coronal MPR images reconstructed from axial data set.  Contrast: OMNIPAQUE IOHEXOL 300 MG/ML  SOLN Dilute  oral contrast.  Comparison: None  Findings: Partial atelectasis of the right lower lobe. Herniation of a portion of right middle lobe through an anterior chest wall defect. 2.5 cm diameter gallstone within gallbladder. Gallbladder appears mildly distended with question minimal pericholecystic edema. No biliary dilatation. Tiny cyst upper pole left kidney. Liver, spleen, pancreas, kidneys, and adrenal glands otherwise normal appearance. Incidental note of a circumaortic left renal vein.  Scattered stool throughout colon with note of uncomplicated diverticula of the descending and sigmoid colon. No definite evidence of acute diverticulitis. Stomach and small bowel loops unremarkable. Normal appendix. Tiny right side bladder diverticulum at/near ureteral orifice. No mass, adenopathy, free fluid or inflammatory process. Scattered degenerative changes of the lumbar spine.  IMPRESSION:  Descending and sigmoid colonic diverticulosis without evidence of diverticulitis. Cholelithiasis with question minimal pericholecystic edema; if there is clinical concern for acute cholecystitis, recommend abdominal ultrasound. Herniation of a small portion of the anterior right middle lobe through a chest wall defect at the anterior lower chest. Tiny bladder diverticulum. No other intra abdominal or intrapelvic abnormalities identified.   Original Report Authenticated By: Lollie Marrow, M.D.    Dg Abd Acute W/chest  02/24/2012  *RADIOLOGY REPORT*  Clinical Data: Left upper quadrant pain, chest pain, shortness of breath, history CABG  ACUTE ABDOMEN SERIES (ABDOMEN 2 VIEW & CHEST 1 VIEW)  Comparison: Chest radiograph 10/29/2009, abdominal radiograph 10/17/2008  Findings: Normal heart size post CABG. Atherosclerotic calcification thoracic aorta. Mediastinal contours and pulmonary vascularity normal. Chronic elevation of right diaphragm with blunting of right costophrenic angle. No acute infiltrate, pleural effusion or pneumothorax. Normal bowel gas pattern. No bowel dilatation, bowel wall thickening or free intraperitoneal air. Multilevel endplate spur formation and disc space narrowing lumbar spine. Diffuse osseous demineralization. No acute osseous findings.  IMPRESSION: Chronic right basilar lung changes/scarring. No acute abdominal findings.   Original Report Authenticated By: Lollie Marrow, M.D.     Scheduled Meds:    . aspirin EC  81 mg Oral Daily  . atorvastatin  10 mg Oral q1800  . carvedilol  3.125 mg Oral BID WC  . ciprofloxacin  400 mg Intravenous Q12H  . docusate sodium  100 mg Oral BID  . famotidine  20 mg Oral BID  . heparin  5,000 Units Subcutaneous Q8H  . levothyroxine  50 mcg Oral QAC breakfast  . lisinopril  10 mg Oral Daily  . metronidazole  500 mg Intravenous Q8H  . Tamsulosin HCl  0.4 mg Oral QPC supper   Continuous Infusions:    . dextrose 5 % and 0.9% NaCl 100 mL (02/25/12  1538)    Time spent: 30 minutes    [facility]  [facility] Pager [phone]. If 8PM-8AM, please contact night-coverage at [email or web address], password [facility] 02/26/2012, 7:48 PM  LOS: 2 days

## 2012-02-26 NOTE — Anesthesia Procedure Notes (Signed)
Performed by: Fuad Forget E     

## 2012-02-26 NOTE — Anesthesia Preprocedure Evaluation (Signed)
Anesthesia Evaluation  Patient identified by MRN, date of birth, ID band Patient awake    Reviewed: Allergy & Precautions, H&P , NPO status , Patient's Chart, lab work & pertinent test results, reviewed documented beta blocker date and time   Airway Mallampati: II TM Distance: >3 FB Neck ROM: Full    Dental No notable dental hx. (+) Teeth Intact and Dental Advisory Given   Pulmonary neg pulmonary ROS,  breath sounds clear to auscultation  Pulmonary exam normal       Cardiovascular hypertension, On Medications and On Home Beta Blockers + CAD and + Past MI Rhythm:Regular Rate:Normal     Neuro/Psych negative neurological ROS  negative psych ROS   GI/Hepatic negative GI ROS, Neg liver ROS,   Endo/Other  negative endocrine ROS  Renal/GU negative Renal ROS  negative genitourinary   Musculoskeletal   Abdominal   Peds  Hematology negative hematology ROS (+)   Anesthesia Other Findings   Reproductive/Obstetrics negative OB ROS                           Anesthesia Physical Anesthesia Plan  ASA: III  Anesthesia Plan: General   Post-op Pain Management:    Induction: Intravenous  Airway Management Planned: Oral ETT  Additional Equipment:   Intra-op Plan:   Post-operative Plan: Extubation in OR  Informed Consent: I have reviewed the patients History and Physical, chart, labs and discussed the procedure including the risks, benefits and alternatives for the proposed anesthesia with the patient or authorized representative who has indicated his/her understanding and acceptance.   Dental advisory given  Plan Discussed with: CRNA  Anesthesia Plan Comments:         Anesthesia Quick Evaluation

## 2012-02-26 NOTE — Preoperative (Signed)
Beta Blockers   Reason not to administer Beta Blockers:Not Applicable 

## 2012-02-26 NOTE — Progress Notes (Signed)
Report given to phillip rn as caregiver 

## 2012-02-26 NOTE — Progress Notes (Signed)
Patient ID: Zachary Decker, male   DOB: 1933/06/08, 78 y.o.   MRN: [id number]    Subjective: No complaints, ready for surgery, no questions.  Objective: Vital signs in last 24 hours: Temp:  [97.3 F (36.3 C)-98.1 F (36.7 C)] 98.1 F (36.7 C) (09/05 0552) Pulse Rate:  [66-81] 80  (09/05 0552) Resp:  [17-18] 18  (09/05 0552) BP: (99-121)/(60-80) 121/80 mmHg (09/05 0552) SpO2:  [92 %-94 %] 94 % (09/05 0552) Weight:  [193 lb 4.8 oz (87.68 kg)] 193 lb 4.8 oz (87.68 kg) (09/04 2332) Last BM Date: 02/25/12  Intake/Output from previous day: 09/04 0701 - 09/05 0700 In: 0  Out: 200 [Urine:200] Intake/Output this shift:   Gen: NAD. Awake and alert Abd: soft, +BS, no focal tenderness.  Lab Results:   Basename 02/26/12 0640 02/25/12 0620  WBC 16.1* 24.1*  HGB 12.0* 13.1  HCT 34.5* 38.0*  PLT 195 198   BMET  Basename 02/25/12 0620 02/24/12 0933 02/24/12 0029  NA 134* -- 134*  K 4.5 -- 5.3*  CL 100 -- 99  CO2 24 -- 27  GLUCOSE 119* -- 121*  BUN 26* -- 23  CREATININE 1.52* 1.12 --  CALCIUM 9.3 -- 10.1   Studies/Results: Nm Hepatobiliary Liver Func  02/25/2012  *RADIOLOGY REPORT*  Clinical Data: Abdominal pain.  Gallstones.  NUCLEAR MEDICINE HEPATOHBILIARY INCLUDE GB  Radiopharmaceutical:  5 mCi Choletec initially followed by 2 mCi Choletec at 85 minutes.  Comparison: 02/24/2012 CT and ultrasound  Findings: Homogeneous uptake by the liver.  Small bowel visualized by 9 minutes.  Gallbladder not visualized in the first hour.  Second dose of Choletec with nonvisualization of the gallbladder by 2 hours.  It was elected by initial radiologist not to administer Morphine at this point.  The patient returned for 4 and 1/2 hour delayed imaging.  No gallbladder visualized.  IMPRESSION: Gallbladder not visualized.  Findings suggestive of cystic duct obstruction.  Clinical correlation recommended.   Original Report Authenticated By: Fuller Canada, M.D.    US Abdomen Complete  02/24/2012   *RADIOLOGY REPORT*  Clinical Data:  Abdominal pain  ABDOMINAL ULTRASOUND COMPLETE  Comparison:  CT earlier today  Findings:  Gallbladder:  Multiple gallstones.  The largest is 2.3 cm. Significant gallbladder wall thickening measures up to 7 mm.  The gallbladder wall is edematous.  No pericholecystic fluid.  No Murphy's sign.  Sludge is noted.  Common Bile Duct:  Within normal limits in caliber.  Liver: No focal mass lesion identified.  Within normal limits in parenchymal echogenicity.  IVC:  Appears normal.  Pancreas:  Obscured by overlying bowel gas.  Spleen:  Within normal limits in size and echotexture.  Right kidney:  Normal in size.  Cortical thinning is noted.  No mass or hydronephrosis.  Left kidney:  Normal in size.  No hydronephrosis or solid mass.  Abdominal Aorta:  Partially obscured.  Maximal visualized caliber is 2.9 cm.  IMPRESSION: Study is limited as described.  Cholelithiasis.  There is significant gallbladder wall thickening and edema but no sonographic Murphy's sign.  Correlate clinically as for the need for nuclear medicine imaging to evaluate for acute cholecystitis.   Original Report Authenticated By: Donavan Burnet, M.D.    Anti-infectives: Anti-infectives     Start     Dose/Rate Route Frequency Ordered Stop   02/24/12 1000   metroNIDAZOLE (FLAGYL) IVPB 500 mg        500 mg 100 mL/hr over 60 Minutes Intravenous Every 8 hours  02/24/12 0847     02/24/12 0700   ciprofloxacin (CIPRO) IVPB 400 mg        400 mg 200 mL/hr over 60 Minutes Intravenous Every 12 hours 02/24/12 0647            Assessment/Plan: 1.  Acute Cholecystitis: HIDA suggest cholecystitis, Dr. Biagio Quint recommends laparoscopic cholecystectomy with intraoperative cholangiogram, RBA discussed, to OR today.  WBC trending down, recheck tomorrow.     LOS: 2 days    WHITE, ELIZABETH 02/26/2012 Given imaging and hx will plan for cholecystectomy.  The risks of infection, bleeding, pain, persistent symptoms, scarring,  injury to bowel or bile ducts, retained stone, diarrhea, need for additional procedures, and need for open surgery discussed with the patient.

## 2012-02-26 NOTE — Op Note (Signed)
NAMEMELECIO, CUETO               ACCOUNT NO.:  [id number]  MEDICAL RECORD NO.:  [id number]  LOCATION:  6707                         FACILITY:  [facility]  PHYSICIAN:  Lodema Pilot, MD       DATE OF BIRTH:  07-15-1932  DATE OF PROCEDURE:  02/26/2012 DATE OF DISCHARGE:                              OPERATIVE REPORT   PROCEDURE:  Laparoscopic cholecystectomy with intraoperative cholangiogram.  PREOPERATIVE DIAGNOSIS:  Acute cholecystitis.  POSTOPERATIVE DIAGNOSIS:  Acute cholecystitis.  SURGEON:  Lodema Pilot, MD  ASSISTANT:  None.  ANESTHESIA:  General endotracheal anesthesia with 25 mL of 1% lidocaine with epinephrine and 0.25% Marcaine in a 50:50 mixture.  FLUIDS:  750 mL crystalloid.  ESTIMATED BLOOD LOSS:  150 mL.  DRAINS:  A 19-French Blake drain placed in the gallbladder fossa.  SPECIMENS:  Gallbladder and contents sent to Pathology for permanent sectioning.  COMPLICATIONS:  None apparent.  FINDINGS:  Very thickened rind around the gallbladder with necrotic and green wall of the gallbladder.  Normal cholangiogram.  INDICATION FOR PROCEDURE:  [name] is a 78 year old male who came in with left lower quadrant pain and on CT scan had thickened gallbladder. He had ultrasound concerning for cholecystitis as well, but he was clinically feeling well.  We obtained a HIDA scan which demonstrated nonfilling of the gallbladder and we elected for cholecystectomy.  OPERATIVE DETAILS:  [name] was seen and evaluated in the surgical ward and risks and benefits of procedure were discussed in lay terms. Informed consent was obtained.  He was already on therapeutic antibiotics and taken to the operating room, placed on the table in supine position.  General endotracheal anesthesia obtained.  Foley catheter was placed and his abdomen was prepped and draped in a standard surgical fashion.  Procedure time-out was performed with all operative team members confirmed proper  patient and procedure and a supraumbilical midline incision was made in the skin and dissection carried down to the abdominal wall fascia using blunt dissection.  The fascia was elevated and sharply incised and the peritoneum entered bluntly.  A 12-mm balloon port was placed and laparoscope was introduced and there was no evidence of bowel injury upon entry.  An 11-mm epigastric trocar was placed.  The colon and omentum were adhered up to the gallbladder consistent with inflammatory process.  At took the colon and omentum off of the gallbladder using blunt dissection.  No cautery was used for the adhesiolysis as I pulled back the  omentum and colon.  I was able to identify green patches of ischemia on the gallbladder consistent with acute cholecystitis.  I decompressed the gallbladder in order to able to retract the gallbladder cephalad and continued takedown of the adhesions bluntly.  I was able to identify the infundibulum of the gallbladder and the cystic duct.  I peeled back the rind.  This revealed the wall of the gallbladder.  The cystic artery was located in its usual anatomic position and this was divided between hemoclips.  This allowed me to further open up the triangle of Calot.  I was able to obtain view of the, what appeared to be a, single cystic duct entering the gallbladder and  a clip was placed on the gallbladder side and a cholangiogram was performed.  This demonstrated normal flow of bile into the duodenum, normal common bile duct and right and left hepatic ducts, and confirmed anatomy.  The catheter was removed and clip was placed on the cystic duct stump and a 0 PDS Endoloop was placed under the clip for added security.  Then I dissected most of the gallbladder way from the gallbladder fossa using blunt dissection.  So I peeled this away from its rind.  Bovie electrocautery was used to remove the gallbladder from the gallbladder fossa and a hole was made in the back  wall of the gallbladder, but there was no spillage of stones.  He had basically large gallstones, pigmented stones, in the gallbladder, but they were not spilled.  The gallbladder was completely removed from the gallbladder fossa and placed in EndoCatch bag and removed from the enlarged umbilical trocar site.  It was passed off the table and sent to Pathology for permanent sectioning.  Hemostasis was obtained with Bovie electrocautery from the gallbladder fossa and the abdomen was irrigated with several liters of irrigation until the irrigation returned clear. The clips appeared to be in good position and the gallbladder fossa appeared to be hemostatic.  Because of the severe inflammatory changes and the necrotic gallbladder, I decided to leave a 19-French Blake drain in the gallbladder fossa and exited this through the right upper quadrant trocar site, sutured in place with 2-0 nylon drain stitch.  The right upper quadrant trocars removed.  Abdominal wall was noted to be hemostatic.  The umbilical trocar was removed and the abdominal wall fascia was closed in open fashion.  Sutures were secured and the abdomen was re-insufflated through the epigastric trocar site.  The abdominal wall closure was noted to be adequate and hemostatic.  There was no evidence of bowel injury.  No evidence of bile.  No evidence of bleeding.  Final trocar was removed and wounds were injected with 25 mL of 1% lidocaine with epinephrine and 0.25% Marcaine in a 50:50 mixture. Skin edges were approximated with Dermabond.  Skin was washed and dried and Dermabond was applied.  All sponge, needle, and instrument counts were correct at the end of the case.  The patient tolerated the procedure well without apparent complications.          ______________________________ Lodema Pilot, MD     BL/MEDQ  D:  02/26/2012  T:  02/26/2012  Job:  [id number]

## 2012-02-26 NOTE — Transfer of Care (Signed)
Immediate Anesthesia Transfer of Care Note  Patient: Zachary Decker  Procedure(s) Performed: Procedure(s) (LRB) with comments: LAPAROSCOPIC CHOLECYSTECTOMY WITH INTRAOPERATIVE CHOLANGIOGRAM (N/A)  Patient Location: PACU  Anesthesia Type: General  Level of Consciousness: awake, alert , oriented and sedated  Airway & Oxygen Therapy: Patient Spontanous Breathing and Patient connected to nasal cannula oxygen  Post-op Assessment: Report given to PACU RN, Post -op Vital signs reviewed and stable and Patient moving all extremities  Post vital signs: Reviewed and stable  Complications: No apparent anesthesia complications

## 2012-02-26 NOTE — Brief Op Note (Signed)
02/24/2012 - 02/26/2012  2:12 PM  PATIENT:  Zachary Decker  78 y.o. male  PRE-OPERATIVE DIAGNOSIS:  Acute Cholecystitis  POST-OPERATIVE DIAGNOSIS:  Acute Cholecystitis  PROCEDURE:  Procedure(s) (LRB) with comments: LAPAROSCOPIC CHOLECYSTECTOMY WITH INTRAOPERATIVE CHOLANGIOGRAM (N/A)  SURGEON:  Surgeon(s) and Role:    * Lodema Pilot, DO - Primary  PHYSICIAN ASSISTANT:   ASSISTANTS: none   ANESTHESIA:   general  EBL:  Total I/O In: 700 [I.V.:700] Out: 250 [Urine:250]  BLOOD ADMINISTERED:none  DRAINS: (66F) Jackson-Pratt drain(s) with closed bulb suction in the GB fossa   LOCAL MEDICATIONS USED:  MARCAINE    and LIDOCAINE   SPECIMEN:  Source of Specimen:  gallbladder  DISPOSITION OF SPECIMEN:  PATHOLOGY  COUNTS:  YES  TOURNIQUET:  * No tourniquets in log *  DICTATION: .Other Dictation: Dictation Number [id number]  PLAN OF CARE: Admit to inpatient   PATIENT DISPOSITION:  PACU - hemodynamically stable.   Delay start of Pharmacological VTE agent (>24hrs) due to surgical blood loss or risk of bleeding: no

## 2012-02-27 ENCOUNTER — Encounter (HOSPITAL_COMMUNITY): Payer: Self-pay | Admitting: General Surgery

## 2012-02-27 DIAGNOSIS — D62 Acute posthemorrhagic anemia: Secondary | ICD-10-CM | POA: Diagnosis not present

## 2012-02-27 DIAGNOSIS — R338 Other retention of urine: Secondary | ICD-10-CM | POA: Diagnosis not present

## 2012-02-27 DIAGNOSIS — R339 Retention of urine, unspecified: Secondary | ICD-10-CM | POA: Diagnosis not present

## 2012-02-27 DIAGNOSIS — K810 Acute cholecystitis: Secondary | ICD-10-CM

## 2012-02-27 LAB — CBC
HCT: 32 % — ABNORMAL LOW (ref 39.0–52.0)
MCHC: 34.1 g/dL (ref 30.0–36.0)
RDW: 12.2 % (ref 11.5–15.5)

## 2012-02-27 LAB — BASIC METABOLIC PANEL
BUN: 20 mg/dL (ref 6–23)
Calcium: 8.3 mg/dL — ABNORMAL LOW (ref 8.4–10.5)
Creatinine, Ser: 1.24 mg/dL (ref 0.50–1.35)
GFR calc Af Amer: 63 mL/min — ABNORMAL LOW (ref >90)
GFR calc non Af Amer: 55 mL/min — ABNORMAL LOW (ref >90)
Potassium: 3.8 mEq/L (ref 3.5–5.1)

## 2012-02-27 MED ORDER — LISINOPRIL 10 MG PO TABS
10.0000 mg | ORAL_TABLET | Freq: Every day | ORAL | Status: DC
  Filled 2012-02-27: qty 1

## 2012-02-27 NOTE — Progress Notes (Addendum)
[facility] PROGRESS NOTE  Zachary Decker WUJ:[id number] DOB: May 12, 1933 DOA: 02/24/2012 PCP: Carrie Mew, MD  Assessment/Plan: Principal Problem:  *Cholecystitis, acute Active Problems:  HYPOTHYROIDISM  HYPERCHOLESTEROLEMIA  HYPERTENSION  CAD, ARTERY BYPASS GRAFT  GERD  Abdominal pain  Diverticulitis large intestine  Leukocytosis  Renal failure, acute  Urinary retention  Anemia, posthemorrhagic, acute  1. Acute cholecystitis: Status post laparoscopic cholecystectomy on 9/5. Showed necrotic gallbladder. Still has the drain. Continue IV antibiotics for another 24 hours and then switched to by mouth. Management per surgery-input much appreciated. Leukocytosis improving. 2. Urinary retention: Likely secondary to pain and pain medications. Had in and out cath last night. Was able to void small amounts this morning. Monitor. 3. Acute blood loss anemia: 150 mL of EBL. Follow CBCs in a.m. Transfuse if hemoglobin less than 7 g/dL.  4. Acute renal failure:? Secondary to dehydration and recent contrast. Creatinine has improved. Continue IV fluids and hold lisinopril for additional day. Follow BMP in a.m. 5. Hypertension: Controlled. Continue carvedilol and temporarily hold lisinopril secondary to increased creatinine. 6. History of CAD, status post CABG 3-1/2 years ago. Denies chest pain. Multiple troponins negative. Stable. 7. GERD: PPI. 8. Diverticulosis: CT abdomen did not show diverticulitis. On empiric IV Cipro and Flagyl. 9. Hypothyroidism  Code Status: Full Family Communication: Patient has given permission to discuss his care with his spouse-we'll do so later today.  Disposition Plan: Home when stable and cleared by surgery,? Next 1-2 days   Brief narrative: 78 y.o. male with hx of CAD s/p prior MI and CABG, PVD, HTN, Hypercholesterolemia, GERD, presents to the ER with substernal chest burning for a few days. He said he took the Nexium and it was helpful. He denied  any SOB, pleuritic pain nor any radiation of his pain. He took some Peptobismol, but it wasn't helpful. He therefore presents to the ER. In the ER, his chest pain resolved completely, but he started complaining of LLQ pain. Evaluation in the ER showed unremarkable EKG, negative troponin, but he has mild leukocytosis. He subsequently had a abd/pelvic CT to exclude diverticulitis. It showed diverticulosis without diverticulitis, and large stone with pericholic edema suggestive of acute cholecystitis. Surgery evaluated and performed laparoscopic cholecystectomy on 9/5-confirming diagnosis of cholecystitis.   Consultants:  General surgery.   Procedures:  Laparoscopic cholecystectomy-9/5  Antibiotics:  IV Cipro and Flagyl 9/3  HPI/Subjective: Mild soreness at surgical site. Passing small amount of flatus. No nausea vomiting. No chest pain. Able to pass small amounts of urine this morning.  Objective: Filed Vitals:   02/26/12 1522 02/26/12 1542 02/26/12 2137 02/27/12 0527  BP: 121/71 131/77 114/68 145/72  Pulse: 72 74 69 90  Temp: 97.6 F (36.4 C) 98.2 F (36.8 C) 98.7 F (37.1 C)   TempSrc:   Oral   Resp: 14 20 18 18   Height:      Weight:   87.8 kg (193 lb 9 oz)   SpO2: 96% 94% 93% 94%    Intake/Output Summary (Last 24 hours) at 02/27/12 0850 Last data filed at 02/27/12 0611  Gross per 24 hour  Intake 3238.33 ml  Output    910 ml  Net 2328.33 ml   Filed Weights   02/24/12 2058 02/25/12 2332 02/26/12 2137  Weight: 86.9 kg (191 lb 9.3 oz) 87.68 kg (193 lb 4.8 oz) 87.8 kg (193 lb 9 oz)    Exam: General exam: Moderately built and nourished male sitting at edge of bed. Respiratory system: Clear to auscultation. No increased  work of breathing. Cardiovascular system: First and second heart sounds heard, regular rate and rhythm. No JVD, murmurs or pedal edema. Gastrointestinal system: Abdomen is nondistended, soft and nontender. Normal bowel sounds heard. Drain right upper  quadrant which has minimal amount of blood stained fluid. Central nervous system: Alert and oriented x3. No focal neurological deficits. Extremities: Symmetric 5 x 5 power.   Data Reviewed: Basic Metabolic Panel:  Lab 02/27/12 1191 02/26/12 0640 02/25/12 0620 02/24/12 0933 02/24/12 0029  NA 132* 136 134* -- 134*  K 3.8 4.6 4.5 -- 5.3*  CL 100 104 100 -- 99  CO2 22 25 24  -- 27  GLUCOSE 227* 103* 119* -- 121*  BUN 20 25* 26* -- 23  CREATININE 1.24 1.48* 1.52* 1.12 1.20  CALCIUM 8.3* 8.8 9.3 -- 10.1  MG -- -- -- -- --  PHOS -- -- -- -- --   Liver Function Tests:  Lab 02/26/12 0640 02/25/12 0620 02/24/12 0507  AST 36 26 27  ALT 15 16 18   ALKPHOS 49 50 59  BILITOT 0.4 0.6 0.4  PROT 5.9* 6.6 7.0  ALBUMIN 2.9* 3.3* 3.8    Lab 02/24/12 0029  LIPASE 34  AMYLASE --   No results found for this basename: AMMONIA:5 in the last 168 hours CBC:  Lab 02/27/12 0658 02/26/12 0640 02/25/12 0620 02/24/12 0933 02/24/12 0029  WBC 11.5* 16.1* 24.1* 17.9* 16.4*  NEUTROABS -- 11.9* -- -- 11.2*  HGB 10.9* 12.0* 13.1 13.8 15.0  HCT 32.0* 34.5* 38.0* 40.1 42.5  MCV 97.3 97.2 97.2 96.6 95.9  PLT 206 195 198 227 249   Cardiac Enzymes:  Lab 02/24/12 2040 02/24/12 1415 02/24/12 0932 02/24/12 0507  CKTOTAL -- -- -- --  CKMB -- -- -- --  CKMBINDEX -- -- -- --  TROPONINI <0.30 <0.30 <0.30 <0.30   BNP (last 3 results) No results found for this basename: PROBNP:3 in the last 8760 hours CBG: No results found for this basename: GLUCAP:5 in the last 168 hours  Recent Results (from the past 240 hour(s))  MRSA PCR SCREENING     Status: Normal   Collection Time   02/26/12 10:34 AM      Component Value Range Status Comment   MRSA by PCR NEGATIVE  NEGATIVE Final      Studies: US Abdomen Complete  02/24/2012  *RADIOLOGY REPORT*  Clinical Data:  Abdominal pain  ABDOMINAL ULTRASOUND COMPLETE  Comparison:  CT earlier today  Findings:  Gallbladder:  Multiple gallstones.  The largest is 2.3 cm.  Significant gallbladder wall thickening measures up to 7 mm.  The gallbladder wall is edematous.  No pericholecystic fluid.  No Murphy's sign.  Sludge is noted.  Common Bile Duct:  Within normal limits in caliber.  Liver: No focal mass lesion identified.  Within normal limits in parenchymal echogenicity.  IVC:  Appears normal.  Pancreas:  Obscured by overlying bowel gas.  Spleen:  Within normal limits in size and echotexture.  Right kidney:  Normal in size.  Cortical thinning is noted.  No mass or hydronephrosis.  Left kidney:  Normal in size.  No hydronephrosis or solid mass.  Abdominal Aorta:  Partially obscured.  Maximal visualized caliber is 2.9 cm.  IMPRESSION: Study is limited as described.  Cholelithiasis.  There is significant gallbladder wall thickening and edema but no sonographic Murphy's sign.  Correlate clinically as for the need for nuclear medicine imaging to evaluate for acute cholecystitis.   Original Report Authenticated By: Donavan Burnet,  M.D.    Ct Abdomen Pelvis W Contrast  02/24/2012  *RADIOLOGY REPORT*  Clinical Data: Left side abdominal pain and chest pain, question diverticulitis, history coronary artery disease post MI, hypertension, GERD  CT ABDOMEN AND PELVIS WITH CONTRAST  Technique:  Multidetector CT imaging of the abdomen and pelvis was performed following the standard protocol during bolus administration of intravenous contrast. Sagittal and coronal MPR images reconstructed from axial data set.  Contrast: OMNIPAQUE IOHEXOL 300 MG/ML  SOLN Dilute oral contrast.  Comparison: None  Findings: Partial atelectasis of the right lower lobe. Herniation of a portion of right middle lobe through an anterior chest wall defect. 2.5 cm diameter gallstone within gallbladder. Gallbladder appears mildly distended with question minimal pericholecystic edema. No biliary dilatation. Tiny cyst upper pole left kidney. Liver, spleen, pancreas, kidneys, and adrenal glands otherwise normal  appearance. Incidental note of a circumaortic left renal vein.  Scattered stool throughout colon with note of uncomplicated diverticula of the descending and sigmoid colon. No definite evidence of acute diverticulitis. Stomach and small bowel loops unremarkable. Normal appendix. Tiny right side bladder diverticulum at/near ureteral orifice. No mass, adenopathy, free fluid or inflammatory process. Scattered degenerative changes of the lumbar spine.  IMPRESSION: Descending and sigmoid colonic diverticulosis without evidence of diverticulitis. Cholelithiasis with question minimal pericholecystic edema; if there is clinical concern for acute cholecystitis, recommend abdominal ultrasound. Herniation of a small portion of the anterior right middle lobe through a chest wall defect at the anterior lower chest. Tiny bladder diverticulum. No other intra abdominal or intrapelvic abnormalities identified.   Original Report Authenticated By: Lollie Marrow, M.D.    Dg Abd Acute W/chest  02/24/2012  *RADIOLOGY REPORT*  Clinical Data: Left upper quadrant pain, chest pain, shortness of breath, history CABG  ACUTE ABDOMEN SERIES (ABDOMEN 2 VIEW & CHEST 1 VIEW)  Comparison: Chest radiograph 10/29/2009, abdominal radiograph 10/17/2008  Findings: Normal heart size post CABG. Atherosclerotic calcification thoracic aorta. Mediastinal contours and pulmonary vascularity normal. Chronic elevation of right diaphragm with blunting of right costophrenic angle. No acute infiltrate, pleural effusion or pneumothorax. Normal bowel gas pattern. No bowel dilatation, bowel wall thickening or free intraperitoneal air. Multilevel endplate spur formation and disc space narrowing lumbar spine. Diffuse osseous demineralization. No acute osseous findings.  IMPRESSION: Chronic right basilar lung changes/scarring. No acute abdominal findings.   Original Report Authenticated By: Lollie Marrow, M.D.     Scheduled Meds:    . aspirin EC  81 mg Oral Daily    . atorvastatin  10 mg Oral q1800  . carvedilol  3.125 mg Oral BID WC  . ciprofloxacin  400 mg Intravenous Q12H  . docusate sodium  100 mg Oral BID  . famotidine  20 mg Oral BID  . heparin  5,000 Units Subcutaneous Q8H  . levothyroxine  50 mcg Oral QAC breakfast  . lisinopril  10 mg Oral Daily  . metronidazole  500 mg Intravenous Q8H  . Tamsulosin HCl  0.4 mg Oral QPC supper  . DISCONTD: lisinopril  10 mg Oral Daily   Continuous Infusions:    . dextrose 5 % and 0.9% NaCl 100 mL/hr at 02/27/12 0610    Time spent: 30 minutes    [facility]  [facility] Pager [phone]. If 8PM-8AM, please contact night-coverage at [email or web address], password [facility] 02/27/2012, 8:50 AM  LOS: 3 days

## 2012-02-27 NOTE — Progress Notes (Signed)
Pt had a lap chole 02/26/12. Unable to void since after surgery. Bladder scanned for >450. Pt denies discomfort, but states he does feel like he has to void. MD on call notified. New orders received. I&O cathed for clear, amber urine. Pt tolerated well. Will continue to monitor. C.Glendoris Nodarse, RN.

## 2012-02-28 LAB — CBC
Hemoglobin: 11.2 g/dL — ABNORMAL LOW (ref 13.0–17.0)
MCHC: 34.3 g/dL (ref 30.0–36.0)
Platelets: 230 10*3/uL (ref 150–400)
RDW: 12.2 % (ref 11.5–15.5)

## 2012-02-28 LAB — COMPREHENSIVE METABOLIC PANEL
ALT: 23 U/L (ref 0–53)
AST: 38 U/L — ABNORMAL HIGH (ref 0–37)
Albumin: 2.8 g/dL — ABNORMAL LOW (ref 3.5–5.2)
Alkaline Phosphatase: 53 U/L (ref 39–117)
Potassium: 4.2 mEq/L (ref 3.5–5.1)
Sodium: 138 mEq/L (ref 135–145)
Total Protein: 5.8 g/dL — ABNORMAL LOW (ref 6.0–8.3)

## 2012-02-28 MED ORDER — CIPROFLOXACIN HCL 500 MG PO TABS
500.0000 mg | ORAL_TABLET | Freq: Two times a day (BID) | ORAL | Status: DC
  Administered 2012-02-28 – 2012-02-29 (×3): 500 mg via ORAL
  Filled 2012-02-28 (×5): qty 1

## 2012-02-28 MED ORDER — LISINOPRIL 10 MG PO TABS
10.0000 mg | ORAL_TABLET | Freq: Every day | ORAL | Status: DC
  Administered 2012-02-29: 10 mg via ORAL
  Filled 2012-02-28: qty 1

## 2012-02-28 MED ORDER — ACETAMINOPHEN 325 MG PO TABS
650.0000 mg | ORAL_TABLET | Freq: Four times a day (QID) | ORAL | Status: DC | PRN
  Administered 2012-02-28: 650 mg via ORAL
  Filled 2012-02-28: qty 2

## 2012-02-28 MED ORDER — METRONIDAZOLE 500 MG PO TABS
500.0000 mg | ORAL_TABLET | Freq: Three times a day (TID) | ORAL | Status: DC
  Administered 2012-02-28 – 2012-02-29 (×4): 500 mg via ORAL
  Filled 2012-02-28 (×6): qty 1

## 2012-02-28 MED ORDER — SODIUM CHLORIDE 0.9 % IJ SOLN
3.0000 mL | INTRAMUSCULAR | Status: DC | PRN
  Administered 2012-02-28: 3 mL via INTRAVENOUS

## 2012-02-28 NOTE — Progress Notes (Signed)
Pt given instructions on how to empty JP drain at home for possible D/C tomorrow, family at bedside, pt has verbalizes and demonstrated how to care for drain with understanding, will continue to monitor

## 2012-02-28 NOTE — Progress Notes (Addendum)
[facility] PROGRESS NOTE  Zachary Decker NFA:[id number] DOB: 05-13-1933 DOA: 02/24/2012 PCP: Carrie Mew, MD  Assessment/Plan: Principal Problem:  *Cholecystitis, acute Active Problems:  HYPOTHYROIDISM  HYPERCHOLESTEROLEMIA  HYPERTENSION  CAD, ARTERY BYPASS GRAFT  GERD  Abdominal pain  Diverticulitis large intestine  Leukocytosis  Renal failure, acute  Urinary retention  Anemia, posthemorrhagic, acute  1. Acute cholecystitis: Status post laparoscopic cholecystectomy on 9/5. Showed necrotic gallbladder. Still has the JP drain. Patient tolerating oral diet and hence we'll switch antibiotics orally. Awaiting surgical followup postop. Leukocytosis resolved 2. Urinary retention: Likely secondary to pain and pain medications complicating BPH. Had in and out cath x1 two nights ago. Voiding freely since then. 3. Acute blood loss anemia: 150 mL of EBL. Stable.  4. Acute renal failure:? Secondary to dehydration and recent contrast. Creatinine at baseline. Discontinue IV fluids but hold lisinopril. Resolved. 5. Hypertension: Controlled. Continue carvedilol and temporarily hold lisinopril secondary to recent acute renal failure. 6. History of CAD, status post CABG 3-1/2 years ago. Denies chest pain. Multiple troponins negative. Stable. 7. GERD: PPI. 8. Diverticulosis: CT abdomen did not show diverticulitis. On empiric IV Cipro and Flagyl. 9. Hypothyroidism  Code Status: Full Family Communication: Discussed with patient spouse on 9/6. Disposition Plan: Home when cleared by surgery.   Brief narrative: 78 y.o. male with hx of CAD s/p prior MI and CABG, PVD, HTN, Hypercholesterolemia, GERD, presents to the ER with substernal chest burning for a few days. He said he took the Nexium and it was helpful. He denied any SOB, pleuritic pain nor any radiation of his pain. He took some Peptobismol, but it wasn't helpful. He therefore presents to the ER. In the ER, his chest pain resolved  completely, but he started complaining of LLQ pain. Evaluation in the ER showed unremarkable EKG, negative troponin, but he has mild leukocytosis. He subsequently had a abd/pelvic CT to exclude diverticulitis. It showed diverticulosis without diverticulitis, and large stone with pericholic edema suggestive of acute cholecystitis. Surgery evaluated and performed laparoscopic cholecystectomy on 9/5-confirming diagnosis of cholecystitis.   Consultants:  General surgery.   Procedures:  Laparoscopic cholecystectomy-9/5  Antibiotics:  IV Cipro and Flagyl 9/3-9/7  By mouth Cipro and Flagyl 9/7  HPI/Subjective: Mild intermittent pain right upper quadrant 5/10, only on movements. Passing flatus. No BM. Tolerating solid food.  Objective: Filed Vitals:   02/27/12 1000 02/27/12 1350 02/27/12 2040 02/28/12 0550  BP: 123/64 119/68 141/70 123/82  Pulse: 65 68 65 64  Temp: 97.5 F (36.4 C)  97.3 F (36.3 C) 97.8 F (36.6 C)  TempSrc: Oral  Oral Oral  Resp: 20 20 18 18   Height:      Weight:   92.08 kg (203 lb)   SpO2: 98% 99% 96% 96%    Intake/Output Summary (Last 24 hours) at 02/28/12 0851 Last data filed at 02/28/12 0556  Gross per 24 hour  Intake   3995 ml  Output   1290 ml  Net   2705 ml   Filed Weights   02/25/12 2332 02/26/12 2137 02/27/12 2040  Weight: 87.68 kg (193 lb 4.8 oz) 87.8 kg (193 lb 9 oz) 92.08 kg (203 lb)    Exam: General exam: Comfortable. Respiratory system: Clear to auscultation. No increased work of breathing. Cardiovascular system: First and second heart sounds heard, regular rate and rhythm. No JVD, murmurs or pedal edema. Gastrointestinal system: Abdomen is nondistended, soft. Normal bowel sounds heard. Drain right upper quadrant which has minimal amount of blood stained fluid. Mild  tenderness right upper quadrant but without rigidity, guarding or rebound. Central nervous system: Alert and oriented x3. No focal neurological deficits. Extremities:  Symmetric 5 x 5 power.   Data Reviewed: Basic Metabolic Panel:  Lab 02/28/12 7829 02/27/12 5621 02/26/12 0640 02/25/12 0620 02/24/12 0933 02/24/12 0029  NA 138 132* 136 134* -- 134*  K 4.2 3.8 4.6 4.5 -- 5.3*  CL 108 100 104 100 -- 99  CO2 22 22 25 24  -- 27  GLUCOSE 92 227* 103* 119* -- 121*  BUN 22 20 25* 26* -- 23  CREATININE 1.22 1.24 1.48* 1.52* 1.12 --  CALCIUM 8.4 8.3* 8.8 9.3 -- 10.1  MG -- -- -- -- -- --  PHOS -- -- -- -- -- --   Liver Function Tests:  Lab 02/28/12 0700 02/26/12 0640 02/25/12 0620 02/24/12 0507  AST 38* 36 26 27  ALT 23 15 16 18   ALKPHOS 53 49 50 59  BILITOT 0.4 0.4 0.6 0.4  PROT 5.8* 5.9* 6.6 7.0  ALBUMIN 2.8* 2.9* 3.3* 3.8    Lab 02/24/12 0029  LIPASE 34  AMYLASE --   No results found for this basename: AMMONIA:5 in the last 168 hours CBC:  Lab 02/28/12 0700 02/27/12 0658 02/26/12 0640 02/25/12 0620 02/24/12 0933 02/24/12 0029  WBC 7.9 11.5* 16.1* 24.1* 17.9* --  NEUTROABS -- -- 11.9* -- -- 11.2*  HGB 11.2* 10.9* 12.0* 13.1 13.8 --  HCT 32.7* 32.0* 34.5* 38.0* 40.1 --  MCV 96.5 97.3 97.2 97.2 96.6 --  PLT 230 206 195 198 227 --   Cardiac Enzymes:  Lab 02/24/12 2040 02/24/12 1415 02/24/12 0932 02/24/12 0507  CKTOTAL -- -- -- --  CKMB -- -- -- --  CKMBINDEX -- -- -- --  TROPONINI <0.30 <0.30 <0.30 <0.30   BNP (last 3 results) No results found for this basename: PROBNP:3 in the last 8760 hours CBG: No results found for this basename: GLUCAP:5 in the last 168 hours  Recent Results (from the past 240 hour(s))  MRSA PCR SCREENING     Status: Normal   Collection Time   02/26/12 10:34 AM      Component Value Range Status Comment   MRSA by PCR NEGATIVE  NEGATIVE Final      Studies: US Abdomen Complete  02/24/2012  *RADIOLOGY REPORT*  Clinical Data:  Abdominal pain  ABDOMINAL ULTRASOUND COMPLETE  Comparison:  CT earlier today  Findings:  Gallbladder:  Multiple gallstones.  The largest is 2.3 cm. Significant gallbladder wall thickening  measures up to 7 mm.  The gallbladder wall is edematous.  No pericholecystic fluid.  No Murphy's sign.  Sludge is noted.  Common Bile Duct:  Within normal limits in caliber.  Liver: No focal mass lesion identified.  Within normal limits in parenchymal echogenicity.  IVC:  Appears normal.  Pancreas:  Obscured by overlying bowel gas.  Spleen:  Within normal limits in size and echotexture.  Right kidney:  Normal in size.  Cortical thinning is noted.  No mass or hydronephrosis.  Left kidney:  Normal in size.  No hydronephrosis or solid mass.  Abdominal Aorta:  Partially obscured.  Maximal visualized caliber is 2.9 cm.  IMPRESSION: Study is limited as described.  Cholelithiasis.  There is significant gallbladder wall thickening and edema but no sonographic Murphy's sign.  Correlate clinically as for the need for nuclear medicine imaging to evaluate for acute cholecystitis.   Original Report Authenticated By: Donavan Burnet, M.D.    Ct Abdomen Pelvis  W Contrast  02/24/2012  *RADIOLOGY REPORT*  Clinical Data: Left side abdominal pain and chest pain, question diverticulitis, history coronary artery disease post MI, hypertension, GERD  CT ABDOMEN AND PELVIS WITH CONTRAST  Technique:  Multidetector CT imaging of the abdomen and pelvis was performed following the standard protocol during bolus administration of intravenous contrast. Sagittal and coronal MPR images reconstructed from axial data set.  Contrast: OMNIPAQUE IOHEXOL 300 MG/ML  SOLN Dilute oral contrast.  Comparison: None  Findings: Partial atelectasis of the right lower lobe. Herniation of a portion of right middle lobe through an anterior chest wall defect. 2.5 cm diameter gallstone within gallbladder. Gallbladder appears mildly distended with question minimal pericholecystic edema. No biliary dilatation. Tiny cyst upper pole left kidney. Liver, spleen, pancreas, kidneys, and adrenal glands otherwise normal appearance. Incidental note of a circumaortic left  renal vein.  Scattered stool throughout colon with note of uncomplicated diverticula of the descending and sigmoid colon. No definite evidence of acute diverticulitis. Stomach and small bowel loops unremarkable. Normal appendix. Tiny right side bladder diverticulum at/near ureteral orifice. No mass, adenopathy, free fluid or inflammatory process. Scattered degenerative changes of the lumbar spine.  IMPRESSION: Descending and sigmoid colonic diverticulosis without evidence of diverticulitis. Cholelithiasis with question minimal pericholecystic edema; if there is clinical concern for acute cholecystitis, recommend abdominal ultrasound. Herniation of a small portion of the anterior right middle lobe through a chest wall defect at the anterior lower chest. Tiny bladder diverticulum. No other intra abdominal or intrapelvic abnormalities identified.   Original Report Authenticated By: Lollie Marrow, M.D.    Dg Abd Acute W/chest  02/24/2012  *RADIOLOGY REPORT*  Clinical Data: Left upper quadrant pain, chest pain, shortness of breath, history CABG  ACUTE ABDOMEN SERIES (ABDOMEN 2 VIEW & CHEST 1 VIEW)  Comparison: Chest radiograph 10/29/2009, abdominal radiograph 10/17/2008  Findings: Normal heart size post CABG. Atherosclerotic calcification thoracic aorta. Mediastinal contours and pulmonary vascularity normal. Chronic elevation of right diaphragm with blunting of right costophrenic angle. No acute infiltrate, pleural effusion or pneumothorax. Normal bowel gas pattern. No bowel dilatation, bowel wall thickening or free intraperitoneal air. Multilevel endplate spur formation and disc space narrowing lumbar spine. Diffuse osseous demineralization. No acute osseous findings.  IMPRESSION: Chronic right basilar lung changes/scarring. No acute abdominal findings.   Original Report Authenticated By: Lollie Marrow, M.D.     Scheduled Meds:    . aspirin EC  81 mg Oral Daily  . atorvastatin  10 mg Oral q1800  . carvedilol   3.125 mg Oral BID WC  . ciprofloxacin  500 mg Oral BID  . docusate sodium  100 mg Oral BID  . famotidine  20 mg Oral BID  . heparin  5,000 Units Subcutaneous Q8H  . levothyroxine  50 mcg Oral QAC breakfast  . lisinopril  10 mg Oral Daily  . metroNIDAZOLE  500 mg Oral Q8H  . Tamsulosin HCl  0.4 mg Oral QPC supper  . DISCONTD: ciprofloxacin  400 mg Intravenous Q12H  . DISCONTD: lisinopril  10 mg Oral Daily  . DISCONTD: metronidazole  500 mg Intravenous Q8H   Continuous Infusions:    . dextrose 5 % and 0.9% NaCl 100 mL/hr at 02/27/12 0610    Time spent: 30 minutes    [facility]  [facility] Pager [phone]. If 8PM-8AM, please contact night-coverage at [email or web address], password [facility] 02/28/2012, 8:51 AM  LOS: 4 days

## 2012-02-28 NOTE — Progress Notes (Signed)
Looks good and ready to go home. Abd soft and benign. JP thin.  From surgical standpoint ready to go home

## 2012-02-28 NOTE — Progress Notes (Signed)
2 Days Post-Op  Subjective: Sitting up at bedside, no c/o offered, +BM, Flatus No N/V tolerating diet.  Objective: Vital signs in last 24 hours: Temp:  [97.3 F (36.3 C)-97.8 F (36.6 C)] 97.8 F (36.6 C) (09/07 1035) Pulse Rate:  [64-70] 66  (09/07 1035) Resp:  [18-20] 18  (09/07 1035) BP: (119-145)/(67-82) 143/67 mmHg (09/07 1035) SpO2:  [96 %-99 %] 98 % (09/07 1035) Weight:  [203 lb (92.08 kg)] 203 lb (92.08 kg) (09/06 2040) Last BM Date: 02/28/12 (Pt had two bowel movement)  Intake/Output from previous day: 09/06 0701 - 09/07 0700 In: 3995 [P.O.:720; I.V.:2375; IV Piggyback:900] Out: 1290 [Urine:1250; Drains:40] Intake/Output this shift: Total I/O In: 120 [P.O.:120] Out: -   General appearance: alert, cooperative, appears stated age and no distress Chest: CTA bilaterally Cardiac: RRR Abdomen: all surgical incison sites appear well approximated, no drainage or erythema, JP draining SS (40 ml past 24 hrs) VSS,afebrile, labs: WBC wnl Creatine trending down.   Lab Results:   Basename 02/28/12 0700 02/27/12 0658  WBC 7.9 11.5*  HGB 11.2* 10.9*  HCT 32.7* 32.0*  PLT 230 206   BMET  Basename 02/28/12 0700 02/27/12 0658  NA 138 132*  K 4.2 3.8  CL 108 100  CO2 22 22  GLUCOSE 92 227*  BUN 22 20  CREATININE 1.22 1.24  CALCIUM 8.4 8.3*   PT/INR No results found for this basename: LABPROT:2,INR:2 in the last 72 hours ABG No results found for this basename: PHART:2,PCO2:2,PO2:2,HCO3:2 in the last 72 hours  Studies/Results: No results found.  Anti-infectives: Anti-infectives     Start     Dose/Rate Route Frequency Ordered Stop   02/28/12 1000   ciprofloxacin (CIPRO) tablet 500 mg        500 mg Oral 2 times daily 02/28/12 0832     02/28/12 1000   metroNIDAZOLE (FLAGYL) tablet 500 mg        500 mg Oral 3 times per day 02/28/12 0832     02/24/12 1000   metroNIDAZOLE (FLAGYL) IVPB 500 mg  Status:  Discontinued        500 mg 100 mL/hr over 60 Minutes  Intravenous Every 8 hours 02/24/12 0847 02/28/12 0832   02/24/12 0700   ciprofloxacin (CIPRO) IVPB 400 mg  Status:  Discontinued        400 mg 200 mL/hr over 60 Minutes Intravenous Every 12 hours 02/24/12 0647 02/28/12 0832          Assessment/Plan: s/p Procedure(s) (LRB) with comments: LAPAROSCOPIC CHOLECYSTECTOMY WITH INTRAOPERATIVE CHOLANGIOGRAM (N/A)  Patient Active Problem List  Diagnosis  . BASAL CELL CARCINOMA, NOSE  . HYPOTHYROIDISM  . HYPERCHOLESTEROLEMIA  . ANEMIA, VITAMIN B12 DEFICIENCY  . HYPERTENSION  . MYOCARDIAL INFARCTION, INFEROPOSTERIOR WALL, SUBSEQUENT CARE  . CAD, ARTERY BYPASS GRAFT  . CARDIOMYOPATHY, ISCHEMIC  . PVD  . RESPIRATORY FAILURE  . GERD  . BENIGN PROSTATIC HYPERTROPHY, WITH URINARY OBSTRUCTION  . ACTINIC KERATOSIS  . Loc osteoarth NOS-l/leg  . HOARSENESS  . ADVEF, DRUG/MEDICINAL/BIOLOGICAL SUBST NOS  . DEEP VENOUS THROMBOPHLEBITIS, HX OF  . CORONARY ARTERY BYPASS GRAFT, HX OF  . BASAL CELL CARCINOMA, NOSE  . Abdominal pain  . Diverticulitis large intestine  . Leukocytosis  . Renal failure, acute  . Cholecystitis, acute  . Urinary retention  . Anemia, posthemorrhagic, acute   Stable for discharge from surgical standpoint, would recommend leaving JP drain in and on abx for 10 days. until F/U visit our office. In 1 weeks time post  discharge. Management of other medical issues per medicine team: and this is much appreciated.    LOS: 4 days    Felicitas Sine 02/28/2012

## 2012-02-29 DIAGNOSIS — A0472 Enterocolitis due to Clostridium difficile, not specified as recurrent: Secondary | ICD-10-CM

## 2012-02-29 LAB — BASIC METABOLIC PANEL
CO2: 21 mEq/L (ref 19–32)
Calcium: 8.7 mg/dL (ref 8.4–10.5)
Chloride: 107 mEq/L (ref 96–112)
Creatinine, Ser: 1.12 mg/dL (ref 0.50–1.35)
Glucose, Bld: 93 mg/dL (ref 70–99)

## 2012-02-29 MED ORDER — ACETAMINOPHEN 325 MG PO TABS: 650.0000 mg | ORAL_TABLET | Freq: Four times a day (QID) | ORAL | Status: AC | PRN

## 2012-02-29 MED ORDER — METRONIDAZOLE 500 MG PO TABS: 500.0000 mg | ORAL_TABLET | Freq: Three times a day (TID) | ORAL | Status: DC

## 2012-02-29 NOTE — Progress Notes (Signed)
3 Days Post-Op  Subjective: Feels ok no pain tolerating solid diet ahd a few loose stools and c diff pending  Objective: Vital signs in last 24 hours: Temp:  [97.3 F (36.3 C)-97.9 F (36.6 C)] 97.4 F (36.3 C) (09/08 0513) Pulse Rate:  [57-73] 73  (09/08 0513) Resp:  [16-18] 18  (09/08 0513) BP: (129-145)/(64-78) 142/76 mmHg (09/08 0513) SpO2:  [94 %-99 %] 94 % (09/08 0513) Weight:  [204 lb 8 oz (92.761 kg)] 204 lb 8 oz (92.761 kg) (09/07 2042)   Intake/Output from previous day: 09/07 0701 - 09/08 0700 In: 660 [P.O.:660] Out: 470 [Urine:401; Drains:65; Stool:4] Intake/Output this shift:     General appearance: alert and no distress GI: soft, non-tender; bowel sounds normal; no masses,  no organomegaly  Incision: healing well, no significant drainage  Lab Results:   Basename 02/28/12 0700 02/27/12 0658  WBC 7.9 11.5*  HGB 11.2* 10.9*  HCT 32.7* 32.0*  PLT 230 206   BMET  Basename 02/29/12 0537 02/28/12 0700  NA 137 138  K 3.9 4.2  CL 107 108  CO2 21 22  GLUCOSE 93 92  BUN 19 22  CREATININE 1.12 1.22  CALCIUM 8.7 8.4   PT/INR No results found for this basename: LABPROT:2,INR:2 in the last 72 hours ABG No results found for this basename: PHART:2,PCO2:2,PO2:2,HCO3:2 in the last 72 hours  MEDS, Scheduled    . aspirin EC  81 mg Oral Daily  . atorvastatin  10 mg Oral q1800  . carvedilol  3.125 mg Oral BID WC  . ciprofloxacin  500 mg Oral BID  . docusate sodium  100 mg Oral BID  . famotidine  20 mg Oral BID  . heparin  5,000 Units Subcutaneous Q8H  . levothyroxine  50 mcg Oral QAC breakfast  . lisinopril  10 mg Oral Daily  . metroNIDAZOLE  500 mg Oral Q8H  . Tamsulosin HCl  0.4 mg Oral QPC supper  . DISCONTD: ciprofloxacin  400 mg Intravenous Q12H  . DISCONTD: lisinopril  10 mg Oral Daily  . DISCONTD: metronidazole  500 mg Intravenous Q8H    Studies/Results: No results found.  Assessment: s/p Procedure(s): LAPAROSCOPIC CHOLECYSTECTOMY WITH  INTRAOPERATIVE CHOLANGIOGRAM Patient Active Problem List  Diagnosis  . BASAL CELL CARCINOMA, NOSE  . HYPOTHYROIDISM  . HYPERCHOLESTEROLEMIA  . ANEMIA, VITAMIN B12 DEFICIENCY  . HYPERTENSION  . MYOCARDIAL INFARCTION, INFEROPOSTERIOR WALL, SUBSEQUENT CARE  . CAD, ARTERY BYPASS GRAFT  . CARDIOMYOPATHY, ISCHEMIC  . PVD  . RESPIRATORY FAILURE  . GERD  . BENIGN PROSTATIC HYPERTROPHY, WITH URINARY OBSTRUCTION  . ACTINIC KERATOSIS  . Loc osteoarth NOS-l/leg  . HOARSENESS  . ADVEF, DRUG/MEDICINAL/BIOLOGICAL SUBST NOS  . DEEP VENOUS THROMBOPHLEBITIS, HX OF  . CORONARY ARTERY BYPASS GRAFT, HX OF  . BASAL CELL CARCINOMA, NOSE  . Abdominal pain  . Diverticulitis large intestine  . Leukocytosis  . Renal failure, acute  . Urinary retention  . Anemia, posthemorrhagic, acute    Doing well - suspect loose stools are secondary to cholecystectomy but c diff pending  Plan: Discharge When OK with primary care   LOS: 5 days     Currie Paris, MD, [facility], [place] [phone]   02/29/2012 8:13 AM

## 2012-02-29 NOTE — Discharge Summary (Signed)
Physician Discharge Summary  Zachary Decker ZOX:[id number] DOB: February 10, 1933 DOA: 02/24/2012  PCP: Carrie Mew, MD  Admit date: 02/24/2012 Discharge date: 02/29/2012  Recommendations for Outpatient Follow-up:  1. Followup with the surgeons in one week from hospital discharge. 2. Followup with primary medical doctor in one week from hospital discharge.  Discharge Diagnoses:  Active Problems:  HYPOTHYROIDISM  HYPERCHOLESTEROLEMIA  HYPERTENSION  CAD, ARTERY BYPASS GRAFT  GERD  Abdominal pain  Diverticulitis large intestine  Leukocytosis  Renal failure, acute  Urinary retention  Anemia, posthemorrhagic, acute   Discharge Condition: Improved and stable.  Diet recommendation: Heart healthy diet.  Filed Weights   02/26/12 2137 02/27/12 2040 02/28/12 2042  Weight: 87.8 kg (193 lb 9 oz) 92.08 kg (203 lb) 92.761 kg (204 lb 8 oz)    History of present illness:  78 y.o. male with hx of CAD s/p prior MI and CABG, PVD, HTN, Hypercholesterolemia, GERD, presents to the ER with substernal chest burning for a few days. He said he took the Nexium and it was helpful. He denied any SOB, pleuritic pain nor any radiation of his pain. He took some Peptobismol, but it wasn't helpful. He therefore presents to the ER. In the ER, his chest pain resolved completely, but he started complaining of LLQ pain. Evaluation in the ER showed unremarkable EKG, negative troponin, but he has mild leukocytosis. He subsequently had a abd/pelvic CT to exclude diverticulitis. It showed diverticulosis without diverticulitis, and large stone with pericholic edema suggestive of acute cholecystitis. Surgery evaluated and performed laparoscopic cholecystectomy on 9/5-confirming diagnosis of cholecystitis.   Hospital Course:  1. Acute cholecystitis: Status post laparoscopic cholecystectomy on 9/5. Showed necrotic gallbladder. Patient is tolerating diet. Cipro has been discontinued secondary to C. difficile. As discussed with  infectious disease M.D. on call, will treat with oral Flagyl for additional 14 days. Patient will go home on JP drain and followup with the surgeons in one week post discharge.  2. C. difficile colitis: Complete 14 days of Flagyl orally. Patient indicates that his stool this morning is more formed and he is less diarrhea than last night. Discussed with infectious disease M.D. who recommends continuing oral Flagyl. However if his diarrhea were to get worse in the next couple of days, then consider switching to oral vancomycin 125 mg by mouth 4 times a day. Offered patient to observe in hospital an additional 24 hours so we can monitor how his diarrhea is improving. Patient however insists on going home and indicates that he will seek immediate medical attention if his diarrhea gets worse. Same was conveyed and discussed with patient's spouse today.  3. Urinary retention: Likely secondary to pain and pain medications complicating BPH. Had in and out cath x1 two nights ago. Voiding freely since then. 4. Acute blood loss anemia: 150 mL of EBL. Stable.  5. Acute renal failure:? Secondary to dehydration and recent contrast. Resolved.  6. Hypertension: Controlled. Lisinopril had been temporarily held secondary to acute renal failure. Resume lisinopril and carvedilol at discharge. 7. History of CAD, status post CABG 3-1/2 years ago. Denies chest pain. Multiple troponins negative. Stable. 8. GERD: Patient has not been on PPIs but on ranitidine. 9. Diverticulosis:  10. Hypothyroidism: will continue Synthroid.  Procedures:  Laparoscopic cholecystectomy on 9/5.  Consultations:  General surgery.  Discharge Exam:  Complaints: 4 episodes of loose stools last night without abdominal pain. No nausea vomiting. Tolerating meals. One BM today which was more formed and less in amount.   Filed Vitals:  02/28/12 1701 02/28/12 2042 02/29/12 0513 02/29/12 1000  BP: 132/64 133/72 142/76 145/67  Pulse: 57 64 73 74   Temp: 97.9 F (36.6 C) 97.3 F (36.3 C) 97.4 F (36.3 C) 97.8 F (36.6 C)  TempSrc: Oral Oral Oral Oral  Resp: 18 16 18 18   Height:      Weight:  92.761 kg (204 lb 8 oz)    SpO2: 98% 98% 94% 96%    General exam: Comfortable.  Respiratory system: Clear to auscultation. No increased work of breathing.  Cardiovascular system: First and second heart sounds heard, regular rate and rhythm. No JVD, murmurs or pedal edema.  Gastrointestinal system: Abdomen is nondistended, soft. Normal bowel sounds heard. Drain right upper quadrant which has minimal amount of blood stained fluid. Mild tenderness right upper quadrant but without rigidity, guarding or rebound.  Central nervous system: Alert and oriented x3. No focal neurological deficits.  Extremities: Symmetric 5 x 5 power.   Discharge Instructions  Discharge Orders    Future Appointments: Provider: Department: Dept Phone: Center:   03/25/2012 10:00 AM Stacie Glaze, MD [facility] [phone] [facility]     Future Orders Please Complete By Expires   Diet - low sodium heart healthy      Increase activity slowly      Call MD for:      Comments:   Worsening diarrhea.   Call MD for:  temperature >100.4      Call MD for:  persistant nausea and vomiting      Call MD for:  severe uncontrolled pain      Call MD for:  redness, tenderness, or signs of infection (pain, swelling, redness, odor or green/yellow discharge around incision site)      Call MD for:  difficulty breathing, headache or visual disturbances        Medication List  As of 02/29/2012  1:24 PM   TAKE these medications         acetaminophen 325 MG tablet   Commonly known as: TYLENOL   Take 2 tablets (650 mg total) by mouth every 6 (six) hours as needed for pain or fever.      aspirin 81 MG tablet   Take 81 mg by mouth daily.      carvedilol 3.125 MG tablet   Commonly known as: COREG   TAKE 1 TABLET TWICE A DAY WITH FOOD      diazepam 5 MG tablet   Commonly  known as: VALIUM   Take 5 mg by mouth every 8 (eight) hours as needed. For anxiety      Folbee Plus Tabs   TAKE 1 TABLET EVERY DAY      levothyroxine 50 MCG tablet   Commonly known as: SYNTHROID, LEVOTHROID   Take 1 tablet (50 mcg total) by mouth daily.      lisinopril 10 MG tablet   Commonly known as: PRINIVIL,ZESTRIL   Take 1 tablet (10 mg total) by mouth daily.      metroNIDAZOLE 500 MG tablet   Commonly known as: FLAGYL   Take 1 tablet (500 mg total) by mouth every 8 (eight) hours.      ranitidine 300 MG tablet   Commonly known as: ZANTAC   Take 1 tablet (300 mg total) by mouth at bedtime.      rosuvastatin 5 MG tablet   Commonly known as: CRESTOR   Take 5 mg by mouth daily.      Tamsulosin HCl 0.4 MG Caps  Commonly known as: FLOMAX   Take 1 capsule (0.4 mg total) by mouth daily after supper.           Follow-up Information    Follow up with LAYTON, BRIAN DAVID, DO in 1 week. (Call our office as needed or if symptoms worsen)    Contact information:   [address] [place] [address] [phone]       Follow up with Carrie Mew, MD. Schedule an appointment as soon as possible for a visit in 1 week. (or earlier if worsening diarrhea.)    Contact information:   [address] [place] [address] [phone]           The results of significant diagnostics from this hospitalization (including imaging, microbiology, ancillary and laboratory) are listed below for reference.    Significant Diagnostic Studies: Dg Cholangiogram Operative  02/26/2012  *RADIOLOGY REPORT*  Clinical Data:   Laparoscopic cholecystectomy.  INTRAOPERATIVE CHOLANGIOGRAM  Technique:  Cholangiographic images from the C-arm fluoroscopic device were submitted for interpretation post-operatively.  Please see the procedural report for the amount of contrast and the fluoroscopy time utilized.  Comparison:  None.  Findings:  A single image  demonstrates normal opacification of the residual cystic duct.  There are no filling defects in the common hepatic duct.  Common bile duct is clear.  The left hepatic duct does not fill completely.  This may be due to under distention.  IMPRESSION:  1.  Incomplete filling of the left hepatic duct.  This is likely due to under distention.  Obstruction is not excluded. 2.  No evidence of filling defects elsewhere or leak.   Original Report Authenticated By: Jamesetta Orleans. MATTERN, M.D.    Nm Hepatobiliary Liver Func  02/25/2012  *RADIOLOGY REPORT*  Clinical Data: Abdominal pain.  Gallstones.  NUCLEAR MEDICINE HEPATOHBILIARY INCLUDE GB  Radiopharmaceutical:  5 mCi Choletec initially followed by 2 mCi Choletec at 85 minutes.  Comparison: 02/24/2012 CT and ultrasound  Findings: Homogeneous uptake by the liver.  Small bowel visualized by 9 minutes.  Gallbladder not visualized in the first hour.  Second dose of Choletec with nonvisualization of the gallbladder by 2 hours.  It was elected by initial radiologist not to administer Morphine at this point.  The patient returned for 4 and 1/2 hour delayed imaging.  No gallbladder visualized.  IMPRESSION: Gallbladder not visualized.  Findings suggestive of cystic duct obstruction.  Clinical correlation recommended.   Original Report Authenticated By: Fuller Canada, M.D.    US Abdomen Complete  02/24/2012  *RADIOLOGY REPORT*  Clinical Data:  Abdominal pain  ABDOMINAL ULTRASOUND COMPLETE  Comparison:  CT earlier today  Findings:  Gallbladder:  Multiple gallstones.  The largest is 2.3 cm. Significant gallbladder wall thickening measures up to 7 mm.  The gallbladder wall is edematous.  No pericholecystic fluid.  No Murphy's sign.  Sludge is noted.  Common Bile Duct:  Within normal limits in caliber.  Liver: No focal mass lesion identified.  Within normal limits in parenchymal echogenicity.  IVC:  Appears normal.  Pancreas:  Obscured by overlying bowel gas.  Spleen:  Within  normal limits in size and echotexture.  Right kidney:  Normal in size.  Cortical thinning is noted.  No mass or hydronephrosis.  Left kidney:  Normal in size.  No hydronephrosis or solid mass.  Abdominal Aorta:  Partially obscured.  Maximal visualized caliber is 2.9 cm.  IMPRESSION: Study is limited as described.  Cholelithiasis.  There is significant gallbladder wall thickening and edema but no sonographic Murphy's sign.  Correlate clinically as for the need for nuclear medicine imaging to evaluate for acute cholecystitis.   Original Report Authenticated By: Donavan Burnet, M.D.    Ct Abdomen Pelvis W Contrast  02/24/2012  *RADIOLOGY REPORT*  Clinical Data: Left side abdominal pain and chest pain, question diverticulitis, history coronary artery disease post MI, hypertension, GERD  CT ABDOMEN AND PELVIS WITH CONTRAST  Technique:  Multidetector CT imaging of the abdomen and pelvis was performed following the standard protocol during bolus administration of intravenous contrast. Sagittal and coronal MPR images reconstructed from axial data set.  Contrast: OMNIPAQUE IOHEXOL 300 MG/ML  SOLN Dilute oral contrast.  Comparison: None  Findings: Partial atelectasis of the right lower lobe. Herniation of a portion of right middle lobe through an anterior chest wall defect. 2.5 cm diameter gallstone within gallbladder. Gallbladder appears mildly distended with question minimal pericholecystic edema. No biliary dilatation. Tiny cyst upper pole left kidney. Liver, spleen, pancreas, kidneys, and adrenal glands otherwise normal appearance. Incidental note of a circumaortic left renal vein.  Scattered stool throughout colon with note of uncomplicated diverticula of the descending and sigmoid colon. No definite evidence of acute diverticulitis. Stomach and small bowel loops unremarkable. Normal appendix. Tiny right side bladder diverticulum at/near ureteral orifice. No mass, adenopathy, free fluid or inflammatory process.  Scattered degenerative changes of the lumbar spine.  IMPRESSION: Descending and sigmoid colonic diverticulosis without evidence of diverticulitis. Cholelithiasis with question minimal pericholecystic edema; if there is clinical concern for acute cholecystitis, recommend abdominal ultrasound. Herniation of a small portion of the anterior right middle lobe through a chest wall defect at the anterior lower chest. Tiny bladder diverticulum. No other intra abdominal or intrapelvic abnormalities identified.   Original Report Authenticated By: Lollie Marrow, M.D.    Dg Abd Acute W/chest  02/24/2012  *RADIOLOGY REPORT*  Clinical Data: Left upper quadrant pain, chest pain, shortness of breath, history CABG  ACUTE ABDOMEN SERIES (ABDOMEN 2 VIEW & CHEST 1 VIEW)  Comparison: Chest radiograph 10/29/2009, abdominal radiograph 10/17/2008  Findings: Normal heart size post CABG. Atherosclerotic calcification thoracic aorta. Mediastinal contours and pulmonary vascularity normal. Chronic elevation of right diaphragm with blunting of right costophrenic angle. No acute infiltrate, pleural effusion or pneumothorax. Normal bowel gas pattern. No bowel dilatation, bowel wall thickening or free intraperitoneal air. Multilevel endplate spur formation and disc space narrowing lumbar spine. Diffuse osseous demineralization. No acute osseous findings.  IMPRESSION: Chronic right basilar lung changes/scarring. No acute abdominal findings.   Original Report Authenticated By: Lollie Marrow, M.D.     Microbiology: Recent Results (from the past 240 hour(s))  MRSA PCR SCREENING     Status: Normal   Collection Time   02/26/12 10:34 AM      Component Value Range Status Comment   MRSA by PCR NEGATIVE  NEGATIVE Final   CLOSTRIDIUM DIFFICILE BY PCR     Status: Abnormal   Collection Time   02/28/12 11:00 PM      Component Value Range Status Comment   C difficile by pcr POSITIVE (*) NEGATIVE Final      Labs: Basic Metabolic Panel:  Lab  02/29/12 0537 02/28/12 0700 02/27/12 0658 02/26/12 0640 02/25/12 0620  NA 137 138 132* 136 134*  K 3.9 4.2 3.8 4.6 4.5  CL 107 108 100 104 100  CO2 21 22 22 25 24   GLUCOSE 93 92 227* 103* 119*  BUN 19 22  20 25* 26*  CREATININE 1.12 1.22 1.24 1.48* 1.52*  CALCIUM 8.7 8.4 8.3* 8.8 9.3  MG -- -- -- -- --  PHOS -- -- -- -- --   Liver Function Tests:  Lab 02/28/12 0700 02/26/12 0640 02/25/12 0620 02/24/12 0507  AST 38* 36 26 27  ALT 23 15 16 18   ALKPHOS 53 49 50 59  BILITOT 0.4 0.4 0.6 0.4  PROT 5.8* 5.9* 6.6 7.0  ALBUMIN 2.8* 2.9* 3.3* 3.8    Lab 02/24/12 0029  LIPASE 34  AMYLASE --   No results found for this basename: AMMONIA:5 in the last 168 hours CBC:  Lab 02/28/12 0700 02/27/12 0658 02/26/12 0640 02/25/12 0620 02/24/12 0933 02/24/12 0029  WBC 7.9 11.5* 16.1* 24.1* 17.9* --  NEUTROABS -- -- 11.9* -- -- 11.2*  HGB 11.2* 10.9* 12.0* 13.1 13.8 --  HCT 32.7* 32.0* 34.5* 38.0* 40.1 --  MCV 96.5 97.3 97.2 97.2 96.6 --  PLT 230 206 195 198 227 --   Cardiac Enzymes:  Lab 02/24/12 2040 02/24/12 1415 02/24/12 0932 02/24/12 0507  CKTOTAL -- -- -- --  CKMB -- -- -- --  CKMBINDEX -- -- -- --  TROPONINI <0.30 <0.30 <0.30 <0.30   BNP: BNP (last 3 results) No results found for this basename: PROBNP:3 in the last 8760 hours CBG: No results found for this basename: GLUCAP:5 in the last 168 hours  Time coordinating discharge: Less than 30 minutes  Signed:  Kaila Devries  [facility] 02/29/2012, 1:24 PM

## 2012-02-29 NOTE — Progress Notes (Signed)
Pt received D/C instructions, daughter at the bedside, pt verbalizes understanding of instructions and the signs/symptoms to follow up with MD, IV and telemetry D/C'd, pt to be transported to short stay entrance via wheelchair

## 2012-02-29 NOTE — Progress Notes (Signed)
Noted that C Diff is +. I believe we can stop antibiotic for cholecystitis, and this may help with his c. diff

## 2012-02-29 NOTE — Progress Notes (Signed)
Received call from Pearlie Oyster @ 1015am from lab with critical value, pt C-Diff specimen was positive, notified MD of results, pt already on contact isolation,  will continue to monitor

## 2012-03-01 ENCOUNTER — Telehealth (INDEPENDENT_AMBULATORY_CARE_PROVIDER_SITE_OTHER): Payer: Self-pay

## 2012-03-01 ENCOUNTER — Telehealth: Payer: Self-pay | Admitting: Internal Medicine

## 2012-03-01 NOTE — Telephone Encounter (Signed)
Patient was discharged last week and was told to make appt with [facility] for end of this week. Layton not in office. Can this be a nurse only appt or see another physician for drain pull?

## 2012-03-01 NOTE — Telephone Encounter (Signed)
Please call pt- I have put him in at 10:30 on 9-13-please let him know

## 2012-03-01 NOTE — Telephone Encounter (Signed)
Pt aware of appt.

## 2012-03-01 NOTE — Telephone Encounter (Signed)
Pt needs a Hospital follow up this week. Pt was recently discharged from the hospital he has surgery on his gallbladder. Pt requesting to see only Dr. Lovell Sheehan. Please advise

## 2012-03-02 ENCOUNTER — Telehealth: Payer: Self-pay | Admitting: Internal Medicine

## 2012-03-02 NOTE — Telephone Encounter (Signed)
Caller: Rachel/Spouse; Patient Name: Zachary Decker; PCP: Darryll Capers (Adults only); Best Callback Phone Number: [phone]; Reason for call:  Post op drain s/p gallbladder removal 02/26/12.  Has follow up with Dr. Lovell Sheehan 03/05/12.  Wife calling about foot swelling bilaterally.  Voiding qs.  Has been off lasix for almost a year.  States did contact surgeon's office regarding this, but states they told her to call Dr.  Lovell Sheehan' office if problems.  Onset 02/29/12 but worse over past 24 hours.  No new cough or shortness of breath.  Has been up ambulating during the day.  Per atraumatic edema protocol, emergent symptoms denied; advised appointment within 24 hours.  Appointment scheduled 03/03/12 0945 with Ms. Orvan Falconer.

## 2012-03-02 NOTE — Telephone Encounter (Signed)
Nurse visit scheduled for 03/04/12 & post op appointment scheduled for 03/11/12 w/Dr. Biagio Quint

## 2012-03-03 ENCOUNTER — Ambulatory Visit (INDEPENDENT_AMBULATORY_CARE_PROVIDER_SITE_OTHER): Payer: Medicare Other | Admitting: Family

## 2012-03-03 ENCOUNTER — Encounter: Payer: Self-pay | Admitting: Family

## 2012-03-03 VITALS — BP 140/82 | HR 74 | Temp 98.4°F | Wt 200.0 lb

## 2012-03-03 DIAGNOSIS — I10 Essential (primary) hypertension: Secondary | ICD-10-CM | POA: Diagnosis not present

## 2012-03-03 DIAGNOSIS — Z9049 Acquired absence of other specified parts of digestive tract: Secondary | ICD-10-CM

## 2012-03-03 DIAGNOSIS — R609 Edema, unspecified: Secondary | ICD-10-CM

## 2012-03-03 DIAGNOSIS — Z9089 Acquired absence of other organs: Secondary | ICD-10-CM

## 2012-03-03 MED ORDER — FUROSEMIDE 20 MG PO TABS: 20.0000 mg | ORAL_TABLET | Freq: Every day | ORAL | Status: DC

## 2012-03-03 NOTE — Progress Notes (Signed)
Subjective:    Patient ID: Zachary Decker, male    DOB: 18-Jun-1933, 78 y.o.   MRN: [id number]  HPI 78 year old white male, nonsmoker, patient of Dr. Lovell Sheehan is in with complaints of peripheral edema. He is status post cholecystectomy on 02/26/2012. Patient presented to the emergency department with chest pain and thought it was a heart attack. He received a lot of fluid. His edema today he has improved from yesterday, but continues to have swelling in his ankles and feet. He has a history of cardiovascular disease and renal insufficiency. Last labs showed sufficient renal perfusion.   Review of Systems  Constitutional: Negative.   Respiratory: Negative.   Cardiovascular: Positive for leg swelling. Negative for chest pain and palpitations.  Gastrointestinal: Negative.   Genitourinary: Negative.   Musculoskeletal: Negative.   Skin: Negative.   Neurological: Negative.   Hematological: Negative.   Psychiatric/Behavioral: Negative.    Past Medical History  Diagnosis Date  . CAD (coronary artery disease)   . Acute myocardial infarction of inferoposterior wall, subsequent episode of care   . Hypertension   . Hypercholesterolemia   . PVD (peripheral vascular disease)   . Personal history of thrombophlebitis   . Encounter for long-term (current) use of other medications   . Respiratory failure   . GERD (gastroesophageal reflux disease)   . Hypertrophy of prostate with urinary obstruction and other lower urinary tract symptoms (LUTS)   . Unspecified adverse effect of unspecified drug, medicinal and biological substance   . Unspecified hypothyroidism   . Family history of malignant neoplasm of gastrointestinal tract     History   Social History  . Marital Status: Married    Spouse Name: N/A    Number of Children: N/A  . Years of Education: N/A   Occupational History  . Not on file.   Social History Main Topics  . Smoking status: Never Smoker   . Smokeless tobacco: Not on file   . Alcohol Use: No  . Drug Use: No  . Sexually Active: Not on file   Other Topics Concern  . Not on file   Social History Narrative  . No narrative on file    Past Surgical History  Procedure Date  . Tonsillectomy   . Knee surgery   . Coronary artery bypass graft     x 5 on August 29, 2008  . Tachecostomy placement   . Cholecystectomy 02/26/2012    Procedure: LAPAROSCOPIC CHOLECYSTECTOMY WITH INTRAOPERATIVE CHOLANGIOGRAM;  Surgeon: Lodema Pilot, DO;  Location: [facility] OR;  Service: General;  Laterality: N/A;    Family History  Problem Relation Age of Onset  . Coronary artery disease Other     positive  . Heart disease Mother   . Tuberculosis Father     Allergies  Allergen Reactions  . Losartan Potassium   . Naproxen   . Penicillins     Current Outpatient Prescriptions on File Prior to Visit  Medication Sig Dispense Refill  . acetaminophen (TYLENOL) 325 MG tablet Take 2 tablets (650 mg total) by mouth every 6 (six) hours as needed for pain or fever.      Marland Kitchen aspirin 81 MG tablet Take 81 mg by mouth daily.        . B Complex-C-Folic Acid (FOLBEE PLUS) TABS TAKE 1 TABLET EVERY DAY  30 tablet  6  . carvedilol (COREG) 3.125 MG tablet TAKE 1 TABLET TWICE A DAY WITH FOOD  180 tablet  3  . diazepam (VALIUM) 5 MG tablet  Take 5 mg by mouth every 8 (eight) hours as needed. For anxiety      . levothyroxine (SYNTHROID, LEVOTHROID) 50 MCG tablet Take 1 tablet (50 mcg total) by mouth daily.  90 tablet  3  . lisinopril (PRINIVIL,ZESTRIL) 10 MG tablet Take 1 tablet (10 mg total) by mouth daily.  90 tablet  3  . metroNIDAZOLE (FLAGYL) 500 MG tablet Take 1 tablet (500 mg total) by mouth every 8 (eight) hours.  40 tablet  0  . ranitidine (ZANTAC) 300 MG tablet Take 1 tablet (300 mg total) by mouth at bedtime.  90 tablet  3  . rosuvastatin (CRESTOR) 5 MG tablet Take 5 mg by mouth daily.        . Tamsulosin HCl (FLOMAX) 0.4 MG CAPS Take 1 capsule (0.4 mg total) by mouth daily after supper.  90  capsule  3  . furosemide (LASIX) 20 MG tablet Take 1 tablet (20 mg total) by mouth daily.  5 tablet  0    BP 140/82  Pulse 74  Temp 98.4 F (36.9 C) (Oral)  Wt 200 lb (90.719 kg)  SpO2 98%chart    Objective:   Physical Exam  Constitutional: He is oriented to person, place, and time. He appears well-developed and well-nourished.  Neck: Normal range of motion. Neck supple.  Cardiovascular: Normal rate, regular rhythm and normal heart sounds.   Pulmonary/Chest: Effort normal and breath sounds normal.  Abdominal: Soft. Bowel sounds are normal.  Musculoskeletal: Normal range of motion. He exhibits edema.       2+ pitting edema noted bilaterally.   Neurological: He is alert and oriented to person, place, and time.  Skin: Skin is warm and dry.  Psychiatric: He has a normal mood and affect.          Assessment & Plan:   Assessment: Peripheral Edema, S/P Cholecystectomy  Plan: Edema is most likely a result of volume overload. Because he has a history of renal insufficiency, we will prescribe Lasix 20 mg x5 days only. He will follow with Dr. Lovell Sheehan for recheck as scheduled. Patient will call the office if symptoms worsen or persist. Elevate lower extremities. Recheck a schedule, when necessary. Low sodium diet. Patient does not want to obtain labs today.

## 2012-03-03 NOTE — Patient Instructions (Signed)
Peripheral Edema You have swelling in your legs (peripheral edema). This swelling is due to excess accumulation of salt and water in your body. Edema may be a sign of heart, kidney or liver disease, or a side effect of a medication. It may also be due to problems in the leg veins. Elevating your legs and using special support stockings may be very helpful, if the cause of the swelling is due to poor venous circulation. Avoid long periods of standing, whatever the cause. Treatment of edema depends on identifying the cause. Chips, pretzels, pickles and other salty foods should be avoided. Restricting salt in your diet is almost always needed. Water pills (diuretics) are often used to remove the excess salt and water from your body via urine. These medicines prevent the kidney from reabsorbing sodium. This increases urine flow. Diuretic treatment may also result in lowering of potassium levels in your body. Potassium supplements may be needed if you have to use diuretics daily. Daily weights can help you keep track of your progress in clearing your edema. You should call your caregiver for follow up care as recommended. SEEK IMMEDIATE MEDICAL CARE IF:   You have increased swelling, pain, redness, or heat in your legs.   You develop shortness of breath, especially when lying down.   You develop chest or abdominal pain, weakness, or fainting.   You have a fever.  Document Released: 07/17/2004 Document Revised: 05/29/2011 Document Reviewed: 06/27/2009 [employer] Patient Information 2012 [employer]. 

## 2012-03-04 ENCOUNTER — Encounter (INDEPENDENT_AMBULATORY_CARE_PROVIDER_SITE_OTHER): Payer: Medicare Other

## 2012-03-05 ENCOUNTER — Encounter: Payer: Self-pay | Admitting: Internal Medicine

## 2012-03-05 ENCOUNTER — Ambulatory Visit (INDEPENDENT_AMBULATORY_CARE_PROVIDER_SITE_OTHER): Payer: Medicare Other | Admitting: Internal Medicine

## 2012-03-05 VITALS — BP 136/80 | HR 64 | Temp 98.2°F | Resp 16 | Ht 74.5 in | Wt 192.0 lb

## 2012-03-05 DIAGNOSIS — A0472 Enterocolitis due to Clostridium difficile, not specified as recurrent: Secondary | ICD-10-CM | POA: Diagnosis not present

## 2012-03-05 DIAGNOSIS — R197 Diarrhea, unspecified: Secondary | ICD-10-CM | POA: Diagnosis not present

## 2012-03-05 MED ORDER — CHOLESTYRAMINE LIGHT 4 G PO PACK: 4.0000 g | PACK | Freq: Two times a day (BID) | ORAL | Status: DC

## 2012-03-05 NOTE — Patient Instructions (Signed)
Mix the cholestyramine in water and drink a packet twice a day if your stools get too firm he can back off to once a day if the diarrhea persists she can increase to 3 times a day

## 2012-03-11 ENCOUNTER — Ambulatory Visit (INDEPENDENT_AMBULATORY_CARE_PROVIDER_SITE_OTHER): Payer: Medicare Other | Admitting: General Surgery

## 2012-03-11 ENCOUNTER — Encounter (INDEPENDENT_AMBULATORY_CARE_PROVIDER_SITE_OTHER): Payer: Self-pay | Admitting: General Surgery

## 2012-03-11 VITALS — BP 142/76 | HR 76 | Temp 97.4°F | Resp 20 | Ht 74.0 in | Wt 182.4 lb

## 2012-03-11 DIAGNOSIS — Z5189 Encounter for other specified aftercare: Secondary | ICD-10-CM

## 2012-03-11 DIAGNOSIS — Z4889 Encounter for other specified surgical aftercare: Secondary | ICD-10-CM

## 2012-03-11 NOTE — Progress Notes (Signed)
Subjective:     Patient ID: Zachary Decker, male   DOB: 21-Mar-1933, 78 y.o.   MRN: [id number]  HPI This patient follows up 2 weeks status post metastatic cholecystectomy for acute cholecystitis. His pathology was benign. He is also currently on Flagyl being treated for C. Difficile colitis. He has some diarrhea which he says is improving and could be related to the gallbladder or to the C. Difficile colitis. Otherwise he has no discomfort. His only other complaint is lack of appetite. He is asking when he can return to his normal activities such as working out and playing golf  Review of Systems     Objective:   Physical Exam His abdomen is soft and nontender on exam his incisions are healing well without sign of infection.    Assessment:     Status post laparoscopic cholecystectomy-doing well He is doing very well from this procedure. I think that his complaints of poor appetite and diarrhea can be expected for this stage in his recovery. Especially given his current diagnosis of C. Difficile colitis. Recommended that he continue his antibiotics as prescribed by his physician and follow up with his primary care physician as directed. He can gradually increase his activity as tolerated and followup with me when necessary basis.    Plan:     Increase activity as tolerated and followup when necessary

## 2012-03-15 NOTE — Progress Notes (Signed)
Subjective:    Patient ID: Zachary Decker, male    DOB: 08/10/1932, 78 y.o.   MRN: [id number]  HPI  Patient presents for post hospital followup of recent cholecystectomy.  We reviewed the patient's hospital course he initially presented with what was thought to possibly be chest pain radiated to his right shoulder and was initially evaluated for cardiovascular disease. Subsequent evaluation revealed cholecystitis and he underwent a cholecystectomy.  He has done well since cholecystectomy with postcholecystectomy diarrhea being his only symptomology. This is in light of a history of intermittent diarrhea.  He was discharged on Flagyl for potential C. difficile.    Review of Systems  Constitutional: Negative for fever and fatigue.  HENT: Negative for hearing loss, congestion, neck pain and postnasal drip.   Eyes: Negative for discharge, redness and visual disturbance.  Respiratory: Negative for cough, shortness of breath and wheezing.   Cardiovascular: Negative for leg swelling.  Gastrointestinal: Negative for abdominal pain, constipation and abdominal distention.  Genitourinary: Negative for urgency and frequency.  Musculoskeletal: Negative for joint swelling and arthralgias.  Skin: Negative for color change and rash.  Neurological: Negative for weakness and light-headedness.  Hematological: Negative for adenopathy.  Psychiatric/Behavioral: Negative for behavioral problems.   Past Medical History  Diagnosis Date  . CAD (coronary artery disease)   . Acute myocardial infarction of inferoposterior wall, subsequent episode of care   . Hypertension   . Hypercholesterolemia   . PVD (peripheral vascular disease)   . Personal history of thrombophlebitis   . Encounter for long-term (current) use of other medications   . Respiratory failure   . GERD (gastroesophageal reflux disease)   . Hypertrophy of prostate with urinary obstruction and other lower urinary tract symptoms (LUTS)   .  Unspecified adverse effect of unspecified drug, medicinal and biological substance   . Unspecified hypothyroidism   . Family history of malignant neoplasm of gastrointestinal tract     History   Social History  . Marital Status: Married    Spouse Name: N/A    Number of Children: N/A  . Years of Education: N/A   Occupational History  . Not on file.   Social History Main Topics  . Smoking status: Never Smoker   . Smokeless tobacco: Not on file  . Alcohol Use: No  . Drug Use: No  . Sexually Active: Not on file   Other Topics Concern  . Not on file   Social History Narrative  . No narrative on file    Past Surgical History  Procedure Date  . Tonsillectomy   . Knee surgery   . Coronary artery bypass graft     x 5 on August 29, 2008  . Tachecostomy placement   . Cholecystectomy 02/26/2012    Procedure: LAPAROSCOPIC CHOLECYSTECTOMY WITH INTRAOPERATIVE CHOLANGIOGRAM;  Surgeon: Lodema Pilot, DO;  Location: [facility] OR;  Service: General;  Laterality: N/A;    Family History  Problem Relation Age of Onset  . Coronary artery disease Other     positive  . Heart disease Mother   . Tuberculosis Father     Allergies  Allergen Reactions  . Losartan Potassium   . Naproxen   . Penicillins     Current Outpatient Prescriptions on File Prior to Visit  Medication Sig Dispense Refill  . acetaminophen (TYLENOL) 325 MG tablet Take 2 tablets (650 mg total) by mouth every 6 (six) hours as needed for pain or fever.      Marland Kitchen aspirin 81 MG tablet Take  81 mg by mouth daily.        . B Complex-C-Folic Acid (FOLBEE PLUS) TABS TAKE 1 TABLET EVERY DAY  30 tablet  6  . carvedilol (COREG) 3.125 MG tablet TAKE 1 TABLET TWICE A DAY WITH FOOD  180 tablet  3  . diazepam (VALIUM) 5 MG tablet Take 5 mg by mouth every 8 (eight) hours as needed. For anxiety      . furosemide (LASIX) 20 MG tablet Take 1 tablet (20 mg total) by mouth daily.  5 tablet  0  . levothyroxine (SYNTHROID, LEVOTHROID) 50 MCG tablet Take  1 tablet (50 mcg total) by mouth daily.  90 tablet  3  . lisinopril (PRINIVIL,ZESTRIL) 10 MG tablet Take 1 tablet (10 mg total) by mouth daily.  90 tablet  3  . ranitidine (ZANTAC) 300 MG tablet Take 1 tablet (300 mg total) by mouth at bedtime.  90 tablet  3  . rosuvastatin (CRESTOR) 5 MG tablet Take 5 mg by mouth daily.        . Tamsulosin HCl (FLOMAX) 0.4 MG CAPS Take 1 capsule (0.4 mg total) by mouth daily after supper.  90 capsule  3  . cholestyramine (QUESTRAN) 4 G packet Take 1 packet by mouth 3 (three) times daily with meals.      . cholestyramine light (PREVALITE) 4 G packet Take 1 packet (4 g total) by mouth 2 (two) times daily.  60 packet  3    BP 136/80  Pulse 64  Temp 98.2 F (36.8 C)  Resp 16  Ht 6' 2.5" (1.892 m)  Wt 192 lb (87.091 kg)  BMI 24.32 kg/m2       Objective:   Physical Exam  Nursing note and vitals reviewed. Constitutional: He appears well-developed and well-nourished.  HENT:  Head: Normocephalic and atraumatic.  Eyes: Conjunctivae normal are normal. Pupils are equal, round, and reactive to light.  Neck: Normal range of motion. Neck supple.  Cardiovascular: Normal rate and regular rhythm.   Pulmonary/Chest: Effort normal and breath sounds normal.  Abdominal: Soft. Bowel sounds are normal. He exhibits no distension. There is no tenderness. There is no rebound.          Assessment & Plan:  Patient has postcholecystectomy diarrheal dumping syndrome.  This is complicated by potential C. difficile colitis.  We have recommended that he continue the Flagyl for the entire course and we have suggested the addition of Questran 1 scoop by mouth twice a day titrated to 3 times a day as necessary to control diarrhea this should affect both the C. difficile issue the post cholecystectomy dumping syndrome and also have additional beneficial effects with his lipid profile.  We discussed the use of the medication in detail and counseled the patient about what to  look for its potential side effects.  I have spent more than 30 minutes examining this patient face-to-face of which over half was spent in counseling

## 2012-03-25 ENCOUNTER — Ambulatory Visit (INDEPENDENT_AMBULATORY_CARE_PROVIDER_SITE_OTHER): Payer: Medicare Other | Admitting: Internal Medicine

## 2012-03-25 ENCOUNTER — Encounter: Payer: Self-pay | Admitting: Internal Medicine

## 2012-03-25 VITALS — BP 130/84 | HR 72 | Temp 98.2°F | Resp 16 | Ht 74.0 in | Wt 184.0 lb

## 2012-03-25 DIAGNOSIS — T887XXA Unspecified adverse effect of drug or medicament, initial encounter: Secondary | ICD-10-CM | POA: Diagnosis not present

## 2012-03-25 DIAGNOSIS — A0472 Enterocolitis due to Clostridium difficile, not specified as recurrent: Secondary | ICD-10-CM | POA: Diagnosis not present

## 2012-03-25 DIAGNOSIS — Z23 Encounter for immunization: Secondary | ICD-10-CM

## 2012-03-25 DIAGNOSIS — E785 Hyperlipidemia, unspecified: Secondary | ICD-10-CM

## 2012-03-25 LAB — HEPATIC FUNCTION PANEL
Albumin: 3.9 g/dL (ref 3.5–5.2)
Bilirubin, Direct: 0.1 mg/dL (ref 0.0–0.3)
Total Protein: 6.9 g/dL (ref 6.0–8.3)

## 2012-03-25 LAB — CBC WITH DIFFERENTIAL/PLATELET
Basophils Relative: 0.7 % (ref 0.0–3.0)
Eosinophils Absolute: 0.3 10*3/uL (ref 0.0–0.7)
HCT: 41.8 % (ref 39.0–52.0)
Lymphs Abs: 2.1 10*3/uL (ref 0.7–4.0)
MCHC: 32.8 g/dL (ref 30.0–36.0)
MCV: 101.8 fl — ABNORMAL HIGH (ref 78.0–100.0)
Monocytes Absolute: 0.8 10*3/uL (ref 0.1–1.0)
Neutro Abs: 3.7 10*3/uL (ref 1.4–7.7)
Neutrophils Relative %: 53.7 % (ref 43.0–77.0)
RBC: 4.1 Mil/uL — ABNORMAL LOW (ref 4.22–5.81)

## 2012-03-25 LAB — LIPID PANEL
Cholesterol: 120 mg/dL (ref 0–200)
HDL: 41.3 mg/dL (ref >39.00)
Triglycerides: 83 mg/dL (ref 0.0–149.0)

## 2012-03-25 NOTE — Patient Instructions (Signed)
The patient is instructed to continue all medications as prescribed. Schedule followup with check out clerk upon leaving the clinic  

## 2012-03-25 NOTE — Progress Notes (Signed)
Subjective:    Patient ID: Zachary Decker, male    DOB: March 21, 1933, 78 y.o.   MRN: [id number]  HPI  Patient presents for followup of C. difficile diarrhea and of attempt to control diarrhea with Questran . The diarrhea has been stable with borderline constipation Has been off the questran and antibiotics.... Resolution of c- dif colitis possible.  Review of Systems  Constitutional: Positive for fatigue. Negative for fever.  HENT: Negative for hearing loss, congestion, neck pain and postnasal drip.   Eyes: Negative for discharge, redness and visual disturbance.  Respiratory: Negative for cough, shortness of breath and wheezing.   Cardiovascular: Negative for leg swelling.  Gastrointestinal: Positive for constipation. Negative for nausea, abdominal pain, diarrhea and abdominal distention.  Genitourinary: Negative for urgency and frequency.  Musculoskeletal: Negative for joint swelling and arthralgias.  Skin: Negative for color change and rash.  Neurological: Positive for weakness. Negative for light-headedness.  Hematological: Negative for adenopathy.  Psychiatric/Behavioral: Negative for behavioral problems.   Past Medical History  Diagnosis Date  . CAD (coronary artery disease)   . Acute myocardial infarction of inferoposterior wall, subsequent episode of care   . Hypertension   . Hypercholesterolemia   . PVD (peripheral vascular disease)   . Personal history of thrombophlebitis   . Encounter for long-term (current) use of other medications   . Respiratory failure   . GERD (gastroesophageal reflux disease)   . Hypertrophy of prostate with urinary obstruction and other lower urinary tract symptoms (LUTS)   . Unspecified adverse effect of unspecified drug, medicinal and biological substance   . Unspecified hypothyroidism   . Family history of malignant neoplasm of gastrointestinal tract     History   Social History  . Marital Status: Married    Spouse Name: N/A    Number  of Children: N/A  . Years of Education: N/A   Occupational History  . Not on file.   Social History Main Topics  . Smoking status: Never Smoker   . Smokeless tobacco: Not on file  . Alcohol Use: No  . Drug Use: No  . Sexually Active: Not on file   Other Topics Concern  . Not on file   Social History Narrative  . No narrative on file    Past Surgical History  Procedure Date  . Tonsillectomy   . Knee surgery   . Coronary artery bypass graft     x 5 on August 29, 2008  . Tachecostomy placement   . Cholecystectomy 02/26/2012    Procedure: LAPAROSCOPIC CHOLECYSTECTOMY WITH INTRAOPERATIVE CHOLANGIOGRAM;  Surgeon: Lodema Pilot, DO;  Location: [facility] OR;  Service: General;  Laterality: N/A;    Family History  Problem Relation Age of Onset  . Coronary artery disease Other     positive  . Heart disease Mother   . Tuberculosis Father     Allergies  Allergen Reactions  . Losartan Potassium   . Naproxen   . Penicillins     Current Outpatient Prescriptions on File Prior to Visit  Medication Sig Dispense Refill  . acetaminophen (TYLENOL) 325 MG tablet Take 2 tablets (650 mg total) by mouth every 6 (six) hours as needed for pain or fever.      Marland Kitchen aspirin 81 MG tablet Take 81 mg by mouth daily.        . B Complex-C-Folic Acid (FOLBEE PLUS) TABS TAKE 1 TABLET EVERY DAY  30 tablet  6  . carvedilol (COREG) 3.125 MG tablet TAKE 1 TABLET TWICE A  DAY WITH FOOD  180 tablet  3  . cholestyramine (QUESTRAN) 4 G packet Take 1 packet by mouth 3 (three) times daily with meals.      . cholestyramine light (PREVALITE) 4 G packet Take 1 packet (4 g total) by mouth 2 (two) times daily.  60 packet  3  . diazepam (VALIUM) 5 MG tablet Take 5 mg by mouth every 8 (eight) hours as needed. For anxiety      . levothyroxine (SYNTHROID, LEVOTHROID) 50 MCG tablet Take 1 tablet (50 mcg total) by mouth daily.  90 tablet  3  . lisinopril (PRINIVIL,ZESTRIL) 10 MG tablet Take 1 tablet (10 mg total) by mouth daily.  90  tablet  3  . ranitidine (ZANTAC) 300 MG tablet Take 1 tablet (300 mg total) by mouth at bedtime.  90 tablet  3  . rosuvastatin (CRESTOR) 5 MG tablet Take 5 mg by mouth daily.        . Tamsulosin HCl (FLOMAX) 0.4 MG CAPS Take 1 capsule (0.4 mg total) by mouth daily after supper.  90 capsule  3    BP 130/84  Pulse 72  Temp 98.2 F (36.8 C)  Resp 16  Ht 6\' 2"  (1.88 m)  Wt 184 lb (83.462 kg)  BMI 23.62 kg/m2       Objective:   Physical Exam  Vitals reviewed. Constitutional: He is oriented to person, place, and time. He appears well-developed and well-nourished.  HENT:  Head: Atraumatic.  Eyes: Conjunctivae normal and EOM are normal.  Cardiovascular:       Irregular rythmn   Pulmonary/Chest: Effort normal and breath sounds normal.  Abdominal: He exhibits no distension. There is tenderness.  Neurological: He is alert and oriented to person, place, and time.  Psychiatric: He has a normal mood and affect. His behavior is normal.          Assessment & Plan:  3 resolve C. difficile colitis with treatment with antibiotic and with the use of cholestyramine.  Now his bowels are back to normal and burning on constipation.  He's had take occasional stool softener blood pressure stable reflux is improved and the use of an acids is infrequent.  Today we will monitor his cholesterol and a liver and a CBC with differential to make sure infection has resolved

## 2012-05-28 ENCOUNTER — Other Ambulatory Visit: Payer: Self-pay | Admitting: Internal Medicine

## 2012-06-25 ENCOUNTER — Ambulatory Visit (INDEPENDENT_AMBULATORY_CARE_PROVIDER_SITE_OTHER): Payer: Medicare Other | Admitting: Internal Medicine

## 2012-06-25 ENCOUNTER — Encounter: Payer: Self-pay | Admitting: Internal Medicine

## 2012-06-25 VITALS — BP 140/82 | HR 68 | Temp 98.1°F | Resp 16 | Ht 74.0 in | Wt 194.0 lb

## 2012-06-25 DIAGNOSIS — T887XXA Unspecified adverse effect of drug or medicament, initial encounter: Secondary | ICD-10-CM | POA: Diagnosis not present

## 2012-06-25 DIAGNOSIS — I10 Essential (primary) hypertension: Secondary | ICD-10-CM

## 2012-06-25 DIAGNOSIS — R300 Dysuria: Secondary | ICD-10-CM | POA: Diagnosis not present

## 2012-06-25 DIAGNOSIS — R498 Other voice and resonance disorders: Secondary | ICD-10-CM | POA: Diagnosis not present

## 2012-06-25 DIAGNOSIS — N401 Enlarged prostate with lower urinary tract symptoms: Secondary | ICD-10-CM

## 2012-06-25 DIAGNOSIS — N138 Other obstructive and reflux uropathy: Secondary | ICD-10-CM

## 2012-06-25 DIAGNOSIS — E039 Hypothyroidism, unspecified: Secondary | ICD-10-CM | POA: Diagnosis not present

## 2012-06-25 LAB — CBC WITH DIFFERENTIAL/PLATELET
Basophils Absolute: 0.1 10*3/uL (ref 0.0–0.1)
Eosinophils Relative: 6.6 % — ABNORMAL HIGH (ref 0.0–5.0)
HCT: 40.6 % (ref 39.0–52.0)
Hemoglobin: 13.7 g/dL (ref 13.0–17.0)
Lymphs Abs: 2.2 10*3/uL (ref 0.7–4.0)
MCV: 98.5 fl (ref 78.0–100.0)
Monocytes Absolute: 0.9 10*3/uL (ref 0.1–1.0)
Neutro Abs: 4.1 10*3/uL (ref 1.4–7.7)
Platelets: 209 10*3/uL (ref 150.0–400.0)
RDW: 12.2 % (ref 11.5–14.6)

## 2012-06-25 LAB — BASIC METABOLIC PANEL
BUN: 23 mg/dL (ref 6–23)
CO2: 29 mEq/L (ref 19–32)
Chloride: 105 mEq/L (ref 96–112)
Glucose, Bld: 92 mg/dL (ref 70–99)
Potassium: 4.5 mEq/L (ref 3.5–5.1)
Sodium: 139 mEq/L (ref 135–145)

## 2012-06-25 LAB — T4, FREE: Free T4: 0.92 ng/dL (ref 0.60–1.60)

## 2012-06-25 NOTE — Patient Instructions (Addendum)
The patient is instructed to continue all medications as prescribed. Schedule followup with check out clerk upon leaving the clinic  

## 2012-06-25 NOTE — Progress Notes (Signed)
Subjective:    Patient ID: Zachary Decker, male    DOB: 08-14-1932, 78 y.o.   MRN: [id number]  HPI  Patient presents for followup of hypertension hypothyroidism hyperlipidemia and a history of reflux.  Today we will monitor his thyroid and lipid panel.  His blood pressure stable his current medications.  The patient has had mild to moderate urinary tract symptoms of frequency and dysuria of which he took cranberry juice and the symptoms have largely resolved  Review of Systems  Constitutional: Negative for fever and fatigue.  HENT: Negative for hearing loss, congestion, neck pain and postnasal drip.   Eyes: Negative for discharge, redness and visual disturbance.  Respiratory: Negative for cough, shortness of breath and wheezing.   Cardiovascular: Negative for leg swelling.  Gastrointestinal: Negative for abdominal pain, constipation and abdominal distention.  Genitourinary: Negative for urgency and frequency.  Musculoskeletal: Negative for joint swelling and arthralgias.  Skin: Negative for color change and rash.  Neurological: Negative for weakness and light-headedness.  Hematological: Negative for adenopathy.  Psychiatric/Behavioral: Negative for behavioral problems.   Past Medical History  Diagnosis Date  . CAD (coronary artery disease)   . Acute myocardial infarction of inferoposterior wall, subsequent episode of care   . Hypertension   . Hypercholesterolemia   . PVD (peripheral vascular disease)   . Personal history of thrombophlebitis   . Encounter for long-term (current) use of other medications   . Respiratory failure   . GERD (gastroesophageal reflux disease)   . Hypertrophy of prostate with urinary obstruction and other lower urinary tract symptoms (LUTS)   . Unspecified adverse effect of unspecified drug, medicinal and biological substance   . Unspecified hypothyroidism   . Family history of malignant neoplasm of gastrointestinal tract     History   Social  History  . Marital Status: Married    Spouse Name: N/A    Number of Children: N/A  . Years of Education: N/A   Occupational History  . Not on file.   Social History Main Topics  . Smoking status: Never Smoker   . Smokeless tobacco: Not on file  . Alcohol Use: No  . Drug Use: No  . Sexually Active: Not on file   Other Topics Concern  . Not on file   Social History Narrative  . No narrative on file    Past Surgical History  Procedure Date  . Tonsillectomy   . Knee surgery   . Coronary artery bypass graft     x 5 on August 29, 2008  . Tachecostomy placement   . Cholecystectomy 02/26/2012    Procedure: LAPAROSCOPIC CHOLECYSTECTOMY WITH INTRAOPERATIVE CHOLANGIOGRAM;  Surgeon: Lodema Pilot, DO;  Location: [facility] OR;  Service: General;  Laterality: N/A;    Family History  Problem Relation Age of Onset  . Coronary artery disease Other     positive  . Heart disease Mother   . Tuberculosis Father     Allergies  Allergen Reactions  . Losartan Potassium   . Naproxen   . Penicillins     Current Outpatient Prescriptions on File Prior to Visit  Medication Sig Dispense Refill  . acetaminophen (TYLENOL) 325 MG tablet Take 2 tablets (650 mg total) by mouth every 6 (six) hours as needed for pain or fever.      Marland Kitchen aspirin 81 MG tablet Take 81 mg by mouth daily.        . B Complex-C-Folic Acid (FOLBEE PLUS) TABS TAKE 1 TABLET EVERY DAY  30 tablet  6  . carvedilol (COREG) 3.125 MG tablet TAKE 1 TABLET TWICE A DAY WITH FOOD  180 tablet  3  . cholestyramine (QUESTRAN) 4 G packet Take 1 packet by mouth 3 (three) times daily with meals.      . cholestyramine light (PREVALITE) 4 G packet Take 1 packet (4 g total) by mouth 2 (two) times daily.  60 packet  3  . diazepam (VALIUM) 5 MG tablet Take 5 mg by mouth every 8 (eight) hours as needed. For anxiety      . levothyroxine (SYNTHROID, LEVOTHROID) 50 MCG tablet Take 1 tablet (50 mcg total) by mouth daily.  90 tablet  3  . lisinopril  (PRINIVIL,ZESTRIL) 10 MG tablet Take 1 tablet (10 mg total) by mouth daily.  90 tablet  3  . ranitidine (ZANTAC) 300 MG tablet Take 1 tablet (300 mg total) by mouth at bedtime.  90 tablet  3  . rosuvastatin (CRESTOR) 5 MG tablet Take 5 mg by mouth daily.        . Tamsulosin HCl (FLOMAX) 0.4 MG CAPS Take 1 capsule (0.4 mg total) by mouth daily after supper.  90 capsule  3  . Tamsulosin HCl (FLOMAX) 0.4 MG CAPS TAKE 1 CAPSULE AT BEDTIME  30 capsule  5    BP 140/82  Pulse 68  Temp 98.1 F (36.7 C)  Resp 16  Ht 6\' 2"  (1.88 m)  Wt 194 lb (87.998 kg)  BMI 24.91 kg/m2       Objective:   Physical Exam  Constitutional: He appears well-developed and well-nourished.  HENT:  Head: Normocephalic and atraumatic.  Eyes: Conjunctivae normal are normal. Pupils are equal, round, and reactive to light.  Neck: Normal range of motion. Neck supple.  Cardiovascular: Normal rate and regular rhythm.   Pulmonary/Chest: Effort normal and breath sounds normal.  Abdominal: Soft. Bowel sounds are normal.          Assessment & Plan:  Urinalysis and urine culture to analyze for possible urinary tract infection or chronic prostatitis.  Stable blood pressure current medications monitor thyroid and bmet today. A CBC and Differential will be monitored for post op anemia.  Return office visit in 3 months

## 2012-06-25 NOTE — Addendum Note (Signed)
Addended by: Rita Ohara R on: 06/25/2012 11:05 AM   Modules accepted: Orders

## 2012-06-27 ENCOUNTER — Other Ambulatory Visit: Payer: Self-pay | Admitting: Internal Medicine

## 2012-08-10 ENCOUNTER — Encounter: Payer: Self-pay | Admitting: Family

## 2012-08-10 ENCOUNTER — Ambulatory Visit (INDEPENDENT_AMBULATORY_CARE_PROVIDER_SITE_OTHER): Payer: Medicare Other | Admitting: Family

## 2012-08-10 VITALS — BP 142/90 | HR 88 | Wt 194.0 lb

## 2012-08-10 DIAGNOSIS — R300 Dysuria: Secondary | ICD-10-CM

## 2012-08-10 DIAGNOSIS — N410 Acute prostatitis: Secondary | ICD-10-CM

## 2012-08-10 LAB — POCT URINALYSIS DIPSTICK
Blood, UA: NEGATIVE
Glucose, UA: NEGATIVE
Ketones, UA: NEGATIVE
Spec Grav, UA: 1.02
Urobilinogen, UA: 0.2

## 2012-08-10 MED ORDER — CIPROFLOXACIN HCL 500 MG PO TABS: 500.0000 mg | ORAL_TABLET | Freq: Two times a day (BID) | ORAL | Status: DC

## 2012-08-10 NOTE — Progress Notes (Signed)
Subjective:    Patient ID: Zachary Decker, male    DOB: 12/13/1932, 79 y.o.   MRN: [id number]  HPI 79 year old in today with complaints of burning with urination x1 month. He saw Dr. Lovell Sheehan last month and had a urine culture done that was negative. However, he continues to have burning that waxes and wanes. He is sexually active with one male partner and denies any concerns any sexually transmitted diseases. Denies any abdominal pain or back pain.   Review of Systems  Constitutional: Negative.   Respiratory: Negative.   Cardiovascular: Negative.   Gastrointestinal: Negative.   Genitourinary: Positive for dysuria and frequency. Negative for hematuria, discharge, scrotal swelling, difficulty urinating and testicular pain.  Musculoskeletal: Negative.   Skin: Negative.   Neurological: Negative.   Hematological: Negative.   Psychiatric/Behavioral: Negative.    Past Medical History  Diagnosis Date  . CAD (coronary artery disease)   . Acute myocardial infarction of inferoposterior wall, subsequent episode of care   . Hypertension   . Hypercholesterolemia   . PVD (peripheral vascular disease)   . Personal history of thrombophlebitis   . Encounter for long-term (current) use of other medications   . Respiratory failure   . GERD (gastroesophageal reflux disease)   . Hypertrophy of prostate with urinary obstruction and other lower urinary tract symptoms (LUTS)   . Unspecified adverse effect of unspecified drug, medicinal and biological substance   . Unspecified hypothyroidism   . Family history of malignant neoplasm of gastrointestinal tract     History   Social History  . Marital Status: Married    Spouse Name: N/A    Number of Children: N/A  . Years of Education: N/A   Occupational History  . Not on file.   Social History Main Topics  . Smoking status: Never Smoker   . Smokeless tobacco: Not on file  . Alcohol Use: No  . Drug Use: No  . Sexually Active: Not on file    Other Topics Concern  . Not on file   Social History Narrative  . No narrative on file    Past Surgical History  Procedure Laterality Date  . Tonsillectomy    . Knee surgery    . Coronary artery bypass graft      x 5 on August 29, 2008  . Tachecostomy placement    . Cholecystectomy  02/26/2012    Procedure: LAPAROSCOPIC CHOLECYSTECTOMY WITH INTRAOPERATIVE CHOLANGIOGRAM;  Surgeon: Lodema Pilot, DO;  Location: [facility] OR;  Service: General;  Laterality: N/A;    Family History  Problem Relation Age of Onset  . Coronary artery disease Other     positive  . Heart disease Mother   . Tuberculosis Father     Allergies  Allergen Reactions  . Losartan Potassium   . Naproxen   . Penicillins     Current Outpatient Prescriptions on File Prior to Visit  Medication Sig Dispense Refill  . acetaminophen (TYLENOL) 325 MG tablet Take 2 tablets (650 mg total) by mouth every 6 (six) hours as needed for pain or fever.      Marland Kitchen aspirin 81 MG tablet Take 81 mg by mouth daily.        . B Complex-C-Folic Acid (FOLBEE PLUS) TABS TAKE 1 TABLET EVERY DAY  30 tablet  6  . B-Complex-C-Biotin-Minerals-FA (FOLBEE PLUS CZ) 5 MG TABS TAKE 1 TABLET EVERY DAY  30 tablet  3  . carvedilol (COREG) 3.125 MG tablet TAKE 1 TABLET TWICE A DAY WITH FOOD  180 tablet  3  . cholestyramine (QUESTRAN) 4 G packet Take 1 packet by mouth 3 (three) times daily with meals.      . cholestyramine light (PREVALITE) 4 G packet Take 1 packet (4 g total) by mouth 2 (two) times daily.  60 packet  3  . diazepam (VALIUM) 5 MG tablet Take 5 mg by mouth every 8 (eight) hours as needed. For anxiety      . levothyroxine (SYNTHROID, LEVOTHROID) 50 MCG tablet Take 1 tablet (50 mcg total) by mouth daily.  90 tablet  3  . lisinopril (PRINIVIL,ZESTRIL) 10 MG tablet Take 1 tablet (10 mg total) by mouth daily.  90 tablet  3  . ranitidine (ZANTAC) 300 MG tablet Take 1 tablet (300 mg total) by mouth at bedtime.  90 tablet  3  . rosuvastatin (CRESTOR) 5  MG tablet Take 5 mg by mouth daily.        . Tamsulosin HCl (FLOMAX) 0.4 MG CAPS Take 1 capsule (0.4 mg total) by mouth daily after supper.  90 capsule  3  . Tamsulosin HCl (FLOMAX) 0.4 MG CAPS TAKE 1 CAPSULE AT BEDTIME  30 capsule  5   No current facility-administered medications on file prior to visit.    BP 142/90  Pulse 88  Wt 194 lb (87.998 kg)  BMI 24.9 kg/m2  SpO2 97%chart    Objective:   Physical Exam  Constitutional: He is oriented to person, place, and time. He appears well-developed and well-nourished.  Neck: Normal range of motion. Neck supple.  Cardiovascular: Normal rate, regular rhythm and normal heart sounds.   Pulmonary/Chest: Effort normal and breath sounds normal.  Abdominal: Soft. Bowel sounds are normal.  Genitourinary: Rectum normal and penis normal. Guaiac negative stool. No penile tenderness.  Prostate slightly boggy. Mildly tender  Musculoskeletal: Normal range of motion.  Neurological: He is alert and oriented to person, place, and time.  Skin: Skin is warm and dry.  Psychiatric: He has a normal mood and affect.          Assessment & Plan:  Assessment: 1. Prostatitis-Acute 2. Dysuria  Plan: Cipro 500 mg one tablet twice a day x14 days. Patient advised to call the office if symptoms worsen or persist. Recheck a schedule, and as needed.

## 2012-08-10 NOTE — Patient Instructions (Addendum)
Prostatitis  The prostate gland is about the size and shape of a walnut. It is located just below your bladder. It produces one of the components of semen, which is made up of sperm and the fluids that help nourish and transport it out from the testicles. Prostatitis is redness, soreness, and swelling (inflammation) of the prostate gland.   There are 3 types of prostatitis:   Acute bacterial prostatitis This is the least common type of prostatitis. It starts quickly and usually leads to a bladder infection. It can occur at any age.   Chronic bacterial prostatitis This is a persistent bacterial infection in the prostate.It usually develops from repeated acute bacterial prostatitis or acute bacterial prostatitis that was not properly treated. It can occur in men of any age but is most common in middle-aged men whose prostate has begun to enlarge.   Chronic prostatitis chronic pelvic pain syndrome This is the most common type of prostatitis. It is inflammation of the prostate gland that is not caused by a bacterial infection. The cause is unknown.  CAUSES  The cause of acute and chronic bacterial prostatitis is a bacterial infection. The exact cause of chronic prostatitis and chronic pelvic pain syndrome and asymptomatic inflammatory prostatitis is unknown.   SYMPTOMS   Symptoms can vary depending upon the type of prostatitis that exists. There can also be overlap in symptoms. Possible symptoms for each type of prostatitis are listed below.  Acute bacterial prostatitis   Painful urination.   Fever or chills.   Muscle or joint pains.   Low back pain.   Low abdominal pain.   Inability to empty bladder completely.   Sudden urge to urinate.   Frequent urination.   Difficulty starting urine stream.   Weak urine stream.   Discharge from the urethra.   Dribbling after urination.   Rectal pain.   Pain in the testicles, penis, or tip of the penis.   Pain in the space between the anus and scrotum  (perineum).   Problems with sexual function.   Painful ejaculation.   Bloody semen.  Chronic bacterial prostatitis   The symptoms are similar to those of acute bacterial prostatitis, but they usually are much less severe. Fever, chills, and muscle and joint pain are not associated with chronic bacterial prostatitis.  Chronic prostatitis chronic pelvic pain syndrome   Symptoms typically include a dull ache in the scrotum and the perineum.  DIAGNOSIS   In order to diagnose prostatitis, your caregiver will ask about your symptoms. If acute or chronic bacterial prostatitis is suspected, a urine sample will be taken and tested (urinalysis). This is to see if there is bacteria in your urine. If the urinalysis result is negative for bacteria, your caregiver may use a finger to feel your prostate (digital rectal exam). This exam helps your caregiver determine if your prostate is swollen and tender.  TREATMENT   Treatment for prostatitis depends on the cause. If a bacterial infection is the cause, it can be treated with antibiotic medicine. In cases of chronic bacterial prostatitis, the use of antibiotics for up to 1 month may be necessary. Your caregiver may instruct you to take sitz baths to help relieve pain. A sitz bath is a bath of hot water in which your hips and buttocks are under water.  HOME CARE INSTRUCTIONS    Take all medicines as directed by your caregiver.   Take sitz baths as directed by your caregiver.  SEEK MEDICAL CARE IF:      Your symptoms get worse, not better.   You have a fever.  SEEK IMMEDIATE MEDICAL CARE IF:    You have chills.   You feel nauseous or vomit.   You feel lightheaded or faint.   You are unable to urinate.   You have blood or blood clots in your urine.  Document Released: 06/06/2000 Document Revised: 09/01/2011 Document Reviewed: 05/12/2011  [employer] Patient Information 2013 [employer].

## 2012-09-07 ENCOUNTER — Encounter: Payer: Self-pay | Admitting: Cardiology

## 2012-09-07 ENCOUNTER — Ambulatory Visit (INDEPENDENT_AMBULATORY_CARE_PROVIDER_SITE_OTHER): Payer: Medicare Other | Admitting: Cardiology

## 2012-09-07 VITALS — BP 128/82 | HR 57 | Ht 74.0 in | Wt 194.0 lb

## 2012-09-07 DIAGNOSIS — I2510 Atherosclerotic heart disease of native coronary artery without angina pectoris: Secondary | ICD-10-CM

## 2012-09-07 DIAGNOSIS — E7800 Pure hypercholesterolemia, unspecified: Secondary | ICD-10-CM

## 2012-09-07 DIAGNOSIS — I25810 Atherosclerosis of coronary artery bypass graft(s) without angina pectoris: Secondary | ICD-10-CM

## 2012-09-07 DIAGNOSIS — I10 Essential (primary) hypertension: Secondary | ICD-10-CM

## 2012-09-07 NOTE — Assessment & Plan Note (Signed)
Remains on low dose statin.

## 2012-09-07 NOTE — Assessment & Plan Note (Signed)
Controlled at present.  

## 2012-09-07 NOTE — Progress Notes (Signed)
HPI:  He is in for follow up.  He thinks he is slowing down a bit.  No current chest pain.  Feels good overall.  Goes to the gym about three days per week.  Hoping to get outside soon.    Current Outpatient Prescriptions  Medication Sig Dispense Refill  . acetaminophen (TYLENOL) 325 MG tablet Take 2 tablets (650 mg total) by mouth every 6 (six) hours as needed for pain or fever.      Marland Kitchen aspirin 81 MG tablet Take 81 mg by mouth daily.        Marland Kitchen B-Complex-C-Biotin-Minerals-FA (FOLBEE PLUS CZ) 5 MG TABS TAKE 1 TABLET EVERY DAY  30 tablet  3  . carvedilol (COREG) 3.125 MG tablet TAKE 1 TABLET TWICE A DAY WITH FOOD  180 tablet  3  . ciprofloxacin (CIPRO) 500 MG tablet Take 1 tablet (500 mg total) by mouth 2 (two) times daily.  28 tablet  0  . diazepam (VALIUM) 5 MG tablet Take 5 mg by mouth every 8 (eight) hours as needed. For anxiety      . levothyroxine (SYNTHROID, LEVOTHROID) 50 MCG tablet Take 1 tablet (50 mcg total) by mouth daily.  90 tablet  3  . lisinopril (PRINIVIL,ZESTRIL) 10 MG tablet Take 1 tablet (10 mg total) by mouth daily.  90 tablet  3  . ranitidine (ZANTAC) 300 MG tablet Take 1 tablet (300 mg total) by mouth at bedtime.  90 tablet  3  . rosuvastatin (CRESTOR) 5 MG tablet Take 5 mg by mouth daily.        . Tamsulosin HCl (FLOMAX) 0.4 MG CAPS Take 1 capsule (0.4 mg total) by mouth daily after supper.  90 capsule  3  . cholestyramine light (PREVALITE) 4 G packet Take 1 packet (4 g total) by mouth 2 (two) times daily.  60 packet  3   No current facility-administered medications for this visit.    Allergies  Allergen Reactions  . Losartan Potassium   . Naproxen   . Penicillins     Past Medical History  Diagnosis Date  . CAD (coronary artery disease)   . Acute myocardial infarction of inferoposterior wall, subsequent episode of care   . Hypertension   . Hypercholesterolemia   . PVD (peripheral vascular disease)   . Personal history of thrombophlebitis   . Encounter for  long-term (current) use of other medications   . Respiratory failure   . GERD (gastroesophageal reflux disease)   . Hypertrophy of prostate with urinary obstruction and other lower urinary tract symptoms (LUTS)   . Unspecified adverse effect of unspecified drug, medicinal and biological substance   . Unspecified hypothyroidism   . Family history of malignant neoplasm of gastrointestinal tract     Past Surgical History  Procedure Laterality Date  . Tonsillectomy    . Knee surgery    . Coronary artery bypass graft      x 5 on August 29, 2008  . Tachecostomy placement    . Cholecystectomy  02/26/2012    Procedure: LAPAROSCOPIC CHOLECYSTECTOMY WITH INTRAOPERATIVE CHOLANGIOGRAM;  Surgeon: Lodema Pilot, DO;  Location: [facility] OR;  Service: General;  Laterality: N/A;    Family History  Problem Relation Age of Onset  . Coronary artery disease Other     positive  . Heart disease Mother   . Tuberculosis Father     History   Social History  . Marital Status: Married    Spouse Name: N/A    Number of  Children: N/A  . Years of Education: N/A   Occupational History  . Not on file.   Social History Main Topics  . Smoking status: Never Smoker   . Smokeless tobacco: Not on file  . Alcohol Use: No  . Drug Use: No  . Sexually Active: Not on file   Other Topics Concern  . Not on file   Social History Narrative  . No narrative on file    ROS: Please see the HPI.  All other systems reviewed and negative.  PHYSICAL EXAM:  BP 128/82  Pulse 57  Ht 6\' 2"  (1.88 m)  Wt 194 lb (87.998 kg)  BMI 24.9 kg/m2  SpO2 99%  General: Well developed, well nourished, in no acute distress. Head:  Normocephalic and atraumatic. Neck: no JVD Lungs: Clear to auscultation and percussion. Heart: Normal S1 and S2.  S4 gallop.   Abdomen:  Normal bowel sounds; soft; non tender; no organomegaly Pulses: Pulses normal in all 4 extremities. Extremities: Not examined today.   Neurologic: Alert and oriented x  3.  EKG:  SB with first degree av block.  (PR 234 ms).  Inferior MI, age undetermined.  Minor T flattening.  No change from September 2013  ASSESSMENT AND PLAN:  1.  FU Dr. Clifton James.   2.  Continue meds.  BB limited by HR and AV conduction delay.

## 2012-09-07 NOTE — Assessment & Plan Note (Signed)
Continues to do well.  Had inferior MI complicated by VF arrest multiple times and unsuccessful attempt at PCI in the setting of severe MVD---urgent CABG.  Has subsequently done well.  Continues on medical therapy, with ASA, statin, Bblocker, and ACE.  Class I-II overall.  No change in treatment for now.

## 2012-09-07 NOTE — Patient Instructions (Addendum)
Your physician wants you to follow-up in:  6 months with Dr. Clifton James. You will receive a reminder letter in the mail two months in advance. If you don't receive a letter, please call our office to schedule the follow-up appointment.  Your physician recommends that you continue on your current medications as directed. Please refer to the Current Medication list given to you today.

## 2012-09-24 ENCOUNTER — Encounter: Payer: Self-pay | Admitting: Internal Medicine

## 2012-09-24 ENCOUNTER — Ambulatory Visit (INDEPENDENT_AMBULATORY_CARE_PROVIDER_SITE_OTHER): Payer: Medicare Other | Admitting: Internal Medicine

## 2012-09-24 VITALS — BP 130/80 | HR 58 | Temp 98.0°F | Resp 16 | Ht 74.0 in | Wt 196.0 lb

## 2012-09-24 DIAGNOSIS — R109 Unspecified abdominal pain: Secondary | ICD-10-CM | POA: Diagnosis not present

## 2012-09-24 DIAGNOSIS — E039 Hypothyroidism, unspecified: Secondary | ICD-10-CM

## 2012-09-24 DIAGNOSIS — I10 Essential (primary) hypertension: Secondary | ICD-10-CM

## 2012-09-24 DIAGNOSIS — R1030 Lower abdominal pain, unspecified: Secondary | ICD-10-CM

## 2012-09-24 DIAGNOSIS — E785 Hyperlipidemia, unspecified: Secondary | ICD-10-CM | POA: Diagnosis not present

## 2012-09-24 LAB — TSH: TSH: 1.42 u[IU]/mL (ref 0.35–5.50)

## 2012-09-24 LAB — LIPID PANEL
Cholesterol: 112 mg/dL (ref 0–200)
LDL Cholesterol: 56 mg/dL (ref 0–99)
Total CHOL/HDL Ratio: 3
Triglycerides: 91 mg/dL (ref 0.0–149.0)
VLDL: 18.2 mg/dL (ref 0.0–40.0)

## 2012-09-24 LAB — BASIC METABOLIC PANEL
CO2: 27 mEq/L (ref 19–32)
Chloride: 103 mEq/L (ref 96–112)
Potassium: 4.8 mEq/L (ref 3.5–5.1)
Sodium: 136 mEq/L (ref 135–145)

## 2012-09-24 LAB — HEPATIC FUNCTION PANEL
Albumin: 4.1 g/dL (ref 3.5–5.2)
Alkaline Phosphatase: 50 U/L (ref 39–117)
Total Protein: 7.3 g/dL (ref 6.0–8.3)

## 2012-09-24 NOTE — Progress Notes (Signed)
  Subjective:    Patient ID: Zachary Decker, male    DOB: March 29, 1933, 79 y.o.   MRN: [id number]  HPI Stable CV having seen Dr Tedra Senegal and will be establishing with new cardiology in [place] Stable blood pressure without chest pain or SOB Playing gold and working out Mild groin pull vs hernia. Pain in testical vs work out    Review of Systems  Constitutional: Positive for fatigue. Negative for fever.  HENT: Negative for hearing loss, congestion, neck pain and postnasal drip.   Eyes: Negative for discharge, redness and visual disturbance.  Respiratory: Negative for cough, shortness of breath and wheezing.   Cardiovascular: Positive for leg swelling.  Gastrointestinal: Negative for abdominal pain, constipation and abdominal distention.  Genitourinary: Positive for penile pain and testicular pain. Negative for urgency and frequency.  Musculoskeletal: Negative for joint swelling and arthralgias.  Skin: Negative for color change and rash.  Neurological: Negative for weakness and light-headedness.  Hematological: Negative for adenopathy.  Psychiatric/Behavioral: Negative for behavioral problems.       Objective:   Physical Exam  Nursing note and vitals reviewed. Constitutional: He appears well-developed and well-nourished.  HENT:  Head: Normocephalic and atraumatic.  Eyes: Conjunctivae are normal. Pupils are equal, round, and reactive to light.  Neck: Normal range of motion. Neck supple.  Cardiovascular: Normal rate and regular rhythm.   Murmur heard. bradycardic  Pulmonary/Chest: Effort normal and breath sounds normal.  Abdominal: Soft. Bowel sounds are normal.          Assessment & Plan:  On examination of his testicles he has a varicocele but more prominent on the right than the left.  He has more tenderness in the right testicle I do not appreciate masses in the testicles I do not appreciate any hernia. He had a prostate examination done recently and the symptoms have not  changed with an antibiotic. His blood pressure is stable from the cardiovascular standpoint he is having no signs of heart failure or chest pain indicating any active ischemia. We have recommended that he wear  athletic supporter and make sure that he exhales with lifting.  He is due a lipid and liver for appropriate monitoring as well as a TSH

## 2012-09-27 NOTE — Progress Notes (Signed)
Left message on machine.

## 2012-10-20 DIAGNOSIS — H43819 Vitreous degeneration, unspecified eye: Secondary | ICD-10-CM | POA: Diagnosis not present

## 2012-10-26 ENCOUNTER — Other Ambulatory Visit: Payer: Self-pay | Admitting: Internal Medicine

## 2012-11-08 ENCOUNTER — Ambulatory Visit (INDEPENDENT_AMBULATORY_CARE_PROVIDER_SITE_OTHER): Payer: Medicare Other

## 2012-11-08 ENCOUNTER — Other Ambulatory Visit: Payer: Self-pay | Admitting: Chiropractor

## 2012-11-08 DIAGNOSIS — M5137 Other intervertebral disc degeneration, lumbosacral region: Secondary | ICD-10-CM

## 2012-11-08 DIAGNOSIS — M1990 Unspecified osteoarthritis, unspecified site: Secondary | ICD-10-CM

## 2012-11-08 DIAGNOSIS — M4120 Other idiopathic scoliosis, site unspecified: Secondary | ICD-10-CM | POA: Diagnosis not present

## 2012-11-08 DIAGNOSIS — IMO0001 Reserved for inherently not codable concepts without codable children: Secondary | ICD-10-CM | POA: Diagnosis not present

## 2012-11-08 DIAGNOSIS — M999 Biomechanical lesion, unspecified: Secondary | ICD-10-CM | POA: Diagnosis not present

## 2012-11-11 DIAGNOSIS — IMO0001 Reserved for inherently not codable concepts without codable children: Secondary | ICD-10-CM | POA: Diagnosis not present

## 2012-11-11 DIAGNOSIS — M999 Biomechanical lesion, unspecified: Secondary | ICD-10-CM | POA: Diagnosis not present

## 2012-11-11 DIAGNOSIS — M5137 Other intervertebral disc degeneration, lumbosacral region: Secondary | ICD-10-CM | POA: Diagnosis not present

## 2012-11-17 DIAGNOSIS — M999 Biomechanical lesion, unspecified: Secondary | ICD-10-CM | POA: Diagnosis not present

## 2012-11-17 DIAGNOSIS — M5137 Other intervertebral disc degeneration, lumbosacral region: Secondary | ICD-10-CM | POA: Diagnosis not present

## 2012-11-17 DIAGNOSIS — IMO0001 Reserved for inherently not codable concepts without codable children: Secondary | ICD-10-CM | POA: Diagnosis not present

## 2012-11-18 DIAGNOSIS — M5137 Other intervertebral disc degeneration, lumbosacral region: Secondary | ICD-10-CM | POA: Diagnosis not present

## 2012-11-18 DIAGNOSIS — M999 Biomechanical lesion, unspecified: Secondary | ICD-10-CM | POA: Diagnosis not present

## 2012-11-18 DIAGNOSIS — IMO0001 Reserved for inherently not codable concepts without codable children: Secondary | ICD-10-CM | POA: Diagnosis not present

## 2012-11-22 DIAGNOSIS — IMO0001 Reserved for inherently not codable concepts without codable children: Secondary | ICD-10-CM | POA: Diagnosis not present

## 2012-11-22 DIAGNOSIS — M5137 Other intervertebral disc degeneration, lumbosacral region: Secondary | ICD-10-CM | POA: Diagnosis not present

## 2012-11-22 DIAGNOSIS — M999 Biomechanical lesion, unspecified: Secondary | ICD-10-CM | POA: Diagnosis not present

## 2012-11-25 ENCOUNTER — Other Ambulatory Visit: Payer: Self-pay | Admitting: Internal Medicine

## 2012-11-25 DIAGNOSIS — M999 Biomechanical lesion, unspecified: Secondary | ICD-10-CM | POA: Diagnosis not present

## 2012-11-25 DIAGNOSIS — M5137 Other intervertebral disc degeneration, lumbosacral region: Secondary | ICD-10-CM | POA: Diagnosis not present

## 2012-11-25 DIAGNOSIS — IMO0001 Reserved for inherently not codable concepts without codable children: Secondary | ICD-10-CM | POA: Diagnosis not present

## 2012-11-29 DIAGNOSIS — IMO0001 Reserved for inherently not codable concepts without codable children: Secondary | ICD-10-CM | POA: Diagnosis not present

## 2012-11-29 DIAGNOSIS — M999 Biomechanical lesion, unspecified: Secondary | ICD-10-CM | POA: Diagnosis not present

## 2012-11-29 DIAGNOSIS — M5137 Other intervertebral disc degeneration, lumbosacral region: Secondary | ICD-10-CM | POA: Diagnosis not present

## 2012-12-02 DIAGNOSIS — IMO0001 Reserved for inherently not codable concepts without codable children: Secondary | ICD-10-CM | POA: Diagnosis not present

## 2012-12-02 DIAGNOSIS — M5137 Other intervertebral disc degeneration, lumbosacral region: Secondary | ICD-10-CM | POA: Diagnosis not present

## 2012-12-02 DIAGNOSIS — M999 Biomechanical lesion, unspecified: Secondary | ICD-10-CM | POA: Diagnosis not present

## 2012-12-06 DIAGNOSIS — M5137 Other intervertebral disc degeneration, lumbosacral region: Secondary | ICD-10-CM | POA: Diagnosis not present

## 2012-12-06 DIAGNOSIS — IMO0001 Reserved for inherently not codable concepts without codable children: Secondary | ICD-10-CM | POA: Diagnosis not present

## 2012-12-06 DIAGNOSIS — M999 Biomechanical lesion, unspecified: Secondary | ICD-10-CM | POA: Diagnosis not present

## 2012-12-09 DIAGNOSIS — IMO0001 Reserved for inherently not codable concepts without codable children: Secondary | ICD-10-CM | POA: Diagnosis not present

## 2012-12-09 DIAGNOSIS — M999 Biomechanical lesion, unspecified: Secondary | ICD-10-CM | POA: Diagnosis not present

## 2012-12-09 DIAGNOSIS — M5137 Other intervertebral disc degeneration, lumbosacral region: Secondary | ICD-10-CM | POA: Diagnosis not present

## 2012-12-13 DIAGNOSIS — IMO0001 Reserved for inherently not codable concepts without codable children: Secondary | ICD-10-CM | POA: Diagnosis not present

## 2012-12-13 DIAGNOSIS — M5137 Other intervertebral disc degeneration, lumbosacral region: Secondary | ICD-10-CM | POA: Diagnosis not present

## 2012-12-13 DIAGNOSIS — M999 Biomechanical lesion, unspecified: Secondary | ICD-10-CM | POA: Diagnosis not present

## 2012-12-16 DIAGNOSIS — IMO0001 Reserved for inherently not codable concepts without codable children: Secondary | ICD-10-CM | POA: Diagnosis not present

## 2012-12-16 DIAGNOSIS — M999 Biomechanical lesion, unspecified: Secondary | ICD-10-CM | POA: Diagnosis not present

## 2012-12-16 DIAGNOSIS — M5137 Other intervertebral disc degeneration, lumbosacral region: Secondary | ICD-10-CM | POA: Diagnosis not present

## 2012-12-30 DIAGNOSIS — M999 Biomechanical lesion, unspecified: Secondary | ICD-10-CM | POA: Diagnosis not present

## 2012-12-30 DIAGNOSIS — IMO0001 Reserved for inherently not codable concepts without codable children: Secondary | ICD-10-CM | POA: Diagnosis not present

## 2012-12-30 DIAGNOSIS — M5137 Other intervertebral disc degeneration, lumbosacral region: Secondary | ICD-10-CM | POA: Diagnosis not present

## 2013-01-04 ENCOUNTER — Other Ambulatory Visit: Payer: Self-pay | Admitting: Internal Medicine

## 2013-01-15 ENCOUNTER — Other Ambulatory Visit: Payer: Self-pay | Admitting: Internal Medicine

## 2013-01-26 ENCOUNTER — Ambulatory Visit (INDEPENDENT_AMBULATORY_CARE_PROVIDER_SITE_OTHER): Payer: Medicare Other | Admitting: Internal Medicine

## 2013-01-26 ENCOUNTER — Encounter: Payer: Self-pay | Admitting: Internal Medicine

## 2013-01-26 VITALS — BP 110/70 | HR 64 | Temp 98.2°F | Resp 16 | Ht 74.0 in | Wt 192.0 lb

## 2013-01-26 DIAGNOSIS — I10 Essential (primary) hypertension: Secondary | ICD-10-CM | POA: Diagnosis not present

## 2013-01-26 DIAGNOSIS — E785 Hyperlipidemia, unspecified: Secondary | ICD-10-CM

## 2013-01-26 DIAGNOSIS — E039 Hypothyroidism, unspecified: Secondary | ICD-10-CM

## 2013-01-26 LAB — LIPID PANEL
Cholesterol: 111 mg/dL (ref 0–200)
HDL: 40.2 mg/dL (ref >39.00)
LDL Cholesterol: 52 mg/dL (ref 0–99)
Triglycerides: 95 mg/dL (ref 0.0–149.0)
VLDL: 19 mg/dL (ref 0.0–40.0)

## 2013-01-26 LAB — BASIC METABOLIC PANEL
BUN: 24 mg/dL — ABNORMAL HIGH (ref 6–23)
Chloride: 103 mEq/L (ref 96–112)
GFR: 50.08 mL/min — ABNORMAL LOW (ref >60.00)
Potassium: 6.3 mEq/L (ref 3.5–5.1)
Sodium: 138 mEq/L (ref 135–145)

## 2013-01-26 LAB — T4, FREE: Free T4: 1.06 ng/dL (ref 0.60–1.60)

## 2013-01-26 LAB — TSH: TSH: 1.74 u[IU]/mL (ref 0.35–5.50)

## 2013-01-26 NOTE — Patient Instructions (Signed)
Cut the lisinopril to 5 mg on "golf days"

## 2013-01-26 NOTE — Progress Notes (Signed)
Subjective:    Patient ID: Zachary Decker, male    DOB: Nov 19, 1932, 79 y.o.   MRN: [id number]  HPI    Patient is experiencing some lightheadedness when playing golf.  Part of this is due to playing in the heat for this his medications (possible dehydration.  His resting blood pressure is 110/70 General he is home readings have been in the 130/75 range. We will not adjust the Coreg because of the risk of CHF.  he  can try to cut the lisinopril from 5-10 and monitor his blood pressure His appointment with cardiologist who replaced Dr. Riley Kill who can review this recommendation     Review of Systems  Constitutional: Negative for fever and fatigue.  HENT: Negative for hearing loss, congestion, neck pain and postnasal drip.   Eyes: Negative for discharge, redness and visual disturbance.  Respiratory: Positive for shortness of breath. Negative for cough and wheezing.   Cardiovascular: Negative for leg swelling.  Gastrointestinal: Negative for abdominal pain, constipation and abdominal distention.  Genitourinary: Negative for urgency and frequency.  Musculoskeletal: Negative for joint swelling and arthralgias.  Skin: Negative for color change and rash.  Neurological: Positive for light-headedness. Negative for weakness.  Hematological: Negative for adenopathy.  Psychiatric/Behavioral: Negative for behavioral problems.   Past Medical History  Diagnosis Date  . CAD (coronary artery disease)   . Acute myocardial infarction of inferoposterior wall, subsequent episode of care   . Hypertension   . Hypercholesterolemia   . PVD (peripheral vascular disease)   . Personal history of thrombophlebitis   . Encounter for long-term (current) use of other medications   . Respiratory failure   . GERD (gastroesophageal reflux disease)   . Hypertrophy of prostate with urinary obstruction and other lower urinary tract symptoms (LUTS)   . Unspecified adverse effect of unspecified drug, medicinal  and biological substance   . Unspecified hypothyroidism   . Family history of malignant neoplasm of gastrointestinal tract     History   Social History  . Marital Status: Married    Spouse Name: N/A    Number of Children: N/A  . Years of Education: N/A   Occupational History  . Not on file.   Social History Main Topics  . Smoking status: Never Smoker   . Smokeless tobacco: Not on file  . Alcohol Use: No  . Drug Use: No  . Sexually Active: Not on file   Other Topics Concern  . Not on file   Social History Narrative  . No narrative on file    Past Surgical History  Procedure Laterality Date  . Tonsillectomy    . Knee surgery    . Coronary artery bypass graft      x 5 on August 29, 2008  . Tachecostomy placement    . Cholecystectomy  02/26/2012    Procedure: LAPAROSCOPIC CHOLECYSTECTOMY WITH INTRAOPERATIVE CHOLANGIOGRAM;  Surgeon: Lodema Pilot, DO;  Location: [facility] OR;  Service: General;  Laterality: N/A;    Family History  Problem Relation Age of Onset  . Coronary artery disease Other     positive  . Heart disease Mother   . Tuberculosis Father     Allergies  Allergen Reactions  . Losartan Potassium   . Naproxen   . Penicillins     Current Outpatient Prescriptions on File Prior to Visit  Medication Sig Dispense Refill  . acetaminophen (TYLENOL) 325 MG tablet Take 2 tablets (650 mg total) by mouth every 6 (six) hours as needed for pain  or fever.      Marland Kitchen aspirin 81 MG tablet Take 81 mg by mouth daily.        Marland Kitchen B-Complex-C-Biotin-Minerals-FA (FOLBEE PLUS CZ) 5 MG TABS TAKE 1 TABLET EVERY DAY  30 tablet  3  . carvedilol (COREG) 3.125 MG tablet TAKE 1 TABLET TWICE A DAY WITH FOOD  180 tablet  3  . diazepam (VALIUM) 5 MG tablet Take 5 mg by mouth every 8 (eight) hours as needed. For anxiety      . levothyroxine (SYNTHROID, LEVOTHROID) 50 MCG tablet Take 1 tablet (50 mcg total) by mouth daily.  90 tablet  3  . lisinopril (PRINIVIL,ZESTRIL) 10 MG tablet TAKE 1 TABLET BY  MOUTH DAILY.  90 tablet  3  . ranitidine (ZANTAC) 300 MG tablet TAKE 1 TABLET (300 MG TOTAL) BY MOUTH AT BEDTIME.  30 tablet  7  . rosuvastatin (CRESTOR) 5 MG tablet Take 5 mg by mouth daily.        . Tamsulosin HCl (FLOMAX) 0.4 MG CAPS Take 1 capsule (0.4 mg total) by mouth daily after supper.  90 capsule  3   No current facility-administered medications on file prior to visit.    BP 110/70  Pulse 64  Temp(Src) 98.2 F (36.8 C)  Resp 16  Ht 6\' 2"  (1.88 m)  Wt 192 lb (87.091 kg)  BMI 24.64 kg/m2       Objective:   Physical Exam  Constitutional: He appears well-developed and well-nourished.  HENT:  Head: Normocephalic and atraumatic.  Eyes: Conjunctivae are normal. Pupils are equal, round, and reactive to light.  Neck: Normal range of motion. Neck supple.  Cardiovascular: Normal rate and regular rhythm.   Murmur heard. No peripheral edema  Pulmonary/Chest: Effort normal and breath sounds normal.  Abdominal: Soft. Bowel sounds are normal.          Assessment & Plan:  The patient is going to play golf I will ask him to cut his lisinopril to 5 mg otherwise take the 10 mg dose  Patient seen a chiropractor for low back arthritic pain  moniter tsh, cbc and bmet and lipid

## 2013-01-27 ENCOUNTER — Other Ambulatory Visit: Payer: Self-pay | Admitting: Internal Medicine

## 2013-01-28 ENCOUNTER — Other Ambulatory Visit (INDEPENDENT_AMBULATORY_CARE_PROVIDER_SITE_OTHER): Payer: Medicare Other

## 2013-01-28 DIAGNOSIS — E875 Hyperkalemia: Secondary | ICD-10-CM | POA: Diagnosis not present

## 2013-01-28 LAB — BASIC METABOLIC PANEL
BUN: 30 mg/dL — ABNORMAL HIGH (ref 6–23)
CO2: 26 mEq/L (ref 19–32)
Calcium: 9.2 mg/dL (ref 8.4–10.5)
GFR: 52.58 mL/min — ABNORMAL LOW (ref >60.00)
Glucose, Bld: 101 mg/dL — ABNORMAL HIGH (ref 70–99)
Sodium: 138 mEq/L (ref 135–145)

## 2013-01-31 DIAGNOSIS — M5137 Other intervertebral disc degeneration, lumbosacral region: Secondary | ICD-10-CM | POA: Diagnosis not present

## 2013-01-31 DIAGNOSIS — IMO0001 Reserved for inherently not codable concepts without codable children: Secondary | ICD-10-CM | POA: Diagnosis not present

## 2013-01-31 DIAGNOSIS — M999 Biomechanical lesion, unspecified: Secondary | ICD-10-CM | POA: Diagnosis not present

## 2013-02-18 ENCOUNTER — Other Ambulatory Visit: Payer: Self-pay | Admitting: Internal Medicine

## 2013-02-23 ENCOUNTER — Encounter: Payer: Self-pay | Admitting: Cardiovascular Disease

## 2013-02-23 ENCOUNTER — Ambulatory Visit (INDEPENDENT_AMBULATORY_CARE_PROVIDER_SITE_OTHER): Payer: Medicare Other | Admitting: Cardiovascular Disease

## 2013-02-23 VITALS — BP 102/70 | HR 53 | Ht 74.0 in | Wt 192.0 lb

## 2013-02-23 DIAGNOSIS — I255 Ischemic cardiomyopathy: Secondary | ICD-10-CM

## 2013-02-23 DIAGNOSIS — I2510 Atherosclerotic heart disease of native coronary artery without angina pectoris: Secondary | ICD-10-CM

## 2013-02-23 DIAGNOSIS — I2589 Other forms of chronic ischemic heart disease: Secondary | ICD-10-CM | POA: Diagnosis not present

## 2013-02-23 MED ORDER — LISINOPRIL 5 MG PO TABS: 5.0000 mg | ORAL_TABLET | Freq: Every day | ORAL | Status: DC

## 2013-02-23 NOTE — Patient Instructions (Signed)
Your physician wants you to follow-up in:  6 months.You will receive a reminder letter in the mail two months in advance. If you don't receive a letter, please call our office to schedule the follow-up appointment.  Your physician has requested that you have a lexiscan myoview. For further information please visit [email or web address] Please follow instruction sheet, as given.   Your physician has recommended you make the following change in your medication: Decrease lisinopril to 5 mg by mouth daily

## 2013-02-23 NOTE — Progress Notes (Signed)
History of Present Illness: 79 yo male with history of HTN, HLD, CAD s/p 5V CABG March 2010, ischemic cardiomyopathy, GERD, BPH here today for follow up. He has been followed in the past by Dr. Riley Kill. Admitted March 2010 with inferior MI complicated by VF arrest multiple times and unsuccessful attempt at PCI in the setting of severe MVD---urgent 5 V CABG. He is known to have a cardiomyopathy with last echo 2013 showing LVEF 35-40%.   He tells me today that he has been feeling very tired and fatigued. No chest pain or SOB. Plays golf once per week. Former [employer].   Primary Care Physician: Darryll Capers  Last Lipid Profile:Lipid Panel     Component Value Date/Time   CHOL 111 01/26/2013 1053   TRIG 95.0 01/26/2013 1053   HDL 40.20 01/26/2013 1053   CHOLHDL 3 01/26/2013 1053   VLDL 19.0 01/26/2013 1053   LDLCALC 52 01/26/2013 1053     Past Medical History  Diagnosis Date  . CAD (coronary artery disease)   . Acute myocardial infarction of inferoposterior wall, subsequent episode of care   . Hypertension   . Hypercholesterolemia   . PVD (peripheral vascular disease)   . Personal history of thrombophlebitis   . Encounter for long-term (current) use of other medications   . Respiratory failure   . GERD (gastroesophageal reflux disease)   . Hypertrophy of prostate with urinary obstruction and other lower urinary tract symptoms (LUTS)   . Unspecified adverse effect of unspecified drug, medicinal and biological substance   . Unspecified hypothyroidism   . Family history of malignant neoplasm of gastrointestinal tract     Past Surgical History  Procedure Laterality Date  . Tonsillectomy    . Knee surgery    . Coronary artery bypass graft      x 5 on August 29, 2008  . Tachecostomy placement    . Cholecystectomy  02/26/2012    Procedure: LAPAROSCOPIC CHOLECYSTECTOMY WITH INTRAOPERATIVE CHOLANGIOGRAM;  Surgeon: Lodema Pilot, DO;  Location: [facility] OR;  Service: General;  Laterality: N/A;     Current Outpatient Prescriptions  Medication Sig Dispense Refill  . acetaminophen (TYLENOL) 325 MG tablet Take 2 tablets (650 mg total) by mouth every 6 (six) hours as needed for pain or fever.      Marland Kitchen aspirin 81 MG tablet Take 81 mg by mouth daily.        Marland Kitchen B-Complex-C-Biotin-Minerals-FA (FOLBEE PLUS CZ) 5 MG TABS TAKE 1 TABLET EVERY DAY  30 tablet  3  . carvedilol (COREG) 3.125 MG tablet TAKE 1 TABLET TWICE A DAY WITH FOOD  180 tablet  3  . diazepam (VALIUM) 5 MG tablet Take 5 mg by mouth every 8 (eight) hours as needed. For anxiety      . levothyroxine (SYNTHROID, LEVOTHROID) 50 MCG tablet TAKE 1 TABLET EVERY DAY  30 tablet  11  . lisinopril (PRINIVIL,ZESTRIL) 10 MG tablet TAKE 1 TABLET BY MOUTH DAILY.  90 tablet  3  . ranitidine (ZANTAC) 300 MG tablet TAKE 1 TABLET (300 MG TOTAL) BY MOUTH AT BEDTIME.  30 tablet  7  . rosuvastatin (CRESTOR) 5 MG tablet Take 5 mg by mouth daily.        . Tamsulosin HCl (FLOMAX) 0.4 MG CAPS Take 1 capsule (0.4 mg total) by mouth daily after supper.  90 capsule  3  . levothyroxine (SYNTHROID, LEVOTHROID) 50 MCG tablet Take 1 tablet (50 mcg total) by mouth daily.  90 tablet  3  No current facility-administered medications for this visit.    Allergies  Allergen Reactions  . Losartan Potassium   . Naproxen   . Penicillins     History   Social History  . Marital Status: Married    Spouse Name: N/A    Number of Children: N/A  . Years of Education: N/A   Occupational History  . Not on file.   Social History Main Topics  . Smoking status: Never Smoker   . Smokeless tobacco: Not on file  . Alcohol Use: No  . Drug Use: No  . Sexual Activity: Not on file   Other Topics Concern  . Not on file   Social History Narrative  . No narrative on file    Family History  Problem Relation Age of Onset  . Coronary artery disease Other     positive  . Heart disease Mother   . Tuberculosis Father   . Heart attack Mother     Review of Systems:   As stated in the HPI and otherwise negative.   BP 102/70  Pulse 53  Ht 6\' 2"  (1.88 m)  Wt 192 lb (87.091 kg)  BMI 24.64 kg/m2  Physical Examination: General: Well developed, well nourished, NAD HEENT: OP clear, mucus membranes moist SKIN: warm, dry. No rashes. Neuro: No focal deficits Musculoskeletal: Muscle strength 5/5 all ext Psychiatric: Mood and affect normal Neck: No JVD, no carotid bruits, no thyromegaly, no lymphadenopathy. Lungs:Clear bilaterally, no wheezes, rhonci, crackles Cardiovascular: Regular rate and rhythm. No murmurs, gallops or rubs. Abdomen:Soft. Bowel sounds present. Non-tender.  Extremities: No lower extremity edema. Pulses are 2 + in the bilateral DP/PT.  Echo 09/07/12:  Left ventricle: The cavity size was normal. Wall thickness was normal. Systolic function was moderately reduced. The estimated ejection fraction was in the range of 35% to 40%. There is akinesis of the inferoposterior myocardium. Doppler parameters are consistent with abnormal left ventricular relaxation (grade 1 diastolic dysfunction). - Mitral valve: Mild regurgitation. - Left atrium: The atrium was mildly dilated.  Assessment and Plan:   1. CAD: Stable. Recent fatigue so will arrange Lexiscan stress myoview to exclude ischemia. No ischemic testing since CABG in 2010. Continue beta blocker, ASA, statin, Ace-inh.   2. Ischemic Cardiomyopathy:  Continue medical therapy.   3. HTN:  Controlled and on low side. With recent fatigue, will reduce dose to 5 mg per day.   4. HYPERCHOLESTEROLEMIA: Remains on low dose statin.

## 2013-03-08 ENCOUNTER — Ambulatory Visit (HOSPITAL_COMMUNITY): Payer: Medicare Other | Attending: Cardiology | Admitting: Radiology

## 2013-03-08 VITALS — BP 105/61 | Ht 74.0 in | Wt 190.0 lb

## 2013-03-08 DIAGNOSIS — R5381 Other malaise: Secondary | ICD-10-CM | POA: Diagnosis not present

## 2013-03-08 DIAGNOSIS — I739 Peripheral vascular disease, unspecified: Secondary | ICD-10-CM | POA: Diagnosis not present

## 2013-03-08 DIAGNOSIS — I252 Old myocardial infarction: Secondary | ICD-10-CM | POA: Diagnosis not present

## 2013-03-08 DIAGNOSIS — I2510 Atherosclerotic heart disease of native coronary artery without angina pectoris: Secondary | ICD-10-CM | POA: Diagnosis not present

## 2013-03-08 DIAGNOSIS — I255 Ischemic cardiomyopathy: Secondary | ICD-10-CM

## 2013-03-08 DIAGNOSIS — R0609 Other forms of dyspnea: Secondary | ICD-10-CM | POA: Insufficient documentation

## 2013-03-08 DIAGNOSIS — Z8249 Family history of ischemic heart disease and other diseases of the circulatory system: Secondary | ICD-10-CM | POA: Diagnosis not present

## 2013-03-08 DIAGNOSIS — Z951 Presence of aortocoronary bypass graft: Secondary | ICD-10-CM | POA: Diagnosis not present

## 2013-03-08 DIAGNOSIS — I10 Essential (primary) hypertension: Secondary | ICD-10-CM | POA: Diagnosis not present

## 2013-03-08 DIAGNOSIS — R0989 Other specified symptoms and signs involving the circulatory and respiratory systems: Secondary | ICD-10-CM | POA: Insufficient documentation

## 2013-03-08 MED ORDER — REGADENOSON 0.4 MG/5ML IV SOLN
0.4000 mg | Freq: Once | INTRAVENOUS | Status: AC
  Administered 2013-03-08: 0.4 mg via INTRAVENOUS

## 2013-03-08 MED ORDER — TECHNETIUM TC 99M SESTAMIBI GENERIC - CARDIOLITE
30.0000 | Freq: Once | INTRAVENOUS | Status: AC | PRN
  Administered 2013-03-08: 30 via INTRAVENOUS

## 2013-03-08 MED ORDER — TECHNETIUM TC 99M SESTAMIBI GENERIC - CARDIOLITE
10.0000 | Freq: Once | INTRAVENOUS | Status: AC | PRN
  Administered 2013-03-08: 10 via INTRAVENOUS

## 2013-03-08 NOTE — Progress Notes (Signed)
[facility] SITE 3 NUCLEAR MED [address] [place], [place] [address] [phone]    Cardiology Nuclear Med Study  Zachary Decker is a 79 y.o. male     MRN : [id number]     DOB: 07/18/1932  Procedure Date: 03/08/2013  Nuclear Med Background Indication for Stress Test:  Evaluation for Ischemia and Graft Patency History:  '10 MI>Cardiac Arrest (VF)> Cath> CABG, EF=40-45%, and 08-2011 Echo: EF=35-40% Cardiac Risk Factors: Family History - CAD, Hypertension, Lipids and PVD  Symptoms:  DOE and Fatigue   Nuclear Pre-Procedure Caffeine/Decaff Intake:  None > 12 hrs NPO After: 5:30pm   Lungs:  clear O2 Sat: 97% on room air. IV 0.9% NS with Angio Cath:  20g  IV Site: R Wrist x 1, tolerated well IV Started by:  Irean Hong, RN  Chest Size (in):  46 Cup Size: n/a  Height: 6\' 2"  (1.88 m)  Weight:  190 lb (86.183 kg)  BMI:  Body mass index is 24.38 kg/(m^2). Tech Comments:  Patient complained of diarrhea x 4 since 0400 today ? Anxiety per wife. Patient feels OK and wants to do test. BP 98/60 HR 68 O2 Sat 98% RA. IV 0.9% NACL hung open. Irean Hong, RN    Nuclear Med Study 1 or 2 day study: 1 day  Stress Test Type:  Eugenie Birks  Reading MD: Cassell Clement, MD  Order Authorizing Provider:  Verne Carrow, MD  Resting Radionuclide: Technetium 38m Sestamibi  Resting Radionuclide Dose: 11.0 mCi   Stress Radionuclide:  Technetium 85m Sestamibi  Stress Radionuclide Dose: 33.0 mCi           Stress Protocol Rest HR: 69 Stress HR: 78  Rest BP: 105/61 Stress BP: 113/61  Exercise Time (min): n/a METS: n/a   Predicted Max HR: 143 bpm % Max HR: 54.55 bpm Rate Pressure Product: 8814   Dose of Adenosine (mg):  n/a Dose of Lexiscan: 0.4 mg  Dose of Atropine (mg): n/a Dose of Dobutamine: n/a mcg/kg/min (at max HR)  Stress Test Technologist: Milana Na, EMT-P  Nuclear Technologist:  Doyne Keel, CNMT     Rest Procedure:  Myocardial perfusion imaging was  performed at rest 45 minutes following the intravenous administration of Technetium 53m Sestamibi. Rest ECG: NSR. Old inferior wall MI.  Stress Procedure:  The patient received IV Lexiscan 0.4 mg over 15-seconds.  Technetium 92m Sestamibi injected at 30-seconds. This patient had sob and weak the Lexiscan injection. Quantitative spect images were obtained after a 45 minute delay. Stress ECG: No significant change from baseline ECG  QPS Raw Data Images:  Normal; no motion artifact; normal heart/lung ratio. Stress Images:  There is decreased uptake in the inferior wall and lateral wall Rest Images:  There is decreased uptake in the inferior wall and lateral wall. Subtraction (SDS):  There is a fixed defect that is most consistent with a previous infarction. Transient Ischemic Dilatation (Normal <1.22):  NA Lung/Heart Ratio (Normal <0.45):  0.49  Quantitative Gated Spect Images QGS EDV:  141 ml QGS ESV:  76 ml  Impression Exercise Capacity:  Lexiscan with no exercise. BP Response:  Hypotensive blood pressure response. Clinical Symptoms:  There is dyspnea. ECG Impression:  No significant ST segment change suggestive of ischemia. Comparison with Prior Nuclear Study: No images to compare  Overall Impression:  Intermediate risk stress nuclear study. There is a large fixed perfusion defect involving the apex, inferior wall and lateral wall consistent with old MI. There is no  significant reversible ischemia. There is moderate LV systolic dysfunction with EF 46%.  LV Ejection Fraction: 46%.  LV Wall Motion:  Inferior wall severe hypokinesis.  Cassell Clement

## 2013-03-15 ENCOUNTER — Ambulatory Visit (INDEPENDENT_AMBULATORY_CARE_PROVIDER_SITE_OTHER): Payer: Medicare Other

## 2013-03-15 DIAGNOSIS — Z23 Encounter for immunization: Secondary | ICD-10-CM | POA: Diagnosis not present

## 2013-05-18 ENCOUNTER — Other Ambulatory Visit: Payer: Self-pay | Admitting: Internal Medicine

## 2013-06-19 ENCOUNTER — Other Ambulatory Visit: Payer: Self-pay | Admitting: Internal Medicine

## 2013-06-20 ENCOUNTER — Other Ambulatory Visit: Payer: Self-pay | Admitting: Internal Medicine

## 2013-07-29 ENCOUNTER — Encounter: Payer: Self-pay | Admitting: Internal Medicine

## 2013-07-29 ENCOUNTER — Ambulatory Visit (INDEPENDENT_AMBULATORY_CARE_PROVIDER_SITE_OTHER): Payer: Medicare Other | Admitting: Internal Medicine

## 2013-07-29 VITALS — BP 122/72 | HR 52 | Temp 97.8°F | Resp 16 | Ht 74.0 in | Wt 192.0 lb

## 2013-07-29 DIAGNOSIS — R5381 Other malaise: Secondary | ICD-10-CM

## 2013-07-29 DIAGNOSIS — E039 Hypothyroidism, unspecified: Secondary | ICD-10-CM

## 2013-07-29 DIAGNOSIS — I10 Essential (primary) hypertension: Secondary | ICD-10-CM

## 2013-07-29 DIAGNOSIS — K219 Gastro-esophageal reflux disease without esophagitis: Secondary | ICD-10-CM

## 2013-07-29 DIAGNOSIS — I2589 Other forms of chronic ischemic heart disease: Secondary | ICD-10-CM

## 2013-07-29 DIAGNOSIS — E785 Hyperlipidemia, unspecified: Secondary | ICD-10-CM | POA: Diagnosis not present

## 2013-07-29 DIAGNOSIS — R5383 Other fatigue: Secondary | ICD-10-CM

## 2013-07-29 LAB — CBC WITH DIFFERENTIAL/PLATELET
BASOS ABS: 0 10*3/uL (ref 0.0–0.1)
Basophils Relative: 0.6 % (ref 0.0–3.0)
EOS PCT: 5.2 % — AB (ref 0.0–5.0)
Eosinophils Absolute: 0.4 10*3/uL (ref 0.0–0.7)
HEMATOCRIT: 42 % (ref 39.0–52.0)
Hemoglobin: 13.9 g/dL (ref 13.0–17.0)
LYMPHS PCT: 25.7 % (ref 12.0–46.0)
Lymphs Abs: 2 10*3/uL (ref 0.7–4.0)
MCHC: 33 g/dL (ref 30.0–36.0)
MCV: 101.2 fl — ABNORMAL HIGH (ref 78.0–100.0)
MONOS PCT: 10.3 % (ref 3.0–12.0)
Monocytes Absolute: 0.8 10*3/uL (ref 0.1–1.0)
NEUTROS PCT: 58.2 % (ref 43.0–77.0)
Neutro Abs: 4.5 10*3/uL (ref 1.4–7.7)
PLATELETS: 232 10*3/uL (ref 150.0–400.0)
RBC: 4.15 Mil/uL — ABNORMAL LOW (ref 4.22–5.81)
RDW: 12.6 % (ref 11.5–14.6)
WBC: 7.7 10*3/uL (ref 4.5–10.5)

## 2013-07-29 LAB — TSH: TSH: 1.85 u[IU]/mL (ref 0.35–5.50)

## 2013-07-29 NOTE — Progress Notes (Signed)
Subjective:    Patient ID: Zachary Decker, male    DOB: 08/16/1932, 80 y.o.   MRN: [id number]  Coronary Artery Disease Symptoms include shortness of breath. Pertinent negatives include no chest pain, chest tightness or leg swelling. Risk factors include hypertension.  Gastrophageal Reflux He reports no abdominal pain or no chest pain. Associated symptoms include fatigue.  Hypertension Associated symptoms include shortness of breath. Pertinent negatives include no chest pain.  Back Pain Associated symptoms include weakness. Pertinent negatives include no abdominal pain or chest pain.      Review of Systems  Constitutional: Positive for fatigue.  HENT: Positive for postnasal drip and voice change.   Eyes: Negative.   Respiratory: Positive for shortness of breath. Negative for apnea and chest tightness.   Cardiovascular: Negative for chest pain and leg swelling.  Gastrointestinal: Positive for constipation. Negative for abdominal pain and abdominal distention.  Endocrine: Negative.   Genitourinary: Positive for urgency and frequency.  Musculoskeletal: Positive for back pain, myalgias and neck stiffness.  Neurological: Positive for weakness and light-headedness.   . Past Medical History  Diagnosis Date  . CAD (coronary artery disease)   . Acute myocardial infarction of inferoposterior wall, subsequent episode of care   . Hypertension   . Hypercholesterolemia   . PVD (peripheral vascular disease)   . Personal history of thrombophlebitis   . Encounter for long-term (current) use of other medications   . Respiratory failure   . GERD (gastroesophageal reflux disease)   . Hypertrophy of prostate with urinary obstruction and other lower urinary tract symptoms (LUTS)   . Unspecified adverse effect of unspecified drug, medicinal and biological substance   . Unspecified hypothyroidism   . Family history of malignant neoplasm of gastrointestinal tract     History   Social  History  . Marital Status: Married    Spouse Name: N/A    Number of Children: N/A  . Years of Education: N/A   Occupational History  . Not on file.   Social History Main Topics  . Smoking status: Never Smoker   . Smokeless tobacco: Not on file  . Alcohol Use: No  . Drug Use: No  . Sexual Activity: Not on file   Other Topics Concern  . Not on file   Social History Narrative  . No narrative on file    Past Surgical History  Procedure Laterality Date  . Tonsillectomy    . Knee surgery    . Coronary artery bypass graft      x 5 on August 29, 2008  . Tachecostomy placement    . Cholecystectomy  02/26/2012    Procedure: LAPAROSCOPIC CHOLECYSTECTOMY WITH INTRAOPERATIVE CHOLANGIOGRAM;  Surgeon: Madilyn Hook, DO;  Location: [facility] OR;  Service: General;  Laterality: N/A;    Family History  Problem Relation Age of Onset  . Coronary artery disease Other     positive  . Heart disease Mother   . Tuberculosis Father   . Heart attack Mother     Allergies  Allergen Reactions  . Losartan Potassium   . Naproxen   . Penicillins     Current Outpatient Prescriptions on File Prior to Visit  Medication Sig Dispense Refill  . aspirin 81 MG tablet Take 81 mg by mouth daily.        . B Complex-C-Folic Acid (FOLBEE PLUS) TABS TAKE 1 TABLET EVERY DAY  30 tablet  3  . carvedilol (COREG) 3.125 MG tablet TAKE 1 TABLET TWICE A DAY WITH FOOD  180 tablet  3  . diazepam (VALIUM) 5 MG tablet Take 5 mg by mouth every 8 (eight) hours as needed. For anxiety      . levothyroxine (SYNTHROID, LEVOTHROID) 50 MCG tablet TAKE 1 TABLET EVERY DAY  30 tablet  11  . lisinopril (PRINIVIL,ZESTRIL) 5 MG tablet Take 1 tablet (5 mg total) by mouth daily.  90 tablet  3  . ranitidine (ZANTAC) 300 MG tablet TAKE 1 TABLET (300 MG TOTAL) BY MOUTH AT BEDTIME.  30 tablet  7  . rosuvastatin (CRESTOR) 5 MG tablet Take 5 mg by mouth daily.        . tamsulosin (FLOMAX) 0.4 MG CAPS capsule TAKE ONE CAPSULE BY MOUTH AT BEDTIME  30  capsule  5   No current facility-administered medications on file prior to visit.    BP 122/72  Pulse 52  Temp(Src) 97.8 F (36.6 C)  Resp 16  Ht 6\' 2"  (1.88 m)  Wt 192 lb (87.091 kg)  BMI 24.64 kg/m2       Objective:   Physical Exam  Nursing note and vitals reviewed. Constitutional: He is oriented to person, place, and time. He appears well-developed and well-nourished.  HENT:  Head: Normocephalic and atraumatic.  Cardiovascular: Normal rate and regular rhythm.   Murmur heard. Pulmonary/Chest: Effort normal and breath sounds normal.  Abdominal: Soft. There is tenderness.  Musculoskeletal: He exhibits edema and tenderness.  Neurological: He is alert and oriented to person, place, and time.          Assessment & Plan:  Stable HTN with medications reductions and feels less "dizzy" only one reported fall but no injury Back and MSK stiffness when sitting is still an issue and increases fall risk Initially reported hypokalemia monitor for renal function and potassium on current medications.  Lipid and liver due screening.  Due to some increased fatigue and positional dizziness CBC is also warranted

## 2013-07-29 NOTE — Progress Notes (Signed)
Pre visit review using our clinic review tool, if applicable. No additional management support is needed unless otherwise documented below in the visit note. 

## 2013-07-29 NOTE — Patient Instructions (Signed)
The patient is instructed to continue all medications as prescribed. Schedule followup with check out clerk upon leaving the clinic   BP 122/72  Pulse 52  Temp(Src) 97.8 F (36.6 C)  Resp 16  Ht 6\' 2"  (1.88 m)  Wt 192 lb (87.091 kg)  BMI 24.64 kg/m2

## 2013-08-01 ENCOUNTER — Telehealth: Payer: Self-pay | Admitting: Internal Medicine

## 2013-08-01 LAB — LIPID PANEL
CHOL/HDL RATIO: 3
Cholesterol: 108 mg/dL (ref 0–200)
HDL: 41.9 mg/dL (ref >39.00)
LDL CALC: 53 mg/dL (ref 0–99)
Triglycerides: 66 mg/dL (ref 0.0–149.0)
VLDL: 13.2 mg/dL (ref 0.0–40.0)

## 2013-08-01 LAB — HEPATIC FUNCTION PANEL
ALBUMIN: 4.1 g/dL (ref 3.5–5.2)
ALK PHOS: 44 U/L (ref 39–117)
ALT: 34 U/L (ref 0–53)
AST: 46 U/L — AB (ref 0–37)
BILIRUBIN DIRECT: 0.1 mg/dL (ref 0.0–0.3)
Total Bilirubin: 0.8 mg/dL (ref 0.3–1.2)
Total Protein: 6.8 g/dL (ref 6.0–8.3)

## 2013-08-01 LAB — BASIC METABOLIC PANEL
BUN: 22 mg/dL (ref 6–23)
CO2: 27 meq/L (ref 19–32)
Calcium: 9.1 mg/dL (ref 8.4–10.5)
Chloride: 105 mEq/L (ref 96–112)
Creatinine, Ser: 1.3 mg/dL (ref 0.4–1.5)
GFR: 58.81 mL/min — AB (ref >60.00)
GLUCOSE: 88 mg/dL (ref 70–99)
POTASSIUM: 4.8 meq/L (ref 3.5–5.1)
SODIUM: 138 meq/L (ref 135–145)

## 2013-08-01 NOTE — Telephone Encounter (Signed)
Relevant patient education assigned to patient using Emmi. ° °

## 2013-08-22 ENCOUNTER — Encounter: Payer: Self-pay | Admitting: Cardiovascular Disease

## 2013-08-22 ENCOUNTER — Ambulatory Visit (INDEPENDENT_AMBULATORY_CARE_PROVIDER_SITE_OTHER): Payer: Medicare Other | Admitting: Cardiovascular Disease

## 2013-08-22 VITALS — BP 140/78 | HR 60 | Ht 74.0 in | Wt 192.0 lb

## 2013-08-22 DIAGNOSIS — I10 Essential (primary) hypertension: Secondary | ICD-10-CM

## 2013-08-22 DIAGNOSIS — E7800 Pure hypercholesterolemia, unspecified: Secondary | ICD-10-CM | POA: Diagnosis not present

## 2013-08-22 DIAGNOSIS — I2510 Atherosclerotic heart disease of native coronary artery without angina pectoris: Secondary | ICD-10-CM

## 2013-08-22 DIAGNOSIS — I255 Ischemic cardiomyopathy: Secondary | ICD-10-CM

## 2013-08-22 DIAGNOSIS — I2589 Other forms of chronic ischemic heart disease: Secondary | ICD-10-CM | POA: Diagnosis not present

## 2013-08-22 MED ORDER — NITROGLYCERIN 0.4 MG SL SUBL: 0.4000 mg | SUBLINGUAL_TABLET | SUBLINGUAL | Status: DC | PRN

## 2013-08-22 NOTE — Progress Notes (Signed)
History of Present Illness: 80 yo male with history of HTN, HLD, CAD s/p 5V CABG March 2010, ischemic cardiomyopathy, GERD, BPH here today for follow up. He has been followed in the past by Dr. Lia Foyer. Admitted March 2010 with inferior MI complicated by VF arrest multiple times and unsuccessful attempt at PCI in the setting of severe MVD---urgent 5 V CABG. He is known to have a cardiomyopathy with last echo 2013 showing LVEF 35-40%. Stress myoview 03/08/13 with scar but no ischemia, LVEF=46%.  He is here today for follow up. He tells me that he has been feeling well. He has been exercising three days per week. He has been using the elliptical, weights.  No chest pain or SOB. Plays golf once per week. 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   Primary Care Physician: Benay Pillow  Last Lipid Profile:Lipid Panel     Component Value Date/Time   CHOL 108 07/29/2013 1051   TRIG 66.0 07/29/2013 1051   HDL 41.90 07/29/2013 1051   CHOLHDL 3 07/29/2013 1051   VLDL 13.2 07/29/2013 1051   LDLCALC 53 07/29/2013 1051     Past Medical History  Diagnosis Date  . CAD (coronary artery disease)   . Acute myocardial infarction of inferoposterior wall, subsequent episode of care   . Hypertension   . Hypercholesterolemia   . PVD (peripheral vascular disease)   . Personal history of thrombophlebitis   . Encounter for long-term (current) use of other medications   . Respiratory failure   . GERD (gastroesophageal reflux disease)   . Hypertrophy of prostate with urinary obstruction and other lower urinary tract symptoms (LUTS)   . Unspecified adverse effect of unspecified drug, medicinal and biological substance   . Unspecified hypothyroidism   . Family history of malignant neoplasm of gastrointestinal tract     Past Surgical History  Procedure Laterality Date  . Tonsillectomy    . Knee surgery    . Coronary artery bypass graft      x 5 on August 29, 2008  . Tachecostomy placement    . Cholecystectomy  02/26/2012   Procedure: LAPAROSCOPIC CHOLECYSTECTOMY WITH INTRAOPERATIVE CHOLANGIOGRAM;  Surgeon: Madilyn Hook, DO;  Location: [facility];  Service: General;  Laterality: N/A;    Current Outpatient Prescriptions  Medication Sig Dispense Refill  . aspirin 81 MG tablet Take 81 mg by mouth daily.        . B Complex-C-Folic Acid (FOLBEE PLUS) TABS TAKE 1 TABLET EVERY DAY  30 tablet  3  . carvedilol (COREG) 3.125 MG tablet TAKE 1 TABLET TWICE A DAY WITH FOOD  180 tablet  3  . diazepam (VALIUM) 5 MG tablet Take 5 mg by mouth every 8 (eight) hours as needed. For anxiety      . levothyroxine (SYNTHROID, LEVOTHROID) 50 MCG tablet TAKE 1 TABLET EVERY DAY  30 tablet  11  . lisinopril (PRINIVIL,ZESTRIL) 5 MG tablet Take 1 tablet (5 mg total) by mouth daily.  90 tablet  3  . ranitidine (ZANTAC) 300 MG tablet TAKE 1 TABLET (300 MG TOTAL) BY MOUTH AT BEDTIME.  30 tablet  7  . rosuvastatin (CRESTOR) 5 MG tablet Take 5 mg by mouth daily.        . tamsulosin (FLOMAX) 0.4 MG CAPS capsule TAKE ONE CAPSULE BY MOUTH AT BEDTIME  30 capsule  5   No current facility-administered medications for this visit.    Allergies  Allergen Reactions  . Losartan Potassium   . Naproxen   . Penicillins  History   Social History  . Marital Status: Married    Spouse Name: N/A    Number of Children: N/A  . Years of Education: N/A   Occupational History  . Not on file.   Social History Main Topics  . Smoking status: Never Smoker   . Smokeless tobacco: Not on file  . Alcohol Use: No  . Drug Use: No  . Sexual Activity: Not on file   Other Topics Concern  . Not on file   Social History Narrative  . No narrative on file    Family History  Problem Relation Age of Onset  . Coronary artery disease Other     positive  . Heart disease Mother   . Tuberculosis Father   . Heart attack Mother     Review of Systems:  As stated in the HPI and otherwise negative.   BP 140/78  Pulse 60  Ht 6\' 2"  (1.88 m)  Wt 192 lb (87.091 kg)   BMI 24.64 kg/m2  Physical Examination: General: Well developed, well nourished, NAD HEENT: OP clear, mucus membranes moist SKIN: warm, dry. No rashes. Neuro: No focal deficits Musculoskeletal: Muscle strength 5/5 all ext Psychiatric: Mood and affect normal Neck: No JVD, no carotid bruits, no thyromegaly, no lymphadenopathy. Lungs:Clear bilaterally, no wheezes, rhonci, crackles Cardiovascular: Regular rate and rhythm. No murmurs, gallops or rubs. Abdomen:Soft. Bowel sounds present. Non-tender.  Extremities: No lower extremity edema. Pulses are 2 + in the bilateral DP/PT.  Echo 09/07/12:  Left ventricle: The cavity size was normal. Wall thickness was normal. Systolic function was moderately reduced. The estimated ejection fraction was in the range of 35% to 40%. There is akinesis of the inferoposterior myocardium. Doppler parameters are consistent with abnormal left ventricular relaxation (grade 1 diastolic dysfunction). - Mitral valve: Mild regurgitation. - Left atrium: The atrium was mildly dilated.  Stress myoview 03/08/13: Stress Procedure: The patient received IV Lexiscan 0.4 mg over 15-seconds. Technetium 21m Sestamibi injected at 30-seconds. This patient had sob and weak the Lexiscan injection. Quantitative spect images were obtained after a 45 minute delay.  Stress ECG: No significant change from baseline ECG  QPS  Raw Data Images: Normal; no motion artifact; normal heart/lung ratio.  Stress Images: There is decreased uptake in the inferior wall and lateral wall  Rest Images: There is decreased uptake in the inferior wall and lateral wall.  Subtraction (SDS): There is a fixed defect that is most consistent with a previous infarction.  Transient Ischemic Dilatation (Normal <1.22): NA  Lung/Heart Ratio (Normal <0.45): 0.49  Quantitative Gated Spect Images  QGS EDV: 141 ml  QGS ESV: 76 ml  Impression  Exercise Capacity: Lexiscan with no exercise.  BP Response: Hypotensive  blood pressure response.  Clinical Symptoms: There is dyspnea.  ECG Impression: No significant ST segment change suggestive of ischemia.  Comparison with Prior Nuclear Study: No images to compare  Overall Impression: Intermediate risk stress nuclear study. There is a large fixed perfusion defect involving the apex, inferior wall and lateral wall consistent with old MI. There is no significant reversible ischemia. There is moderate LV systolic dysfunction with EF 46%.  LV Ejection Fraction: 46%. LV Wall Motion: Inferior wall severe hypokinesis.  EKG: Sinus, 1st degree AV block. Old inferior infarct. Unchanged.   Assessment and Plan:   1. CAD: Stable. Lexiscan stress myoview without ischemia 03/08/13. Continue beta blocker, ASA, statin, Ace-inh.   2. Ischemic Cardiomyopathy:  LVEF=46% by stress myoview 2014. Continue medical therapy.  3. HTN:  Controlled. No changes today.   4. HYPERCHOLESTEROLEMIA: Remains on low dose statin. Lipids are well controlled. LFTs unchanged February 2015.

## 2013-08-22 NOTE — Addendum Note (Signed)
Addended by: Milderd Meager on: 08/22/2013 09:56 AM   Modules accepted: Orders

## 2013-08-22 NOTE — Patient Instructions (Signed)
Your physician wants you to follow-up in:  6 months. You will receive a reminder letter in the mail two months in advance. If you don't receive a letter, please call our office to schedule the follow-up appointment.   

## 2013-10-07 ENCOUNTER — Telehealth: Payer: Self-pay | Admitting: Cardiovascular Disease

## 2013-10-07 NOTE — Telephone Encounter (Signed)
Walk In Pt Form "Application For Disability PlaCard" dropped off gave to [facility]

## 2013-10-10 NOTE — Telephone Encounter (Signed)
Completed disability parking placard form mailed to pt

## 2013-10-20 ENCOUNTER — Other Ambulatory Visit: Payer: Self-pay | Admitting: Internal Medicine

## 2013-11-17 ENCOUNTER — Other Ambulatory Visit: Payer: Self-pay | Admitting: Internal Medicine

## 2013-11-23 DIAGNOSIS — H43819 Vitreous degeneration, unspecified eye: Secondary | ICD-10-CM | POA: Diagnosis not present

## 2013-12-30 ENCOUNTER — Ambulatory Visit: Payer: Medicare Other | Admitting: Internal Medicine

## 2014-01-02 ENCOUNTER — Other Ambulatory Visit: Payer: Self-pay | Admitting: Internal Medicine

## 2014-01-13 ENCOUNTER — Other Ambulatory Visit: Payer: Self-pay | Admitting: Internal Medicine

## 2014-01-23 ENCOUNTER — Ambulatory Visit (INDEPENDENT_AMBULATORY_CARE_PROVIDER_SITE_OTHER): Payer: Medicare Other | Admitting: Family Medicine

## 2014-01-23 ENCOUNTER — Encounter: Payer: Self-pay | Admitting: Family Medicine

## 2014-01-23 VITALS — BP 120/78 | HR 64 | Temp 97.4°F | Wt 188.0 lb

## 2014-01-23 DIAGNOSIS — R197 Diarrhea, unspecified: Secondary | ICD-10-CM | POA: Diagnosis not present

## 2014-01-23 LAB — COMPREHENSIVE METABOLIC PANEL
ALBUMIN: 3.8 g/dL (ref 3.5–5.2)
ALT: 26 U/L (ref 0–53)
AST: 34 U/L (ref 0–37)
Alkaline Phosphatase: 50 U/L (ref 39–117)
BILIRUBIN TOTAL: 1 mg/dL (ref 0.2–1.2)
BUN: 19 mg/dL (ref 6–23)
CO2: 23 meq/L (ref 19–32)
Calcium: 8.7 mg/dL (ref 8.4–10.5)
Chloride: 100 mEq/L (ref 96–112)
Creatinine, Ser: 1.3 mg/dL (ref 0.4–1.5)
GFR: 57.16 mL/min — ABNORMAL LOW (ref >60.00)
GLUCOSE: 103 mg/dL — AB (ref 70–99)
Potassium: 3.9 mEq/L (ref 3.5–5.1)
SODIUM: 131 meq/L — AB (ref 135–145)
TOTAL PROTEIN: 6.7 g/dL (ref 6.0–8.3)

## 2014-01-23 LAB — CBC WITH DIFFERENTIAL/PLATELET
Basophils Absolute: 0 10*3/uL (ref 0.0–0.1)
Basophils Relative: 0.4 % (ref 0.0–3.0)
EOS PCT: 2.5 % (ref 0.0–5.0)
Eosinophils Absolute: 0.2 10*3/uL (ref 0.0–0.7)
HEMATOCRIT: 39.5 % (ref 39.0–52.0)
Hemoglobin: 13.2 g/dL (ref 13.0–17.0)
Lymphocytes Relative: 20.4 % (ref 12.0–46.0)
Lymphs Abs: 1.8 10*3/uL (ref 0.7–4.0)
MCHC: 33.5 g/dL (ref 30.0–36.0)
MCV: 99.5 fl (ref 78.0–100.0)
MONOS PCT: 10 % (ref 3.0–12.0)
Monocytes Absolute: 0.9 10*3/uL (ref 0.1–1.0)
NEUTROS PCT: 66.7 % (ref 43.0–77.0)
Neutro Abs: 5.9 10*3/uL (ref 1.4–7.7)
PLATELETS: 268 10*3/uL (ref 150.0–400.0)
RBC: 3.97 Mil/uL — ABNORMAL LOW (ref 4.22–5.81)
RDW: 12.5 % (ref 11.5–15.5)
WBC: 8.8 10*3/uL (ref 4.0–10.5)

## 2014-01-23 NOTE — Progress Notes (Signed)
Garret Reddish, MD Phone: [phone]  Subjective:   Zachary Decker is a 80 y.o. year old very pleasant male patient who presents with the following:  Diarrhea History of gallbladder issues 5 years ago. 2 years ago had gallbladder removed (had drain afterwards for 1 week).  3 weeks of almost being constipated then 1 week of loose in cycles since that time. Every so often would have very severe diarrhea for less than a week. Started taking probiotic pill 2-3 weeks ago (taking 1 daily instead of once a week as instructions said) to see if this could regulate his cycles and prevent constipation .  Since that time, he has had intermittent periods of diarrhea for over a week. Started taking immodium about a week ago when had severe diarrhea and this would resolve the issue. Last night had another episode of diarrhea (8-10 bowel movements in 8 hour period) and took imodium and resolved.   ROS-denies abdominal pain, fever/chills/nausea/vomiting. Some mild fatigue. Some decreased appetite.  Denies recent antibiotic use. No brbpr no melena. Denies lightheadedness.   Past Medical History- history of c. Diff 2 years ago, no recent travel or camping in the woods, no history of being immunocompromised.  Patient Active Problem List   Diagnosis Date Noted  . CAD w/ hx MI s/p CABG 09/07/2012    Priority: High  . CARDIOMYOPATHY, ISCHEMIC 03/22/2010    Priority: High  . PVD 10/31/2008    Priority: High  . Hx of DVT 10/31/2008    Priority: High  . BASAL CELL CARCINOMA, NOSE 02/05/2009    Priority: Medium  . HYPOTHYROIDISM 03/26/2007    Priority: Medium  . HYPERTENSION 03/22/2007    Priority: Medium  . ANEMIA, VITAMIN B12 DEFICIENCY 04/22/2010    Priority: Low  . Actinic keratosis 06/25/2009    Priority: Low  . Loc osteoarth NOS-l/leg 12/06/2008    Priority: Low  . HYPERCHOLESTEROLEMIA 10/31/2008    Priority: Low  . BENIGN PROSTATIC HYPERTROPHY, WITH URINARY OBSTRUCTION 09/28/2007   Priority: Low  . GERD 03/22/2007    Priority: Low   Medications- reviewed and updated Current Outpatient Prescriptions  Medication Sig Dispense Refill  . aspirin 81 MG tablet Take 81 mg by mouth daily.        . B Complex-C-Folic Acid (FOLBEE PLUS) TABS TAKE 1 TABLET EVERY DAY  30 tablet  3  . carvedilol (COREG) 3.125 MG tablet TAKE 1 TABLET TWICE A DAY WITH FOOD  180 tablet  3  . levothyroxine (SYNTHROID, LEVOTHROID) 50 MCG tablet TAKE 1 TABLET EVERY DAY  30 tablet  11  . lisinopril (PRINIVIL,ZESTRIL) 5 MG tablet Take 1 tablet (5 mg total) by mouth daily.  90 tablet  3  . ranitidine (ZANTAC) 300 MG tablet TAKE 1 TABLET (300 MG TOTAL) BY MOUTH AT BEDTIME.  30 tablet  7  . rosuvastatin (CRESTOR) 5 MG tablet Take 5 mg by mouth daily.        . tamsulosin (FLOMAX) 0.4 MG CAPS capsule TAKE ONE CAPSULE BY MOUTH AT BEDTIME  30 capsule  5  . diazepam (VALIUM) 5 MG tablet Take 5 mg by mouth every 8 (eight) hours as needed. For anxiety      . nitroGLYCERIN (NITROSTAT) 0.4 MG SL tablet Place 1 tablet (0.4 mg total) under the tongue every 5 (five) minutes as needed for chest pain.  25 tablet  6   No current facility-administered medications for this visit.    Objective: BP 120/78  Pulse 64  Temp(Src)  97.4 F (36.3 C)  Wt 188 lb (85.276 kg) Gen: NAD, resting comfortably on table HEENT: mildly dry tongue  CV: RRR no murmurs rubs or gallops Lungs: CTAB no crackles, wheeze, rhonchi Abdomen: soft/nontender/nondistended/some increased activity of bowel sounds. No rebound or guarding.  Ext: no edema Skin: warm, dry, no rash  Results for orders placed in visit on 01/23/14 (from the past 24 hour(s))  COMPREHENSIVE METABOLIC PANEL     Status: Abnormal   Collection Time    01/23/14  1:30 PM      Result Value Ref Range   Sodium 131 (*) 135 - 145 mEq/L   Potassium 3.9  3.5 - 5.1 mEq/L   Chloride 100  96 - 112 mEq/L   CO2 23  19 - 32 mEq/L   Glucose, Bld 103 (*) 70 - 99 mg/dL   BUN 19  6 - 23 mg/dL    Creatinine, Ser 1.3  0.4 - 1.5 mg/dL   Total Bilirubin 1.0  0.2 - 1.2 mg/dL   Alkaline Phosphatase 50  39 - 117 U/L   AST 34  0 - 37 U/L   ALT 26  0 - 53 U/L   Total Protein 6.7  6.0 - 8.3 g/dL   Albumin 3.8  3.5 - 5.2 g/dL   Calcium 8.7  8.4 - 10.5 mg/dL   GFR 57.16 (*) >60.00 mL/min  CBC WITH DIFFERENTIAL     Status: Abnormal   Collection Time    01/23/14  1:30 PM      Result Value Ref Range   WBC 8.8  4.0 - 10.5 K/uL   RBC 3.97 (*) 4.22 - 5.81 Mil/uL   Hemoglobin 13.2  13.0 - 17.0 g/dL   HCT 39.5  39.0 - 52.0 %   MCV 99.5  78.0 - 100.0 fl   MCHC 33.5  30.0 - 36.0 g/dL   RDW 12.5  11.5 - 15.5 %   Platelets 268.0  150.0 - 400.0 K/uL   Neutrophils Relative % 66.7  43.0 - 77.0 %   Lymphocytes Relative 20.4  12.0 - 46.0 %   Monocytes Relative 10.0  3.0 - 12.0 %   Eosinophils Relative 2.5  0.0 - 5.0 %   Basophils Relative 0.4  0.0 - 3.0 %   Neutro Abs 5.9  1.4 - 7.7 K/uL   Lymphs Abs 1.8  0.7 - 4.0 K/uL   Monocytes Absolute 0.9  0.1 - 1.0 K/uL   Eosinophils Absolute 0.2  0.0 - 0.7 K/uL   Basophils Absolute 0.0  0.0 - 0.1 K/uL    Assessment/Plan:  Diarrhea Unknown etiology (infectious? Vs probiotic effects vs. Due to cholecystectomy or multifactorial). Asked patient to avoid probiotic for now. Will check c diff, stool culture, and fecal lactoferrin to evaluate for infectious causes. WBC not elevated may indicate less likely infectious. Mild hyponatremia reflective of dehydration-discussed efforts to continue to stay hydrated/replenish. Creatine not elevated from baseline fortunately. As no brbpr and no fever, discussed patient may continue to use imodium prn. Discussed f/u next week if persistent or sooner if worsens  Orders Placed This Encounter  Procedures  . C. difficile, PCR  . Stool culture  . Comprehensive metabolic panel    [place]  . CBC with Differential  . Fecal lactoferrin

## 2014-01-23 NOTE — Patient Instructions (Signed)
Diarrhea  Not clear if this is gallbladder related or due to probiotic overuse or infection  Check stool to make sure there is no bacteria. Check blood labs for dehydration and signs of infection.   Can take imodium as desired if no fevers, blood in stool  Follow up as needed if symptoms worsen or persist into next week or for other reasons we discussed

## 2014-01-31 ENCOUNTER — Other Ambulatory Visit: Payer: Self-pay | Admitting: Internal Medicine

## 2014-02-01 LAB — FECAL LACTOFERRIN, QUANT: Lactoferrin: NEGATIVE

## 2014-02-01 LAB — CLOSTRIDIUM DIFFICILE BY PCR: Toxigenic C. Difficile by PCR: NOT DETECTED

## 2014-02-03 LAB — STOOL CULTURE

## 2014-02-07 DIAGNOSIS — IMO0002 Reserved for concepts with insufficient information to code with codable children: Secondary | ICD-10-CM | POA: Diagnosis not present

## 2014-02-07 DIAGNOSIS — M5124 Other intervertebral disc displacement, thoracic region: Secondary | ICD-10-CM | POA: Diagnosis not present

## 2014-02-07 DIAGNOSIS — M5126 Other intervertebral disc displacement, lumbar region: Secondary | ICD-10-CM | POA: Diagnosis not present

## 2014-02-07 DIAGNOSIS — M999 Biomechanical lesion, unspecified: Secondary | ICD-10-CM | POA: Diagnosis not present

## 2014-02-08 DIAGNOSIS — IMO0002 Reserved for concepts with insufficient information to code with codable children: Secondary | ICD-10-CM | POA: Diagnosis not present

## 2014-02-08 DIAGNOSIS — M5126 Other intervertebral disc displacement, lumbar region: Secondary | ICD-10-CM | POA: Diagnosis not present

## 2014-02-08 DIAGNOSIS — M5124 Other intervertebral disc displacement, thoracic region: Secondary | ICD-10-CM | POA: Diagnosis not present

## 2014-02-08 DIAGNOSIS — M999 Biomechanical lesion, unspecified: Secondary | ICD-10-CM | POA: Diagnosis not present

## 2014-02-10 DIAGNOSIS — M999 Biomechanical lesion, unspecified: Secondary | ICD-10-CM | POA: Diagnosis not present

## 2014-02-10 DIAGNOSIS — M5124 Other intervertebral disc displacement, thoracic region: Secondary | ICD-10-CM | POA: Diagnosis not present

## 2014-02-10 DIAGNOSIS — IMO0002 Reserved for concepts with insufficient information to code with codable children: Secondary | ICD-10-CM | POA: Diagnosis not present

## 2014-02-10 DIAGNOSIS — M5126 Other intervertebral disc displacement, lumbar region: Secondary | ICD-10-CM | POA: Diagnosis not present

## 2014-02-12 ENCOUNTER — Other Ambulatory Visit: Payer: Self-pay | Admitting: Internal Medicine

## 2014-02-14 DIAGNOSIS — M999 Biomechanical lesion, unspecified: Secondary | ICD-10-CM | POA: Diagnosis not present

## 2014-02-14 DIAGNOSIS — M5126 Other intervertebral disc displacement, lumbar region: Secondary | ICD-10-CM | POA: Diagnosis not present

## 2014-02-14 DIAGNOSIS — IMO0002 Reserved for concepts with insufficient information to code with codable children: Secondary | ICD-10-CM | POA: Diagnosis not present

## 2014-02-14 DIAGNOSIS — M5124 Other intervertebral disc displacement, thoracic region: Secondary | ICD-10-CM | POA: Diagnosis not present

## 2014-02-15 DIAGNOSIS — M5126 Other intervertebral disc displacement, lumbar region: Secondary | ICD-10-CM | POA: Diagnosis not present

## 2014-02-15 DIAGNOSIS — M5124 Other intervertebral disc displacement, thoracic region: Secondary | ICD-10-CM | POA: Diagnosis not present

## 2014-02-15 DIAGNOSIS — IMO0002 Reserved for concepts with insufficient information to code with codable children: Secondary | ICD-10-CM | POA: Diagnosis not present

## 2014-02-15 DIAGNOSIS — M999 Biomechanical lesion, unspecified: Secondary | ICD-10-CM | POA: Diagnosis not present

## 2014-02-16 ENCOUNTER — Ambulatory Visit (INDEPENDENT_AMBULATORY_CARE_PROVIDER_SITE_OTHER): Payer: Medicare Other | Admitting: Cardiovascular Disease

## 2014-02-16 ENCOUNTER — Ambulatory Visit: Payer: Medicare Other | Admitting: Cardiovascular Disease

## 2014-02-16 ENCOUNTER — Encounter: Payer: Self-pay | Admitting: Cardiovascular Disease

## 2014-02-16 VITALS — BP 130/62 | HR 50 | Ht 74.0 in | Wt 184.0 lb

## 2014-02-16 DIAGNOSIS — I10 Essential (primary) hypertension: Secondary | ICD-10-CM | POA: Diagnosis not present

## 2014-02-16 DIAGNOSIS — I255 Ischemic cardiomyopathy: Secondary | ICD-10-CM

## 2014-02-16 DIAGNOSIS — E7800 Pure hypercholesterolemia, unspecified: Secondary | ICD-10-CM

## 2014-02-16 DIAGNOSIS — I2589 Other forms of chronic ischemic heart disease: Secondary | ICD-10-CM | POA: Diagnosis not present

## 2014-02-16 DIAGNOSIS — I2510 Atherosclerotic heart disease of native coronary artery without angina pectoris: Secondary | ICD-10-CM | POA: Diagnosis not present

## 2014-02-16 NOTE — Progress Notes (Signed)
History of Present Illness: 80 yo male with history of HTN, HLD, CAD s/p 5V CABG March 2010, ischemic cardiomyopathy, GERD, BPH here today for follow up. He has been followed in the past by Dr. Lia Foyer. Admitted March 2010 with inferior MI complicated by VF arrest multiple times and unsuccessful attempt at PCI in the setting of severe multi-vessel CAD. He then had an urgent 5 V CABG. He is known to have a cardiomyopathy with last echo 2013 showing LVEF 35-40%. Stress myoview 03/08/13 with scar but no ischemia, LVEF=46%.  He is here today for follow up. He tells me that he has been feeling well. No chest pain or SOB. He continues to exercise three days per week. He has been using the elliptical, weights.  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   Primary Care Physician: Garret Reddish  Last Lipid Profile:Lipid Panel     Component Value Date/Time   CHOL 108 07/29/2013 1051   TRIG 66.0 07/29/2013 1051   HDL 41.90 07/29/2013 1051   CHOLHDL 3 07/29/2013 1051   VLDL 13.2 07/29/2013 1051   LDLCALC 53 07/29/2013 1051    Past Medical History  Diagnosis Date  . CAD (coronary artery disease)   . Acute myocardial infarction of inferoposterior wall, subsequent episode of care   . Hypertension   . Hypercholesterolemia   . PVD (peripheral vascular disease)   . Personal history of thrombophlebitis   . Encounter for long-term (current) use of other medications   . Respiratory failure   . GERD (gastroesophageal reflux disease)   . Hypertrophy of prostate with urinary obstruction and other lower urinary tract symptoms (LUTS)   . Unspecified adverse effect of unspecified drug, medicinal and biological substance   . Unspecified hypothyroidism   . Family history of malignant neoplasm of gastrointestinal tract     Past Surgical History  Procedure Laterality Date  . Tonsillectomy    . Knee surgery    . Coronary artery bypass graft      x 5 on August 29, 2008  . Tachecostomy placement    . Cholecystectomy  02/26/2012   Procedure: LAPAROSCOPIC CHOLECYSTECTOMY WITH INTRAOPERATIVE CHOLANGIOGRAM;  Surgeon: Madilyn Hook, DO;  Location: [facility];  Service: General;  Laterality: N/A;    Current Outpatient Prescriptions  Medication Sig Dispense Refill  . aspirin 81 MG tablet Take 81 mg by mouth daily.        . B Complex-C-Folic Acid (FOLBEE PLUS) TABS TAKE 1 TABLET EVERY DAY  30 tablet  2  . carvedilol (COREG) 3.125 MG tablet TAKE 1 TABLET TWICE A DAY WITH FOOD  180 tablet  3  . diazepam (VALIUM) 5 MG tablet Take 5 mg by mouth every 8 (eight) hours as needed. For anxiety      . levothyroxine (SYNTHROID, LEVOTHROID) 50 MCG tablet TAKE 1 TABLET EVERY DAY  30 tablet  11  . lisinopril (PRINIVIL,ZESTRIL) 5 MG tablet Take 1 tablet (5 mg total) by mouth daily.  90 tablet  3  . nitroGLYCERIN (NITROSTAT) 0.4 MG SL tablet Place 1 tablet (0.4 mg total) under the tongue every 5 (five) minutes as needed for chest pain.  25 tablet  6  . ranitidine (ZANTAC) 300 MG tablet TAKE 1 TABLET (300 MG TOTAL) BY MOUTH AT BEDTIME.  30 tablet  6  . rosuvastatin (CRESTOR) 5 MG tablet Take 5 mg by mouth daily.        . tamsulosin (FLOMAX) 0.4 MG CAPS capsule TAKE ONE CAPSULE BY MOUTH AT BEDTIME  30  capsule  5   No current facility-administered medications for this visit.    Allergies  Allergen Reactions  . Losartan Potassium   . Naproxen   . Penicillins     History   Social History  . Marital Status: Married    Spouse Name: N/A    Number of Children: N/A  . Years of Education: N/A   Occupational History  . Not on file.   Social History Main Topics  . Smoking status: Never Smoker   . Smokeless tobacco: Not on file  . Alcohol Use: No  . Drug Use: No  . Sexual Activity: Not on file   Other Topics Concern  . Not on file   Social History Narrative  . No narrative on file    Family History  Problem Relation Age of Onset  . Coronary artery disease Other     positive  . Heart disease Mother   . Tuberculosis Father   .  Heart attack Mother     Review of Systems:  As stated in the HPI and otherwise negative.   BP 130/62  Pulse 50  Ht 6\' 2"  (1.88 m)  Wt 184 lb (83.462 kg)  BMI 23.61 kg/m2  SpO2 99%  Physical Examination: General: Well developed, well nourished, NAD HEENT: OP clear, mucus membranes moist SKIN: warm, dry. No rashes. Neuro: No focal deficits Musculoskeletal: Muscle strength 5/5 all ext Psychiatric: Mood and affect normal Neck: No JVD, no carotid bruits, no thyromegaly, no lymphadenopathy. Lungs:Clear bilaterally, no wheezes, rhonci, crackles Cardiovascular: Regular rate and rhythm. No murmurs, gallops or rubs. Abdomen:Soft. Bowel sounds present. Non-tender.  Extremities: No lower extremity edema. Pulses are 2 + in the bilateral DP/PT.  Echo 09/07/12:  Left ventricle: The cavity size was normal. Wall thickness was normal. Systolic function was moderately reduced. The estimated ejection fraction was in the range of 35% to 40%. There is akinesis of the inferoposterior myocardium. Doppler parameters are consistent with abnormal left ventricular relaxation (grade 1 diastolic dysfunction). - Mitral valve: Mild regurgitation. - Left atrium: The atrium was mildly dilated.  Stress myoview 03/08/13: Stress Procedure: The patient received IV Lexiscan 0.4 mg over 15-seconds. Technetium 48m Sestamibi injected at 30-seconds. This patient had sob and weak the Lexiscan injection. Quantitative spect images were obtained after a 45 minute delay.  Stress ECG: No significant change from baseline ECG  QPS  Raw Data Images: Normal; no motion artifact; normal heart/lung ratio.  Stress Images: There is decreased uptake in the inferior wall and lateral wall  Rest Images: There is decreased uptake in the inferior wall and lateral wall.  Subtraction (SDS): There is a fixed defect that is most consistent with a previous infarction.  Transient Ischemic Dilatation (Normal <1.22): NA  Lung/Heart Ratio (Normal  <0.45): 0.49  Quantitative Gated Spect Images  QGS EDV: 141 ml  QGS ESV: 76 ml  Impression  Exercise Capacity: Lexiscan with no exercise.  BP Response: Hypotensive blood pressure response.  Clinical Symptoms: There is dyspnea.  ECG Impression: No significant ST segment change suggestive of ischemia.  Comparison with Prior Nuclear Study: No images to compare  Overall Impression: Intermediate risk stress nuclear study. There is a large fixed perfusion defect involving the apex, inferior wall and lateral wall consistent with old MI. There is no significant reversible ischemia. There is moderate LV systolic dysfunction with EF 46%.  LV Ejection Fraction: 46%. LV Wall Motion: Inferior wall severe hypokinesis.  Assessment and Plan:   1. CAD: Stable. Lexiscan stress  myoview without ischemia 03/08/13. Continue beta blocker, ASA, statin, Ace-inh.   2. Ischemic Cardiomyopathy:  LVEF=46% by stress myoview 2014. Continue medical therapy.   3. HTN:  Controlled. No changes today.   4. HYPERCHOLESTEROLEMIA: Remains on low dose statin. Lipids are well controlled.

## 2014-02-16 NOTE — Patient Instructions (Signed)
Your physician wants you to follow-up in:  12 months.  You will receive a reminder letter in the mail two months in advance. If you don't receive a letter, please call our office to schedule the follow-up appointment.   

## 2014-02-17 DIAGNOSIS — M5126 Other intervertebral disc displacement, lumbar region: Secondary | ICD-10-CM | POA: Diagnosis not present

## 2014-02-17 DIAGNOSIS — IMO0002 Reserved for concepts with insufficient information to code with codable children: Secondary | ICD-10-CM | POA: Diagnosis not present

## 2014-02-17 DIAGNOSIS — M999 Biomechanical lesion, unspecified: Secondary | ICD-10-CM | POA: Diagnosis not present

## 2014-02-17 DIAGNOSIS — M5124 Other intervertebral disc displacement, thoracic region: Secondary | ICD-10-CM | POA: Diagnosis not present

## 2014-02-20 DIAGNOSIS — IMO0002 Reserved for concepts with insufficient information to code with codable children: Secondary | ICD-10-CM | POA: Diagnosis not present

## 2014-02-20 DIAGNOSIS — M5124 Other intervertebral disc displacement, thoracic region: Secondary | ICD-10-CM | POA: Diagnosis not present

## 2014-02-20 DIAGNOSIS — M999 Biomechanical lesion, unspecified: Secondary | ICD-10-CM | POA: Diagnosis not present

## 2014-02-20 DIAGNOSIS — M5126 Other intervertebral disc displacement, lumbar region: Secondary | ICD-10-CM | POA: Diagnosis not present

## 2014-02-22 DIAGNOSIS — M5126 Other intervertebral disc displacement, lumbar region: Secondary | ICD-10-CM | POA: Diagnosis not present

## 2014-02-22 DIAGNOSIS — M5124 Other intervertebral disc displacement, thoracic region: Secondary | ICD-10-CM | POA: Diagnosis not present

## 2014-02-22 DIAGNOSIS — IMO0002 Reserved for concepts with insufficient information to code with codable children: Secondary | ICD-10-CM | POA: Diagnosis not present

## 2014-02-22 DIAGNOSIS — M999 Biomechanical lesion, unspecified: Secondary | ICD-10-CM | POA: Diagnosis not present

## 2014-02-23 DIAGNOSIS — M5126 Other intervertebral disc displacement, lumbar region: Secondary | ICD-10-CM | POA: Diagnosis not present

## 2014-02-23 DIAGNOSIS — M5124 Other intervertebral disc displacement, thoracic region: Secondary | ICD-10-CM | POA: Diagnosis not present

## 2014-02-23 DIAGNOSIS — M999 Biomechanical lesion, unspecified: Secondary | ICD-10-CM | POA: Diagnosis not present

## 2014-02-23 DIAGNOSIS — IMO0002 Reserved for concepts with insufficient information to code with codable children: Secondary | ICD-10-CM | POA: Diagnosis not present

## 2014-02-28 DIAGNOSIS — M5124 Other intervertebral disc displacement, thoracic region: Secondary | ICD-10-CM | POA: Diagnosis not present

## 2014-02-28 DIAGNOSIS — M999 Biomechanical lesion, unspecified: Secondary | ICD-10-CM | POA: Diagnosis not present

## 2014-02-28 DIAGNOSIS — IMO0002 Reserved for concepts with insufficient information to code with codable children: Secondary | ICD-10-CM | POA: Diagnosis not present

## 2014-02-28 DIAGNOSIS — M5126 Other intervertebral disc displacement, lumbar region: Secondary | ICD-10-CM | POA: Diagnosis not present

## 2014-03-01 DIAGNOSIS — M5126 Other intervertebral disc displacement, lumbar region: Secondary | ICD-10-CM | POA: Diagnosis not present

## 2014-03-01 DIAGNOSIS — M5124 Other intervertebral disc displacement, thoracic region: Secondary | ICD-10-CM | POA: Diagnosis not present

## 2014-03-01 DIAGNOSIS — M999 Biomechanical lesion, unspecified: Secondary | ICD-10-CM | POA: Diagnosis not present

## 2014-03-01 DIAGNOSIS — IMO0002 Reserved for concepts with insufficient information to code with codable children: Secondary | ICD-10-CM | POA: Diagnosis not present

## 2014-03-03 DIAGNOSIS — M999 Biomechanical lesion, unspecified: Secondary | ICD-10-CM | POA: Diagnosis not present

## 2014-03-03 DIAGNOSIS — IMO0002 Reserved for concepts with insufficient information to code with codable children: Secondary | ICD-10-CM | POA: Diagnosis not present

## 2014-03-03 DIAGNOSIS — M5126 Other intervertebral disc displacement, lumbar region: Secondary | ICD-10-CM | POA: Diagnosis not present

## 2014-03-03 DIAGNOSIS — M5124 Other intervertebral disc displacement, thoracic region: Secondary | ICD-10-CM | POA: Diagnosis not present

## 2014-03-06 DIAGNOSIS — M5124 Other intervertebral disc displacement, thoracic region: Secondary | ICD-10-CM | POA: Diagnosis not present

## 2014-03-06 DIAGNOSIS — M5126 Other intervertebral disc displacement, lumbar region: Secondary | ICD-10-CM | POA: Diagnosis not present

## 2014-03-06 DIAGNOSIS — M999 Biomechanical lesion, unspecified: Secondary | ICD-10-CM | POA: Diagnosis not present

## 2014-03-06 DIAGNOSIS — IMO0002 Reserved for concepts with insufficient information to code with codable children: Secondary | ICD-10-CM | POA: Diagnosis not present

## 2014-03-07 DIAGNOSIS — IMO0002 Reserved for concepts with insufficient information to code with codable children: Secondary | ICD-10-CM | POA: Diagnosis not present

## 2014-03-07 DIAGNOSIS — M5126 Other intervertebral disc displacement, lumbar region: Secondary | ICD-10-CM | POA: Diagnosis not present

## 2014-03-07 DIAGNOSIS — M999 Biomechanical lesion, unspecified: Secondary | ICD-10-CM | POA: Diagnosis not present

## 2014-03-07 DIAGNOSIS — M5124 Other intervertebral disc displacement, thoracic region: Secondary | ICD-10-CM | POA: Diagnosis not present

## 2014-03-09 DIAGNOSIS — M5124 Other intervertebral disc displacement, thoracic region: Secondary | ICD-10-CM | POA: Diagnosis not present

## 2014-03-09 DIAGNOSIS — M5126 Other intervertebral disc displacement, lumbar region: Secondary | ICD-10-CM | POA: Diagnosis not present

## 2014-03-09 DIAGNOSIS — IMO0002 Reserved for concepts with insufficient information to code with codable children: Secondary | ICD-10-CM | POA: Diagnosis not present

## 2014-03-09 DIAGNOSIS — M999 Biomechanical lesion, unspecified: Secondary | ICD-10-CM | POA: Diagnosis not present

## 2014-03-13 ENCOUNTER — Ambulatory Visit (INDEPENDENT_AMBULATORY_CARE_PROVIDER_SITE_OTHER): Payer: Medicare Other | Admitting: Family Medicine

## 2014-03-13 DIAGNOSIS — M5126 Other intervertebral disc displacement, lumbar region: Secondary | ICD-10-CM | POA: Diagnosis not present

## 2014-03-13 DIAGNOSIS — Z23 Encounter for immunization: Secondary | ICD-10-CM

## 2014-03-13 DIAGNOSIS — M5124 Other intervertebral disc displacement, thoracic region: Secondary | ICD-10-CM | POA: Diagnosis not present

## 2014-03-13 DIAGNOSIS — IMO0002 Reserved for concepts with insufficient information to code with codable children: Secondary | ICD-10-CM | POA: Diagnosis not present

## 2014-03-13 DIAGNOSIS — M999 Biomechanical lesion, unspecified: Secondary | ICD-10-CM | POA: Diagnosis not present

## 2014-03-15 DIAGNOSIS — M999 Biomechanical lesion, unspecified: Secondary | ICD-10-CM | POA: Diagnosis not present

## 2014-03-15 DIAGNOSIS — IMO0002 Reserved for concepts with insufficient information to code with codable children: Secondary | ICD-10-CM | POA: Diagnosis not present

## 2014-03-15 DIAGNOSIS — M5124 Other intervertebral disc displacement, thoracic region: Secondary | ICD-10-CM | POA: Diagnosis not present

## 2014-03-15 DIAGNOSIS — M5126 Other intervertebral disc displacement, lumbar region: Secondary | ICD-10-CM | POA: Diagnosis not present

## 2014-03-20 DIAGNOSIS — M5126 Other intervertebral disc displacement, lumbar region: Secondary | ICD-10-CM | POA: Diagnosis not present

## 2014-03-20 DIAGNOSIS — M999 Biomechanical lesion, unspecified: Secondary | ICD-10-CM | POA: Diagnosis not present

## 2014-03-20 DIAGNOSIS — IMO0002 Reserved for concepts with insufficient information to code with codable children: Secondary | ICD-10-CM | POA: Diagnosis not present

## 2014-03-20 DIAGNOSIS — M5124 Other intervertebral disc displacement, thoracic region: Secondary | ICD-10-CM | POA: Diagnosis not present

## 2014-03-22 DIAGNOSIS — M5124 Other intervertebral disc displacement, thoracic region: Secondary | ICD-10-CM | POA: Diagnosis not present

## 2014-03-22 DIAGNOSIS — M999 Biomechanical lesion, unspecified: Secondary | ICD-10-CM | POA: Diagnosis not present

## 2014-03-22 DIAGNOSIS — IMO0002 Reserved for concepts with insufficient information to code with codable children: Secondary | ICD-10-CM | POA: Diagnosis not present

## 2014-03-22 DIAGNOSIS — M5126 Other intervertebral disc displacement, lumbar region: Secondary | ICD-10-CM | POA: Diagnosis not present

## 2014-03-27 DIAGNOSIS — M5125 Other intervertebral disc displacement, thoracolumbar region: Secondary | ICD-10-CM | POA: Diagnosis not present

## 2014-03-27 DIAGNOSIS — M5127 Other intervertebral disc displacement, lumbosacral region: Secondary | ICD-10-CM | POA: Diagnosis not present

## 2014-03-27 DIAGNOSIS — M5417 Radiculopathy, lumbosacral region: Secondary | ICD-10-CM | POA: Diagnosis not present

## 2014-03-27 DIAGNOSIS — M9902 Segmental and somatic dysfunction of thoracic region: Secondary | ICD-10-CM | POA: Diagnosis not present

## 2014-03-27 DIAGNOSIS — M9903 Segmental and somatic dysfunction of lumbar region: Secondary | ICD-10-CM | POA: Diagnosis not present

## 2014-04-07 ENCOUNTER — Other Ambulatory Visit: Payer: Self-pay

## 2014-04-17 ENCOUNTER — Encounter: Payer: Self-pay | Admitting: Family Medicine

## 2014-04-17 ENCOUNTER — Ambulatory Visit (INDEPENDENT_AMBULATORY_CARE_PROVIDER_SITE_OTHER): Payer: Medicare Other | Admitting: Family Medicine

## 2014-04-17 VITALS — BP 110/80 | Temp 97.5°F | Wt 190.0 lb

## 2014-04-17 DIAGNOSIS — E7800 Pure hypercholesterolemia, unspecified: Secondary | ICD-10-CM

## 2014-04-17 DIAGNOSIS — I10 Essential (primary) hypertension: Secondary | ICD-10-CM

## 2014-04-17 DIAGNOSIS — D518 Other vitamin B12 deficiency anemias: Secondary | ICD-10-CM

## 2014-04-17 DIAGNOSIS — D7589 Other specified diseases of blood and blood-forming organs: Secondary | ICD-10-CM

## 2014-04-17 DIAGNOSIS — E038 Other specified hypothyroidism: Secondary | ICD-10-CM | POA: Diagnosis not present

## 2014-04-17 DIAGNOSIS — F411 Generalized anxiety disorder: Secondary | ICD-10-CM | POA: Insufficient documentation

## 2014-04-17 DIAGNOSIS — E034 Atrophy of thyroid (acquired): Secondary | ICD-10-CM

## 2014-04-17 MED ORDER — ROSUVASTATIN CALCIUM 5 MG PO TABS: 5.0000 mg | ORAL_TABLET | Freq: Every day | ORAL | Status: DC

## 2014-04-17 NOTE — Assessment & Plan Note (Signed)
Continue levothyroxine 50 mcg as well controlled previously. Check TSH along with future fasting labs

## 2014-04-17 NOTE — Assessment & Plan Note (Signed)
Continue carvedilol and lisinopril as well controlled

## 2014-04-17 NOTE — Assessment & Plan Note (Signed)
Well controlled previously on Crestor 5 mg. Continue but check direct LDL today

## 2014-04-17 NOTE — Progress Notes (Signed)
Zachary Reddish, MD Phone: [phone]  Subjective:  Patient presents today to establish care with me as their new primary care provider. Patient was formerly a patient of Dr. Arnoldo Morale. Chief complaint-noted.   Hypothyroidism-well controlled On thyroid medication-levothyroxine 76mcg ROS-No hair or nail changes. No heat/cold intolerance. No constipation or diarrhea (since last visit)  Hyperlipidemia-well controlled  Lab Results  Component Value Date   LDLCALC 53 07/29/2013   On statin: crestor 5mg  Regular exercise: yes, just got done with gym today-bikes/elliptical ROS- no chest pain or shortness of breath. No myalgias  Hypertension-well controlled on carvedilol and lisinopril  BP Readings from Last 3 Encounters:  04/17/14 110/80  02/16/14 130/62  01/23/14 120/78  Compliant with medications-yes without side effects ROS-Denies any CP, HA, SOB, blurry vision, LE edema.   The following were reviewed and entered/updated in epic: Past Medical History  Diagnosis Date  . CAD (coronary artery disease)   . Acute myocardial infarction of inferoposterior wall, subsequent episode of care   . Hypertension   . Hypercholesterolemia   . PVD (peripheral vascular disease)   . Personal history of thrombophlebitis   . Encounter for long-term (current) use of other medications   . Respiratory failure   . GERD (gastroesophageal reflux disease)   . Hypertrophy of prostate with urinary obstruction and other lower urinary tract symptoms (LUTS)   . Unspecified adverse effect of unspecified drug, medicinal and biological substance   . Unspecified hypothyroidism   . Family history of malignant neoplasm of gastrointestinal tract    Patient Active Problem List   Diagnosis Date Noted  . CAD w/ hx MI s/p CABG 09/07/2012    Priority: High  . Cardiomyopathy, ischemic 03/22/2010    Priority: High  . Anxiety state 04/17/2014    Priority: Medium  . HYPERCHOLESTEROLEMIA 10/31/2008    Priority: Medium    . Hypothyroidism 03/26/2007    Priority: Medium  . Essential hypertension 03/22/2007    Priority: Medium  . ANEMIA, VITAMIN B12 DEFICIENCY 04/22/2010    Priority: Low  . Actinic keratosis 06/25/2009    Priority: Low  . BASAL CELL CARCINOMA, NOSE 02/05/2009    Priority: Low  . Localized osteoarthrosis, lower leg 12/06/2008    Priority: Low  . BPH (benign prostatic hyperplasia) 09/28/2007    Priority: Low  . GERD 03/22/2007    Priority: Low   Past Surgical History  Procedure Laterality Date  . Tonsillectomy    . Knee surgery    . Coronary artery bypass graft      x 5 on August 29, 2008  . Tachecostomy placement      after heart attack  . Cholecystectomy  02/26/2012    Procedure: LAPAROSCOPIC CHOLECYSTECTOMY WITH INTRAOPERATIVE CHOLANGIOGRAM;  Surgeon: Madilyn Hook, DO;  Location: [facility];  Service: General;  Laterality: N/A;    Family History  Problem Relation Age of Onset  . Heart disease Mother   . Tuberculosis Father     Medications- reviewed and updated Current Outpatient Prescriptions  Medication Sig Dispense Refill  . aspirin 81 MG tablet Take 81 mg by mouth daily.        . B Complex-C-Folic Acid (FOLBEE PLUS) TABS TAKE 1 TABLET EVERY DAY  30 tablet  2  . carvedilol (COREG) 3.125 MG tablet TAKE 1 TABLET TWICE A DAY WITH FOOD  180 tablet  3  . diazepam (VALIUM) 5 MG tablet Take 5 mg by mouth every 8 (eight) hours as needed. For anxiety      . levothyroxine (  SYNTHROID, LEVOTHROID) 50 MCG tablet TAKE 1 TABLET EVERY DAY  30 tablet  11  . lisinopril (PRINIVIL,ZESTRIL) 5 MG tablet Take 1 tablet (5 mg total) by mouth daily.  90 tablet  3  . ranitidine (ZANTAC) 300 MG tablet TAKE 1 TABLET (300 MG TOTAL) BY MOUTH AT BEDTIME.  30 tablet  6  . rosuvastatin (CRESTOR) 5 MG tablet Take 1 tablet (5 mg total) by mouth daily at 6 PM.  30 tablet  11  . tamsulosin (FLOMAX) 0.4 MG CAPS capsule TAKE ONE CAPSULE BY MOUTH AT BEDTIME  30 capsule  5  . nitroGLYCERIN (NITROSTAT) 0.4 MG SL tablet  Place 1 tablet (0.4 mg total) under the tongue every 5 (five) minutes as needed for chest pain.  25 tablet  6   No current facility-administered medications for this visit.    Allergies-reviewed and updated Allergies  Allergen Reactions  . Losartan Potassium   . Naproxen   . Penicillins     History   Social History  . Marital Status: Married    Spouse Name: N/A    Number of Children: N/A  . Years of Education: N/A   Social History Main Topics  . Smoking status: Never Smoker   . Smokeless tobacco: None  . Alcohol Use: No  . Drug Use: No  . Sexual Activity: None   Other Topics Concern  . None   Social History Narrative   Married 1958. 2 kids (boy, girl). 2 grandkids (boy and girl).       Retired from [employer] ([employer] in [place])      [place]: former Air cabin crew, tv, reading    ROS--See HPI   Objective: BP 110/80  Temp(Src) 97.5 F (36.4 C)  Wt 190 lb (86.183 kg) Gen: NAD, resting comfortably Neck: no thyromegaly CV: RRR no murmurs rubs or gallops Lungs: CTAB no crackles, wheeze, rhonchi Abdomen: soft/nontender/nondistended/normal bowel sounds.  MSK: kyphotic spine Ext: trace edema Skin: warm, dry, no rash Neuro: grossly normal, moves all extremities, PERRLA   Assessment/Plan:  Hypothyroidism Continue levothyroxine 50 mcg as well controlled previously. Check TSH along with future fasting labs  HYPERCHOLESTEROLEMIA Well controlled previously on Crestor 5 mg. Continue but check direct LDL today  Essential hypertension Continue carvedilol and lisinopril as well controlled   Future fasting labs, also check b12 and folate due to macrocytosis previously noted Orders Placed This Encounter  Procedures  . CBC    [place]    Standing Status: Future     Number of Occurrences:      Standing Expiration Date: 11/16/2014  . Comprehensive metabolic panel    [place]    Standing Status: Future     Number of Occurrences:      Standing Expiration Date:  11/16/2014  . TSH    [place]    Standing Status: Future     Number of Occurrences:      Standing Expiration Date: 11/16/2014  . LDL cholesterol, direct    [place]    Standing Status: Future     Number of Occurrences:      Standing Expiration Date: 11/16/2014  . Vitamin B12    Standing Status: Future     Number of Occurrences:      Standing Expiration Date: 11/16/2014  . Folate RBC    Standing Status: Future     Number of Occurrences:      Standing Expiration Date: 11/16/2014    Meds ordered this encounter  Medications  . rosuvastatin (CRESTOR) 5 MG  tablet    Sig: Take 1 tablet (5 mg total) by mouth daily at 6 PM.    Dispense:  30 tablet    Refill:  11

## 2014-04-17 NOTE — Patient Instructions (Addendum)
Great to get to know you better today!   Come back for fasting labs within next few weeks. See you in 6 months.   No changes today.

## 2014-04-18 ENCOUNTER — Other Ambulatory Visit (INDEPENDENT_AMBULATORY_CARE_PROVIDER_SITE_OTHER): Payer: Medicare Other

## 2014-04-18 DIAGNOSIS — D7589 Other specified diseases of blood and blood-forming organs: Secondary | ICD-10-CM | POA: Diagnosis not present

## 2014-04-18 DIAGNOSIS — D518 Other vitamin B12 deficiency anemias: Secondary | ICD-10-CM | POA: Diagnosis not present

## 2014-04-18 DIAGNOSIS — I10 Essential (primary) hypertension: Secondary | ICD-10-CM | POA: Diagnosis not present

## 2014-04-18 DIAGNOSIS — E7800 Pure hypercholesterolemia, unspecified: Secondary | ICD-10-CM

## 2014-04-18 LAB — CBC
HCT: 41.2 % (ref 39.0–52.0)
Hemoglobin: 13.8 g/dL (ref 13.0–17.0)
MCHC: 33.5 g/dL (ref 30.0–36.0)
MCV: 99.7 fl (ref 78.0–100.0)
PLATELETS: 241 10*3/uL (ref 150.0–400.0)
RBC: 4.13 Mil/uL — ABNORMAL LOW (ref 4.22–5.81)
RDW: 12.6 % (ref 11.5–15.5)
WBC: 8.4 10*3/uL (ref 4.0–10.5)

## 2014-04-18 LAB — COMPREHENSIVE METABOLIC PANEL
ALT: 27 U/L (ref 0–53)
AST: 29 U/L (ref 0–37)
Albumin: 3.4 g/dL — ABNORMAL LOW (ref 3.5–5.2)
Alkaline Phosphatase: 50 U/L (ref 39–117)
BILIRUBIN TOTAL: 0.6 mg/dL (ref 0.2–1.2)
BUN: 22 mg/dL (ref 6–23)
CO2: 23 mEq/L (ref 19–32)
CREATININE: 1.3 mg/dL (ref 0.4–1.5)
Calcium: 8.8 mg/dL (ref 8.4–10.5)
Chloride: 105 mEq/L (ref 96–112)
GFR: 58.7 mL/min — ABNORMAL LOW (ref >60.00)
Glucose, Bld: 90 mg/dL (ref 70–99)
Potassium: 4.6 mEq/L (ref 3.5–5.1)
Sodium: 137 mEq/L (ref 135–145)
Total Protein: 6.8 g/dL (ref 6.0–8.3)

## 2014-04-18 LAB — TSH: TSH: 1.8 u[IU]/mL (ref 0.35–4.50)

## 2014-04-18 LAB — LDL CHOLESTEROL, DIRECT: Direct LDL: 56.3 mg/dL

## 2014-04-18 LAB — VITAMIN B12: VITAMIN B 12: 1192 pg/mL — AB (ref 211–911)

## 2014-04-19 LAB — FOLATE RBC: RBC FOLATE: 896 ng/mL (ref >280)

## 2014-04-20 ENCOUNTER — Encounter: Payer: Self-pay | Admitting: Family Medicine

## 2014-04-20 ENCOUNTER — Ambulatory Visit: Payer: Medicare Other | Admitting: Family Medicine

## 2014-04-20 DIAGNOSIS — N183 Chronic kidney disease, stage 3 unspecified: Secondary | ICD-10-CM | POA: Insufficient documentation

## 2014-05-03 ENCOUNTER — Other Ambulatory Visit: Payer: Self-pay | Admitting: Cardiovascular Disease

## 2014-05-03 ENCOUNTER — Other Ambulatory Visit: Payer: Self-pay

## 2014-05-03 DIAGNOSIS — I255 Ischemic cardiomyopathy: Secondary | ICD-10-CM

## 2014-05-03 MED ORDER — LISINOPRIL 5 MG PO TABS: 5.0000 mg | ORAL_TABLET | Freq: Every day | ORAL | Status: DC

## 2014-05-08 ENCOUNTER — Telehealth: Payer: Self-pay | Admitting: Family Medicine

## 2014-05-08 MED ORDER — TAMSULOSIN HCL 0.4 MG PO CAPS: 0.4000 mg | ORAL_CAPSULE | Freq: Every day | ORAL | Status: DC

## 2014-05-08 NOTE — Telephone Encounter (Signed)
Medication refilled

## 2014-05-08 NOTE — Telephone Encounter (Signed)
[id number] [place], [place] is requesting re-fill on tamsulosin (FLOMAX) 0.4 MG CAPS capsule

## 2014-05-16 ENCOUNTER — Telehealth: Payer: Self-pay | Admitting: Family Medicine

## 2014-05-16 MED ORDER — FOLBEE PLUS PO TABS: 1.0000 | ORAL_TABLET | Freq: Every day | ORAL | Status: DC

## 2014-05-16 NOTE — Telephone Encounter (Signed)
Medication refilled

## 2014-05-16 NOTE — Telephone Encounter (Signed)
[id number] - [place], [place] is requesting re-fill on B Complex-C-Folic Acid (FOLBEE PLUS) TABS

## 2014-08-13 ENCOUNTER — Other Ambulatory Visit: Payer: Self-pay | Admitting: Family Medicine

## 2014-08-23 ENCOUNTER — Telehealth: Payer: Self-pay

## 2014-08-23 MED ORDER — RANITIDINE HCL 300 MG PO TABS: ORAL_TABLET | ORAL | Status: DC

## 2014-08-23 NOTE — Telephone Encounter (Signed)
Rx sent to pharmacy   

## 2014-08-23 NOTE — Telephone Encounter (Signed)
[facility] request for RANITIDINE 300MG  TAB

## 2014-10-17 ENCOUNTER — Encounter: Payer: Self-pay | Admitting: Family Medicine

## 2014-10-17 ENCOUNTER — Ambulatory Visit (INDEPENDENT_AMBULATORY_CARE_PROVIDER_SITE_OTHER): Payer: Medicare Other | Admitting: Family Medicine

## 2014-10-17 VITALS — BP 128/78 | HR 54 | Temp 97.9°F | Wt 194.0 lb

## 2014-10-17 DIAGNOSIS — E7800 Pure hypercholesterolemia, unspecified: Secondary | ICD-10-CM

## 2014-10-17 DIAGNOSIS — N183 Chronic kidney disease, stage 3 unspecified: Secondary | ICD-10-CM

## 2014-10-17 DIAGNOSIS — F411 Generalized anxiety disorder: Secondary | ICD-10-CM | POA: Diagnosis not present

## 2014-10-17 DIAGNOSIS — I10 Essential (primary) hypertension: Secondary | ICD-10-CM

## 2014-10-17 DIAGNOSIS — Z23 Encounter for immunization: Secondary | ICD-10-CM

## 2014-10-17 NOTE — Assessment & Plan Note (Signed)
S: tolerating crestor without muscle aches.  A/P: LDL 56 at last visit, discussed repeat labs at follow up in 6 months.

## 2014-10-17 NOTE — Assessment & Plan Note (Signed)
S: controlled on Carvedilol 3.125 BID, lisinopril 5mg . Obviously some bradycardic but asymptomatic from this A/P: continue current rx.

## 2014-10-17 NOTE — Progress Notes (Signed)
Garret Reddish, MD Phone: [phone]  Subjective:   Zachary Decker is a 81 y.o. year old very pleasant male patient who presents with the following:  See problem oriented charting- ROS- no chest pain, shortness of breath. No decreased urination or changes in urination.   Past Medical History- Patient Active Problem List   Diagnosis Date Noted  . CAD w/ hx MI s/p CABG 09/07/2012    Priority: High  . Cardiomyopathy, ischemic 03/22/2010    Priority: High  . CKD (chronic kidney disease), stage III 04/20/2014    Priority: Medium  . Anxiety state 04/17/2014    Priority: Medium  . HYPERCHOLESTEROLEMIA 10/31/2008    Priority: Medium  . BPH (benign prostatic hyperplasia) 09/28/2007    Priority: Medium  . Hypothyroidism 03/26/2007    Priority: Medium  . Essential hypertension 03/22/2007    Priority: Medium  . ANEMIA, VITAMIN B12 DEFICIENCY 04/22/2010    Priority: Low  . Actinic keratosis 06/25/2009    Priority: Low  . BASAL CELL CARCINOMA, NOSE 02/05/2009    Priority: Low  . Localized osteoarthrosis, lower leg 12/06/2008    Priority: Low  . GERD 03/22/2007    Priority: Low   Medications- reviewed and updated Current Outpatient Prescriptions  Medication Sig Dispense Refill  . aspirin 81 MG tablet Take 81 mg by mouth daily.      . B Complex-C-Folic Acid (FOLBEE PLUS) TABS TAKE 1 TABLET BY MOUTH DAILY. 30 tablet 2  . carvedilol (COREG) 3.125 MG tablet TAKE 1 TABLET TWICE A DAY WITH FOOD 180 tablet 3  . diazepam (VALIUM) 5 MG tablet Take 5 mg by mouth every 8 (eight) hours as needed. For anxiety    . levothyroxine (SYNTHROID, LEVOTHROID) 50 MCG tablet TAKE 1 TABLET EVERY DAY 30 tablet 11  . lisinopril (PRINIVIL,ZESTRIL) 5 MG tablet Take 1 tablet (5 mg total) by mouth daily. 90 tablet 3  . nitroGLYCERIN (NITROSTAT) 0.4 MG SL tablet Place 1 tablet (0.4 mg total) under the tongue every 5 (five) minutes as needed for chest pain. 25 tablet 6  . ranitidine (ZANTAC) 300 MG tablet  TAKE 1 TABLET (300 MG TOTAL) BY MOUTH AT BEDTIME. 30 tablet 6  . rosuvastatin (CRESTOR) 5 MG tablet Take 1 tablet (5 mg total) by mouth daily at 6 PM. 30 tablet 11  . tamsulosin (FLOMAX) 0.4 MG CAPS capsule Take 1 capsule (0.4 mg total) by mouth daily. 30 capsule 5   Objective: BP 128/78 mmHg  Pulse 54  Temp(Src) 97.9 F (36.6 C)  Wt 194 lb (87.998 kg) Gen: NAD, resting comfortably CV: regular rate, slightly bradycardic no murmurs rubs or gallops Lungs: CTAB no crackles, wheeze, rhonchi Abdomen: soft/nontender/nondistended/normal bowel sounds. No rebound or guarding.  Ext: no edema Skin: warm, dry Neuro: grossly normal, moves all extremities Kyphotic posture, good motion right shoulder    Assessment/Plan:  HYPERCHOLESTEROLEMIA S: tolerating crestor without muscle aches.  A/P: LDL 56 at last visit, discussed repeat labs at follow up in 6 months.     Essential hypertension S: controlled on Carvedilol 3.125 BID, lisinopril 5mg . Obviously some bradycardic but asymptomatic from this A/P: continue current rx.     CKD (chronic kidney disease), stage III S: GFR stable last visit in upper 10s.  A/P: continue BP and cholesterol control. Check bmet at next visit q 6-12 months as long as stable.     Anxiety state 20 Valium used over 2 years prior to March 2016. Sometimes uses if having R shoulder pain to help  him sleep since that time. In daytime, can relieve it with ice or heat. He is using a half tab and I agreed he could use this sparingly for sleep but to make sure no increasing, regular use. .    6 months.   Orders Placed This Encounter  Procedures  . Pneumococcal conjugate vaccine 13-valent

## 2014-10-17 NOTE — Progress Notes (Signed)
Pre visit review using our clinic review tool, if applicable. No additional management support is needed unless otherwise documented below in the visit note. 

## 2014-10-17 NOTE — Assessment & Plan Note (Signed)
S: GFR stable last visit in upper 3s.  A/P: continue BP and cholesterol control. Check bmet at next visit q 6-12 months as long as stable.

## 2014-10-17 NOTE — Assessment & Plan Note (Signed)
20 Valium used over 2 years prior to March 2016. Sometimes uses if having R shoulder pain to help him sleep since that time. In daytime, can relieve it with ice or heat. He is using a half tab and I agreed he could use this sparingly for sleep but to make sure no increasing, regular use. Marland Kitchen

## 2014-10-17 NOTE — Patient Instructions (Addendum)
Things look good today  Come see [place] if you get swelling that doesn't get better in a few days or that worsens. Also see [place] or cardiology if short of breath or any chest pain.   Ok to use 1/2 a valium on those nights where your shoulder hurts you and you can't sleep but use sparingly.   Let's recheck your bloodwork in 6 months. See me sometime after October 27th.   You received pneumonia shot (final one today)

## 2014-11-09 ENCOUNTER — Other Ambulatory Visit: Payer: Self-pay | Admitting: Family Medicine

## 2014-11-12 ENCOUNTER — Other Ambulatory Visit: Payer: Self-pay | Admitting: Family Medicine

## 2015-01-05 ENCOUNTER — Other Ambulatory Visit: Payer: Self-pay | Admitting: Family Medicine

## 2015-01-05 DIAGNOSIS — H43813 Vitreous degeneration, bilateral: Secondary | ICD-10-CM | POA: Diagnosis not present

## 2015-01-05 DIAGNOSIS — Z961 Presence of intraocular lens: Secondary | ICD-10-CM | POA: Diagnosis not present

## 2015-01-05 DIAGNOSIS — H02834 Dermatochalasis of left upper eyelid: Secondary | ICD-10-CM | POA: Diagnosis not present

## 2015-01-05 DIAGNOSIS — H02831 Dermatochalasis of right upper eyelid: Secondary | ICD-10-CM | POA: Diagnosis not present

## 2015-01-05 MED ORDER — CARVEDILOL 3.125 MG PO TABS: 3.1250 mg | ORAL_TABLET | Freq: Two times a day (BID) | ORAL | Status: DC

## 2015-01-05 NOTE — Telephone Encounter (Signed)
[id number] - [place], [place] requesting approval to refill of carvedilol (COREG) 3.125 MG tablet #180, last filled 10/01/14.

## 2015-01-05 NOTE — Telephone Encounter (Signed)
Rx sent to pharmacy   

## 2015-01-10 ENCOUNTER — Telehealth: Payer: Self-pay | Admitting: Family Medicine

## 2015-01-10 MED ORDER — LEVOTHYROXINE SODIUM 50 MCG PO TABS: 50.0000 ug | ORAL_TABLET | Freq: Every day | ORAL | Status: DC

## 2015-01-10 NOTE — Telephone Encounter (Signed)
Medication refilled

## 2015-01-10 NOTE — Telephone Encounter (Signed)
[id number] - [place], [place]  Requesting approval to refill levothyroxine (SYNTHROID, LEVOTHROID) 50 MCG tablet #30, last filled 12/11/14

## 2015-01-18 ENCOUNTER — Telehealth: Payer: Self-pay | Admitting: Cardiovascular Disease

## 2015-01-18 NOTE — Telephone Encounter (Signed)
New Message       Pt's wife calling wanting to know if pt needs fasting labs w/ upcoming appt. Please call back and inform them.

## 2015-01-18 NOTE — Telephone Encounter (Signed)
Spoke with pt and told him he did not need lab work prior to appt with Dr. Angelena Form.  Due for lipid and liver in October but these are followed by primary care and he is seeing them in October.

## 2015-02-11 ENCOUNTER — Other Ambulatory Visit: Payer: Self-pay | Admitting: Family Medicine

## 2015-03-21 ENCOUNTER — Ambulatory Visit (INDEPENDENT_AMBULATORY_CARE_PROVIDER_SITE_OTHER): Payer: Medicare Other | Admitting: Family Medicine

## 2015-03-21 DIAGNOSIS — Z23 Encounter for immunization: Secondary | ICD-10-CM

## 2015-03-26 ENCOUNTER — Other Ambulatory Visit: Payer: Self-pay | Admitting: Family Medicine

## 2015-03-30 ENCOUNTER — Ambulatory Visit (INDEPENDENT_AMBULATORY_CARE_PROVIDER_SITE_OTHER): Payer: Medicare Other | Admitting: Cardiovascular Disease

## 2015-03-30 ENCOUNTER — Encounter: Payer: Self-pay | Admitting: Cardiovascular Disease

## 2015-03-30 VITALS — BP 130/62 | HR 50 | Ht 74.0 in | Wt 192.4 lb

## 2015-03-30 DIAGNOSIS — I10 Essential (primary) hypertension: Secondary | ICD-10-CM | POA: Diagnosis not present

## 2015-03-30 DIAGNOSIS — I255 Ischemic cardiomyopathy: Secondary | ICD-10-CM | POA: Diagnosis not present

## 2015-03-30 DIAGNOSIS — I2510 Atherosclerotic heart disease of native coronary artery without angina pectoris: Secondary | ICD-10-CM | POA: Diagnosis not present

## 2015-03-30 DIAGNOSIS — E7800 Pure hypercholesterolemia, unspecified: Secondary | ICD-10-CM | POA: Diagnosis not present

## 2015-03-30 NOTE — Progress Notes (Signed)
Chief Complaint  Patient presents with  . Bleeding/Bruising     History of Present Illness: 81 yo male with history of HTN, HLD, CAD s/p 5V CABG March 2010, ischemic cardiomyopathy, GERD, BPH here today for follow up. He has been followed in the past by Dr. Lia Foyer. Admitted March 2010 with inferior MI complicated by VF arrest multiple times and unsuccessful attempt at PCI in the setting of severe multi-vessel CAD. He then had an urgent 5 V CABG. He is known to have a cardiomyopathy with last echo 2013 showing LVEF 35-40%. Stress myoview 03/08/13 with scar but no ischemia, LVEF=46%.  He is here today for follow up. He has been feeling well. No chest pain or SOB. He continues to exercise three days per week. He has been using the elliptical and bike , weights.  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   Primary Care Physician: Garret Reddish   Past Medical History  Diagnosis Date  . CAD (coronary artery disease)   . Acute myocardial infarction of inferoposterior wall, subsequent episode of care ([place])   . Hypertension   . Hypercholesterolemia   . PVD (peripheral vascular disease) ([place])   . Personal history of thrombophlebitis   . Encounter for long-term (current) use of other medications   . Respiratory failure ([place])   . GERD (gastroesophageal reflux disease)   . Hypertrophy of prostate with urinary obstruction and other lower urinary tract symptoms (LUTS)   . Unspecified adverse effect of unspecified drug, medicinal and biological substance   . Unspecified hypothyroidism   . Family history of malignant neoplasm of gastrointestinal tract     Past Surgical History  Procedure Laterality Date  . Tonsillectomy    . Knee surgery    . Coronary artery bypass graft      x 5 on August 29, 2008  . Tachecostomy placement      after heart attack  . Cholecystectomy  02/26/2012    Procedure: LAPAROSCOPIC CHOLECYSTECTOMY WITH INTRAOPERATIVE CHOLANGIOGRAM;  Surgeon: Madilyn Hook,  DO;  Location: [facility];  Service: General;  Laterality: N/A;    Current Outpatient Prescriptions  Medication Sig Dispense Refill  . aspirin 81 MG tablet Take 81 mg by mouth daily.      . B Complex-C-Folic Acid (FOLBEE PLUS) TABS TAKE 1 TABLET BY MOUTH DAILY. 30 tablet 6  . carvedilol (COREG) 3.125 MG tablet Take 1 tablet (3.125 mg total) by mouth 2 (two) times daily with a meal. 180 tablet 1  . diazepam (VALIUM) 5 MG tablet Take 5 mg by mouth every 8 (eight) hours as needed. For anxiety    . levothyroxine (SYNTHROID, LEVOTHROID) 50 MCG tablet Take 1 tablet (50 mcg total) by mouth daily. 30 tablet 11  . lisinopril (PRINIVIL,ZESTRIL) 5 MG tablet Take 1 tablet (5 mg total) by mouth daily. 90 tablet 3  . nitroGLYCERIN (NITROSTAT) 0.4 MG SL tablet Place 1 tablet (0.4 mg total) under the tongue every 5 (five) minutes as needed for chest pain. 25 tablet 6  . ranitidine (ZANTAC) 300 MG tablet TAKE 1 TABLET (300 MG TOTAL) BY MOUTH AT BEDTIME. 30 tablet 6  . rosuvastatin (CRESTOR) 5 MG tablet Take 1 tablet (5 mg total) by mouth daily at 6 PM. 30 tablet 11  . tamsulosin (FLOMAX) 0.4 MG CAPS capsule TAKE 1 CAPSULE (0.4 MG TOTAL) BY MOUTH DAILY. 30 capsule 5   No current facility-administered medications for this visit.    Allergies  Allergen Reactions  . Losartan  Potassium Other (See Comments)    Not known  . Naproxen Nausea And Vomiting  . Penicillins Rash    Social History   Social History  . Marital Status: Married    Spouse Name: N/A  . Number of Children: N/A  . Years of Education: N/A   Occupational History  . Not on file.   Social History Main Topics  . Smoking status: Never Smoker   . Smokeless tobacco: Not on file  . Alcohol Use: No  . Drug Use: No  . Sexual Activity: Not on file   Other Topics Concern  . Not on file   Social History Narrative   Married 1958. 2 kids (boy, girl). 2 grandkids (boy and girl).       Retired from [employer] ([employer] in [place])       Hobbies: former Air cabin crew, tv, reading    Family History  Problem Relation Age of Onset  . Heart disease Mother   . Tuberculosis Father     Review of Systems:  As stated in the HPI and otherwise negative.   BP 130/62 mmHg  Pulse 50  Ht 6\' 2"  (1.88 m)  Wt 192 lb 6.4 oz (87.272 kg)  BMI 24.69 kg/m2  SpO2 98%  Physical Examination: General: Well developed, well nourished, NAD HEENT: OP clear, mucus membranes moist SKIN: warm, dry. No rashes. Neuro: No focal deficits Musculoskeletal: Muscle strength 5/5 all ext Psychiatric: Mood and affect normal Neck: No JVD, no carotid bruits, no thyromegaly, no lymphadenopathy. Lungs:Clear bilaterally, no wheezes, rhonci, crackles Cardiovascular: Regular rate and rhythm. No murmurs, gallops or rubs. Abdomen:Soft. Bowel sounds present. Non-tender.  Extremities: No lower extremity edema. Pulses are 2 + in the bilateral DP/PT.  Echo 09/07/12:  Left ventricle: The cavity size was normal. Wall thickness was normal. Systolic function was moderately reduced. The estimated ejection fraction was in the range of 35% to 40%. There is akinesis of the inferoposterior myocardium. Doppler parameters are consistent with abnormal left ventricular relaxation (grade 1 diastolic dysfunction). - Mitral valve: Mild regurgitation. - Left atrium: The atrium was mildly dilated.  Stress myoview 03/08/13: Stress Procedure: The patient received IV Lexiscan 0.4 mg over 15-seconds. Technetium 52m Sestamibi injected at 30-seconds. This patient had sob and weak the Lexiscan injection. Quantitative spect images were obtained after a 45 minute delay.  Stress ECG: No significant change from baseline ECG  QPS  Raw Data Images: Normal; no motion artifact; normal heart/lung ratio.  Stress Images: There is decreased uptake in the inferior wall and lateral wall  Rest Images: There is decreased uptake in the inferior wall and lateral wall.  Subtraction (SDS): There is a fixed  defect that is most consistent with a previous infarction.  Transient Ischemic Dilatation (Normal <1.22): NA  Lung/Heart Ratio (Normal <0.45): 0.49  Quantitative Gated Spect Images  QGS EDV: 141 ml  QGS ESV: 76 ml  Impression  Exercise Capacity: Lexiscan with no exercise.  BP Response: Hypotensive blood pressure response.  Clinical Symptoms: There is dyspnea.  ECG Impression: No significant ST segment change suggestive of ischemia.  Comparison with Prior Nuclear Study: No images to compare  Overall Impression: Intermediate risk stress nuclear study. There is a large fixed perfusion defect involving the apex, inferior wall and lateral wall consistent with old MI. There is no significant reversible ischemia. There is moderate LV systolic dysfunction with EF 46%.  LV Ejection Fraction: 46%. LV Wall Motion: Inferior wall severe hypokinesis.  EKG:  EKG is ordered today.  The ekg ordered today demonstrates sinus bradycardia 1st degree AV block. Old inferior MI.   Recent Labs: 04/18/2014: ALT 27; BUN 22; Creatinine, Ser 1.3; Hemoglobin 13.8; Platelets 241.0; Potassium 4.6; Sodium 137; TSH 1.80   Lipid Panel    Component Value Date/Time   CHOL 108 07/29/2013 1051   TRIG 66.0 07/29/2013 1051   HDL 41.90 07/29/2013 1051   CHOLHDL 3 07/29/2013 1051   VLDL 13.2 07/29/2013 1051   LDLCALC 53 07/29/2013 1051   LDLDIRECT 56.3 04/18/2014 0902     Wt Readings from Last 3 Encounters:  03/30/15 192 lb 6.4 oz (87.272 kg)  10/17/14 194 lb (87.998 kg)  04/17/14 190 lb (86.183 kg)     Other studies Reviewed: Additional studies/ records that were reviewed today include: . Review of the above records demonstrates:    Assessment and Plan:   1. CAD: Stable. Lexiscan stress myoview without ischemia 03/08/13. Continue beta blocker, ASA, statin, Ace-inh.   2. Ischemic Cardiomyopathy:  LVEF=46% by stress myoview 2014. Continue medical therapy. He has no evidence of CHF.   3. HTN:  Controlled. No  changes today. Will need BMET with labs in 2 weeks.   4. Hyperlipidemia: He is on a statin. Lipids are well controlled. Will need updating. He has plans for labs in 2 weeks in primary care.   Current medicines are reviewed at length with the patient today.  The patient does not have concerns regarding medicines.  The following changes have been made:  no change  Labs/ tests ordered today include:  No orders of the defined types were placed in this encounter.    Disposition:   FU with me in 6 months  Signed, Lauree Chandler, MD 03/30/2015 9:15 AM    [address], [place], [place]  [address] Phone: [phone]; Fax: [phone]

## 2015-03-30 NOTE — Patient Instructions (Signed)

## 2015-04-19 ENCOUNTER — Encounter: Payer: Self-pay | Admitting: Family Medicine

## 2015-04-19 ENCOUNTER — Ambulatory Visit (INDEPENDENT_AMBULATORY_CARE_PROVIDER_SITE_OTHER): Payer: Medicare Other | Admitting: Family Medicine

## 2015-04-19 VITALS — BP 110/72 | HR 56 | Temp 97.7°F | Wt 193.0 lb

## 2015-04-19 DIAGNOSIS — M545 Low back pain, unspecified: Secondary | ICD-10-CM | POA: Insufficient documentation

## 2015-04-19 DIAGNOSIS — I10 Essential (primary) hypertension: Secondary | ICD-10-CM | POA: Diagnosis not present

## 2015-04-19 DIAGNOSIS — E034 Atrophy of thyroid (acquired): Secondary | ICD-10-CM | POA: Diagnosis not present

## 2015-04-19 DIAGNOSIS — F411 Generalized anxiety disorder: Secondary | ICD-10-CM

## 2015-04-19 DIAGNOSIS — E038 Other specified hypothyroidism: Secondary | ICD-10-CM | POA: Diagnosis not present

## 2015-04-19 DIAGNOSIS — E7800 Pure hypercholesterolemia, unspecified: Secondary | ICD-10-CM | POA: Diagnosis not present

## 2015-04-19 LAB — COMPREHENSIVE METABOLIC PANEL
ALT: 23 U/L (ref 0–53)
AST: 29 U/L (ref 0–37)
Albumin: 3.9 g/dL (ref 3.5–5.2)
Alkaline Phosphatase: 51 U/L (ref 39–117)
BILIRUBIN TOTAL: 0.7 mg/dL (ref 0.2–1.2)
BUN: 26 mg/dL — AB (ref 6–23)
CO2: 28 mEq/L (ref 19–32)
Calcium: 9.4 mg/dL (ref 8.4–10.5)
Chloride: 105 mEq/L (ref 96–112)
Creatinine, Ser: 1.26 mg/dL (ref 0.40–1.50)
GFR: 58.55 mL/min — ABNORMAL LOW (ref >60.00)
GLUCOSE: 101 mg/dL — AB (ref 70–99)
POTASSIUM: 5.5 meq/L — AB (ref 3.5–5.1)
SODIUM: 141 meq/L (ref 135–145)
TOTAL PROTEIN: 6.7 g/dL (ref 6.0–8.3)

## 2015-04-19 LAB — LIPID PANEL
CHOL/HDL RATIO: 3
CHOLESTEROL: 116 mg/dL (ref 0–200)
HDL: 40.7 mg/dL (ref >39.00)
LDL Cholesterol: 60 mg/dL (ref 0–99)
NonHDL: 75.26
TRIGLYCERIDES: 74 mg/dL (ref 0.0–149.0)
VLDL: 14.8 mg/dL (ref 0.0–40.0)

## 2015-04-19 LAB — CBC
HCT: 40.6 % (ref 39.0–52.0)
Hemoglobin: 13.7 g/dL (ref 13.0–17.0)
MCHC: 33.8 g/dL (ref 30.0–36.0)
MCV: 99.5 fl (ref 78.0–100.0)
Platelets: 237 10*3/uL (ref 150.0–400.0)
RBC: 4.08 Mil/uL — ABNORMAL LOW (ref 4.22–5.81)
RDW: 12.6 % (ref 11.5–15.5)
WBC: 8.3 10*3/uL (ref 4.0–10.5)

## 2015-04-19 LAB — TSH: TSH: 2.18 u[IU]/mL (ref 0.35–4.50)

## 2015-04-19 MED ORDER — DIAZEPAM 5 MG PO TABS: 5.0000 mg | ORAL_TABLET | Freq: Three times a day (TID) | ORAL | Status: DC | PRN

## 2015-04-19 MED ORDER — ROSUVASTATIN CALCIUM 5 MG PO TABS: 5.0000 mg | ORAL_TABLET | Freq: Every day | ORAL | Status: DC

## 2015-04-19 NOTE — Progress Notes (Signed)
Zachary Reddish, MD  Subjective:  Zachary Decker is a 81 y.o. year old very pleasant male patient who presents for/with See problem oriented charting ROS- no chest pain or shortness of breath. Admits to low back pain.   Past Medical History-  Patient Active Problem List   Diagnosis Date Noted  . CAD w/ hx MI s/p CABG 09/07/2012    Priority: High  . Cardiomyopathy, ischemic 03/22/2010    Priority: High  . CKD (chronic kidney disease), stage III 04/20/2014    Priority: Medium  . Anxiety state 04/17/2014    Priority: Medium  . HYPERCHOLESTEROLEMIA 10/31/2008    Priority: Medium  . BPH (benign prostatic hyperplasia) 09/28/2007    Priority: Medium  . Hypothyroidism 03/26/2007    Priority: Medium  . Essential hypertension 03/22/2007    Priority: Medium  . ANEMIA, VITAMIN B12 DEFICIENCY 04/22/2010    Priority: Low  . Actinic keratosis 06/25/2009    Priority: Low  . BASAL CELL CARCINOMA, NOSE 02/05/2009    Priority: Low  . Localized osteoarthrosis, lower leg 12/06/2008    Priority: Low  . GERD 03/22/2007    Priority: Low  . Low back pain 04/19/2015    Medications- reviewed and updated Current Outpatient Prescriptions  Medication Sig Dispense Refill  . aspirin 81 MG tablet Take 81 mg by mouth daily.      . B Complex-C-Folic Acid (FOLBEE PLUS) TABS TAKE 1 TABLET BY MOUTH DAILY. 30 tablet 6  . carvedilol (COREG) 3.125 MG tablet Take 1 tablet (3.125 mg total) by mouth 2 (two) times daily with a meal. 180 tablet 1  . levothyroxine (SYNTHROID, LEVOTHROID) 50 MCG tablet Take 1 tablet (50 mcg total) by mouth daily. 30 tablet 11  . lisinopril (PRINIVIL,ZESTRIL) 5 MG tablet Take 1 tablet (5 mg total) by mouth daily. 90 tablet 3  . ranitidine (ZANTAC) 300 MG tablet TAKE 1 TABLET (300 MG TOTAL) BY MOUTH AT BEDTIME. 30 tablet 6  . rosuvastatin (CRESTOR) 5 MG tablet Take 1 tablet (5 mg total) by mouth daily at 6 PM. Generic Rosuvastatin ONLY. Crestor not covered in 2017. Plan reports $10 for  90 days. 90 tablet 3  . tamsulosin (FLOMAX) 0.4 MG CAPS capsule TAKE 1 CAPSULE (0.4 MG TOTAL) BY MOUTH DAILY. 30 capsule 5  . diazepam (VALIUM) 5 MG tablet Take 1 tablet (5 mg total) by mouth every 8 (eight) hours as needed. For anxiety 20 tablet 0  . nitroGLYCERIN (NITROSTAT) 0.4 MG SL tablet Place 1 tablet (0.4 mg total) under the tongue every 5 (five) minutes as needed for chest pain. (Patient not taking: Reported on 04/19/2015) 25 tablet 6   No current facility-administered medications for this visit.    Objective: BP 110/72 mmHg  Pulse 56  Temp(Src) 97.7 F (36.5 C)  Wt 193 lb (87.544 kg) Gen: NAD, resting comfortably CV: RRR no murmurs rubs or gallops Lungs: CTAB no crackles, wheeze, rhonchi Abdomen: soft/nontender/nondistended/normal bowel sounds. No rebound or guarding.  Ext: no edema MSK: hunched over posture, pan with palpation low back Skin: warm, dry Neuro: grossly normal, moves all extremities  Assessment/Plan:  Low back pain S: Told inverted lumbar years ago. 2-3 years ago. Chiropractor said they doucl not help patinet. Thought therapy may be beneficial. Up to 7/10 with walking. Sitting or laying is fine.  A/P: refer to [facility] out patient rehab in [place]. [facility] requested.    Hypothyroidism S: controlled. On levothyroxine 18mcg Lab Results  Component Value Date   TSH 1.80  04/18/2014  A/P:Continue current meds and check TSH today to adjust as needed   HYPERCHOLESTEROLEMIA S: crestor 40 bucks a month and has form stating he will have to pay full cost next year. Has been controlled on this but had myalgias and arthralgias on atorvastatin Lab Results  Component Value Date   CHOL 108 07/29/2013   HDL 41.90 07/29/2013   LDLCALC 53 07/29/2013   LDLDIRECT 56.3 04/18/2014   TRIG 66.0 07/29/2013   CHOLHDL 3 07/29/2013  A/P: wrote rx for generic specifically. Looked at website and stated $10 for 90 days. Asked patient to check on this today- if it is not  this cost will ask for prior auth help with Dorna Bloom   Essential hypertension S: controlled. On Carvedilol 3.125 BID, lisinopril 5mg  BP Readings from Last 3 Encounters:  04/19/15 110/72  03/30/15 130/62  10/17/14 128/78  A/P:Continue current meds   Anxiety state S: Valium x1 over last 6 months. Last refill was over 2 years ago, using 20 in about 2 years. Mainly takes around stressful situations with granddaughter A/P: refill provided for sparing use  advised see me at least once a year as long as seeing cardiology every 6 months but also happy to see patient every 6 months. If he goes back to yearly with cards, see me every 6 months  Fasting, also check in on CKD III Orders Placed This Encounter  Procedures  . CBC    [place]  . Comprehensive metabolic panel    [place]    Order Specific Question:  Has the patient fasted?    Answer:  No  . Lipid panel    [place]    Order Specific Question:  Has the patient fasted?    Answer:  No  . TSH    [place]  . Ambulatory referral to Physical Therapy    Referral Priority:  Routine    Referral Type:  Physical Medicine    Referral Reason:  Specialty Services Required    Requested Specialty:  Physical Therapy    Number of Visits Requested:  1    Meds ordered this encounter  Medications  . rosuvastatin (CRESTOR) 5 MG tablet    Sig: Take 1 tablet (5 mg total) by mouth daily at 6 PM. Generic Rosuvastatin ONLY. Crestor not covered in 2017. Plan reports $10 for 90 days.    Dispense:  90 tablet    Refill:  3  . diazepam (VALIUM) 5 MG tablet    Sig: Take 1 tablet (5 mg total) by mouth every 8 (eight) hours as needed. For anxiety    Dispense:  20 tablet    Refill:  0

## 2015-04-19 NOTE — Assessment & Plan Note (Signed)
S: controlled. On Carvedilol 3.125 BID, lisinopril 5mg  BP Readings from Last 3 Encounters:  04/19/15 110/72  03/30/15 130/62  10/17/14 128/78  A/P:Continue current meds

## 2015-04-19 NOTE — Assessment & Plan Note (Signed)
S: Valium x1 over last 6 months. Last refill was over 2 years ago, using 20 in about 2 years. Mainly takes around stressful situations with granddaughter A/P: refill provided for sparing use

## 2015-04-19 NOTE — Assessment & Plan Note (Signed)
S: crestor 40 bucks a month and has form stating he will have to pay full cost next year. Has been controlled on this but had myalgias and arthralgias on atorvastatin Lab Results  Component Value Date   CHOL 108 07/29/2013   HDL 41.90 07/29/2013   LDLCALC 53 07/29/2013   LDLDIRECT 56.3 04/18/2014   TRIG 66.0 07/29/2013   CHOLHDL 3 07/29/2013  A/P: wrote rx for generic specifically. Looked at website and stated $10 for 90 days. Asked patient to check on this today- if it is not this cost will ask for prior auth help with Dorna Bloom

## 2015-04-19 NOTE — Assessment & Plan Note (Signed)
S: controlled. On levothyroxine 70mcg Lab Results  Component Value Date   TSH 1.80 04/18/2014  A/P:Continue current meds and check TSH today to adjust as needed

## 2015-04-19 NOTE — Patient Instructions (Addendum)
We will call you within a week about your referral to PT. If you do not hear within 2 weeks, give [place] a call. If any issues give me a call  Labs before you go  Refilled valium for sparing use  Sees cardiology in 6 months, advised patient he could see me in 1 year but would be happy to see him sooner if anything came up as long as regular cards visits

## 2015-04-19 NOTE — Assessment & Plan Note (Signed)
S: Told inverted lumbar years ago. 2-3 years ago. Chiropractor said they doucl not help patinet. Thought therapy may be beneficial. Up to 7/10 with walking. Sitting or laying is fine.  A/P: refer to [facility] out patient rehab in [place]. [facility] requested.

## 2015-04-20 ENCOUNTER — Other Ambulatory Visit: Payer: Self-pay | Admitting: Family Medicine

## 2015-04-20 DIAGNOSIS — E875 Hyperkalemia: Secondary | ICD-10-CM

## 2015-04-23 ENCOUNTER — Other Ambulatory Visit (INDEPENDENT_AMBULATORY_CARE_PROVIDER_SITE_OTHER): Payer: Medicare Other

## 2015-04-23 DIAGNOSIS — E875 Hyperkalemia: Secondary | ICD-10-CM

## 2015-04-23 LAB — BASIC METABOLIC PANEL
BUN: 30 mg/dL — ABNORMAL HIGH (ref 6–23)
CHLORIDE: 103 meq/L (ref 96–112)
CO2: 26 meq/L (ref 19–32)
CREATININE: 1.4 mg/dL (ref 0.40–1.50)
Calcium: 9.2 mg/dL (ref 8.4–10.5)
GFR: 51.85 mL/min — ABNORMAL LOW (ref >60.00)
Glucose, Bld: 95 mg/dL (ref 70–99)
POTASSIUM: 4.9 meq/L (ref 3.5–5.1)
SODIUM: 139 meq/L (ref 135–145)

## 2015-04-24 ENCOUNTER — Other Ambulatory Visit: Payer: Self-pay | Admitting: Family Medicine

## 2015-04-24 ENCOUNTER — Telehealth: Payer: Self-pay | Admitting: Family Medicine

## 2015-04-24 DIAGNOSIS — N183 Chronic kidney disease, stage 3 unspecified: Secondary | ICD-10-CM

## 2015-04-24 NOTE — Telephone Encounter (Signed)
Pt has sch 6 month blood work. Pt will callback to sch ov in a year

## 2015-04-24 NOTE — Telephone Encounter (Signed)
Pt would like to know if he should just schedule bmp in 6 month or see md also.

## 2015-04-24 NOTE — Telephone Encounter (Signed)
Blood work in 6 months and OV with hunter in a year

## 2015-05-02 ENCOUNTER — Other Ambulatory Visit: Payer: Self-pay | Admitting: Family Medicine

## 2015-05-07 DIAGNOSIS — R2689 Other abnormalities of gait and mobility: Secondary | ICD-10-CM | POA: Diagnosis not present

## 2015-05-07 DIAGNOSIS — M545 Low back pain: Secondary | ICD-10-CM | POA: Diagnosis not present

## 2015-05-25 DIAGNOSIS — M40204 Unspecified kyphosis, thoracic region: Secondary | ICD-10-CM | POA: Diagnosis not present

## 2015-05-25 DIAGNOSIS — R2689 Other abnormalities of gait and mobility: Secondary | ICD-10-CM | POA: Diagnosis not present

## 2015-05-25 DIAGNOSIS — I252 Old myocardial infarction: Secondary | ICD-10-CM | POA: Diagnosis not present

## 2015-05-25 DIAGNOSIS — M545 Low back pain: Secondary | ICD-10-CM | POA: Diagnosis not present

## 2015-05-29 DIAGNOSIS — I252 Old myocardial infarction: Secondary | ICD-10-CM | POA: Diagnosis not present

## 2015-05-29 DIAGNOSIS — M40204 Unspecified kyphosis, thoracic region: Secondary | ICD-10-CM | POA: Diagnosis not present

## 2015-05-29 DIAGNOSIS — R2689 Other abnormalities of gait and mobility: Secondary | ICD-10-CM | POA: Diagnosis not present

## 2015-05-29 DIAGNOSIS — M545 Low back pain: Secondary | ICD-10-CM | POA: Diagnosis not present

## 2015-06-05 DIAGNOSIS — M545 Low back pain: Secondary | ICD-10-CM | POA: Diagnosis not present

## 2015-06-05 DIAGNOSIS — I252 Old myocardial infarction: Secondary | ICD-10-CM | POA: Diagnosis not present

## 2015-06-05 DIAGNOSIS — R2689 Other abnormalities of gait and mobility: Secondary | ICD-10-CM | POA: Diagnosis not present

## 2015-06-05 DIAGNOSIS — M40204 Unspecified kyphosis, thoracic region: Secondary | ICD-10-CM | POA: Diagnosis not present

## 2015-06-12 DIAGNOSIS — M40204 Unspecified kyphosis, thoracic region: Secondary | ICD-10-CM | POA: Diagnosis not present

## 2015-06-12 DIAGNOSIS — M545 Low back pain: Secondary | ICD-10-CM | POA: Diagnosis not present

## 2015-06-12 DIAGNOSIS — I252 Old myocardial infarction: Secondary | ICD-10-CM | POA: Diagnosis not present

## 2015-06-12 DIAGNOSIS — R2689 Other abnormalities of gait and mobility: Secondary | ICD-10-CM | POA: Diagnosis not present

## 2015-06-19 DIAGNOSIS — R2689 Other abnormalities of gait and mobility: Secondary | ICD-10-CM | POA: Diagnosis not present

## 2015-06-19 DIAGNOSIS — M545 Low back pain: Secondary | ICD-10-CM | POA: Diagnosis not present

## 2015-06-19 DIAGNOSIS — I252 Old myocardial infarction: Secondary | ICD-10-CM | POA: Diagnosis not present

## 2015-06-19 DIAGNOSIS — M40204 Unspecified kyphosis, thoracic region: Secondary | ICD-10-CM | POA: Diagnosis not present

## 2015-06-20 ENCOUNTER — Encounter: Payer: Self-pay | Admitting: *Deleted

## 2015-06-22 DIAGNOSIS — R269 Unspecified abnormalities of gait and mobility: Secondary | ICD-10-CM | POA: Diagnosis not present

## 2015-06-22 DIAGNOSIS — M545 Low back pain: Secondary | ICD-10-CM | POA: Diagnosis not present

## 2015-07-01 ENCOUNTER — Other Ambulatory Visit: Payer: Self-pay | Admitting: Family Medicine

## 2015-07-01 ENCOUNTER — Other Ambulatory Visit: Payer: Self-pay | Admitting: Cardiovascular Disease

## 2015-07-05 ENCOUNTER — Encounter: Payer: Self-pay | Admitting: Family Medicine

## 2015-07-05 ENCOUNTER — Ambulatory Visit (INDEPENDENT_AMBULATORY_CARE_PROVIDER_SITE_OTHER): Payer: Medicare Other | Admitting: Family Medicine

## 2015-07-05 VITALS — BP 102/60 | HR 60 | Temp 97.5°F | Ht 74.0 in | Wt 188.8 lb

## 2015-07-05 DIAGNOSIS — J069 Acute upper respiratory infection, unspecified: Secondary | ICD-10-CM | POA: Diagnosis not present

## 2015-07-05 MED ORDER — BENZONATATE 100 MG PO CAPS: 100.0000 mg | ORAL_CAPSULE | Freq: Two times a day (BID) | ORAL | Status: DC | PRN

## 2015-07-05 NOTE — Progress Notes (Signed)
Pre visit review using our clinic review tool, if applicable. No additional management support is needed unless otherwise documented below in the visit note. 

## 2015-07-05 NOTE — Progress Notes (Signed)
HPI:  Zachary Decker is a very pleasant 82 yo here for an acute visit for:  URI: -started: 4-5 days ago and now improving - reports almost canceled appt as is doing better -symptoms:nasal congestion - clear, drainage, cough -denies:fever, SOB, NVD, tooth pain, CP, malaise -has tried: nothing -sick contacts/travel/risks: denies flu exposure, tick exposure or or Ebola risks  ROS: See pertinent positives and negatives per HPI.  Past Medical History  Diagnosis Date  . CAD (coronary artery disease)   . Acute myocardial infarction of inferoposterior wall, subsequent episode of care ([place])   . Hypertension   . Hypercholesterolemia   . PVD (peripheral vascular disease) ([place])   . Personal history of thrombophlebitis   . Encounter for long-term (current) use of other medications   . Respiratory failure ([place])   . GERD (gastroesophageal reflux disease)   . Hypertrophy of prostate with urinary obstruction and other lower urinary tract symptoms (LUTS)   . Unspecified adverse effect of unspecified drug, medicinal and biological substance   . Unspecified hypothyroidism   . Family history of malignant neoplasm of gastrointestinal tract     Past Surgical History  Procedure Laterality Date  . Tonsillectomy    . Knee surgery    . Coronary artery bypass graft      x 5 on August 29, 2008  . Tachecostomy placement      after heart attack  . Cholecystectomy  02/26/2012    Procedure: LAPAROSCOPIC CHOLECYSTECTOMY WITH INTRAOPERATIVE CHOLANGIOGRAM;  Surgeon: Madilyn Hook, DO;  Location: [facility] OR;  Service: General;  Laterality: N/A;    Family History  Problem Relation Age of Onset  . Heart disease Mother   . Tuberculosis Father     Social History   Social History  . Marital Status: Married    Spouse Name: N/A  . Number of Children: N/A  . Years of Education: N/A   Social History Main Topics  . Smoking status: Never Smoker   . Smokeless tobacco: None  . Alcohol Use: No  . Drug Use: No    . Sexual Activity: Not Asked   Other Topics Concern  . None   Social History Narrative   Married 1958. 2 kids (boy, girl). 2 grandkids (boy and girl).       Retired from [employer] ([employer] in [place])      [place]: former [employer] cabin crew, tv, reading     Current outpatient prescriptions:  .  aspirin 81 MG tablet, Take 81 mg by mouth daily.  , Disp: , Rfl:  .  B Complex-C-Folic Acid (FOLBEE PLUS) TABS, TAKE 1 TABLET BY MOUTH DAILY., Disp: 30 tablet, Rfl: 6 .  carvedilol (COREG) 3.125 MG tablet, TAKE 1 TABLET (3.125 MG TOTAL) BY MOUTH 2 (TWO) TIMES DAILY WITH A MEAL., Disp: 180 tablet, Rfl: 1 .  diazepam (VALIUM) 5 MG tablet, Take 1 tablet (5 mg total) by mouth every 8 (eight) hours as needed. For anxiety, Disp: 20 tablet, Rfl: 0 .  levothyroxine (SYNTHROID, LEVOTHROID) 50 MCG tablet, Take 1 tablet (50 mcg total) by mouth daily., Disp: 30 tablet, Rfl: 11 .  lisinopril (PRINIVIL,ZESTRIL) 5 MG tablet, TAKE 1 TABLET (5 MG TOTAL) BY MOUTH DAILY., Disp: 90 tablet, Rfl: 2 .  nitroGLYCERIN (NITROSTAT) 0.4 MG SL tablet, Place 1 tablet (0.4 mg total) under the tongue every 5 (five) minutes as needed for chest pain., Disp: 25 tablet, Rfl: 6 .  ranitidine (ZANTAC) 300 MG tablet, TAKE 1 TABLET (300 MG TOTAL) BY MOUTH  AT BEDTIME., Disp: 30 tablet, Rfl: 6 .  rosuvastatin (CRESTOR) 5 MG tablet, Take 1 tablet (5 mg total) by mouth daily at 6 PM. Generic Rosuvastatin ONLY. Crestor not covered in 2017. Plan reports $10 for 90 days., Disp: 90 tablet, Rfl: 3 .  tamsulosin (FLOMAX) 0.4 MG CAPS capsule, TAKE 1 CAPSULE (0.4 MG TOTAL) BY MOUTH DAILY., Disp: 30 capsule, Rfl: 5 .  benzonatate (TESSALON) 100 MG capsule, Take 1 capsule (100 mg total) by mouth 2 (two) times daily as needed for cough., Disp: 20 capsule, Rfl: 0  EXAM:  Filed Vitals:   07/05/15 1309  BP: 102/60  Pulse: 60  Temp: 97.5 F (36.4 C)    Body mass index is 24.23 kg/(m^2).  GENERAL: vitals reviewed and listed above, alert, oriented,  appears well hydrated and in no acute distress  HEENT: atraumatic, conjunttiva clear, no obvious abnormalities on inspection of external nose and ears, normal appearance of ear canals and TMs, clear nasal congestion, mild post oropharyngeal erythema with PND, no tonsillar edema or exudate, no sinus TTP  NECK: no obvious masses on inspection  LUNGS: clear to auscultation bilaterally, no wheezes, rales or rhonchi, good air movement  CV: HRRR, no peripheral edema  MS: moves all extremities without noticeable abnormality  PSYCH: pleasant and cooperative, no obvious depression or anxiety  ASSESSMENT AND PLAN:  Discussed the following assessment and plan:  Acute upper respiratory infection  -given HPI and exam findings today, a serious infection or illness is unlikely. We discussed potential etiologies, with VURI being most likely, and advised supportive care and monitoring. We discussed treatment side effects, likely course, antibiotic misuse, transmission, and signs of developing a serious illness. -of course, we advised to return or notify a doctor immediately if symptoms worsen or persist or new concerns arise.    Patient Instructions  INSTRUCTIONS FOR UPPER RESPIRATORY INFECTION:  -plenty of rest and fluids  -nasal saline wash 2-3 times daily (use prepackaged nasal saline or bottled/distilled water if making your own)   -can use AFRIN nasal spray for drainage and nasal congestion - but do NOT use longer then 3-4 days  -in the winter time, using a humidifier at night is helpful (please follow cleaning instructions)  -if you are taking a cough medication - use only as directed, may also try a teaspoon of honey to coat the throat and throat lozenges. If given a cough medication with codeine or hydrocodone or other narcotic please be advised that this contains a strong and  potentially addicting medication. Please follow instructions carefully, take as little as possible and only use  AS NEEDED for severe cough. Discuss potential side effects with your pharmacy. Please do not drive or operate machinery while taking these types of medications. Please do not take other sedating medications, drugs or alcohol while taking this medication without discussing with your doctor.  -for sore throat, salt water gargles can help  -follow up if you have fevers, facial pain, tooth pain, difficulty breathing or are worsening or symptoms persist longer then expected  Upper Respiratory Infection, Adult An upper respiratory infection (URI) is also known as the common cold. It is often caused by a type of germ (virus). Colds are easily spread (contagious). You can pass it to others by kissing, coughing, sneezing, or drinking out of the same glass. Usually, you get better in 1 to 3  weeks.  However, the cough can last for even longer. HOME CARE   Only take medicine as told by your  doctor. Follow instructions provided above.  Drink enough water and fluids to keep your pee (urine) clear or pale yellow.  Get plenty of rest.  Return to work when your temperature is < 100 for 24 hours or as told by your doctor. You may use a face mask and wash your hands to stop your cold from spreading. GET HELP RIGHT AWAY IF:   After the first few days, you feel you are getting worse.  You have questions about your medicine.  You have chills, shortness of breath, or red spit (mucus).  You have pain in the face for more then 1-2 days, especially when you bend forward.  You have a fever, puffy (swollen) neck, pain when you swallow, or white spots in the back of your throat.  You have a bad headache, ear pain, sinus pain, or chest pain.  You have a high-pitched whistling sound when you breathe in and out (wheezing).  You cough up blood.  You have sore muscles or a stiff neck. MAKE SURE YOU:   Understand these instructions.  Will watch your condition.  Will get help right away if you are not doing  well or get worse. Document Released: 11/26/2007 Document Revised: 09/01/2011 Document Reviewed: 09/14/2013 [facility] Patient Information 2015 [place], [place]. This information is not intended to replace advice given to you by your health care provider. Make sure you discuss any questions you have with your health care provider.      Colin Benton R.

## 2015-07-05 NOTE — Patient Instructions (Signed)
INSTRUCTIONS FOR UPPER RESPIRATORY INFECTION:  -plenty of rest and fluids  -nasal saline wash 2-3 times daily (use prepackaged nasal saline or bottled/distilled water if making your own)   -can use AFRIN nasal spray for drainage and nasal congestion - but do NOT use longer then 3-4 days  -in the winter time, using a humidifier at night is helpful (please follow cleaning instructions)  -if you are taking a cough medication - use only as directed, may also try a teaspoon of honey to coat the throat and throat lozenges. If given a cough medication with codeine or hydrocodone or other narcotic please be advised that this contains a strong and  potentially addicting medication. Please follow instructions carefully, take as little as possible and only use AS NEEDED for severe cough. Discuss potential side effects with your pharmacy. Please do not drive or operate machinery while taking these types of medications. Please do not take other sedating medications, drugs or alcohol while taking this medication without discussing with your doctor.  -for sore throat, salt water gargles can help  -follow up if you have fevers, facial pain, tooth pain, difficulty breathing or are worsening or symptoms persist longer then expected  Upper Respiratory Infection, Adult An upper respiratory infection (URI) is also known as the common cold. It is often caused by a type of germ (virus). Colds are easily spread (contagious). You can pass it to others by kissing, coughing, sneezing, or drinking out of the same glass. Usually, you get better in 1 to 3  weeks.  However, the cough can last for even longer. HOME CARE   Only take medicine as told by your doctor. Follow instructions provided above.  Drink enough water and fluids to keep your pee (urine) clear or pale yellow.  Get plenty of rest.  Return to work when your temperature is < 100 for 24 hours or as told by your doctor. You may use a face mask and wash your  hands to stop your cold from spreading. GET HELP RIGHT AWAY IF:   After the first few days, you feel you are getting worse.  You have questions about your medicine.  You have chills, shortness of breath, or red spit (mucus).  You have pain in the face for more then 1-2 days, especially when you bend forward.  You have a fever, puffy (swollen) neck, pain when you swallow, or white spots in the back of your throat.  You have a bad headache, ear pain, sinus pain, or chest pain.  You have a high-pitched whistling sound when you breathe in and out (wheezing).  You cough up blood.  You have sore muscles or a stiff neck. MAKE SURE YOU:   Understand these instructions.  Will watch your condition.  Will get help right away if you are not doing well or get worse. Document Released: 11/26/2007 Document Revised: 09/01/2011 Document Reviewed: 09/14/2013 [employer] Patient Information 2015 [employer]. This information is not intended to replace advice given to you by your health care provider. Make sure you discuss any questions you have with your health care provider.  

## 2015-08-08 ENCOUNTER — Other Ambulatory Visit: Payer: Self-pay | Admitting: Family Medicine

## 2015-08-08 MED ORDER — RANITIDINE HCL 300 MG PO TABS: ORAL_TABLET | ORAL | Status: DC

## 2015-09-02 ENCOUNTER — Other Ambulatory Visit: Payer: Self-pay | Admitting: Family Medicine

## 2015-09-27 ENCOUNTER — Other Ambulatory Visit (INDEPENDENT_AMBULATORY_CARE_PROVIDER_SITE_OTHER): Payer: Medicare Other

## 2015-09-27 DIAGNOSIS — N183 Chronic kidney disease, stage 3 unspecified: Secondary | ICD-10-CM

## 2015-09-27 LAB — BASIC METABOLIC PANEL
BUN: 25 mg/dL — AB (ref 6–23)
CALCIUM: 9.1 mg/dL (ref 8.4–10.5)
CO2: 29 meq/L (ref 19–32)
Chloride: 104 mEq/L (ref 96–112)
Creatinine, Ser: 1.36 mg/dL (ref 0.40–1.50)
GFR: 53.55 mL/min — AB (ref >60.00)
GLUCOSE: 96 mg/dL (ref 70–99)
Potassium: 4.2 mEq/L (ref 3.5–5.1)
Sodium: 138 mEq/L (ref 135–145)

## 2015-10-04 ENCOUNTER — Encounter: Payer: Self-pay | Admitting: Family Medicine

## 2015-10-04 ENCOUNTER — Ambulatory Visit (INDEPENDENT_AMBULATORY_CARE_PROVIDER_SITE_OTHER): Payer: Medicare Other | Admitting: Family Medicine

## 2015-10-04 VITALS — BP 100/60 | HR 54 | Temp 97.7°F | Wt 190.0 lb

## 2015-10-04 DIAGNOSIS — E7800 Pure hypercholesterolemia, unspecified: Secondary | ICD-10-CM

## 2015-10-04 DIAGNOSIS — N183 Chronic kidney disease, stage 3 unspecified: Secondary | ICD-10-CM

## 2015-10-04 DIAGNOSIS — I10 Essential (primary) hypertension: Secondary | ICD-10-CM | POA: Diagnosis not present

## 2015-10-04 MED ORDER — DIAZEPAM 5 MG PO TABS: 5.0000 mg | ORAL_TABLET | Freq: Three times a day (TID) | ORAL | Status: DC | PRN

## 2015-10-04 MED ORDER — LISINOPRIL 2.5 MG PO TABS: 2.5000 mg | ORAL_TABLET | Freq: Every day | ORAL | Status: DC

## 2015-10-04 NOTE — Assessment & Plan Note (Signed)
S: perhaps overcontrolled on lisinopril 5mg  and carvedilol 3.125mg  BID. He trialed off lisinopril and BP went to 160 BP Readings from Last 3 Encounters:  10/04/15 100/60  07/05/15 102/60  04/19/15 110/72  A/P:Continue current meds:  Reduce dose of lisinopril to 2.5mg  though

## 2015-10-04 NOTE — Progress Notes (Signed)
Garret Reddish, MD  Subjective:  Zachary Decker is a 82 y.o. year old very pleasant male patient who presents for/with See problem oriented charting ROS- No chest pain or shortness of breath. No headache or blurry vision. Does have some balance issues at times- encouraged regular cane use  Past Medical History-  Patient Active Problem List   Diagnosis Date Noted  . CAD w/ hx MI s/p CABG 09/07/2012    Priority: High  . Cardiomyopathy, ischemic 03/22/2010    Priority: High  . CKD (chronic kidney disease), stage III 04/20/2014    Priority: Medium  . Anxiety state 04/17/2014    Priority: Medium  . HYPERCHOLESTEROLEMIA 10/31/2008    Priority: Medium  . BPH (benign prostatic hyperplasia) 09/28/2007    Priority: Medium  . Hypothyroidism 03/26/2007    Priority: Medium  . Essential hypertension 03/22/2007    Priority: Medium  . ANEMIA, VITAMIN B12 DEFICIENCY 04/22/2010    Priority: Low  . Actinic keratosis 06/25/2009    Priority: Low  . BASAL CELL CARCINOMA, NOSE 02/05/2009    Priority: Low  . Localized osteoarthrosis, lower leg 12/06/2008    Priority: Low  . GERD 03/22/2007    Priority: Low  . Low back pain 04/19/2015    Medications- reviewed and updated Current Outpatient Prescriptions  Medication Sig Dispense Refill  . aspirin 81 MG tablet Take 81 mg by mouth daily.      . B Complex-C-Folic Acid (FOLBEE PLUS) TABS TAKE 1 TABLET BY MOUTH DAILY. 30 tablet 6  . carvedilol (COREG) 3.125 MG tablet TAKE 1 TABLET (3.125 MG TOTAL) BY MOUTH 2 (TWO) TIMES DAILY WITH A MEAL. 180 tablet 1  . levothyroxine (SYNTHROID, LEVOTHROID) 50 MCG tablet Take 1 tablet (50 mcg total) by mouth daily. 30 tablet 11  . lisinopril (PRINIVIL,ZESTRIL) 2.5 MG tablet Take 1 tablet (2.5 mg total) by mouth daily. 90 tablet 3  . ranitidine (ZANTAC) 300 MG tablet TAKE 1 TABLET (300 MG TOTAL) BY MOUTH AT BEDTIME. 90 tablet 1  . rosuvastatin (CRESTOR) 5 MG tablet Take 1 tablet (5 mg total) by mouth daily at 6 PM.  Generic Rosuvastatin ONLY. Crestor not covered in 2017. Plan reports $10 for 90 days. 90 tablet 3  . tamsulosin (FLOMAX) 0.4 MG CAPS capsule TAKE 1 CAPSULE (0.4 MG TOTAL) BY MOUTH DAILY. 30 capsule 5  . diazepam (VALIUM) 5 MG tablet Take 1 tablet (5 mg total) by mouth every 8 (eight) hours as needed. For anxiety 20 tablet 0  . nitroGLYCERIN (NITROSTAT) 0.4 MG SL tablet Place 1 tablet (0.4 mg total) under the tongue every 5 (five) minutes as needed for chest pain. (Patient not taking: Reported on 10/04/2015) 25 tablet 6   No current facility-administered medications for this visit.    Objective: BP 100/60 mmHg  Pulse 54  Temp(Src) 97.7 F (36.5 C)  Wt 190 lb (86.183 kg) Gen: NAD, resting comfortably Mucous membranes are moist. CV: RRR no murmurs rubs or gallops Lungs: CTAB no crackles, wheeze, rhonchi Abdomen: soft/nontender/nondistended/normal bowel sounds. No rebound or guarding.  Ext: no edema Skin: warm, dry Neuro: grossly normal, moves all extremities, slow gait  Assessment/Plan:  HYPERCHOLESTEROLEMIA S: well controlled on crestor 40mg , changed to rosuvastatin. No myalgias.  Lab Results  Component Value Date   CHOL 116 04/19/2015   HDL 40.70 04/19/2015   LDLCALC 60 04/19/2015   LDLDIRECT 56.3 04/18/2014   TRIG 74.0 04/19/2015   CHOLHDL 3 04/19/2015   A/P: doing well on current dose of generic,  test lipids next visit   Essential hypertension S: perhaps overcontrolled on lisinopril 5mg  and carvedilol 3.125mg  BID. He trialed off lisinopril and BP went to 160 BP Readings from Last 3 Encounters:  10/04/15 100/60  07/05/15 102/60  04/19/15 110/72  A/P:Continue current meds:  Reduce dose of lisinopril to 2.5mg  though  CKD (chronic kidney disease), stage III S: GFR stable in 50s on check today A/P: BP controlled but overcontrolled- reduce lisinopril dose. No other changes. Monitor at least every 6 months.    Return in about 6 months (around 04/04/2016) for annual  wellness visit. Return precautions advised.   Meds ordered this encounter  Medications  . diazepam (VALIUM) 5 MG tablet    Sig: Take 1 tablet (5 mg total) by mouth every 8 (eight) hours as needed. For anxiety    Dispense:  20 tablet    Refill:  0  . lisinopril (PRINIVIL,ZESTRIL) 2.5 MG tablet    Sig: Take 1 tablet (2.5 mg total) by mouth daily.    Dispense:  90 tablet    Refill:  3

## 2015-10-04 NOTE — Assessment & Plan Note (Signed)
S: GFR stable in 50s on check today A/P: BP controlled but overcontrolled- reduce lisinopril dose. No other changes. Monitor at least every 6 months.

## 2015-10-04 NOTE — Patient Instructions (Signed)
Reduce lisinopril to 2.5mg . Let me know by mychart if your blood pressure is improving. i would like closer to 120/80  Kidney function is stable to slightly improved  Refilled valium for sparing use- must take prescription to pharmacy

## 2015-10-04 NOTE — Assessment & Plan Note (Signed)
S: well controlled on crestor 40mg , changed to rosuvastatin. No myalgias.  Lab Results  Component Value Date   CHOL 116 04/19/2015   HDL 40.70 04/19/2015   LDLCALC 60 04/19/2015   LDLDIRECT 56.3 04/18/2014   TRIG 74.0 04/19/2015   CHOLHDL 3 04/19/2015   A/P: doing well on current dose of generic, test lipids next visit

## 2015-10-12 ENCOUNTER — Ambulatory Visit (INDEPENDENT_AMBULATORY_CARE_PROVIDER_SITE_OTHER): Payer: Medicare Other | Admitting: Cardiovascular Disease

## 2015-10-12 VITALS — BP 118/74 | HR 54 | Ht 74.0 in | Wt 187.8 lb

## 2015-10-12 DIAGNOSIS — E7800 Pure hypercholesterolemia, unspecified: Secondary | ICD-10-CM

## 2015-10-12 DIAGNOSIS — I10 Essential (primary) hypertension: Secondary | ICD-10-CM | POA: Diagnosis not present

## 2015-10-12 DIAGNOSIS — I2510 Atherosclerotic heart disease of native coronary artery without angina pectoris: Secondary | ICD-10-CM

## 2015-10-12 DIAGNOSIS — I255 Ischemic cardiomyopathy: Secondary | ICD-10-CM

## 2015-10-12 NOTE — Patient Instructions (Signed)

## 2015-10-12 NOTE — Progress Notes (Signed)
Chief Complaint  Patient presents with  . Follow-up    6 Months, pt denied chest pain and SOB, pt denied swelling in legs and feet     History of Present Illness: 82 yo male with history of HTN, HLD, CAD s/p 5V CABG March 2010, ischemic cardiomyopathy, GERD, BPH here today for follow up. He has been followed in the past by Dr. Lia Foyer. Admitted March 2010 with inferior MI complicated by VF arrest multiple times and unsuccessful attempt at PCI in the setting of severe multi-vessel CAD. He then had an urgent 5 V CABG. He is known to have a cardiomyopathy with last echo 2013 showing LVEF 35-40%. Stress myoview 03/08/13 with scar but no ischemia, LVEF=46%.  He is here today for follow up. He has been feeling well. No chest pain or SOB. He continues to exercise three days per week. He has been using the elliptical and bike , weights.  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   Primary Care Physician: Garret Reddish   Past Medical History  Diagnosis Date  . CAD (coronary artery disease)   . Acute myocardial infarction of inferoposterior wall, subsequent episode of care ([place])   . Hypertension   . Hypercholesterolemia   . PVD (peripheral vascular disease) ([place])   . Personal history of thrombophlebitis   . Encounter for long-term (current) use of other medications   . Respiratory failure ([place])   . GERD (gastroesophageal reflux disease)   . Hypertrophy of prostate with urinary obstruction and other lower urinary tract symptoms (LUTS)   . Unspecified adverse effect of unspecified drug, medicinal and biological substance   . Unspecified hypothyroidism   . Family history of malignant neoplasm of gastrointestinal tract     Past Surgical History  Procedure Laterality Date  . Tonsillectomy    . Knee surgery    . Coronary artery bypass graft      x 5 on August 29, 2008  . Tachecostomy placement      after heart attack  . Cholecystectomy  02/26/2012    Procedure: LAPAROSCOPIC  CHOLECYSTECTOMY WITH INTRAOPERATIVE CHOLANGIOGRAM;  Surgeon: Madilyn Hook, DO;  Location: [facility];  Service: General;  Laterality: N/A;    Current Outpatient Prescriptions  Medication Sig Dispense Refill  . aspirin 81 MG tablet Take 81 mg by mouth daily.      . B Complex-C-Folic Acid (FOLBEE PLUS) TABS TAKE 1 TABLET BY MOUTH DAILY. 30 tablet 6  . carvedilol (COREG) 3.125 MG tablet TAKE 1 TABLET (3.125 MG TOTAL) BY MOUTH 2 (TWO) TIMES DAILY WITH A MEAL. 180 tablet 1  . diazepam (VALIUM) 5 MG tablet Take 1 tablet (5 mg total) by mouth every 8 (eight) hours as needed. For anxiety 20 tablet 0  . levothyroxine (SYNTHROID, LEVOTHROID) 50 MCG tablet Take 1 tablet (50 mcg total) by mouth daily. 30 tablet 11  . lisinopril (PRINIVIL,ZESTRIL) 2.5 MG tablet Take 1 tablet (2.5 mg total) by mouth daily. 90 tablet 3  . nitroGLYCERIN (NITROSTAT) 0.4 MG SL tablet Place 1 tablet (0.4 mg total) under the tongue every 5 (five) minutes as needed for chest pain. 25 tablet 6  . ranitidine (ZANTAC) 300 MG tablet TAKE 1 TABLET (300 MG TOTAL) BY MOUTH AT BEDTIME. 90 tablet 1  . rosuvastatin (CRESTOR) 5 MG tablet Take 1 tablet (5 mg total) by mouth daily at 6 PM. Generic Rosuvastatin ONLY. Crestor not covered in 2017. Plan reports $10 for 90 days. 90 tablet 3  . tamsulosin (  FLOMAX) 0.4 MG CAPS capsule TAKE 1 CAPSULE (0.4 MG TOTAL) BY MOUTH DAILY. 30 capsule 5   No current facility-administered medications for this visit.    Allergies  Allergen Reactions  . Losartan Potassium Other (See Comments)    Not known  . Naproxen Nausea And Vomiting  . Penicillins Rash    Social History   Social History  . Marital Status: Married    Spouse Name: N/A  . Number of Children: N/A  . Years of Education: N/A   Occupational History  . Not on file.   Social History Main Topics  . Smoking status: Never Smoker   . Smokeless tobacco: Not on file  . Alcohol Use: No  . Drug Use: No  . Sexual Activity: Not on file   Other  Topics Concern  . Not on file   Social History Narrative   Married 1958. 2 kids (boy, girl). 2 grandkids (boy and girl).       Retired from [employer] ([employer] in [place])      Hobbies: former Air cabin crew, tv, reading    Family History  Problem Relation Age of Onset  . Heart disease Mother   . Tuberculosis Father     Review of Systems:  As stated in the HPI and otherwise negative.   BP 118/74 mmHg  Pulse 54  Ht 6\' 2"  (1.88 m)  Wt 187 lb 12.8 oz (85.186 kg)  BMI 24.10 kg/m2  Physical Examination: General: Well developed, well nourished, NAD HEENT: OP clear, mucus membranes moist SKIN: warm, dry. No rashes. Neuro: No focal deficits Musculoskeletal: Muscle strength 5/5 all ext Psychiatric: Mood and affect normal Neck: No JVD, no carotid bruits, no thyromegaly, no lymphadenopathy. Lungs:Clear bilaterally, no wheezes, rhonci, crackles Cardiovascular: Regular rate and rhythm. No murmurs, gallops or rubs. Abdomen:Soft. Bowel sounds present. Non-tender.  Extremities: No lower extremity edema. Pulses are 2 + in the bilateral DP/PT.  Echo 09/07/12:  Left ventricle: The cavity size was normal. Wall thickness was normal. Systolic function was moderately reduced. The estimated ejection fraction was in the range of 35% to 40%. There is akinesis of the inferoposterior myocardium. Doppler parameters are consistent with abnormal left ventricular relaxation (grade 1 diastolic dysfunction). - Mitral valve: Mild regurgitation. - Left atrium: The atrium was mildly dilated.  Stress myoview 03/08/13: Stress Procedure: The patient received IV Lexiscan 0.4 mg over 15-seconds. Technetium 34m Sestamibi injected at 30-seconds. This patient had sob and weak the Lexiscan injection. Quantitative spect images were obtained after a 45 minute delay.  Stress ECG: No significant change from baseline ECG  QPS  Raw Data Images: Normal; no motion artifact; normal heart/lung ratio.  Stress Images: There  is decreased uptake in the inferior wall and lateral wall  Rest Images: There is decreased uptake in the inferior wall and lateral wall.  Subtraction (SDS): There is a fixed defect that is most consistent with a previous infarction.  Transient Ischemic Dilatation (Normal <1.22): NA  Lung/Heart Ratio (Normal <0.45): 0.49  Quantitative Gated Spect Images  QGS EDV: 141 ml  QGS ESV: 76 ml  Impression  Exercise Capacity: Lexiscan with no exercise.  BP Response: Hypotensive blood pressure response.  Clinical Symptoms: There is dyspnea.  ECG Impression: No significant ST segment change suggestive of ischemia.  Comparison with Prior Nuclear Study: No images to compare  Overall Impression: Intermediate risk stress nuclear study. There is a large fixed perfusion defect involving the apex, inferior wall and lateral wall consistent with old MI. There  is no significant reversible ischemia. There is moderate LV systolic dysfunction with EF 46%.  LV Ejection Fraction: 46%. LV Wall Motion: Inferior wall severe hypokinesis.  EKG:  EKG is ordered today. The ekg ordered today demonstrates sinus bradycardia 1st degree AV block. Old inferior MI.   Recent Labs: 04/19/2015: ALT 23; Hemoglobin 13.7; Platelets 237.0; TSH 2.18 09/27/2015: BUN 25*; Creatinine, Ser 1.36; Potassium 4.2; Sodium 138   Lipid Panel    Component Value Date/Time   CHOL 116 04/19/2015 0943   TRIG 74.0 04/19/2015 0943   HDL 40.70 04/19/2015 0943   CHOLHDL 3 04/19/2015 0943   VLDL 14.8 04/19/2015 0943   LDLCALC 60 04/19/2015 0943   LDLDIRECT 56.3 04/18/2014 0902     Wt Readings from Last 3 Encounters:  10/12/15 187 lb 12.8 oz (85.186 kg)  10/04/15 190 lb (86.183 kg)  07/05/15 188 lb 12.8 oz (85.639 kg)     Other studies Reviewed: Additional studies/ records that were reviewed today include: . Review of the above records demonstrates:    Assessment and Plan:   1. CAD: He has no chest pain to suggest angina. Continue beta  blocker, ASA, statin, Ace-inh.   2. Ischemic Cardiomyopathy:  LVEF=46% by stress myoview 2014. He has no evidence of CHF. Continue medical therapy.   3. HTN:  Controlled. No changes today.   4. Hyperlipidemia: He is on a statin. Lipids are well controlled.   Current medicines are reviewed at length with the patient today.  The patient does not have concerns regarding medicines.  The following changes have been made:  no change  Labs/ tests ordered today include:  No orders of the defined types were placed in this encounter.    Disposition:   FU with me in 6 months  Signed, Lauree Chandler, MD 10/12/2015 11:38 AM    [facility] [place] [place], [place], [place]  [address] Phone: [phone]; Fax: [phone]

## 2015-11-07 ENCOUNTER — Other Ambulatory Visit: Payer: Self-pay | Admitting: Family Medicine

## 2016-01-02 ENCOUNTER — Other Ambulatory Visit: Payer: Self-pay | Admitting: *Deleted

## 2016-01-02 MED ORDER — CARVEDILOL 3.125 MG PO TABS: ORAL_TABLET | ORAL | Status: DC

## 2016-01-04 ENCOUNTER — Other Ambulatory Visit: Payer: Self-pay | Admitting: Family Medicine

## 2016-01-15 DIAGNOSIS — H02831 Dermatochalasis of right upper eyelid: Secondary | ICD-10-CM | POA: Diagnosis not present

## 2016-01-15 DIAGNOSIS — Z961 Presence of intraocular lens: Secondary | ICD-10-CM | POA: Diagnosis not present

## 2016-01-15 DIAGNOSIS — H43813 Vitreous degeneration, bilateral: Secondary | ICD-10-CM | POA: Diagnosis not present

## 2016-01-15 DIAGNOSIS — H02834 Dermatochalasis of left upper eyelid: Secondary | ICD-10-CM | POA: Diagnosis not present

## 2016-02-06 ENCOUNTER — Other Ambulatory Visit: Payer: Self-pay | Admitting: Family Medicine

## 2016-02-06 DIAGNOSIS — E7800 Pure hypercholesterolemia, unspecified: Secondary | ICD-10-CM

## 2016-02-29 ENCOUNTER — Other Ambulatory Visit: Payer: Self-pay | Admitting: Family Medicine

## 2016-03-29 ENCOUNTER — Other Ambulatory Visit: Payer: Self-pay | Admitting: Family Medicine

## 2016-04-08 ENCOUNTER — Ambulatory Visit (INDEPENDENT_AMBULATORY_CARE_PROVIDER_SITE_OTHER): Payer: Medicare Other | Admitting: Family Medicine

## 2016-04-08 ENCOUNTER — Encounter: Payer: Self-pay | Admitting: Family Medicine

## 2016-04-08 VITALS — BP 124/74 | HR 57 | Temp 97.6°F | Ht 70.0 in | Wt 187.8 lb

## 2016-04-08 DIAGNOSIS — Z0000 Encounter for general adult medical examination without abnormal findings: Secondary | ICD-10-CM

## 2016-04-08 DIAGNOSIS — I2510 Atherosclerotic heart disease of native coronary artery without angina pectoris: Secondary | ICD-10-CM

## 2016-04-08 DIAGNOSIS — Z23 Encounter for immunization: Secondary | ICD-10-CM

## 2016-04-08 DIAGNOSIS — E7800 Pure hypercholesterolemia, unspecified: Secondary | ICD-10-CM | POA: Diagnosis not present

## 2016-04-08 DIAGNOSIS — I10 Essential (primary) hypertension: Secondary | ICD-10-CM

## 2016-04-08 DIAGNOSIS — E034 Atrophy of thyroid (acquired): Secondary | ICD-10-CM

## 2016-04-08 DIAGNOSIS — F411 Generalized anxiety disorder: Secondary | ICD-10-CM

## 2016-04-08 DIAGNOSIS — I255 Ischemic cardiomyopathy: Secondary | ICD-10-CM

## 2016-04-08 DIAGNOSIS — N183 Chronic kidney disease, stage 3 (moderate): Secondary | ICD-10-CM

## 2016-04-08 LAB — CBC WITH DIFFERENTIAL/PLATELET
BASOS PCT: 0.6 % (ref 0.0–3.0)
Basophils Absolute: 0.1 10*3/uL (ref 0.0–0.1)
EOS ABS: 0.3 10*3/uL (ref 0.0–0.7)
EOS PCT: 3.5 % (ref 0.0–5.0)
HEMATOCRIT: 39.8 % (ref 39.0–52.0)
HEMOGLOBIN: 13.3 g/dL (ref 13.0–17.0)
LYMPHS PCT: 22 % (ref 12.0–46.0)
Lymphs Abs: 2 10*3/uL (ref 0.7–4.0)
MCHC: 33.5 g/dL (ref 30.0–36.0)
MCV: 100.1 fl — AB (ref 78.0–100.0)
MONOS PCT: 9.4 % (ref 3.0–12.0)
Monocytes Absolute: 0.9 10*3/uL (ref 0.1–1.0)
Neutro Abs: 5.9 10*3/uL (ref 1.4–7.7)
Neutrophils Relative %: 64.5 % (ref 43.0–77.0)
Platelets: 250 10*3/uL (ref 150.0–400.0)
RBC: 3.97 Mil/uL — ABNORMAL LOW (ref 4.22–5.81)
RDW: 13.1 % (ref 11.5–15.5)
WBC: 9.2 10*3/uL (ref 4.0–10.5)

## 2016-04-08 LAB — COMPREHENSIVE METABOLIC PANEL
ALBUMIN: 4.2 g/dL (ref 3.5–5.2)
ALT: 22 U/L (ref 0–53)
AST: 24 U/L (ref 0–37)
Alkaline Phosphatase: 48 U/L (ref 39–117)
BILIRUBIN TOTAL: 0.7 mg/dL (ref 0.2–1.2)
BUN: 26 mg/dL — ABNORMAL HIGH (ref 6–23)
CALCIUM: 9.2 mg/dL (ref 8.4–10.5)
CHLORIDE: 104 meq/L (ref 96–112)
CO2: 28 mEq/L (ref 19–32)
CREATININE: 1.26 mg/dL (ref 0.40–1.50)
GFR: 58.41 mL/min — ABNORMAL LOW (ref >60.00)
Glucose, Bld: 100 mg/dL — ABNORMAL HIGH (ref 70–99)
Potassium: 4.4 mEq/L (ref 3.5–5.1)
Sodium: 139 mEq/L (ref 135–145)
Total Protein: 6.8 g/dL (ref 6.0–8.3)

## 2016-04-08 LAB — LDL CHOLESTEROL, DIRECT: LDL DIRECT: 60 mg/dL

## 2016-04-08 LAB — TSH: TSH: 2.32 u[IU]/mL (ref 0.35–4.50)

## 2016-04-08 MED ORDER — LEVOTHYROXINE SODIUM 50 MCG PO TABS: ORAL_TABLET | ORAL | 3 refills | Status: DC

## 2016-04-08 MED ORDER — NITROGLYCERIN 0.4 MG SL SUBL: 0.4000 mg | SUBLINGUAL_TABLET | SUBLINGUAL | 6 refills | Status: DC | PRN

## 2016-04-08 MED ORDER — DIAZEPAM 5 MG PO TABS: 5.0000 mg | ORAL_TABLET | Freq: Three times a day (TID) | ORAL | 0 refills | Status: DC | PRN

## 2016-04-08 MED ORDER — LISINOPRIL 2.5 MG PO TABS: 2.5000 mg | ORAL_TABLET | Freq: Every day | ORAL | 3 refills | Status: DC

## 2016-04-08 MED ORDER — RANITIDINE HCL 300 MG PO TABS: ORAL_TABLET | ORAL | 3 refills | Status: DC

## 2016-04-08 MED ORDER — TAMSULOSIN HCL 0.4 MG PO CAPS: ORAL_CAPSULE | ORAL | 3 refills | Status: DC

## 2016-04-08 MED ORDER — CARVEDILOL 3.125 MG PO TABS: ORAL_TABLET | ORAL | 3 refills | Status: DC

## 2016-04-08 MED ORDER — ROSUVASTATIN CALCIUM 5 MG PO TABS: ORAL_TABLET | ORAL | 3 refills | Status: DC

## 2016-04-08 NOTE — Progress Notes (Signed)
Phone: [phone]  Subjective:  Patient presents today for their annual wellness visit.    Preventive Screening-Counseling & Management  Smoking Status: Never Smoker Second Hand Smoking status: No smokers in home  Risk Factors Regular exercise: 3x a week still Diet:  Weight stable, reasonable diet  Fall Risk: no falls, careful with cane   Cardiac risk factors:  Known CAD advanced age (older than 28 for men, 44 for women)  Hyperlipidemia - but controlled No diabetes.  HTN- but controlled  Depression Screen None. PHQ2 0   Activities of Daily Living Independent ADLs and IADLs   Hearing Difficulties: -patient declines  Cognitive Testing No reported trouble.   Normal 3 word recall  List the Names of Other Physician/Practitioners you currently use: -Dr. Angelena Form cardiology  Immunization History  Administered Date(s) Administered  . Influenza Split 03/28/2011, 03/25/2012  . Influenza Whole 06/23/2001, 03/26/2007, 03/24/2008, 04/04/2009, 03/13/2010  . Influenza,inj,Quad PF,36+ Mos 03/15/2013, 03/13/2014, 03/21/2015  . Pneumococcal Conjugate-13 10/17/2014  . Pneumococcal Polysaccharide-23 06/23/2005  . Td 06/23/2005   Required Immunizations needed today : flu shot before he leaves  Screening tests- up to date Health Maintenance Due  Topic Date Due  . TETANUS/TDAP - not active in places that put him at risk- declines 06/24/2015  . INFLUENZA VACCINE - today 01/22/2016   ROS- No pertinent positives discovered in course of AWV ROS- sometimes spits up phlegm in afternoon. No chest pain or shortness of breath. No headache or blurry vision. No unintentional weight changes.   The following were reviewed and entered/updated in epic: Past Medical History:  Diagnosis Date  . Acute myocardial infarction of inferoposterior wall, subsequent episode of care ([place])   . CAD (coronary artery disease)   . Encounter for long-term (current) use of other medications   .  Family history of malignant neoplasm of gastrointestinal tract   . GERD (gastroesophageal reflux disease)   . Hypercholesterolemia   . Hypertension   . Hypertrophy of prostate with urinary obstruction and other lower urinary tract symptoms (LUTS)   . Personal history of thrombophlebitis   . PVD (peripheral vascular disease) ([place])   . Respiratory failure ([place])   . Unspecified adverse effect of unspecified drug, medicinal and biological substance   . Unspecified hypothyroidism    Patient Active Problem List   Diagnosis Date Noted  . CAD w/ hx MI s/p CABG 09/07/2012    Priority: High  . Cardiomyopathy, ischemic 03/22/2010    Priority: High  . CKD (chronic kidney disease), stage III 04/20/2014    Priority: Medium  . Anxiety state 04/17/2014    Priority: Medium  . HYPERCHOLESTEROLEMIA 10/31/2008    Priority: Medium  . BPH (benign prostatic hyperplasia) 09/28/2007    Priority: Medium  . Hypothyroidism 03/26/2007    Priority: Medium  . Essential hypertension 03/22/2007    Priority: Medium  . ANEMIA, VITAMIN B12 DEFICIENCY 04/22/2010    Priority: Low  . Actinic keratosis 06/25/2009    Priority: Low  . BASAL CELL CARCINOMA, NOSE 02/05/2009    Priority: Low  . Localized osteoarthrosis, lower leg 12/06/2008    Priority: Low  . GERD 03/22/2007    Priority: Low  . Low back pain 04/19/2015   Past Surgical History:  Procedure Laterality Date  . CHOLECYSTECTOMY  02/26/2012   Procedure: LAPAROSCOPIC CHOLECYSTECTOMY WITH INTRAOPERATIVE CHOLANGIOGRAM;  Surgeon: Madilyn Hook, DO;  Location: [facility];  Service: General;  Laterality: N/A;  . CORONARY ARTERY BYPASS GRAFT     x 5 on August 29, 2008  .  KNEE SURGERY    . tachecostomy placement     after heart attack  . TONSILLECTOMY      Family History  Problem Relation Age of Onset  . Heart disease Mother   . Tuberculosis Father     Medications- reviewed and updated Current Outpatient Prescriptions  Medication Sig Dispense Refill  .  aspirin 81 MG tablet Take 81 mg by mouth daily.      . B Complex-C-Folic Acid (FOLBEE PLUS) TABS TAKE 1 TABLET BY MOUTH DAILY. 90 tablet 1  . carvedilol (COREG) 3.125 MG tablet TAKE 1 TABLET BY MOUTH 2 TIMES A DAY WITH MEAL 180 tablet 1  . diazepam (VALIUM) 5 MG tablet Take 1 tablet (5 mg total) by mouth every 8 (eight) hours as needed. For anxiety 20 tablet 0  . levothyroxine (SYNTHROID, LEVOTHROID) 50 MCG tablet TAKE 1 TABLET (50 MCG TOTAL) BY MOUTH DAILY. 30 tablet 11  . lisinopril (PRINIVIL,ZESTRIL) 2.5 MG tablet Take 1 tablet (2.5 mg total) by mouth daily. 90 tablet 3  . nitroGLYCERIN (NITROSTAT) 0.4 MG SL tablet Place 1 tablet (0.4 mg total) under the tongue every 5 (five) minutes as needed for chest pain. 25 tablet 6  . ranitidine (ZANTAC) 300 MG tablet TAKE 1 TABLET (300 MG TOTAL) BY MOUTH AT BEDTIME. 90 tablet 1  . rosuvastatin (CRESTOR) 5 MG tablet TAKE 1 TABLET BY MOUTH DAILY AT 6PM 90 tablet 3  . tamsulosin (FLOMAX) 0.4 MG CAPS capsule TAKE 1 CAPSULE (0.4 MG TOTAL) BY MOUTH DAILY. 30 capsule 5   Allergies-reviewed and updated Allergies  Allergen Reactions  . Losartan Potassium Other (See Comments)    Not known  . Naproxen Nausea And Vomiting  . Penicillins Rash    Social History   Social History  . Marital status: Married    Spouse name: N/A  . Number of children: N/A  . Years of education: N/A   Social History Main Topics  . Smoking status: Never Smoker  . Smokeless tobacco: None  . Alcohol use No  . Drug use: No  . Sexual activity: Not Asked   Other Topics Concern  . None   Social History Narrative   Married 1958. 2 kids (boy, girl). 2 grandkids (boy and girl).       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 Oropharynx normal CV: RRR no murmurs rubs or gallops Lungs: CTAB no crackles, wheeze, rhonchi Abdomen: soft/nontender/nondistended/normal bowel sounds. Only examined upright.   Ext: no edema Skin: warm, dry Neuro: grossly normal, moves all extremities, PERRLA, walks with cane  Assessment/Plan:  AWV completed- discussed recommended screenings anddocumented any personalized health advice and referrals for preventive counseling. See AVS as well which was given to patient.   Status of chronic or acute concerns   Refill valium for anxiety- uses about 20 in 2 years- lost last rx  CAD with ischemic cardiomyopathy S: continues to follow with Dr. Angelena Form. CABG 5 vessels in 2010. Last stress myoview with EF 46%. He is walking 3 days a week either through walking, weights, elliptical.  A/P: continue aspirin, BP and lipid control.   hypothyroidism S: Lab Results  Component Value Date   TSH 2.18 04/19/2015   On thyroid medication-50 mcg  ROS-No hair or nail changes. No heat/cold intolerance. No constipation or diarrhea. Denies shakiness or anxiety.  A/P: doing well, update tsh   HLD S: well controlled on crestor 5mg . No myalgias.  Lab Results  Component Value Date   CHOL 116 04/19/2015   HDL 40.70 04/19/2015   LDLCALC 60 04/19/2015   LDLDIRECT 56.3 04/18/2014   TRIG 74.0 04/19/2015   CHOLHDL 3 04/19/2015   A/P: continue current meds- update LDL  Hypertension S: controlled on lisinopril 2.5mg  and coreg 3.125mg  BID. Off lisinopril BP was to 160 BP Readings from Last 3 Encounters:  04/08/16 124/74  10/12/15 118/74  10/04/15 100/60  A/P:Continue current meds:  Doing very well  CKD III S: stable GFR in 50s previously A/P: continue to monitor GFR every 6 months  Return in about 6 months (around 10/07/2016).    Orders Placed This Encounter  Procedures  . CBC with  Differential/Platelet  . Comprehensive metabolic panel    [place]  . LDL cholesterol, direct    [place]  . TSH    [place]    Meds ordered this encounter  Medications  . diazepam (VALIUM) 5 MG tablet    Sig: Take 1 tablet (5 mg total) by mouth every 8 (eight) hours as needed. For anxiety    Dispense:  20 tablet    Refill:  0  . lisinopril (PRINIVIL,ZESTRIL) 2.5 MG tablet    Sig: Take 1 tablet (2.5 mg total) by mouth daily.    Dispense:  90 tablet    Refill:  3  . rosuvastatin (CRESTOR) 5 MG tablet    Sig: TAKE 1 TABLET BY MOUTH DAILY AT 6PM    Dispense:  90 tablet    Refill:  3  . carvedilol (COREG) 3.125 MG tablet    Sig: TAKE 1 TABLET BY MOUTH 2 TIMES A DAY WITH MEAL    Dispense:  180 tablet    Refill:  3  . nitroGLYCERIN (NITROSTAT) 0.4 MG SL tablet    Sig: Place 1 tablet (0.4 mg total) under the tongue every 5 (five) minutes as needed for chest pain.    Dispense:  25 tablet    Refill:  6  . tamsulosin (FLOMAX) 0.4 MG CAPS capsule    Sig: TAKE 1 CAPSULE (0.4 MG TOTAL) BY MOUTH DAILY.    Dispense:  90 capsule    Refill:  3  . levothyroxine (SYNTHROID, LEVOTHROID) 50 MCG tablet    Sig: TAKE 1 TABLET (50 MCG TOTAL) BY MOUTH DAILY.    Dispense:  90 tablet    Refill:  3  . ranitidine (ZANTAC) 300 MG tablet    Sig: TAKE 1 TABLET (300 MG TOTAL) BY MOUTH AT BEDTIME.    Dispense:  90 tablet    Refill:  3    Return precautions advised.  Garret Reddish, MD

## 2016-04-08 NOTE — Patient Instructions (Signed)
[name] , Thank you for taking time to come for your Medicare Wellness Visit. I appreciate your ongoing commitment to your health goals. Please review the following plan we discussed and let me know if I can assist you in the future.   These are the goals we discussed: 1. Get labs before you leave 2. Get flu shot 3. Continue your exercise 3x a week 4. Trial 650 or 1000mg  of tylenol 30 minutes before shower or going to church. Contact me by mychart in 1 month- we could also trial tramadol for this (a stronger pain medicine)   This is a list of the screening recommended for you and due dates:  Health Maintenance  Topic Date Due  . Tetanus Vaccine  06/24/2015  . Flu Shot  01/22/2016  . Shingles Vaccine  10/16/2024*  . Pneumonia vaccines  Completed  *Topic was postponed. The date shown is not the original due date.

## 2016-04-08 NOTE — Progress Notes (Signed)
Pre visit review using our clinic review tool, if applicable. No additional management support is needed unless otherwise documented below in the visit note. 

## 2016-04-08 NOTE — Addendum Note (Signed)
Addended by: Lyndle Herrlich on: 04/08/2016 09:28 AM   Modules accepted: Orders

## 2016-04-10 ENCOUNTER — Encounter: Payer: Self-pay | Admitting: Family Medicine

## 2016-04-13 NOTE — Progress Notes (Signed)
Chief Complaint  Patient presents with  . Back Pain    History of Present Illness: 82 yo male with history of HTN, HLD, CAD s/p 5V CABG March 2010, ischemic cardiomyopathy, GERD, BPH here today for follow up. He has been followed in the past by Dr. Lia Foyer. Admitted March 2010 with inferior MI complicated by VF arrest multiple times and unsuccessful attempt at PCI in the setting of severe multi-vessel CAD. He then had an urgent 5 V CABG. He is known to have a cardiomyopathy with last echo 2013 showing LVEF 35-40%. Stress myoview 03/08/13 with scar but no ischemia, LVEF=46%.  He is here today for follow up. He has been feeling well. No chest pain or SOB. He continues to exercise three days per week but he is much slower now with ongoing back pain. He has severe back pain. He has been told there is nothing that can be done. He is using a cane. 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  Primary Care Physician: Garret Reddish, MD   Past Medical History:  Diagnosis Date  . Acute myocardial infarction of inferoposterior wall, subsequent episode of care ([place])   . CAD (coronary artery disease)   . Encounter for long-term (current) use of other medications   . Family history of malignant neoplasm of gastrointestinal tract   . GERD (gastroesophageal reflux disease)   . Hypercholesterolemia   . Hypertension   . Hypertrophy of prostate with urinary obstruction and other lower urinary tract symptoms (LUTS)   . Personal history of thrombophlebitis   . PVD (peripheral vascular disease) ([place])   . Respiratory failure ([place])   . Unspecified adverse effect of unspecified drug, medicinal and biological substance   . Unspecified hypothyroidism     Past Surgical History:  Procedure Laterality Date  . CHOLECYSTECTOMY  02/26/2012   Procedure: LAPAROSCOPIC CHOLECYSTECTOMY WITH INTRAOPERATIVE CHOLANGIOGRAM;  Surgeon: Madilyn Hook, DO;  Location: [facility];  Service: General;  Laterality: N/A;  .  CORONARY ARTERY BYPASS GRAFT     x 5 on August 29, 2008  . KNEE SURGERY    . tachecostomy placement     after heart attack  . TONSILLECTOMY      Current Outpatient Prescriptions  Medication Sig Dispense Refill  . Acetaminophen (TYLENOL EXTRA STRENGTH PO) Take 1-2 tablets by mouth daily as needed (for back pain).    Marland Kitchen aspirin 81 MG tablet Take 81 mg by mouth daily.      . B Complex-C-Folic Acid (FOLBEE PLUS) TABS TAKE 1 TABLET BY MOUTH DAILY. 90 tablet 1  . carvedilol (COREG) 3.125 MG tablet TAKE 1 TABLET BY MOUTH 2 TIMES A DAY WITH MEAL 180 tablet 3  . diazepam (VALIUM) 5 MG tablet Take 1 tablet (5 mg total) by mouth every 8 (eight) hours as needed. For anxiety 20 tablet 0  . levothyroxine (SYNTHROID, LEVOTHROID) 50 MCG tablet TAKE 1 TABLET (50 MCG TOTAL) BY MOUTH DAILY. 90 tablet 3  . lisinopril (PRINIVIL,ZESTRIL) 2.5 MG tablet Take 1 tablet (2.5 mg total) by mouth daily. 90 tablet 3  . ranitidine (ZANTAC) 300 MG tablet TAKE 1 TABLET (300 MG TOTAL) BY MOUTH AT BEDTIME. 90 tablet 3  . rosuvastatin (CRESTOR) 5 MG tablet TAKE 1 TABLET BY MOUTH DAILY AT 6PM 90 tablet 3  . tamsulosin (FLOMAX) 0.4 MG CAPS capsule TAKE 1 CAPSULE (0.4 MG TOTAL) BY MOUTH DAILY. 90 capsule 3  . nitroGLYCERIN (NITROSTAT) 0.4 MG SL tablet Place 1 tablet (0.4 mg total) under  the tongue every 5 (five) minutes as needed for chest pain. (Patient not taking: Reported on 04/14/2016) 25 tablet 6   No current facility-administered medications for this visit.     Allergies  Allergen Reactions  . Losartan Potassium Other (See Comments)    Not known  . Naproxen Nausea And Vomiting  . Penicillins Rash    Social History   Social History  . Marital status: Married    Spouse name: N/A  . Number of children: N/A  . Years of education: N/A   Occupational History  . Not on file.   Social History Main Topics  . Smoking status: Never Smoker  . Smokeless tobacco: Not on file  . Alcohol use No  . Drug use: No  . Sexual  activity: Not on file   Other Topics Concern  . Not on file   Social History Narrative   Married 1958. 2 kids (boy, girl). 2 grandkids (boy and girl).       Retired from [employer] in [place])      Hobbies: former Air cabin crew, tv, reading    Family History  Problem Relation Age of Onset  . Heart disease Mother   . Tuberculosis Father     Review of Systems:  As stated in the HPI and otherwise negative.   BP 124/62   Pulse (!) 53   Ht 5\' 10"  (1.778 m)   Wt 190 lb (86.2 kg)   BMI 27.26 kg/m   Physical Examination: General: Well developed, well nourished, NAD  HEENT: OP clear, mucus membranes moist  SKIN: warm, dry. No rashes. Neuro: No focal deficits  Musculoskeletal: Muscle strength 5/5 all ext  Psychiatric: Mood and affect normal  Neck: No JVD, no carotid bruits, no thyromegaly, no lymphadenopathy.  Lungs:Clear bilaterally, no wheezes, rhonci, crackles Cardiovascular: Regular rate and rhythm. No murmurs, gallops or rubs. Abdomen:Soft. Bowel sounds present. Non-tender.  Extremities: No lower extremity edema. Pulses are 2 + in the bilateral DP/PT.  Echo 09/08/11:  Left ventricle: The cavity size was normal. Wall thickness was normal. Systolic function was moderately reduced. The estimated ejection fraction was in the range of 35% to 40%. There is akinesis of the inferoposterior myocardium. Doppler parameters are consistent with abnormal left ventricular relaxation (grade 1 diastolic dysfunction). - Mitral valve: Mild regurgitation. - Left atrium: The atrium was mildly dilated.  Stress myoview 03/08/13: Stress Procedure: The patient received IV Lexiscan 0.4 mg over 15-seconds. Technetium 20m Sestamibi injected at 30-seconds. This patient had sob and weak the Lexiscan injection. Quantitative spect images were obtained after a 45 minute delay.  Stress ECG: No significant change from baseline ECG  QPS  Raw Data Images: Normal; no motion artifact; normal  heart/lung ratio.  Stress Images: There is decreased uptake in the inferior wall and lateral wall  Rest Images: There is decreased uptake in the inferior wall and lateral wall.  Subtraction (SDS): There is a fixed defect that is most consistent with a previous infarction.  Transient Ischemic Dilatation (Normal <1.22): NA  Lung/Heart Ratio (Normal <0.45): 0.49  Quantitative Gated Spect Images  QGS EDV: 141 ml  QGS ESV: 76 ml  Impression  Exercise Capacity: Lexiscan with no exercise.  BP Response: Hypotensive blood pressure response.  Clinical Symptoms: There is dyspnea.  ECG Impression: No significant ST segment change suggestive of ischemia.  Comparison with Prior Nuclear Study: No images to compare  Overall Impression: Intermediate risk stress nuclear study. There is a large fixed perfusion defect involving  the apex, inferior wall and lateral wall consistent with old MI. There is no significant reversible ischemia. There is moderate LV systolic dysfunction with EF 46%.  LV Ejection Fraction: 46%. LV Wall Motion: Inferior wall severe hypokinesis.  EKG:  EKG is ordered today. The ekg ordered today demonstrates sinus bradycardia, rate 53 bpm. Old inferior MI.   Recent Labs: 04/08/2016: ALT 22; BUN 26; Creatinine, Ser 1.26; Hemoglobin 13.3; Platelets 250.0; Potassium 4.4; Sodium 139; TSH 2.32   Lipid Panel    Component Value Date/Time   CHOL 116 04/19/2015 0943   TRIG 74.0 04/19/2015 0943   HDL 40.70 04/19/2015 0943   CHOLHDL 3 04/19/2015 0943   VLDL 14.8 04/19/2015 0943   LDLCALC 60 04/19/2015 0943   LDLDIRECT 60.0 04/08/2016 0925     Wt Readings from Last 3 Encounters:  04/14/16 190 lb (86.2 kg)  04/08/16 187 lb 12.8 oz (85.2 kg)  10/12/15 187 lb 12.8 oz (85.2 kg)     Other studies Reviewed: Additional studies/ records that were reviewed today include: . Review of the above records demonstrates:    Assessment and Plan:   1. CAD: He has no chest pain to suggest angina.  Continue beta blocker, ASA, statin, Ace-inh.   2. Ischemic Cardiomyopathy:  LVEF=46% by stress myoview 2014. He has no evidence of CHF. Continue medical therapy.   3. HTN:  Controlled. No changes today.   4. Hyperlipidemia: He is on a statin. Lipids are well controlled. Last LDL 60.  Current medicines are reviewed at length with the patient today.  The patient does not have concerns regarding medicines.  The following changes have been made:  no change  Labs/ tests ordered today include:   Orders Placed This Encounter  Procedures  . EKG 12-Lead    Disposition:   FU with me in 6 months  Signed, Lauree Chandler, MD 04/14/2016 1:05 PM    [facility] [address], [place], [place]  [address] Phone: [phone]; Fax: [phone]

## 2016-04-14 ENCOUNTER — Ambulatory Visit (INDEPENDENT_AMBULATORY_CARE_PROVIDER_SITE_OTHER): Payer: Medicare Other | Admitting: Cardiovascular Disease

## 2016-04-14 ENCOUNTER — Encounter: Payer: Self-pay | Admitting: Cardiovascular Disease

## 2016-04-14 VITALS — BP 124/62 | HR 53 | Ht 70.0 in | Wt 190.0 lb

## 2016-04-14 DIAGNOSIS — E7800 Pure hypercholesterolemia, unspecified: Secondary | ICD-10-CM

## 2016-04-14 DIAGNOSIS — I10 Essential (primary) hypertension: Secondary | ICD-10-CM | POA: Diagnosis not present

## 2016-04-14 DIAGNOSIS — I2510 Atherosclerotic heart disease of native coronary artery without angina pectoris: Secondary | ICD-10-CM

## 2016-04-14 DIAGNOSIS — I255 Ischemic cardiomyopathy: Secondary | ICD-10-CM

## 2016-04-14 NOTE — Patient Instructions (Signed)

## 2016-05-13 ENCOUNTER — Encounter: Payer: Self-pay | Admitting: Family Medicine

## 2016-05-30 ENCOUNTER — Other Ambulatory Visit: Payer: Self-pay

## 2016-08-26 ENCOUNTER — Encounter: Payer: Self-pay | Admitting: Family Medicine

## 2016-08-26 MED ORDER — DICLOFENAC SODIUM 1 % TD GEL: 2.0000 g | Freq: Four times a day (QID) | TRANSDERMAL | 3 refills | Status: DC

## 2016-08-27 ENCOUNTER — Telehealth: Payer: Self-pay

## 2016-08-27 NOTE — Telephone Encounter (Signed)
Received PA request from [facility] pharmacy for Diclofenac Sodium gel. PA submitted & is pending. Key: [id number]

## 2016-08-29 NOTE — Telephone Encounter (Signed)
PA approved, form faxed back to pharmacy. 

## 2016-09-20 ENCOUNTER — Other Ambulatory Visit: Payer: Self-pay | Admitting: Family Medicine

## 2016-09-24 ENCOUNTER — Other Ambulatory Visit: Payer: Self-pay

## 2016-09-24 MED ORDER — LEVOTHYROXINE SODIUM 50 MCG PO TABS: ORAL_TABLET | ORAL | 3 refills | Status: DC

## 2016-10-07 ENCOUNTER — Ambulatory Visit: Payer: Medicare Other | Admitting: Family Medicine

## 2016-10-09 ENCOUNTER — Encounter: Payer: Self-pay | Admitting: Family Medicine

## 2016-10-09 ENCOUNTER — Ambulatory Visit (INDEPENDENT_AMBULATORY_CARE_PROVIDER_SITE_OTHER): Payer: Medicare Other | Admitting: Family Medicine

## 2016-10-09 VITALS — BP 130/66 | HR 55 | Temp 97.5°F | Ht 70.0 in | Wt 190.2 lb

## 2016-10-09 DIAGNOSIS — D7589 Other specified diseases of blood and blood-forming organs: Secondary | ICD-10-CM | POA: Diagnosis not present

## 2016-10-09 DIAGNOSIS — E034 Atrophy of thyroid (acquired): Secondary | ICD-10-CM

## 2016-10-09 DIAGNOSIS — I255 Ischemic cardiomyopathy: Secondary | ICD-10-CM

## 2016-10-09 DIAGNOSIS — E7800 Pure hypercholesterolemia, unspecified: Secondary | ICD-10-CM

## 2016-10-09 DIAGNOSIS — I10 Essential (primary) hypertension: Secondary | ICD-10-CM | POA: Diagnosis not present

## 2016-10-09 DIAGNOSIS — F411 Generalized anxiety disorder: Secondary | ICD-10-CM | POA: Diagnosis not present

## 2016-10-09 LAB — COMPREHENSIVE METABOLIC PANEL
ALT: 22 U/L (ref 0–53)
AST: 25 U/L (ref 0–37)
Albumin: 3.9 g/dL (ref 3.5–5.2)
Alkaline Phosphatase: 46 U/L (ref 39–117)
BUN: 25 mg/dL — AB (ref 6–23)
CO2: 30 mEq/L (ref 19–32)
Calcium: 8.9 mg/dL (ref 8.4–10.5)
Chloride: 106 mEq/L (ref 96–112)
Creatinine, Ser: 1.19 mg/dL (ref 0.40–1.50)
GFR: 62.31 mL/min (ref >60.00)
GLUCOSE: 100 mg/dL — AB (ref 70–99)
POTASSIUM: 4.8 meq/L (ref 3.5–5.1)
SODIUM: 140 meq/L (ref 135–145)
TOTAL PROTEIN: 6.3 g/dL (ref 6.0–8.3)
Total Bilirubin: 0.5 mg/dL (ref 0.2–1.2)

## 2016-10-09 LAB — LIPID PANEL
CHOL/HDL RATIO: 3
Cholesterol: 108 mg/dL (ref 0–200)
HDL: 41.3 mg/dL (ref >39.00)
LDL CALC: 57 mg/dL (ref 0–99)
NonHDL: 67.15
Triglycerides: 52 mg/dL (ref 0.0–149.0)
VLDL: 10.4 mg/dL (ref 0.0–40.0)

## 2016-10-09 LAB — CBC
HEMATOCRIT: 39.1 % (ref 39.0–52.0)
Hemoglobin: 13.1 g/dL (ref 13.0–17.0)
MCHC: 33.5 g/dL (ref 30.0–36.0)
MCV: 100.4 fl — ABNORMAL HIGH (ref 78.0–100.0)
Platelets: 235 10*3/uL (ref 150.0–400.0)
RBC: 3.89 Mil/uL — ABNORMAL LOW (ref 4.22–5.81)
RDW: 13 % (ref 11.5–15.5)
WBC: 7 10*3/uL (ref 4.0–10.5)

## 2016-10-09 LAB — TSH: TSH: 2.33 u[IU]/mL (ref 0.35–4.50)

## 2016-10-09 LAB — VITAMIN B12

## 2016-10-09 NOTE — Patient Instructions (Addendum)
call to schedule follow up with cardiology  Let us know immediately if worsening swelling. New shortness of breath, chest pain, needing more pillows to sleep, waking up gasping for air, weight gain 3 lbs in a day or 5 lbs in 3 days.   Low salt diet may also help (see below)  No changes in meds today  I would also like for you to sign up for an annual wellness visit with our nurse, Manuela Schwartz, who specializes in the annual wellness exam. This is a free benefit under medicare that may help [place] find additional ways to help you. Some highlights are reviewing medications, lifestyle, and doing a dementia screen.     Low-Sodium Eating Plan Sodium, which is an element that makes up salt, helps you maintain a healthy balance of fluids in your body. Too much sodium can increase your blood pressure and cause fluid and waste to be held in your body. Your health care provider or dietitian may recommend following this plan if you have high blood pressure (hypertension), kidney disease, liver disease, or heart failure. Eating less sodium can help lower your blood pressure, reduce swelling, and protect your heart, liver, and kidneys. What are tips for following this plan? General guidelines   Most people on this plan should limit their sodium intake to 1,500-2,000 mg (milligrams) of sodium each day. Reading food labels   The Nutrition Facts label lists the amount of sodium in one serving of the food. If you eat more than one serving, you must multiply the listed amount of sodium by the number of servings.  Choose foods with less than 140 mg of sodium per serving.  Avoid foods with 300 mg of sodium or more per serving. Shopping   Look for lower-sodium products, often labeled as "low-sodium" or "no salt added."  Always check the sodium content even if foods are labeled as "unsalted" or "no salt added".  Buy fresh foods.  Avoid canned foods and premade or frozen meals.  Avoid canned, cured, or processed  meats  Buy breads that have less than 80 mg of sodium per slice. Cooking   Eat more home-cooked food and less restaurant, buffet, and fast food.  Avoid adding salt when cooking. Use salt-free seasonings or herbs instead of table salt or sea salt. Check with your health care provider or pharmacist before using salt substitutes.  Cook with plant-based oils, such as canola, sunflower, or olive oil. Meal planning   When eating at a restaurant, ask that your food be prepared with less salt or no salt, if possible.  Avoid foods that contain MSG (monosodium glutamate). MSG is sometimes added to [place] food, bouillon, and some canned foods. What foods are recommended? The items listed may not be a complete list. Talk with your dietitian about what dietary choices are best for you. Grains  Low-sodium cereals, including oats, puffed wheat and rice, and shredded wheat. Low-sodium crackers. Unsalted rice. Unsalted pasta. Low-sodium bread. Whole-grain breads and whole-grain pasta. Vegetables  Fresh or frozen vegetables. "No salt added" canned vegetables. "No salt added" tomato sauce and paste. Low-sodium or reduced-sodium tomato and vegetable juice. Fruits  Fresh, frozen, or canned fruit. Fruit juice. Meats and other protein foods  Fresh or frozen (no salt added) meat, poultry, seafood, and fish. Low-sodium canned tuna and salmon. Unsalted nuts. Dried peas, beans, and lentils without added salt. Unsalted canned beans. Eggs. Unsalted nut butters. Dairy  Milk. Soy milk. Cheese that is naturally low in sodium, such as ricotta  cheese, fresh mozzarella, or Swiss cheese Low-sodium or reduced-sodium cheese. Cream cheese. Yogurt. Fats and oils  Unsalted butter. Unsalted margarine with no trans fat. Vegetable oils such as canola or olive oils. Seasonings and other foods  Fresh and dried herbs and spices. Salt-free seasonings. Low-sodium mustard and ketchup. Sodium-free salad dressing. Sodium-free light  mayonnaise. Fresh or refrigerated horseradish. Lemon juice. Vinegar. Homemade, reduced-sodium, or low-sodium soups. Unsalted popcorn and pretzels. Low-salt or salt-free chips. What foods are not recommended? The items listed may not be a complete list. Talk with your dietitian about what dietary choices are best for you. Grains  Instant hot cereals. Bread stuffing, pancake, and biscuit mixes. Croutons. Seasoned rice or pasta mixes. Noodle soup cups. Boxed or frozen macaroni and cheese. Regular salted crackers. Self-rising flour. Vegetables  Sauerkraut, pickled vegetables, and relishes. Olives. [place] fries. Onion rings. Regular canned vegetables (not low-sodium or reduced-sodium). Regular canned tomato sauce and paste (not low-sodium or reduced-sodium). Regular tomato and vegetable juice (not low-sodium or reduced-sodium). Frozen vegetables in sauces. Meats and other protein foods  Meat or fish that is salted, canned, smoked, spiced, or pickled. Bacon, ham, sausage, hotdogs, corned beef, chipped beef, packaged lunch meats, salt pork, jerky, pickled herring, anchovies, regular canned tuna, sardines, salted nuts. Dairy  Processed cheese and cheese spreads. Cheese curds. Blue cheese. Feta cheese. String cheese. Regular cottage cheese. Buttermilk. Canned milk. Fats and oils  Salted butter. Regular margarine. Ghee. Bacon fat. Seasonings and other foods  Onion salt, garlic salt, seasoned salt, table salt, and sea salt. Canned and packaged gravies. Worcestershire sauce. Tartar sauce. Barbecue sauce. Teriyaki sauce. Soy sauce, including reduced-sodium. Steak sauce. Fish sauce. Oyster sauce. Cocktail sauce. Horseradish that you find on the shelf. Regular ketchup and mustard. Meat flavorings and tenderizers. Bouillon cubes. Hot sauce and Tabasco sauce. Premade or packaged marinades. Premade or packaged taco seasonings. Relishes. Regular salad dressings. Salsa. Potato and tortilla chips. Corn chips and puffs.  Salted popcorn and pretzels. Canned or dried soups. Pizza. Frozen entrees and pot pies. Summary  Eating less sodium can help lower your blood pressure, reduce swelling, and protect your heart, liver, and kidneys.  Most people on this plan should limit their sodium intake to 1,500-2,000 mg (milligrams) of sodium each day.  Canned, boxed, and frozen foods are high in sodium. Restaurant foods, fast foods, and pizza are also very high in sodium. You also get sodium by adding salt to food.  Try to cook at home, eat more fresh fruits and vegetables, and eat less fast food, canned, processed, or prepared foods. This information is not intended to replace advice given to you by your health care provider. Make sure you discuss any questions you have with your health care provider. Document Released: 11/29/2001 Document Revised: 06/02/2016 Document Reviewed: 06/02/2016 Elsevier Interactive Patient Education  2017 [employer].

## 2016-10-09 NOTE — Progress Notes (Signed)
Pre visit review using our clinic review tool, if applicable. No additional management support is needed unless otherwise documented below in the visit note. 

## 2016-10-09 NOTE — Assessment & Plan Note (Signed)
Has only used 2 diazepam in 6 months- to help with mind racing before sleep

## 2016-10-09 NOTE — Assessment & Plan Note (Signed)
S: Cabg 2010. Follows with Dr. Angelena Form. He is walking 3 days a week with walking, weights, elliptical. He is compliant with aspirin and statin. Last EF in 2013 was 35-40%. He has noted some very light edema in his feet. Seems to come and go- not progressive No orthopnea or PND. No unintentional weight gain.  A/P: advised patient to call to schedule follow up with cardiology. At this point would just monitor- would hold off on lasix unless progressive symptoms. He wants to discuss repeat echocardiogram with Dr. Angelena Form

## 2016-10-09 NOTE — Progress Notes (Signed)
Subjective:  Zachary Decker is a 83 y.o. year old very pleasant male patient who presents for/with See problem oriented charting ROS- no chest pain or shortness of breath, some edema that comes and goes. No weight gain. No orthopnea or pnd   Past Medical History-  Patient Active Problem List   Diagnosis Date Noted  . CAD w/ hx MI s/p CABG 09/07/2012    Priority: High  . Cardiomyopathy, ischemic 03/22/2010    Priority: High  . CKD (chronic kidney disease), stage III 04/20/2014    Priority: Medium  . Anxiety state 04/17/2014    Priority: Medium  . HYPERCHOLESTEROLEMIA 10/31/2008    Priority: Medium  . BPH (benign prostatic hyperplasia) 09/28/2007    Priority: Medium  . Hypothyroidism 03/26/2007    Priority: Medium  . Essential hypertension 03/22/2007    Priority: Medium  . ANEMIA, VITAMIN B12 DEFICIENCY 04/22/2010    Priority: Low  . Actinic keratosis 06/25/2009    Priority: Low  . BASAL CELL CARCINOMA, NOSE 02/05/2009    Priority: Low  . Localized osteoarthrosis, lower leg 12/06/2008    Priority: Low  . GERD 03/22/2007    Priority: Low  . Low back pain 04/19/2015    Medications- reviewed and updated Current Outpatient Prescriptions  Medication Sig Dispense Refill  . Acetaminophen (TYLENOL EXTRA STRENGTH PO) Take 1-2 tablets by mouth daily as needed (for back pain).    Marland Kitchen aspirin 81 MG tablet Take 81 mg by mouth daily.      . B Complex-C-Folic Acid (FOLBEE PLUS) TABS TAKE 1 TABLET BY MOUTH DAILY. 90 tablet 1  . carvedilol (COREG) 3.125 MG tablet TAKE 1 TABLET BY MOUTH 2 TIMES A DAY WITH MEAL 180 tablet 3  . diazepam (VALIUM) 5 MG tablet Take 1 tablet (5 mg total) by mouth every 8 (eight) hours as needed. For anxiety 20 tablet 0  . diclofenac sodium (VOLTAREN) 1 % GEL Apply 2 g topically 4 (four) times daily. 100 g 3  . levothyroxine (SYNTHROID, LEVOTHROID) 50 MCG tablet TAKE 1 TABLET (50 MCG TOTAL) BY MOUTH DAILY. 90 tablet 3  . lisinopril (PRINIVIL,ZESTRIL) 2.5 MG tablet  Take 1 tablet (2.5 mg total) by mouth daily. 90 tablet 3  . nitroGLYCERIN (NITROSTAT) 0.4 MG SL tablet Place 1 tablet (0.4 mg total) under the tongue every 5 (five) minutes as needed for chest pain. 25 tablet 6  . ranitidine (ZANTAC) 300 MG tablet TAKE 1 TABLET (300 MG TOTAL) BY MOUTH AT BEDTIME. 90 tablet 3  . rosuvastatin (CRESTOR) 5 MG tablet TAKE 1 TABLET BY MOUTH DAILY AT 6PM 90 tablet 3  . tamsulosin (FLOMAX) 0.4 MG CAPS capsule TAKE 1 CAPSULE (0.4 MG TOTAL) BY MOUTH DAILY. 90 capsule 3   No current facility-administered medications for this visit.     Objective: BP 130/66 (BP Location: Left Arm, Patient Position: Sitting, Cuff Size: Large)   Pulse (!) 55   Temp 97.5 F (36.4 C) (Oral)   Ht 5\' 10"  (1.778 m)   Wt 190 lb 3.2 oz (86.3 kg)   SpO2 96%   BMI 27.29 kg/m  Gen: NAD, resting comfortably CV: RRR no murmurs rubs or gallops Lungs: CTAB no crackles, wheeze, rhonchi Abdomen: soft/nontender/nondistended/normal bowel sounds. No rebound or guarding.  Ext: trace edema Skin: warm, dry Neuro: grossly normal, moves all extremities  Assessment/Plan:  Hypothyroidism S:controlled On thyroid medication-levothyroxine 50 mcg Lab Results  Component Value Date   TSH 2.32 04/08/2016  A/P: update tsh today  Hyperlipdiemia S:  well controlled on crestor 5 mg with LDL unde r70. No myalgias.  Lab Results  Component Value Date   CHOL 116 04/19/2015   HDL 40.70 04/19/2015   LDLCALC 60 04/19/2015   LDLDIRECT 60.0 04/08/2016   TRIG 74.0 04/19/2015   CHOLHDL 3 04/19/2015   A/P: doing well- fasting so we will update full panel today  Hypertension S: controlled on coreg, lisinopril 5mg .  BP Readings from Last 3 Encounters:  10/09/16 130/66  04/14/16 124/62  04/08/16 124/74  A/P:Continue current medications  History of macrocytosis without anemia- will check a b12 level as well as folate Lab Results  Component Value Date   WBC 9.2 04/08/2016   HGB 13.3 04/08/2016   HCT 39.8  04/08/2016   MCV 100.1 (H) 04/08/2016   PLT 250.0 04/08/2016   Balance issues- using a walker at this point at times- finds helpful/supportive  Cardiomyopathy, ischemic S: Cabg 2010. Follows with Dr. Angelena Form. He is walking 3 days a week with walking, weights, elliptical. He is compliant with aspirin and statin. Last EF in 2013 was 35-40%. He has noted some very light edema in his feet. Seems to come and go- not progressive No orthopnea or PND. No unintentional weight gain.  A/P: advised patient to call to schedule follow up with cardiology. At this point would just monitor- would hold off on lasix unless progressive symptoms. He wants to discuss repeat echocardiogram with Dr. Angelena Form  Anxiety state Has only used 2 diazepam in 6 months- to help with mind racing before sleep  Return in about 6 months (around 04/10/2017) for physical.  Orders Placed This Encounter  Procedures  . TSH    [place]  . CBC    [place]  . Comprehensive metabolic panel    [place]    Order Specific Question:   Has the patient fasted?    Answer:   No  . Lipid panel    [place]    Order Specific Question:   Has the patient fasted?    Answer:   No  . Vitamin B12  . Folate RBC   Return precautions advised.  Garret Reddish, MD

## 2016-10-10 LAB — FOLATE RBC: RBC FOLATE: 1045 ng/mL (ref >280)

## 2016-10-15 ENCOUNTER — Ambulatory Visit (INDEPENDENT_AMBULATORY_CARE_PROVIDER_SITE_OTHER): Payer: Medicare Other | Admitting: Cardiovascular Disease

## 2016-10-15 ENCOUNTER — Encounter: Payer: Self-pay | Admitting: Cardiovascular Disease

## 2016-10-15 VITALS — BP 122/60 | HR 52 | Ht 74.0 in | Wt 188.8 lb

## 2016-10-15 DIAGNOSIS — I2510 Atherosclerotic heart disease of native coronary artery without angina pectoris: Secondary | ICD-10-CM | POA: Diagnosis not present

## 2016-10-15 DIAGNOSIS — E7800 Pure hypercholesterolemia, unspecified: Secondary | ICD-10-CM | POA: Diagnosis not present

## 2016-10-15 DIAGNOSIS — I255 Ischemic cardiomyopathy: Secondary | ICD-10-CM | POA: Diagnosis not present

## 2016-10-15 DIAGNOSIS — I10 Essential (primary) hypertension: Secondary | ICD-10-CM | POA: Diagnosis not present

## 2016-10-15 NOTE — Patient Instructions (Signed)
Your physician recommends that you continue on your current medications as directed. Please refer to the Current Medication list given to you today.   Your physician wants you to follow-up in: 6 MONTHS WITH DR  MCALHANY You will receive a reminder letter in the mail two months in advance. If you don't receive a letter, please call our office to schedule the follow-up appointment. 

## 2016-10-15 NOTE — Progress Notes (Signed)
Chief Complaint  Patient presents with  . Follow-up    History of Present Illness: 83 yo male with history of HTN, HLD, CAD s/p 5V CABG March 2010, ischemic cardiomyopathy, GERD, BPH here today for follow up. He was admitted March 2010 with an inferior MI complicated by VF arrest multiple times and unsuccessful attempt at PCI in the setting of severe multi-vessel CAD. He then had an urgent 5 V CABG. He is known to have a cardiomyopathy with last echo 2013 showing LVEF 35-40%. Stress myoview 03/08/13 with scar but no ischemia, LVEF=46%.  He is here today for follow up. The patient denies any chest pain, dyspnea, palpitations, lower extremity edema, orthopnea, PND, dizziness, near syncope or syncope.  He continues to exercise three days per week but he is much slower now with ongoing back pain. He has severe back pain. He has been told there is nothing that can be done. 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  Primary Care Physician: Garret Reddish, MD  Past Medical History:  Diagnosis Date  . Acute myocardial infarction of inferoposterior wall, subsequent episode of care ([place])   . CAD (coronary artery disease)   . Encounter for long-term (current) use of other medications   . Family history of malignant neoplasm of gastrointestinal tract   . GERD (gastroesophageal reflux disease)   . Hypercholesterolemia   . Hypertension   . Hypertrophy of prostate with urinary obstruction and other lower urinary tract symptoms (LUTS)   . Personal history of thrombophlebitis   . PVD (peripheral vascular disease) ([place])   . Respiratory failure ([place])   . Unspecified adverse effect of unspecified drug, medicinal and biological substance   . Unspecified hypothyroidism     Past Surgical History:  Procedure Laterality Date  . CHOLECYSTECTOMY  02/26/2012   Procedure: LAPAROSCOPIC CHOLECYSTECTOMY WITH INTRAOPERATIVE CHOLANGIOGRAM;  Surgeon: Madilyn Hook, DO;  Location: [facility];  Service:  General;  Laterality: N/A;  . CORONARY ARTERY BYPASS GRAFT     x 5 on August 29, 2008  . KNEE SURGERY    . tachecostomy placement     after heart attack  . TONSILLECTOMY      Current Outpatient Prescriptions  Medication Sig Dispense Refill  . Acetaminophen (TYLENOL EXTRA STRENGTH PO) Take 1-2 tablets by mouth daily as needed (for back pain).    Marland Kitchen aspirin 81 MG tablet Take 81 mg by mouth daily.      . B Complex-C-Folic Acid (FOLBEE PLUS) TABS TAKE 1 TABLET BY MOUTH DAILY. 90 tablet 1  . carvedilol (COREG) 3.125 MG tablet TAKE 1 TABLET BY MOUTH 2 TIMES A DAY WITH MEAL 180 tablet 3  . diazepam (VALIUM) 5 MG tablet Take 1 tablet (5 mg total) by mouth every 8 (eight) hours as needed. For anxiety 20 tablet 0  . diclofenac sodium (VOLTAREN) 1 % GEL Apply 2 g topically 4 (four) times daily. 100 g 3  . levothyroxine (SYNTHROID, LEVOTHROID) 50 MCG tablet TAKE 1 TABLET (50 MCG TOTAL) BY MOUTH DAILY. 90 tablet 3  . lisinopril (PRINIVIL,ZESTRIL) 2.5 MG tablet Take 1 tablet (2.5 mg total) by mouth daily. 90 tablet 3  . nitroGLYCERIN (NITROSTAT) 0.4 MG SL tablet Place 1 tablet (0.4 mg total) under the tongue every 5 (five) minutes as needed for chest pain. 25 tablet 6  . ranitidine (ZANTAC) 300 MG tablet Take 300 mg by mouth at bedtime.  3  . rosuvastatin (CRESTOR) 5 MG tablet TAKE 1 TABLET BY MOUTH DAILY AT  6PM 90 tablet 3  . tamsulosin (FLOMAX) 0.4 MG CAPS capsule TAKE 1 CAPSULE (0.4 MG TOTAL) BY MOUTH DAILY. 90 capsule 3   No current facility-administered medications for this visit.     Allergies  Allergen Reactions  . Losartan Potassium Other (See Comments)    Not known  . Naproxen Nausea And Vomiting  . Penicillins Rash    Social History   Social History  . Marital status: Married    Spouse name: N/A  . Number of children: N/A  . Years of education: N/A   Occupational History  . Not on file.   Social History Main Topics  . Smoking status: Never Smoker  . Smokeless tobacco: Never  Used  . Alcohol use No  . Drug use: No  . Sexual activity: Not on file   Other Topics Concern  . Not on file   Social History Narrative   Married 1958. 2 kids (boy, girl). 2 grandkids (boy and girl).       Retired from [employer] in [place])      Hobbies: former Air cabin crew, tv, reading    Family History  Problem Relation Age of Onset  . Heart disease Mother   . Tuberculosis Father     Review of Systems:  As stated in the HPI and otherwise negative.   BP 122/60   Pulse (!) 52   Ht 6\' 2"  (1.88 m)   Wt 188 lb 12.8 oz (85.6 kg)   SpO2 97%   BMI 24.24 kg/m   Physical Examination: General: Well developed, well nourished, NAD  HEENT: OP clear, mucus membranes moist  SKIN: warm, dry. No rashes. Neuro: No focal deficits  Musculoskeletal: Muscle strength 5/5 all ext  Psychiatric: Mood and affect normal  Neck: No JVD, no carotid bruits, no thyromegaly, no lymphadenopathy.  Lungs:Clear bilaterally, no wheezes, rhonci, crackles Cardiovascular: Regular rate and rhythm. No murmurs, gallops or rubs. Abdomen:Soft. Bowel sounds present. Non-tender.  Extremities: No lower extremity edema. Pulses are 2 + in the bilateral DP/PT.  Echo 09/08/11:  Left ventricle: The cavity size was normal. Wall thickness was normal. Systolic function was moderately reduced. The estimated ejection fraction was in the range of 35% to 40%. There is akinesis of the inferoposterior myocardium. Doppler parameters are consistent with abnormal left ventricular relaxation (grade 1 diastolic dysfunction). - Mitral valve: Mild regurgitation. - Left atrium: The atrium was mildly dilated.  Stress myoview 03/08/13: Stress Procedure: The patient received IV Lexiscan 0.4 mg over 15-seconds. Technetium 13m Sestamibi injected at 30-seconds. This patient had sob and weak the Lexiscan injection. Quantitative spect images were obtained after a 45 minute delay.  Stress ECG: No significant change from baseline  ECG  QPS  Raw Data Images: Normal; no motion artifact; normal heart/lung ratio.  Stress Images: There is decreased uptake in the inferior wall and lateral wall  Rest Images: There is decreased uptake in the inferior wall and lateral wall.  Subtraction (SDS): There is a fixed defect that is most consistent with a previous infarction.  Transient Ischemic Dilatation (Normal <1.22): NA  Lung/Heart Ratio (Normal <0.45): 0.49  Quantitative Gated Spect Images  QGS EDV: 141 ml  QGS ESV: 76 ml  Impression  Exercise Capacity: Lexiscan with no exercise.  BP Response: Hypotensive blood pressure response.  Clinical Symptoms: There is dyspnea.  ECG Impression: No significant ST segment change suggestive of ischemia.  Comparison with Prior Nuclear Study: No images to compare  Overall Impression: Intermediate risk stress nuclear  study. There is a large fixed perfusion defect involving the apex, inferior wall and lateral wall consistent with old MI. There is no significant reversible ischemia. There is moderate LV systolic dysfunction with EF 46%.  LV Ejection Fraction: 46%. LV Wall Motion: Inferior wall severe hypokinesis.  EKG:  EKG is ordered today. The ekg ordered today demonstrates   Recent Labs: 10/09/2016: ALT 22; BUN 25; Creatinine, Ser 1.19; Hemoglobin 13.1; Platelets 235.0; Potassium 4.8; Sodium 140; TSH 2.33   Lipid Panel    Component Value Date/Time   CHOL 108 10/09/2016 1037   TRIG 52.0 10/09/2016 1037   HDL 41.30 10/09/2016 1037   CHOLHDL 3 10/09/2016 1037   VLDL 10.4 10/09/2016 1037   LDLCALC 57 10/09/2016 1037   LDLDIRECT 60.0 04/08/2016 0925     Wt Readings from Last 3 Encounters:  10/15/16 188 lb 12.8 oz (85.6 kg)  10/09/16 190 lb 3.2 oz (86.3 kg)  04/14/16 190 lb (86.2 kg)     Other studies Reviewed: Additional studies/ records that were reviewed today include: . Review of the above records demonstrates:    Assessment and Plan:   1. CAD s/p CABG without angina: He  has no chest pain to suggest angina. I will continue his beta blocker, ASA, statina and Ace-inh.    2. Ischemic Cardiomyopathy:  Last LVEF 46% by stress myoview 2014. No CHF. Continue current therapy. He does not wish to repeat an echo.  3. HTN:  BP is controlled. No changes today.   4. Hyperlipidemia: LDL under 60. Continue statin.   Current medicines are reviewed at length with the patient today.  The patient does not have concerns regarding medicines.  The following changes have been made:  no change  Labs/ tests ordered today include:   No orders of the defined types were placed in this encounter.   Disposition:   FU with me in 6 months  Signed, Lauree Chandler, MD 10/15/2016 3:28 PM    [facility] [place], [place], [place]  [address] Phone: [phone]; Fax: [phone]

## 2017-04-08 NOTE — Progress Notes (Signed)
Subjective:   Zachary Decker is a 83 y.o. male who presents for Medicare Annual/Subsequent preventive examination.  Review of Systems:  No ROS.  Medicare Wellness Visit. Additional risk factors are reflected in the social history.  Cardiac Risk Factors include: advanced age (>19men, >46 women);hypertension;dyslipidemia;male gender     Objective:    Vitals: BP 120/68   Pulse 66   Resp 16   Ht 6\' 2"  (1.88 m)   Wt 188 lb 9.6 oz (85.5 kg)   SpO2 96%   BMI 24.21 kg/m   Body mass index is 24.21 kg/m.  Tobacco History  Smoking Status  . Never Smoker  Smokeless Tobacco  . Never Used     Counseling given: Not Answered   Past Medical History:  Diagnosis Date  . Acute myocardial infarction of inferoposterior wall, subsequent episode of care ([place])   . CAD (coronary artery disease)   . Encounter for long-term (current) use of other medications   . Family history of malignant neoplasm of gastrointestinal tract   . GERD (gastroesophageal reflux disease)   . Hypercholesterolemia   . Hypertension   . Hypertrophy of prostate with urinary obstruction and other lower urinary tract symptoms (LUTS)   . Personal history of thrombophlebitis   . PVD (peripheral vascular disease) ([place])   . Respiratory failure ([place])   . Unspecified adverse effect of unspecified drug, medicinal and biological substance   . Unspecified hypothyroidism    Past Surgical History:  Procedure Laterality Date  . CHOLECYSTECTOMY  02/26/2012   Procedure: LAPAROSCOPIC CHOLECYSTECTOMY WITH INTRAOPERATIVE CHOLANGIOGRAM;  Surgeon: Madilyn Hook, DO;  Location: [facility];  Service: General;  Laterality: N/A;  . CORONARY ARTERY BYPASS GRAFT     x 5 on August 29, 2008  . KNEE SURGERY    . tachecostomy placement     after heart attack  . TONSILLECTOMY     Family History  Problem Relation Age of Onset  . Heart disease Mother   . Tuberculosis Father    History  Sexual Activity  . Sexual activity: Not on file     Outpatient Encounter Prescriptions as of 04/09/2017  Medication Sig  . Acetaminophen (TYLENOL EXTRA STRENGTH PO) Take 1-2 tablets by mouth daily as needed (for back pain).  Marland Kitchen aspirin 81 MG tablet Take 81 mg by mouth daily.    . B Complex-C-Folic Acid (FOLBEE PLUS) TABS TAKE 1 TABLET BY MOUTH DAILY.  . carvedilol (COREG) 3.125 MG tablet TAKE 1 TABLET BY MOUTH 2 TIMES A DAY WITH MEAL  . diazepam (VALIUM) 5 MG tablet Take 1 tablet (5 mg total) by mouth every 8 (eight) hours as needed. For anxiety  . diclofenac sodium (VOLTAREN) 1 % GEL Apply 2 g topically 4 (four) times daily.  Marland Kitchen levothyroxine (SYNTHROID, LEVOTHROID) 50 MCG tablet TAKE 1 TABLET (50 MCG TOTAL) BY MOUTH DAILY.  Marland Kitchen lisinopril (PRINIVIL,ZESTRIL) 2.5 MG tablet Take 1 tablet (2.5 mg total) by mouth daily.  . nitroGLYCERIN (NITROSTAT) 0.4 MG SL tablet Place 1 tablet (0.4 mg total) under the tongue every 5 (five) minutes as needed for chest pain.  . ranitidine (ZANTAC) 300 MG tablet Take 300 mg by mouth at bedtime.  . rosuvastatin (CRESTOR) 5 MG tablet TAKE 1 TABLET BY MOUTH DAILY AT 6PM  . tamsulosin (FLOMAX) 0.4 MG CAPS capsule TAKE 1 CAPSULE (0.4 MG TOTAL) BY MOUTH DAILY.   No facility-administered encounter medications on file as of 04/09/2017.     Activities of Daily Living In your present  state of health, do you have any difficulty performing the following activities: 04/09/2017  Hearing? N  Vision? N  Difficulty concentrating or making decisions? N  Walking or climbing stairs? Y  Comment Back pain, uses walker  Dressing or bathing? N  Doing errands, shopping? Y  Comment Uses walker  Preparing Food and eating ? N  Using the Toilet? N  In the past six months, have you accidently leaked urine? N  Do you have problems with loss of bowel control? N  Managing your Medications? Y  Managing your Finances? N  Housekeeping or managing your Housekeeping? N  Some recent data might be hidden    Patient Care Team: [name], MD as PCP - General (Family Medicine) Eliott, Jeralyn Ruths as Consulting Physician (Optometry) leondard, nelson as Consulting Physician (Dentistry)   Assessment:    Physical assessment deferred to PCP.  Exercise Activities and Dietary recommendations Current Exercise Habits: Structured exercise class, Type of exercise: strength training/weights;Other - see comments (Elliptical, bike), Time (Minutes): 60, Frequency (Times/Week): 3, Weekly Exercise (Minutes/Week): 180, Intensity: Moderate, Exercise limited by: None identified  Goals    . Maintain current health status      Fall Risk Fall Risk  04/09/2017 10/09/2016 05/30/2016 04/19/2015 07/29/2013  Falls in the past year? No No No No Yes  Comment - - [facility] Telephone Survey: data to providers prior to load - -  Number falls in past yr: - - - - 1  Injury with Fall? - - - - No   Depression Screen PHQ 2/9 Scores 04/09/2017 10/09/2016 04/19/2015 07/29/2013  PHQ - 2 Score 0 0 0 1    Cognitive Function Ad8 score reviewed for issues:  Issues making decisions:no  Less interest in hobbies / activities:no  Repeats questions, stories (family complaining):no  Trouble using ordinary gadgets (microwave, computer, phone):no  Forgets the month or year: no  Mismanaging finances: no  Remembering appts:no  Daily problems with thinking and/or memory:no Ad8 score is=0            Immunization History  Administered Date(s) Administered  . Influenza Split 03/28/2011, 03/25/2012  . Influenza Whole 06/23/2001, 03/26/2007, 03/24/2008, 04/04/2009, 03/13/2010  . Influenza, High Dose Seasonal PF 04/08/2016, 04/09/2017  . Influenza,inj,Quad PF,6+ Mos 03/15/2013, 03/13/2014, 03/21/2015  . Pneumococcal Conjugate-13 10/17/2014  . Pneumococcal Polysaccharide-23 06/23/2005  . Td 06/23/2005   Screening Tests Health Maintenance  Topic Date Due  . TETANUS/TDAP  04/09/2018 (Originally 06/24/2015)  . INFLUENZA VACCINE  Completed  . PNA vac Low Risk  Adult  Completed      Plan:   Follow up with PCP as directed.  I have personally reviewed and noted the following in the patient's chart:   . Medical and social history . Use of alcohol, tobacco or illicit drugs  . Current medications and supplements . Functional ability and status . Nutritional status . Physical activity . Advanced directives . List of other physicians . Vitals . Screenings to include cognitive, depression, and falls . Referrals and appointments  In addition, I have reviewed and discussed with patient certain preventive protocols, quality metrics, and best practice recommendations. A written personalized care plan for preventive services as well as general preventive health recommendations were provided to patient.     Williemae Area, RN  04/09/2017

## 2017-04-08 NOTE — Progress Notes (Signed)
PCP notes:   Health maintenance: Tdap: Will check with insurance. Flu: Received today.  Abnormal screenings: no.   Patient concerns: no.   Nurse concerns: no.   Next PCP appt: Will schedule follow up.

## 2017-04-08 NOTE — Progress Notes (Signed)
Pre visit review using our clinic review tool, if applicable. No additional management support is needed unless otherwise documented below in the visit note. 

## 2017-04-09 ENCOUNTER — Encounter: Payer: Self-pay | Admitting: Family Medicine

## 2017-04-09 ENCOUNTER — Encounter: Payer: Self-pay | Admitting: *Deleted

## 2017-04-09 ENCOUNTER — Ambulatory Visit (INDEPENDENT_AMBULATORY_CARE_PROVIDER_SITE_OTHER): Payer: Medicare Other | Admitting: *Deleted

## 2017-04-09 ENCOUNTER — Ambulatory Visit (INDEPENDENT_AMBULATORY_CARE_PROVIDER_SITE_OTHER): Payer: Medicare Other | Admitting: Family Medicine

## 2017-04-09 VITALS — BP 120/68 | HR 66 | Resp 16 | Ht 74.0 in | Wt 188.6 lb

## 2017-04-09 VITALS — BP 120/68 | HR 66 | Temp 97.6°F | Ht 74.0 in | Wt 188.4 lb

## 2017-04-09 DIAGNOSIS — Z0000 Encounter for general adult medical examination without abnormal findings: Secondary | ICD-10-CM

## 2017-04-09 DIAGNOSIS — N183 Chronic kidney disease, stage 3 unspecified: Secondary | ICD-10-CM

## 2017-04-09 DIAGNOSIS — Z23 Encounter for immunization: Secondary | ICD-10-CM

## 2017-04-09 DIAGNOSIS — I10 Essential (primary) hypertension: Secondary | ICD-10-CM

## 2017-04-09 DIAGNOSIS — E7800 Pure hypercholesterolemia, unspecified: Secondary | ICD-10-CM

## 2017-04-09 DIAGNOSIS — I255 Ischemic cardiomyopathy: Secondary | ICD-10-CM

## 2017-04-09 DIAGNOSIS — E034 Atrophy of thyroid (acquired): Secondary | ICD-10-CM

## 2017-04-09 LAB — CBC WITH DIFFERENTIAL/PLATELET
BASOS PCT: 1 % (ref 0.0–3.0)
Basophils Absolute: 0.1 10*3/uL (ref 0.0–0.1)
EOS PCT: 3.9 % (ref 0.0–5.0)
Eosinophils Absolute: 0.3 10*3/uL (ref 0.0–0.7)
HCT: 38 % — ABNORMAL LOW (ref 39.0–52.0)
HEMOGLOBIN: 12.7 g/dL — AB (ref 13.0–17.0)
LYMPHS ABS: 1.8 10*3/uL (ref 0.7–4.0)
Lymphocytes Relative: 25.8 % (ref 12.0–46.0)
MCHC: 33.4 g/dL (ref 30.0–36.0)
MCV: 101.7 fl — AB (ref 78.0–100.0)
MONO ABS: 0.8 10*3/uL (ref 0.1–1.0)
Monocytes Relative: 11.2 % (ref 3.0–12.0)
Neutro Abs: 4 10*3/uL (ref 1.4–7.7)
Neutrophils Relative %: 58.1 % (ref 43.0–77.0)
Platelets: 225 10*3/uL (ref 150.0–400.0)
RBC: 3.73 Mil/uL — ABNORMAL LOW (ref 4.22–5.81)
RDW: 12.7 % (ref 11.5–15.5)
WBC: 6.8 10*3/uL (ref 4.0–10.5)

## 2017-04-09 LAB — BASIC METABOLIC PANEL
BUN: 26 mg/dL — AB (ref 6–23)
CALCIUM: 9 mg/dL (ref 8.4–10.5)
CO2: 27 mEq/L (ref 19–32)
CREATININE: 1.27 mg/dL (ref 0.40–1.50)
Chloride: 105 mEq/L (ref 96–112)
GFR: 57.73 mL/min — ABNORMAL LOW (ref >60.00)
GLUCOSE: 101 mg/dL — AB (ref 70–99)
Potassium: 5 mEq/L (ref 3.5–5.1)
SODIUM: 138 meq/L (ref 135–145)

## 2017-04-09 LAB — TSH: TSH: 2.23 u[IU]/mL (ref 0.35–4.50)

## 2017-04-09 NOTE — Progress Notes (Signed)
Subjective:  Zachary Decker is a 83 y.o. year old very pleasant male patient who presents for/with See problem oriented charting ROS- No chest pain or shortness of breath. No headache or blurry vision. No falls. Using walker   Past Medical History-  Patient Active Problem List   Diagnosis Date Noted  . CAD w/ hx MI s/p CABG 09/07/2012    Priority: High  . Cardiomyopathy, ischemic 03/22/2010    Priority: High  . CKD (chronic kidney disease), stage III ([place]) 04/20/2014    Priority: Medium  . Anxiety state 04/17/2014    Priority: Medium  . HYPERCHOLESTEROLEMIA 10/31/2008    Priority: Medium  . BPH (benign prostatic hyperplasia) 09/28/2007    Priority: Medium  . Hypothyroidism 03/26/2007    Priority: Medium  . Essential hypertension 03/22/2007    Priority: Medium  . ANEMIA, VITAMIN B12 DEFICIENCY 04/22/2010    Priority: Low  . Actinic keratosis 06/25/2009    Priority: Low  . BASAL CELL CARCINOMA, NOSE 02/05/2009    Priority: Low  . Localized osteoarthrosis, lower leg 12/06/2008    Priority: Low  . GERD 03/22/2007    Priority: Low  . Low back pain 04/19/2015    Medications- reviewed and updated Current Outpatient Prescriptions  Medication Sig Dispense Refill  . Acetaminophen (TYLENOL EXTRA STRENGTH PO) Take 1-2 tablets by mouth daily as needed (for back pain).    Marland Kitchen aspirin 81 MG tablet Take 81 mg by mouth daily.      . B Complex-C-Folic Acid (FOLBEE PLUS) TABS TAKE 1 TABLET BY MOUTH DAILY. 90 tablet 1  . carvedilol (COREG) 3.125 MG tablet TAKE 1 TABLET BY MOUTH 2 TIMES A DAY WITH MEAL 180 tablet 3  . diazepam (VALIUM) 5 MG tablet Take 1 tablet (5 mg total) by mouth every 8 (eight) hours as needed. For anxiety 20 tablet 0  . diclofenac sodium (VOLTAREN) 1 % GEL Apply 2 g topically 4 (four) times daily. 100 g 3  . levothyroxine (SYNTHROID, LEVOTHROID) 50 MCG tablet TAKE 1 TABLET (50 MCG TOTAL) BY MOUTH DAILY. 90 tablet 3  . lisinopril (PRINIVIL,ZESTRIL) 2.5 MG tablet Take 1  tablet (2.5 mg total) by mouth daily. 90 tablet 3  . nitroGLYCERIN (NITROSTAT) 0.4 MG SL tablet Place 1 tablet (0.4 mg total) under the tongue every 5 (five) minutes as needed for chest pain. 25 tablet 6  . ranitidine (ZANTAC) 300 MG tablet Take 300 mg by mouth at bedtime.  3  . rosuvastatin (CRESTOR) 5 MG tablet TAKE 1 TABLET BY MOUTH DAILY AT 6PM 90 tablet 3  . tamsulosin (FLOMAX) 0.4 MG CAPS capsule TAKE 1 CAPSULE (0.4 MG TOTAL) BY MOUTH DAILY. 90 capsule 3   No current facility-administered medications for this visit.     Objective: BP 120/68 (BP Location: Left Arm, Patient Position: Sitting, Cuff Size: Large)   Pulse 66   Temp 97.6 F (36.4 C) (Oral)   Ht 6\' 2"  (1.88 m)   Wt 188 lb 6.4 oz (85.5 kg)   SpO2 96%   BMI 24.19 kg/m  Gen: NAD, resting comfortably CV: RRR no murmurs rubs or gallops Lungs: CTAB no crackles, wheeze, rhonchi Abdomen: soft/nontender/nondistended/normal bowel sounds. No rebound or guarding.  Ext: trace edema, a few areas of trauma/bruising on left shin Skin: warm, dry Neuro: walking with walker instead of cane  Assessment/Plan:  Macrocytosis on last labs- b12 and folate were normal   balance issues- walker helping more than cane. PT doesn't really help  Sparing diazepam -  last visit 2 used in 6 months. This time has not used- melatonin 2.5mg  has been helpful enough  Cardiomyopathy, ischemic S: advised cardiology follow up at last visit- he saw them in april. cabg 2010. On aspirin and statin. EF 2013 35-40%- not on lasix. EF 2014 on stress test was at 46% . Mild edema. No weight gain A/P: continue current medications  Essential hypertension S: controlled on coreg 3.125mg  BID and lisinopril 2.5mg .  BP Readings from Last 3 Encounters:  04/09/17 120/68  10/15/16 122/60  10/09/16 130/66  A/P: at goal of <140/90. Continue current meds  HYPERCHOLESTEROLEMIA S: well controlled on crestor 5mg . No myalgias.  Lab Results  Component Value Date   CHOL  108 10/09/2016   HDL 41.30 10/09/2016   LDLCALC 57 10/09/2016   LDLDIRECT 60.0 04/08/2016   TRIG 52.0 10/09/2016   CHOLHDL 3 10/09/2016   A/P: LDL <70- continue current meds  Hypothyroidism S: Lab Results  Component Value Date   TSH 2.33 10/09/2016   On thyroid medication-levothyroxine 50 mcg A/P: continue current rx  CKD (chronic kidney disease), stage III S: GFR has been stable A/P: last check actually just above 60. Avoid nsaids. Monitor today  Future Appointments Date Time Provider [place]  04/09/2017 11:00 AM Williemae Area, RN [facility] None   Return in about 6 months (around 10/08/2017) for physical.  Orders Placed This Encounter  Procedures  . Flu vaccine HIGH DOSE PF  . CBC with Differential/Platelet  . Basic metabolic panel    [place]  . TSH       Return precautions advised.  Garret Reddish, MD

## 2017-04-09 NOTE — Assessment & Plan Note (Signed)
S: controlled on coreg 3.125mg  BID and lisinopril 2.5mg .  BP Readings from Last 3 Encounters:  04/09/17 120/68  10/15/16 122/60  10/09/16 130/66  A/P: at goal of <140/90. Continue current meds

## 2017-04-09 NOTE — Progress Notes (Signed)
I have reviewed and agree with note, evaluation, plan.   Stephen Hunter, MD  

## 2017-04-09 NOTE — Assessment & Plan Note (Signed)
S: advised cardiology follow up at last visit- he saw them in april. cabg 2010. On aspirin and statin. EF 2013 35-40%- not on lasix. EF 2014 on stress test was at 46% . Mild edema. No weight gain A/P: continue current medications

## 2017-04-09 NOTE — Assessment & Plan Note (Signed)
S: GFR has been stable A/P: last check actually just above 60. Avoid nsaids. Monitor today

## 2017-04-09 NOTE — Assessment & Plan Note (Signed)
S: well controlled on crestor 5mg . No myalgias.  Lab Results  Component Value Date   CHOL 108 10/09/2016   HDL 41.30 10/09/2016   LDLCALC 57 10/09/2016   LDLDIRECT 60.0 04/08/2016   TRIG 52.0 10/09/2016   CHOLHDL 3 10/09/2016   A/P: LDL <70- continue current meds

## 2017-04-09 NOTE — Patient Instructions (Signed)
Zachary Decker , Thank you for taking time to come for your Medicare Wellness Visit. I appreciate your ongoing commitment to your health goals. Please review the following plan we discussed and let me know if I can assist you in the future.   These are the goals we discussed: Goals    None      This is a list of the screening recommended for you and due dates:  Health Maintenance  Topic Date Due  . Tetanus Vaccine  06/24/2015  . Flu Shot  Completed  . Pneumonia vaccines  Completed   Preventive Care for Adults  A healthy lifestyle and preventive care can promote health and wellness. Preventive health guidelines for adults include the following key practices.  . A routine yearly physical is a good way to check with your health care provider about your health and preventive screening. It is a chance to share any concerns and updates on your health and to receive a thorough exam.  . Visit your dentist for a routine exam and preventive care every 6 months. Brush your teeth twice a day and floss once a day. Good oral hygiene prevents tooth decay and gum disease.  . The frequency of eye exams is based on your age, health, family medical history, use  of contact lenses, and other factors. Follow your health care provider's recommendations for frequency of eye exams.  . Eat a healthy diet. Foods like vegetables, fruits, whole grains, low-fat dairy products, and lean protein foods contain the nutrients you need without too many calories. Decrease your intake of foods high in solid fats, added sugars, and salt. Eat the right amount of calories for you. Get information about a proper diet from your health care provider, if necessary.  . Regular physical exercise is one of the most important things you can do for your health. Most adults should get at least 150 minutes of moderate-intensity exercise (any activity that increases your heart rate and causes you to sweat) each week. In addition, most  adults need muscle-strengthening exercises on 2 or more days a week.  Silver Sneakers may be a benefit available to you. To determine eligibility, you may visit the website: www.silversneakers.com or contact program at (405) 237-8343 Mon-Fri between 8AM-8PM.   . Maintain a healthy weight. The body mass index (BMI) is a screening tool to identify possible weight problems. It provides an estimate of body fat based on height and weight. Your health care provider can find your BMI and can help you achieve or maintain a healthy weight.   For adults 20 years and older: ? A BMI below 18.5 is considered underweight. ? A BMI of 18.5 to 24.9 is normal. ? A BMI of 25 to 29.9 is considered overweight. ? A BMI of 30 and above is considered obese.   . Maintain normal blood lipids and cholesterol levels by exercising and minimizing your intake of saturated fat. Eat a balanced diet with plenty of fruit and vegetables. Blood tests for lipids and cholesterol should begin at age 87 and be repeated every 5 years. If your lipid or cholesterol levels are high, you are over 50, or you are at high risk for heart disease, you may need your cholesterol levels checked more frequently. Ongoing high lipid and cholesterol levels should be treated with medicines if diet and exercise are not working.  . If you smoke, find out from your health care provider how to quit. If you do not use tobacco, please do not  start.  . If you choose to drink alcohol, please do not consume more than 2 drinks per day. One drink is considered to be 12 ounces (355 mL) of beer, 5 ounces (148 mL) of wine, or 1.5 ounces (44 mL) of liquor.  . If you are 56-67 years old, ask your health care provider if you should take aspirin to prevent strokes.  . Use sunscreen. Apply sunscreen liberally and repeatedly throughout the day. You should seek shade when your shadow is shorter than you. Protect yourself by wearing long sleeves, pants, a wide-brimmed hat,  and sunglasses year round, whenever you are outdoors.  . Once a month, do a whole body skin exam, using a mirror to look at the skin on your back. Tell your health care provider of new moles, moles that have irregular borders, moles that are larger than a pencil eraser, or moles that have changed in shape or color.

## 2017-04-09 NOTE — Assessment & Plan Note (Signed)
S: Lab Results  Component Value Date   TSH 2.33 10/09/2016   On thyroid medication-levothyroxine 50 mcg A/P: continue current rx

## 2017-04-09 NOTE — Patient Instructions (Addendum)
No changes today  Labs after you see Zachary Decker  Physical with me next visit

## 2017-04-13 ENCOUNTER — Other Ambulatory Visit: Payer: Self-pay

## 2017-04-13 DIAGNOSIS — D539 Nutritional anemia, unspecified: Secondary | ICD-10-CM

## 2017-04-16 ENCOUNTER — Encounter: Payer: Self-pay | Admitting: Cardiovascular Disease

## 2017-04-16 ENCOUNTER — Ambulatory Visit (INDEPENDENT_AMBULATORY_CARE_PROVIDER_SITE_OTHER): Payer: Medicare Other | Admitting: Cardiovascular Disease

## 2017-04-16 VITALS — BP 110/60 | HR 60 | Ht 74.0 in | Wt 188.4 lb

## 2017-04-16 DIAGNOSIS — I2510 Atherosclerotic heart disease of native coronary artery without angina pectoris: Secondary | ICD-10-CM

## 2017-04-16 DIAGNOSIS — E7800 Pure hypercholesterolemia, unspecified: Secondary | ICD-10-CM | POA: Diagnosis not present

## 2017-04-16 DIAGNOSIS — I10 Essential (primary) hypertension: Secondary | ICD-10-CM

## 2017-04-16 DIAGNOSIS — I255 Ischemic cardiomyopathy: Secondary | ICD-10-CM | POA: Diagnosis not present

## 2017-04-16 NOTE — Patient Instructions (Signed)
Medication Instructions:  Your physician recommends that you continue on your current medications as directed. Please refer to the Current Medication list given to you today.   Labwork: none  Testing/Procedures: none  Follow-Up: Your physician recommends that you schedule a follow-up appointment in: 6 months. Please call our office in about 3 months to schedule this appointmetn    Any Other Special Instructions Will Be Listed Below (If Applicable).     If you need a refill on your cardiac medications before your next appointment, please call your pharmacy.

## 2017-04-16 NOTE — Progress Notes (Signed)
Chief Complaint  Patient presents with  . Follow-up    cad    History of Present Illness: 83 yo male with history of HTN, HLD, CAD s/p 5V CABG March 2010, ischemic cardiomyopathy, GERD, BPH here today for follow up. He was admitted March 2010 with an inferior MI complicated by VF arrest multiple times and unsuccessful attempt at PCI in the setting of severe multi-vessel CAD. He then had an urgent 5 V CABG. He is known to have a cardiomyopathy with last echo 2013 showing LVEF 35-40%. Stress myoview 03/08/13 with scar but no ischemia, LVEF=46%.  He is here today for follow up. The patient denies any chest pain, dyspnea, palpitations, lower extremity edema, orthopnea, PND, dizziness, near syncope or syncope.  He continues to exercise several times per week but is limited by severe back pain. He is a former Careers adviser.   Primary Care Physician: [name], MD  Past Medical History:  Diagnosis Date  . Acute myocardial infarction of inferoposterior wall, subsequent episode of care ([place])   . CAD (coronary artery disease)   . Encounter for long-term (current) use of other medications   . Family history of malignant neoplasm of gastrointestinal tract   . GERD (gastroesophageal reflux disease)   . Hypercholesterolemia   . Hypertension   . Hypertrophy of prostate with urinary obstruction and other lower urinary tract symptoms (LUTS)   . Personal history of thrombophlebitis   . PVD (peripheral vascular disease) ([place])   . Respiratory failure ([place])   . Unspecified adverse effect of unspecified drug, medicinal and biological substance   . Unspecified hypothyroidism     Past Surgical History:  Procedure Laterality Date  . CHOLECYSTECTOMY  02/26/2012   Procedure: LAPAROSCOPIC CHOLECYSTECTOMY WITH INTRAOPERATIVE CHOLANGIOGRAM;  Surgeon: Madilyn Hook, DO;  Location: [facility];  Service: General;  Laterality: N/A;  . CORONARY ARTERY BYPASS GRAFT     x 5 on August 29, 2008  . KNEE SURGERY    .  tachecostomy placement     after heart attack  . TONSILLECTOMY      Current Outpatient Prescriptions  Medication Sig Dispense Refill  . Acetaminophen (TYLENOL EXTRA STRENGTH PO) Take 1-2 tablets by mouth daily as needed (for back pain).    Marland Kitchen aspirin 81 MG tablet Take 81 mg by mouth daily.      . B Complex-C-Folic Acid (FOLBEE PLUS) TABS TAKE 1 TABLET BY MOUTH DAILY. 90 tablet 1  . carvedilol (COREG) 3.125 MG tablet TAKE 1 TABLET BY MOUTH 2 TIMES A DAY WITH MEAL 180 tablet 3  . diazepam (VALIUM) 5 MG tablet Take 1 tablet (5 mg total) by mouth every 8 (eight) hours as needed. For anxiety 20 tablet 0  . diclofenac sodium (VOLTAREN) 1 % GEL Apply 2 g topically 4 (four) times daily. 100 g 3  . levothyroxine (SYNTHROID, LEVOTHROID) 50 MCG tablet TAKE 1 TABLET (50 MCG TOTAL) BY MOUTH DAILY. 90 tablet 3  . lisinopril (PRINIVIL,ZESTRIL) 2.5 MG tablet Take 1 tablet (2.5 mg total) by mouth daily. 90 tablet 3  . Melatonin ([facility] MELATONIN) 5 MG TABS Take 2.5 mg by mouth at bedtime.    . nitroGLYCERIN (NITROSTAT) 0.4 MG SL tablet Place 1 tablet (0.4 mg total) under the tongue every 5 (five) minutes as needed for chest pain. 25 tablet 6  . ranitidine (ZANTAC) 300 MG tablet Take 300 mg by mouth at bedtime.  3  . rosuvastatin (CRESTOR) 5 MG tablet TAKE 1 TABLET BY MOUTH DAILY  AT 6PM 90 tablet 3  . tamsulosin (FLOMAX) 0.4 MG CAPS capsule TAKE 1 CAPSULE (0.4 MG TOTAL) BY MOUTH DAILY. 90 capsule 3   No current facility-administered medications for this visit.     Allergies  Allergen Reactions  . Losartan Potassium Other (See Comments)    Not known  . Naproxen Nausea And Vomiting  . Penicillins Rash    Social History   Social History  . Marital status: Married    Spouse name: N/A  . Number of children: N/A  . Years of education: N/A   Occupational History  . Not on file.   Social History Main Topics  . Smoking status: Never Smoker  . Smokeless tobacco: Never Used  . Alcohol use No  . Drug  use: No  . Sexual activity: Not on file   Other Topics Concern  . Not on file   Social History Narrative   Married 1958. 2 kids (boy, girl). 2 grandkids (boy and girl).       Retired from [employer] ([employer] in [place])      Hobbies: former Air cabin crew, tv, reading    Family History  Problem Relation Age of Onset  . Heart disease Mother   . Tuberculosis Father     Review of Systems:  As stated in the HPI and otherwise negative.   BP 110/60   Pulse 60   Ht 6\' 2"  (1.88 m)   Wt 188 lb 6.4 oz (85.5 kg)   SpO2 97%   BMI 24.19 kg/m   Physical Examination:  General: Well developed, well nourished, NAD  HEENT: OP clear, mucus membranes moist  SKIN: warm, dry. No rashes. Neuro: No focal deficits  Musculoskeletal: Muscle strength 5/5 all ext  Psychiatric: Mood and affect normal  Neck: No JVD, no carotid bruits, no thyromegaly, no lymphadenopathy.  Lungs:Clear bilaterally, no wheezes, rhonci, crackles Cardiovascular: Regular rate and rhythm. No murmurs, gallops or rubs. Abdomen:Soft. Bowel sounds present. Non-tender.  Extremities: No lower extremity edema. Pulses are 2 + in the bilateral DP/PT.  Echo 09/08/11:  Left ventricle: The cavity size was normal. Wall thickness was normal. Systolic function was moderately reduced. The estimated ejection fraction was in the range of 35% to 40%. There is akinesis of the inferoposterior myocardium. Doppler parameters are consistent with abnormal left ventricular relaxation (grade 1 diastolic dysfunction). - Mitral valve: Mild regurgitation. - Left atrium: The atrium was mildly dilated.  Stress myoview 03/08/13: Stress Procedure: The patient received IV Lexiscan 0.4 mg over 15-seconds. Technetium 26m Sestamibi injected at 30-seconds. This patient had sob and weak the Lexiscan injection. Quantitative spect images were obtained after a 45 minute delay.  Stress ECG: No significant change from baseline ECG  QPS  Raw Data Images: Normal;  no motion artifact; normal heart/lung ratio.  Stress Images: There is decreased uptake in the inferior wall and lateral wall  Rest Images: There is decreased uptake in the inferior wall and lateral wall.  Subtraction (SDS): There is a fixed defect that is most consistent with a previous infarction.  Transient Ischemic Dilatation (Normal <1.22): NA  Lung/Heart Ratio (Normal <0.45): 0.49  Quantitative Gated Spect Images  QGS EDV: 141 ml  QGS ESV: 76 ml  Impression  Exercise Capacity: Lexiscan with no exercise.  BP Response: Hypotensive blood pressure response.  Clinical Symptoms: There is dyspnea.  ECG Impression: No significant ST segment change suggestive of ischemia.  Comparison with Prior Nuclear Study: No images to compare  Overall Impression: Intermediate risk stress  nuclear study. There is a large fixed perfusion defect involving the apex, inferior wall and lateral wall consistent with old MI. There is no significant reversible ischemia. There is moderate LV systolic dysfunction with EF 46%.  LV Ejection Fraction: 46%. LV Wall Motion: Inferior wall severe hypokinesis.  EKG:  EKG is ordered today. The ekg ordered today demonstrates sinus brady, rate 59 bpm. 1st degree AV block. IVCD. Old inf MI. T wave inversion lateral leads.  Recent Labs: 10/09/2016: ALT 22 04/09/2017: BUN 26; Creatinine, Ser 1.27; Hemoglobin 12.7; Platelets 225.0; Potassium 5.0; Sodium 138; TSH 2.23   Lipid Panel    Component Value Date/Time   CHOL 108 10/09/2016 1037   TRIG 52.0 10/09/2016 1037   HDL 41.30 10/09/2016 1037   CHOLHDL 3 10/09/2016 1037   VLDL 10.4 10/09/2016 1037   LDLCALC 57 10/09/2016 1037   LDLDIRECT 60.0 04/08/2016 0925     Wt Readings from Last 3 Encounters:  04/16/17 188 lb 6.4 oz (85.5 kg)  04/09/17 188 lb 9.6 oz (85.5 kg)  04/09/17 188 lb 6.4 oz (85.5 kg)     Other studies Reviewed: Additional studies/ records that were reviewed today include: . Review of the above records  demonstrates:    Assessment and Plan:   1. CAD s/p CABG without angina: He has no chest pain. Will continue ASA, beta blocker and statin.     2. Ischemic Cardiomyopathy:  Last LVEF 46% by stress myoview 2014. He has no evidence of CHF. He does not wish to repeat an echo. Will continue beta blocker and Ace-inh.   3. HTN:  BP controlled. NO changes  4. Hyperlipidemia: LDL at goal. Continue statin.   Current medicines are reviewed at length with the patient today.  The patient does not have concerns regarding medicines.  The following changes have been made:  no change  Labs/ tests ordered today include:   No orders of the defined types were placed in this encounter.   Disposition:   FU with me in 6 months  Signed, Lauree Chandler, MD 04/16/2017 1:16 PM    [facility] [address], [place], [place]  [address] Phone: [phone]; Fax: [phone]

## 2017-04-20 NOTE — Addendum Note (Signed)
Addended by: Mendel Ryder on: 04/20/2017 07:52 AM   Modules accepted: Orders

## 2017-05-04 ENCOUNTER — Telehealth: Payer: Self-pay

## 2017-05-04 ENCOUNTER — Encounter: Payer: Self-pay | Admitting: Cardiovascular Disease

## 2017-05-04 NOTE — Telephone Encounter (Signed)
Received request from patient's pharmacy on behalf of patient.  Patient currently receiving rosuvastatin 5 mg daily.  It is too expensive for the patient. Seeking lower cost alternative.  Pharmacy says simvastatin is lower cost alternative.  Ok to change?  If so, will dose change?  Please advise.

## 2017-05-04 NOTE — Telephone Encounter (Signed)
What is the expense of atorvastatin 10mg ? That would be a similar dosage and his LDL has been well controlled on rosuvastatin 5mg .

## 2017-05-05 ENCOUNTER — Other Ambulatory Visit: Payer: Self-pay

## 2017-05-05 MED ORDER — SIMVASTATIN 20 MG PO TABS: 20.0000 mg | ORAL_TABLET | Freq: Every day | ORAL | 3 refills | Status: DC

## 2017-05-05 NOTE — Telephone Encounter (Signed)
Spoke with patient.  He prefers 90 day supplies.  Sent Rx to pharmacy for simvastatin 20 mg, #90 with 3 refills.

## 2017-05-05 NOTE — Telephone Encounter (Signed)
Museum/gallery conservator- great job! Thanks  We will go with the simvastatin 20mg  then- you can send in #90 or #30 per patient preference to take daily and can given enough refills for a year

## 2017-05-05 NOTE — Telephone Encounter (Signed)
[facility], atorvastatin 10 mg will cost the same as rosuvastatin 5 mg.  Cost break down is:  Rosuvastatin 5 mg:  $24.00 Atorvastatin 10 mg:  $24.00 Simvastatin 20 mg:  $8.00  Please advise.  Thank you.

## 2017-05-07 ENCOUNTER — Other Ambulatory Visit: Payer: Self-pay | Admitting: *Deleted

## 2017-05-07 MED ORDER — TAMSULOSIN HCL 0.4 MG PO CAPS: ORAL_CAPSULE | ORAL | 3 refills | Status: DC

## 2017-05-12 ENCOUNTER — Other Ambulatory Visit (INDEPENDENT_AMBULATORY_CARE_PROVIDER_SITE_OTHER): Payer: Medicare Other

## 2017-05-12 DIAGNOSIS — D539 Nutritional anemia, unspecified: Secondary | ICD-10-CM

## 2017-05-12 LAB — CBC
HCT: 39.3 % (ref 39.0–52.0)
Hemoglobin: 13.2 g/dL (ref 13.0–17.0)
MCHC: 33.6 g/dL (ref 30.0–36.0)
MCV: 102.4 fl — ABNORMAL HIGH (ref 78.0–100.0)
PLATELETS: 234 10*3/uL (ref 150.0–400.0)
RBC: 3.84 Mil/uL — AB (ref 4.22–5.81)
RDW: 12.9 % (ref 11.5–15.5)
WBC: 7.9 10*3/uL (ref 4.0–10.5)

## 2017-05-13 ENCOUNTER — Other Ambulatory Visit: Payer: Self-pay

## 2017-05-13 DIAGNOSIS — E7800 Pure hypercholesterolemia, unspecified: Secondary | ICD-10-CM

## 2017-05-13 MED ORDER — ROSUVASTATIN CALCIUM 5 MG PO TABS: ORAL_TABLET | ORAL | 3 refills | Status: DC

## 2017-05-18 ENCOUNTER — Telehealth: Payer: Self-pay | Admitting: Family Medicine

## 2017-05-18 NOTE — Telephone Encounter (Signed)
Patient's wife called in reference to medication rosuvastatin (CRESTOR) 5 MG tablet  Been changed to simvastatin (ZOCOR) 20 MG tablet. Patient's wife wants to verify MG is correct since changing medications. Please advise. OK to leave message.

## 2017-05-19 NOTE — Telephone Encounter (Signed)
Called and spoke with wife and confirmed dosage is correct

## 2017-05-21 ENCOUNTER — Telehealth: Payer: Self-pay | Admitting: Family Medicine

## 2017-05-21 NOTE — Telephone Encounter (Signed)
Copied from [place] [phone] [phone]. Topic: Quick Communication - See Telephone Encounter >> May 21, 2017  8:48 AM Burnis Medin, NT wrote: CRM for notification. See Telephone encounter for: Pt. Called and tried to getting medication refilled and was denied by doctor. Medication is B Complex-C-Folic Acid (FOLBEE PLUS) TABS. Pt would like a call back about why medication was denied. Pt also needs ranitidine (ZANTAC) 300 MG tablet. [facility] [address]  05/21/17.

## 2017-05-25 ENCOUNTER — Other Ambulatory Visit: Payer: Self-pay

## 2017-05-25 ENCOUNTER — Telehealth: Payer: Self-pay

## 2017-05-25 MED ORDER — FOLBEE PLUS PO TABS: 1.0000 | ORAL_TABLET | Freq: Every day | ORAL | 1 refills | Status: DC

## 2017-05-25 MED ORDER — RANITIDINE HCL 300 MG PO TABS: 300.0000 mg | ORAL_TABLET | Freq: Every day | ORAL | 3 refills | Status: DC

## 2017-05-25 NOTE — Telephone Encounter (Signed)
Patients wife calling states pt is out of the Zantac and needs a refill and wants to know why the Titus Mould was denied. Please advice.

## 2017-05-25 NOTE — Telephone Encounter (Signed)
Prescription was sent to pharmacy as requested.

## 2017-05-25 NOTE — Telephone Encounter (Signed)
Copied from [place] [id number]. Topic: Quick Communication - See Telephone Encounter >> May 21, 2017  8:48 AM Burnis Medin, NT wrote: CRM for notification. See Telephone encounter for: Pt. Called and tried to getting medication refilled and was denied by doctor. Medication is B Complex-C-Folic Acid (FOLBEE PLUS) TABS. Pt would like a call back about why medication was denied.   05/21/17. >> May 22, 2017 10:02 AM Carolyn Stare wrote:    Pt wife is following up on req for refills

## 2017-06-04 ENCOUNTER — Other Ambulatory Visit: Payer: Self-pay

## 2017-06-04 DIAGNOSIS — I10 Essential (primary) hypertension: Secondary | ICD-10-CM

## 2017-06-04 MED ORDER — CARVEDILOL 3.125 MG PO TABS: ORAL_TABLET | ORAL | 3 refills | Status: DC

## 2017-06-04 MED ORDER — LISINOPRIL 2.5 MG PO TABS: 2.5000 mg | ORAL_TABLET | Freq: Every day | ORAL | 3 refills | Status: DC

## 2017-06-17 ENCOUNTER — Ambulatory Visit: Payer: Self-pay | Admitting: *Deleted

## 2017-06-17 NOTE — Telephone Encounter (Signed)
   Reason for Disposition . [1] MILD diarrhea (e.g., 1-3 or more stools than normal in past 24 hours) without known cause AND [2] present >  7 days  Answer Assessment - Initial Assessment Questions Watery stools for one week now. No other symptoms.He reports he took an antibiotic, Clindamycin HCL, 150mg  yesterday-2 doses and has no stools today, yet.   1. DIARRHEA SEVERITY: "How bad is the diarrhea?" "How many extra stools have you had in the past 24 hours than normal?"    - MILD: Few loose or mushy BMs; increase of 1-3 stools over normal daily number of stools; mild increase in ostomy output.   - MODERATE: Increase of 4-6 stools daily over normal; moderate increase in ostomy output.   - SEVERE (or Worst Possible): Increase of 7 or more stools daily over normal; moderate increase in ostomy output; incontinence.     2-3 stools a day for a week now. 2. ONSET: "When did the diarrhea begin?"     1 week ago 3. BM CONSISTENCY: "How loose or watery is the diarrhea?"      Watery to mushy 4. VOMITING: "Are you also vomiting?" If so, ask: "How many times in the past 24 hours?"      no 5. ABDOMINAL PAIN: "Are you having any abdominal pain?" If yes: "What does it feel like?" (e.g., crampy, dull, intermittent, constant)      Heavy feeling 6. ABDOMINAL PAIN SEVERITY: If present, ask: "How bad is the pain?"  (e.g., Scale 1-10; mild, moderate, or severe)    - MILD (1-3): doesn't interfere with normal activities, abdomen soft and not tender to touch     - MODERATE (4-7): interferes with normal activities or awakens from sleep, tender to touch     - SEVERE (8-10): excruciating pain, doubled over, unable to do any normal activities     No pain 7. ORAL INTAKE: If vomiting, "Have you been able to drink liquids?" "How much fluids have you had in the past 24 hours?"    4-5 glasses of water everyday 8. HYDRATION: "Any signs of dehydration?" (e.g., dry mouth [not just dry lips], too weak to stand, dizziness, new  weight loss) "When did you last urinate?"    This morning 9. EXPOSURE: "Have you traveled to a foreign country recently?" "Have you been exposed to anyone with diarrhea?" "Could you have eaten any food that was spoiled?"     no 10. OTHER SYMPTOMS: "Do you have any other symptoms?" (e.g., fever, blood in stool)      no 11. PREGNANCY: "Is there any chance you are pregnant?" "When was your last menstrual period?"     na  Protocols used: [facility]

## 2017-06-18 ENCOUNTER — Ambulatory Visit: Payer: Medicare Other | Admitting: Family Medicine

## 2017-06-18 ENCOUNTER — Encounter: Payer: Self-pay | Admitting: Family Medicine

## 2017-06-18 VITALS — BP 138/72 | HR 58 | Temp 97.6°F | Ht 74.0 in | Wt 187.0 lb

## 2017-06-18 DIAGNOSIS — R197 Diarrhea, unspecified: Secondary | ICD-10-CM | POA: Diagnosis not present

## 2017-06-18 MED ORDER — METRONIDAZOLE 500 MG PO TABS: 500.0000 mg | ORAL_TABLET | Freq: Three times a day (TID) | ORAL | 0 refills | Status: DC

## 2017-06-18 MED ORDER — CIPROFLOXACIN HCL 500 MG PO TABS: 500.0000 mg | ORAL_TABLET | Freq: Two times a day (BID) | ORAL | 0 refills | Status: DC

## 2017-06-18 NOTE — Patient Instructions (Signed)
Start the flagyl and cipro.  Please bring back the stool samples.  Take care, Dr Jerline Pain

## 2017-06-18 NOTE — Progress Notes (Signed)
    Subjective:  Zachary Decker is a 83 y.o. male who presents today with a chief complaint of diarrhea.   HPI:  Diarrhea, Acute Issue Started about a week ago. Worse over the last couple of days. Episodes of loose stools are from 1-3 times daily.  No hematochezia or melena.  No fever or chills.  No abdominal pain.  No nausea or vomiting.  No hematuria.  Mild pressure in left lower quadrant.  He took some leftover clindamycin which helped with his symptoms a little bit.  Also tried Imodium which did not centrically seem to help.  Has a history of C. difficile.  No other obvious alleviating or aggravating factors.  ROS: Per HPI  PMH: Has a history of diverticulosis.  Objective:  Physical Exam: BP 138/72 (BP Location: Left Arm, Patient Position: Sitting, Cuff Size: Large)   Pulse (!) 58   Temp 97.6 F (36.4 C) (Oral)   Ht 6\' 2"  (1.88 m)   Wt 187 lb (84.8 kg)   SpO2 96%   BMI 24.01 kg/m   Gen: NAD, resting comfortably CV: RRR with no murmurs appreciated Pulm: NWOB, CTAB with no crackles, wheezes, or rhonchi GI: Normal bowel sounds present.  Soft.  Very mild tenderness to left lower quadrant.  No distention.  No rebound or guarding.  Assessment/Plan:  Diarrhea No red flag signs symptoms.  His abdominal exam is largely benign however he does have a small amount of left lower quadrant tenderness.  Given his exam findings and history of diverticulosis, we will empirically treat for mild diverticulitis flare.  Start Cipro and Flagyl for 7-day course.  Given his history of C. difficile, we will also check stool studies.  Advised against Imodium until stool studies return.  Recommended that he start probiotic and fiber supplementation.  Return precautions reviewed.  Follow-up as needed.  Algis Greenhouse. Jerline Pain, MD 06/18/2017 1:51 PM

## 2017-06-19 ENCOUNTER — Other Ambulatory Visit: Payer: Medicare Other

## 2017-06-19 DIAGNOSIS — R197 Diarrhea, unspecified: Secondary | ICD-10-CM

## 2017-06-20 LAB — C. DIFFICILE GDH AND TOXIN A/B
GDH ANTIGEN: NOT DETECTED
MICRO NUMBER:: 81459518
SPECIMEN QUALITY: ADEQUATE
TOXIN A AND B: NOT DETECTED

## 2017-06-23 LAB — STOOL CULTURE
MICRO NUMBER: 81457485
MICRO NUMBER: 81457486
MICRO NUMBER:: 81457487
SHIGA RESULT: NOT DETECTED
SPECIMEN QUALITY: ADEQUATE
SPECIMEN QUALITY: ADEQUATE
SPECIMEN QUALITY: ADEQUATE

## 2017-06-24 NOTE — Progress Notes (Signed)
Stool studies are normal. Patient should finish his antibiotics and come back for another appointment if symptoms have not resolved.  Zachary Decker. Jerline Pain, MD 06/24/2017 8:02 AM

## 2017-07-13 ENCOUNTER — Telehealth: Payer: Self-pay | Admitting: Family Medicine

## 2017-07-13 ENCOUNTER — Ambulatory Visit: Payer: Medicare Other | Admitting: Family Medicine

## 2017-07-13 ENCOUNTER — Encounter: Payer: Self-pay | Admitting: Family Medicine

## 2017-07-13 VITALS — BP 138/72 | HR 58 | Temp 97.2°F | Ht 74.0 in | Wt 180.0 lb

## 2017-07-13 DIAGNOSIS — K5792 Diverticulitis of intestine, part unspecified, without perforation or abscess without bleeding: Secondary | ICD-10-CM | POA: Diagnosis not present

## 2017-07-13 DIAGNOSIS — E7800 Pure hypercholesterolemia, unspecified: Secondary | ICD-10-CM

## 2017-07-13 MED ORDER — METRONIDAZOLE 500 MG PO TABS: 500.0000 mg | ORAL_TABLET | Freq: Three times a day (TID) | ORAL | 0 refills | Status: AC

## 2017-07-13 MED ORDER — CIPROFLOXACIN HCL 500 MG PO TABS: 500.0000 mg | ORAL_TABLET | Freq: Two times a day (BID) | ORAL | 0 refills | Status: AC

## 2017-07-13 NOTE — Assessment & Plan Note (Signed)
S: compliant with simvastatin 20mg  instead of rosuvastatin 5mg  and much less expensive.  A/P: continue current medicine- next labs update lipids

## 2017-07-13 NOTE — Patient Instructions (Addendum)
Will treat this as diverticulitis with longer course (10 days of antibiotics). If this doesn't clear but symptoms remain mild- lets try a month of probiotics. If that doesn't work would need to see you back or sooner at any point your symptoms were to worsen (particularly fever, chills, significant abdominal pain)

## 2017-07-13 NOTE — Progress Notes (Signed)
Subjective:  Zachary Decker is a 84 y.o. year old very pleasant male patient who presents for/with See problem oriented charting ROS- no fever or chills. Still having loose stools and lower abdominal pain. No vomiting. Has had low appetite. No blood in stool or melena.    Past Medical History-  Patient Active Problem List   Diagnosis Date Noted  . CAD w/ hx MI s/p CABG 09/07/2012    Priority: High  . Cardiomyopathy, ischemic 03/22/2010    Priority: High  . CKD (chronic kidney disease), stage III ([place]) 04/20/2014    Priority: Medium  . Anxiety state 04/17/2014    Priority: Medium  . HYPERCHOLESTEROLEMIA 10/31/2008    Priority: Medium  . BPH (benign prostatic hyperplasia) 09/28/2007    Priority: Medium  . Hypothyroidism 03/26/2007    Priority: Medium  . Essential hypertension 03/22/2007    Priority: Medium  . ANEMIA, VITAMIN B12 DEFICIENCY 04/22/2010    Priority: Low  . Actinic keratosis 06/25/2009    Priority: Low  . BASAL CELL CARCINOMA, NOSE 02/05/2009    Priority: Low  . Localized osteoarthrosis, lower leg 12/06/2008    Priority: Low  . GERD 03/22/2007    Priority: Low  . Low back pain 04/19/2015    Medications- reviewed and updated Current Outpatient Medications  Medication Sig Dispense Refill  . Acetaminophen (TYLENOL EXTRA STRENGTH PO) Take 1-2 tablets by mouth daily as needed (for back pain).    Marland Kitchen aspirin 81 MG tablet Take 81 mg by mouth daily.      . B Complex-C-Folic Acid (FOLBEE PLUS) TABS Take 1 tablet by mouth daily. 90 tablet 1  . carvedilol (COREG) 3.125 MG tablet TAKE 1 TABLET BY MOUTH 2 TIMES A DAY WITH MEAL 180 tablet 3  . diazepam (VALIUM) 5 MG tablet Take 1 tablet (5 mg total) by mouth every 8 (eight) hours as needed. For anxiety 20 tablet 0  . diclofenac sodium (VOLTAREN) 1 % GEL Apply 2 g topically 4 (four) times daily. 100 g 3  . levothyroxine (SYNTHROID, LEVOTHROID) 50 MCG tablet TAKE 1 TABLET (50 MCG TOTAL) BY MOUTH DAILY. 90 tablet 3  .  lisinopril (PRINIVIL,ZESTRIL) 2.5 MG tablet Take 1 tablet (2.5 mg total) by mouth daily. 90 tablet 3  . Melatonin (CVS MELATONIN) 5 MG TABS Take 2.5 mg by mouth at bedtime.    . nitroGLYCERIN (NITROSTAT) 0.4 MG SL tablet Place 1 tablet (0.4 mg total) under the tongue every 5 (five) minutes as needed for chest pain. 25 tablet 6  . ranitidine (ZANTAC) 300 MG tablet Take 1 tablet (300 mg total) by mouth at bedtime. 90 tablet 3  . simvastatin (ZOCOR) 20 MG tablet Take 1 tablet (20 mg total) at bedtime by mouth. 90 tablet 3  . tamsulosin (FLOMAX) 0.4 MG CAPS capsule TAKE 1 CAPSULE (0.4 MG TOTAL) BY MOUTH DAILY. 90 capsule 3     Objective: BP 138/72 (BP Location: Left Arm, Patient Position: Sitting, Cuff Size: Large)   Pulse (!) 58   Temp (!) 97.2 F (36.2 C) (Oral)   Ht 6\' 2"  (1.88 m)   Wt 180 lb (81.6 kg)   SpO2 97%   BMI 23.11 kg/m  Gen: NAD, resting comfortably CV: RRR no murmurs rubs or gallops Lungs: CTAB no crackles, wheeze, rhonchi Abdomen: keeps lower abdomen somewhat rigid- mild pain in LLQ but cannot get patient to fully relax to get deeper exam/ otherwise nontender/nondistended/normal bowel sounds. No rebound or guarding.  Ext: 1+ edema Skin: warm,  dry Neuro: walks hunched over with walker  Assessment/Plan:   Lower abdominal pain with loose more frequent bowel movements- possible diverticulitis S:  Patient was seen 12/217/18 for a week of diarrhea. At that time was having 1-3 bowel movements a day without blood in she stool. He had taken some home clindamycin which helped some. Imodium didn't help. History of c. Difficile and tested that day (negative). Patient also has a history of diverticulitis and was started on cipro/flagyl. Also had fiber supplement and probiotic recommended. Stool cultures were negative. C. Diff was negative.   He took the antibiotics for 7 days and felt like he was out of the woods except stools remained soft. Within a few days symptoms worsened but  were not as bad as they were overall. Appetite remains poor. Urgency of bowel movements whe he moves, ok with sitting- making it hard for him to get out of the home. Sleeps ok but when wakes up doesn't feel well. After bowel movements- feels better. Lower abdominal pain came back in addition to diarrhea- bowel movements back to 1-3 with 2 for the most part. He also is having some suprapubic pain and LLQ pain which feels better after bowel movements but still persists. No hard stools. No urinary symptoms.  A/P: I think diverticulitis is in differential but seems symptoms are somewhat Mild. I advised trial probiotics for one month. Patient declines this option- he feels he had such relief on antibiotics he wants a longer trial- will use 10 days but if not improved would do 1 month of probiotics. Could also consider cbc, cmp and potential abdominal imaging at future visit.   HYPERCHOLESTEROLEMIA S: compliant with simvastatin 20mg  instead of rosuvastatin 5mg  and much less expensive.  A/P: continue current medicine- next labs update lipids   Future Appointments  Date Time Provider [place]  10/08/2017 10:00 AM [name], MD [facility]  04/13/2018 10:00 AM Williemae Area, RN [facility]  04/13/2018 11:00 AM Yong Channel Brayton Mars, MD [facility]   Meds ordered this encounter  Medications  . ciprofloxacin (CIPRO) 500 MG tablet    Sig: Take 1 tablet (500 mg total) by mouth 2 (two) times daily for 10 days.    Dispense:  20 tablet    Refill:  0  . metroNIDAZOLE (FLAGYL) 500 MG tablet    Sig: Take 1 tablet (500 mg total) by mouth 3 (three) times daily for 10 days.    Dispense:  30 tablet    Refill:  0   The duration of face-to-face time during this visit was greater than 30 minutes. Greater than 50% of this time was spent in counseling, explanation of diagnosis, planning of further management, and/or coordination of care including discussion of potential causes and potential  treatments, counseling patient on dealing with these issues for almost a month now, answering questions and trying to help between differences of opinion between patient and wife (wife really wanted trial probiotics after I discussed risks of antibiotics particularly ciprofloxacin). .    Return precautions advised.  Garret Reddish, MD

## 2017-08-24 ENCOUNTER — Encounter: Payer: Self-pay | Admitting: Family Medicine

## 2017-08-28 NOTE — Telephone Encounter (Signed)
No further notes

## 2017-10-08 ENCOUNTER — Ambulatory Visit (INDEPENDENT_AMBULATORY_CARE_PROVIDER_SITE_OTHER): Payer: Medicare Other | Admitting: Family Medicine

## 2017-10-08 ENCOUNTER — Encounter: Payer: Self-pay | Admitting: Family Medicine

## 2017-10-08 VITALS — BP 110/84 | HR 52 | Temp 97.4°F | Ht 74.0 in | Wt 182.0 lb

## 2017-10-08 DIAGNOSIS — N183 Chronic kidney disease, stage 3 unspecified: Secondary | ICD-10-CM

## 2017-10-08 DIAGNOSIS — I255 Ischemic cardiomyopathy: Secondary | ICD-10-CM | POA: Diagnosis not present

## 2017-10-08 DIAGNOSIS — I2510 Atherosclerotic heart disease of native coronary artery without angina pectoris: Secondary | ICD-10-CM | POA: Diagnosis not present

## 2017-10-08 DIAGNOSIS — E039 Hypothyroidism, unspecified: Secondary | ICD-10-CM | POA: Diagnosis not present

## 2017-10-08 DIAGNOSIS — E7800 Pure hypercholesterolemia, unspecified: Secondary | ICD-10-CM | POA: Diagnosis not present

## 2017-10-08 DIAGNOSIS — Z0000 Encounter for general adult medical examination without abnormal findings: Secondary | ICD-10-CM | POA: Diagnosis not present

## 2017-10-08 DIAGNOSIS — I10 Essential (primary) hypertension: Secondary | ICD-10-CM | POA: Diagnosis not present

## 2017-10-08 LAB — COMPREHENSIVE METABOLIC PANEL
ALT: 22 U/L (ref 0–53)
AST: 26 U/L (ref 0–37)
Albumin: 3.8 g/dL (ref 3.5–5.2)
Alkaline Phosphatase: 40 U/L (ref 39–117)
BILIRUBIN TOTAL: 0.7 mg/dL (ref 0.2–1.2)
BUN: 22 mg/dL (ref 6–23)
CALCIUM: 8.9 mg/dL (ref 8.4–10.5)
CO2: 28 meq/L (ref 19–32)
Chloride: 104 mEq/L (ref 96–112)
Creatinine, Ser: 1.19 mg/dL (ref 0.40–1.50)
GFR: 62.16 mL/min (ref >60.00)
GLUCOSE: 98 mg/dL (ref 70–99)
Potassium: 4.6 mEq/L (ref 3.5–5.1)
Sodium: 137 mEq/L (ref 135–145)
TOTAL PROTEIN: 6.3 g/dL (ref 6.0–8.3)

## 2017-10-08 LAB — LIPID PANEL
CHOL/HDL RATIO: 3
Cholesterol: 114 mg/dL (ref 0–200)
HDL: 43.4 mg/dL (ref >39.00)
LDL Cholesterol: 60 mg/dL (ref 0–99)
NONHDL: 70.44
Triglycerides: 54 mg/dL (ref 0.0–149.0)
VLDL: 10.8 mg/dL (ref 0.0–40.0)

## 2017-10-08 LAB — CBC
HCT: 37.4 % — ABNORMAL LOW (ref 39.0–52.0)
HEMOGLOBIN: 12.7 g/dL — AB (ref 13.0–17.0)
MCHC: 34 g/dL (ref 30.0–36.0)
MCV: 101.5 fl — AB (ref 78.0–100.0)
PLATELETS: 240 10*3/uL (ref 150.0–400.0)
RBC: 3.69 Mil/uL — AB (ref 4.22–5.81)
RDW: 12.6 % (ref 11.5–15.5)
WBC: 7.2 10*3/uL (ref 4.0–10.5)

## 2017-10-08 LAB — TSH: TSH: 2.29 u[IU]/mL (ref 0.35–4.50)

## 2017-10-08 NOTE — Patient Instructions (Addendum)
If you can find shingrix, please get it and let us know.   No changes today  Please stop by lab before you go

## 2017-10-08 NOTE — Progress Notes (Signed)
Phone: [phone]  Subjective:  Patient presents today for their annual physical. Chief complaint-noted.   See problem oriented charting- ROS- full  review of systems was completed and negative except for: low back pain, some fatigue   The following were reviewed and entered/updated in epic: Past Medical History:  Diagnosis Date  . Acute myocardial infarction of inferoposterior wall, subsequent episode of care ([place])   . CAD (coronary artery disease)   . Encounter for long-term (current) use of other medications   . Family history of malignant neoplasm of gastrointestinal tract   . GERD (gastroesophageal reflux disease)   . Hypercholesterolemia   . Hypertension   . Hypertrophy of prostate with urinary obstruction and other lower urinary tract symptoms (LUTS)   . Personal history of thrombophlebitis   . PVD (peripheral vascular disease) ([place])   . Respiratory failure ([place])   . Unspecified adverse effect of unspecified drug, medicinal and biological substance   . Unspecified hypothyroidism    Patient Active Problem List   Diagnosis Date Noted  . CAD w/ hx MI s/p CABG 09/07/2012    Priority: High  . Cardiomyopathy, ischemic 03/22/2010    Priority: High  . Low back pain 04/19/2015    Priority: Medium  . CKD (chronic kidney disease), stage III ([place]) 04/20/2014    Priority: Medium  . Anxiety state 04/17/2014    Priority: Medium  . HYPERCHOLESTEROLEMIA 10/31/2008    Priority: Medium  . BPH (benign prostatic hyperplasia) 09/28/2007    Priority: Medium  . Hypothyroidism 03/26/2007    Priority: Medium  . Essential hypertension 03/22/2007    Priority: Medium  . ANEMIA, VITAMIN B12 DEFICIENCY 04/22/2010    Priority: Low  . Actinic keratosis 06/25/2009    Priority: Low  . BASAL CELL CARCINOMA, NOSE 02/05/2009    Priority: Low  . Localized osteoarthrosis, lower leg 12/06/2008    Priority: Low  . GERD 03/22/2007    Priority: Low   Past Surgical History:  Procedure  Laterality Date  . CHOLECYSTECTOMY  02/26/2012   Procedure: LAPAROSCOPIC CHOLECYSTECTOMY WITH INTRAOPERATIVE CHOLANGIOGRAM;  Surgeon: Madilyn Hook, DO;  Location: [facility];  Service: General;  Laterality: N/A;  . CORONARY ARTERY BYPASS GRAFT     x 5 on August 29, 2008  . KNEE SURGERY    . tachecostomy placement     after heart attack  . TONSILLECTOMY      Family History  Problem Relation Age of Onset  . Heart disease Mother   . Tuberculosis Father     Medications- reviewed and updated Current Outpatient Medications  Medication Sig Dispense Refill  . Acetaminophen (TYLENOL EXTRA STRENGTH PO) Take 1-2 tablets by mouth daily as needed (for back pain).    Marland Kitchen aspirin 81 MG tablet Take 81 mg by mouth daily.      . B Complex-C-Folic Acid (FOLBEE PLUS) TABS Take 1 tablet by mouth daily. 90 tablet 1  . carvedilol (COREG) 3.125 MG tablet TAKE 1 TABLET BY MOUTH 2 TIMES A DAY WITH MEAL 180 tablet 3  . levothyroxine (SYNTHROID, LEVOTHROID) 50 MCG tablet TAKE 1 TABLET (50 MCG TOTAL) BY MOUTH DAILY. 90 tablet 3  . lisinopril (PRINIVIL,ZESTRIL) 2.5 MG tablet Take 1 tablet (2.5 mg total) by mouth daily. 90 tablet 3  . Melatonin (CVS MELATONIN) 5 MG TABS Take 2.5 mg by mouth at bedtime.    . nitroGLYCERIN (NITROSTAT) 0.4 MG SL tablet Place 1 tablet (0.4 mg total) under the tongue every 5 (five) minutes as needed for chest  pain. 25 tablet 6  . ranitidine (ZANTAC) 300 MG tablet Take 1 tablet (300 mg total) by mouth at bedtime. 90 tablet 3  . simvastatin (ZOCOR) 20 MG tablet Take 1 tablet (20 mg total) at bedtime by mouth. 90 tablet 3  . tamsulosin (FLOMAX) 0.4 MG CAPS capsule TAKE 1 CAPSULE (0.4 MG TOTAL) BY MOUTH DAILY. 90 capsule 3   No current facility-administered medications for this visit.     Allergies-reviewed and updated Allergies  Allergen Reactions  . Losartan Potassium Other (See Comments)    Not known  . Naproxen Nausea And Vomiting  . Penicillins Rash    Social History   Social  History Narrative   Married 1958. 2 kids (boy, girl). 2 grandkids (boy and girl).       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 Oropharynx normal Neck: no thyromegaly CV: RRR no murmurs rubs or gallops Lungs: CTAB no crackles, wheeze, rhonchi Abdomen: soft/nontender/nondistended/normal bowel sounds.  Ext: no edema Skin: warm, dry Neuro: grossly normal, moves all extremities, PERRLA  Assessment/Plan:  84 y.o. male presenting for annual physical.  Health Maintenance counseling: 1. Anticipatory guidance: Patient counseled regarding regular dental exams -q6 months, eye exams -yearly, wearing seatbelts.  2. Risk factor reduction:  Advised patient of need for regular exercise and diet rich and fruits and vegetables to reduce risk of heart attack and stroke. Exercise- 3 days a week- steady. Diet-reasonably balanced diet.  Wt Readings from Last 3 Encounters:  10/08/17 182 lb (82.6 kg)  07/13/17 180 lb (81.6 kg)  06/18/17 187 lb (84.8 kg)  3. Immunizations/screenings/ancillary studies- discussed getting shingrix  Immunization History  Administered Date(s) Administered  . Influenza Split 03/28/2011, 03/25/2012  . Influenza Whole 06/23/2001, 03/26/2007, 03/24/2008, 04/04/2009, 03/13/2010  . Influenza, High Dose Seasonal PF 04/08/2016, 04/09/2017  . Influenza,inj,Quad PF,6+ Mos 03/15/2013, 03/13/2014, 03/21/2015  . Pneumococcal Conjugate-13 10/17/2014  . Pneumococcal Polysaccharide-23 06/23/2005  . Td 06/23/2005  4. Prostate cancer screening- passed age based screening . On flomax for BP  5. Colon cancer screening - passed age based  screening. States he had one 10 years. No polyps he reports- he does not want to pursue repeat.  6. Skin cancer screening- no dermatologist. advised regular sunscreen use. Denies worrisome, changing, or new skin lesions.    Status of chronic or acute concerns   Diverticulitis- had diarrhea for several weeks- improved with final round of antibiotic.   Macrocytosis but with normal b12 and folate in past.   Balance issues- using walker for most part. Not better with PT.   Sleep issues- very sparing diazepam in past- we will stop with fall risk. Melatonin 2.5mg  helpful  Cardiomyopathy with last EF 35-40% in 2013- he is not on lasix- follows with cardiology. Cabg 2010 on asa and statin. Given history lower EF- coreg 3.125mg  BID and lisinopril 2.5mg   HTN- on coreg 3.125mg  BID and lisinopril 2.5mg - controlled but mainly for cardiomyopathy  HLD- on crestor 5mg  in past- had to go onto simvastastin. LDL at goal under 70. Update today  hypothyroidism update  tsh on levothyroine 50 mcg  CKD III- stable GFR on recent labs - actually last check was just above 60  Future Appointments  Date Time Provider [place]  11/09/2017  2:00 PM Burnell Blanks, MD [facility]  04/13/2018 10:00 AM Williemae Area, RN [facility]  04/13/2018 11:00 AM Yong Channel Brayton Mars, MD [facility]   6 months follow up planned  Lab/Order associations: Preventative health care - Plan: CBC, Comprehensive metabolic panel, Lipid panel, TSH  HYPERCHOLESTEROLEMIA - Plan: CBC, Comprehensive metabolic panel, Lipid panel, TSH  Hypothyroidism, unspecified type - Plan: TSH  Return precautions advised.  Garret Reddish, MD

## 2017-10-09 ENCOUNTER — Encounter: Payer: Self-pay | Admitting: Family Medicine

## 2017-10-09 NOTE — Progress Notes (Signed)
Your CBC once again shows very mild anemia.  If this worsens would consider further workup.  We already checked you for vitamin deficiency last year that could cause this and none was found.  We will continue to monitor Your CMET was normal (kidney, liver, and electrolytes, blood sugar).  Your kidneys were stable with some mild age related changes.  Your cholesterol looks great Your thyroid was normal.

## 2017-10-20 ENCOUNTER — Encounter: Payer: Self-pay | Admitting: Cardiovascular Disease

## 2017-11-09 ENCOUNTER — Other Ambulatory Visit: Payer: Self-pay

## 2017-11-09 ENCOUNTER — Encounter: Payer: Self-pay | Admitting: Cardiovascular Disease

## 2017-11-09 ENCOUNTER — Telehealth: Payer: Self-pay | Admitting: Family Medicine

## 2017-11-09 ENCOUNTER — Ambulatory Visit (INDEPENDENT_AMBULATORY_CARE_PROVIDER_SITE_OTHER): Payer: Medicare Other | Admitting: Cardiovascular Disease

## 2017-11-09 ENCOUNTER — Encounter

## 2017-11-09 VITALS — BP 122/64 | HR 62 | Ht 74.0 in | Wt 180.4 lb

## 2017-11-09 DIAGNOSIS — E7800 Pure hypercholesterolemia, unspecified: Secondary | ICD-10-CM

## 2017-11-09 DIAGNOSIS — I10 Essential (primary) hypertension: Secondary | ICD-10-CM

## 2017-11-09 DIAGNOSIS — I255 Ischemic cardiomyopathy: Secondary | ICD-10-CM | POA: Diagnosis not present

## 2017-11-09 DIAGNOSIS — I2510 Atherosclerotic heart disease of native coronary artery without angina pectoris: Secondary | ICD-10-CM | POA: Diagnosis not present

## 2017-11-09 MED ORDER — DICLOFENAC SODIUM 1 % TD GEL: 2.0000 g | Freq: Four times a day (QID) | TRANSDERMAL | 0 refills | Status: DC

## 2017-11-09 NOTE — Patient Instructions (Signed)

## 2017-11-09 NOTE — Telephone Encounter (Signed)
See note

## 2017-11-09 NOTE — Telephone Encounter (Signed)
Copied from [place] [id number]. Topic: Quick Communication - See Telephone Encounter >> Nov 09, 2017 10:06 AM Cleaster Corin, NT wrote: CRM for notification. See Telephone encounter for: 11/09/17.  Pt. Wife calling to request diclofenac gel 1% which has been discontinued.   [id number] [place], [place] [address] Phone: [phone] Fax: [phone]

## 2017-11-09 NOTE — Progress Notes (Signed)
Chief Complaint  Patient presents with  . Follow-up    CAD    History of Present Illness: 84 yo male with history of HTN, HLD, CAD s/p 5V CABG March 2010, ischemic cardiomyopathy, GERD, BPH here today for follow up. He was admitted March 2010 with an inferior MI complicated by VF arrest multiple times and unsuccessful attempt at PCI in the setting of severe multi-vessel CAD. He then had an urgent 5 V CABG. He is known to have a cardiomyopathy with last echo 2013 showing LVEF 35-40%. Stress myoview 03/08/13 with scar but no ischemia, LVEF=46%.  He is here today for follow up. The patient denies any chest pain, dyspnea, palpitations, lower extremity edema, orthopnea, PND, dizziness, near syncope or syncope. He is walking at the gym three days per week.   Primary Care Physician: [name], MD  Past Medical History:  Diagnosis Date  . Acute myocardial infarction of inferoposterior wall, subsequent episode of care ([place])   . CAD (coronary artery disease)   . Encounter for long-term (current) use of other medications   . Family history of malignant neoplasm of gastrointestinal tract   . GERD (gastroesophageal reflux disease)   . Hypercholesterolemia   . Hypertension   . Hypertrophy of prostate with urinary obstruction and other lower urinary tract symptoms (LUTS)   . Personal history of thrombophlebitis   . PVD (peripheral vascular disease) ([place])   . Respiratory failure ([place])   . Unspecified adverse effect of unspecified drug, medicinal and biological substance   . Unspecified hypothyroidism     Past Surgical History:  Procedure Laterality Date  . CHOLECYSTECTOMY  02/26/2012   Procedure: LAPAROSCOPIC CHOLECYSTECTOMY WITH INTRAOPERATIVE CHOLANGIOGRAM;  Surgeon: Madilyn Hook, DO;  Location: [facility];  Service: General;  Laterality: N/A;  . CORONARY ARTERY BYPASS GRAFT     x 5 on August 29, 2008  . KNEE SURGERY    . tachecostomy placement     after heart attack  . TONSILLECTOMY       Current Outpatient Medications  Medication Sig Dispense Refill  . Acetaminophen (TYLENOL EXTRA STRENGTH PO) Take 1-2 tablets by mouth daily as needed (for back pain).    Marland Kitchen aspirin 81 MG tablet Take 81 mg by mouth daily.      . B Complex-C-Folic Acid (FOLBEE PLUS) TABS Take 1 tablet by mouth daily. 90 tablet 1  . carvedilol (COREG) 3.125 MG tablet TAKE 1 TABLET BY MOUTH 2 TIMES A DAY WITH MEAL 180 tablet 3  . diclofenac sodium (VOLTAREN) 1 % GEL Apply 2 g topically 4 (four) times daily. 1 Tube 0  . levothyroxine (SYNTHROID, LEVOTHROID) 50 MCG tablet TAKE 1 TABLET (50 MCG TOTAL) BY MOUTH DAILY. 90 tablet 3  . lisinopril (PRINIVIL,ZESTRIL) 2.5 MG tablet Take 1 tablet (2.5 mg total) by mouth daily. 90 tablet 3  . Melatonin (CVS MELATONIN) 5 MG TABS Take 2.5 mg by mouth at bedtime.    . nitroGLYCERIN (NITROSTAT) 0.4 MG SL tablet Place 1 tablet (0.4 mg total) under the tongue every 5 (five) minutes as needed for chest pain. 25 tablet 6  . ranitidine (ZANTAC) 300 MG tablet Take 1 tablet (300 mg total) by mouth at bedtime. 90 tablet 3  . simvastatin (ZOCOR) 20 MG tablet Take 1 tablet (20 mg total) at bedtime by mouth. 90 tablet 3  . tamsulosin (FLOMAX) 0.4 MG CAPS capsule TAKE 1 CAPSULE (0.4 MG TOTAL) BY MOUTH DAILY. 90 capsule 3   No current facility-administered medications for  this visit.     Allergies  Allergen Reactions  . Losartan Potassium Other (See Comments)    Not known  . Naproxen Nausea And Vomiting  . Penicillins Rash    Social History   Socioeconomic History  . Marital status: Married    Spouse name: Not on file  . Number of children: Not on file  . Years of education: Not on file  . Highest education level: Not on file  Occupational History  . Not on file  Social Needs  . Financial resource strain: Not on file  . Food insecurity:    Worry: Not on file    Inability: Not on file  . Transportation needs:    Medical: Not on file    Non-medical: Not on file   Tobacco Use  . Smoking status: Never Smoker  . Smokeless tobacco: Never Used  Substance and Sexual Activity  . Alcohol use: No  . Drug use: No  . Sexual activity: Not on file  Lifestyle  . Physical activity:    Days per week: Not on file    Minutes per session: Not on file  . Stress: Not on file  Relationships  . Social connections:    Talks on phone: Not on file    Gets together: Not on file    Attends religious service: Not on file    Active member of club or organization: Not on file    Attends meetings of clubs or organizations: Not on file    Relationship status: Not on file  . Intimate partner violence:    Fear of current or ex partner: Not on file    Emotionally abused: Not on file    Physically abused: Not on file    Forced sexual activity: Not on file  Other Topics Concern  . Not on file  Social History Narrative   Married 1958. 2 kids (boy, girl). 2 grandkids (boy and girl).       Retired from [employer] ([employer] in [place])      Hobbies: former Air cabin crew, tv, reading    Family History  Problem Relation Age of Onset  . Heart disease Mother   . Tuberculosis Father     Review of Systems:  As stated in the HPI and otherwise negative.   BP 122/64   Pulse 62   Ht 6\' 2"  (1.88 m)   Wt 180 lb 6.4 oz (81.8 kg)   SpO2 98%   BMI 23.16 kg/m   Physical Examination:  General: Well developed, well nourished, NAD  HEENT: OP clear, mucus membranes moist  SKIN: warm, dry. No rashes. Neuro: No focal deficits  Musculoskeletal: Muscle strength 5/5 all ext  Psychiatric: Mood and affect normal  Neck: No JVD, no carotid bruits, no thyromegaly, no lymphadenopathy.  Lungs:Clear bilaterally, no wheezes, rhonci, crackles Cardiovascular: Regular rate and rhythm. No murmurs, gallops or rubs. Abdomen:Soft. Bowel sounds present. Non-tender.  Extremities: No lower extremity edema. Pulses are 2 + in the bilateral DP/PT.  Echo 09/08/11:  Left ventricle: The cavity size  was normal. Wall thickness was normal. Systolic function was moderately reduced. The estimated ejection fraction was in the range of 35% to 40%. There is akinesis of the inferoposterior myocardium. Doppler parameters are consistent with abnormal left ventricular relaxation (grade 1 diastolic dysfunction). - Mitral valve: Mild regurgitation. - Left atrium: The atrium was mildly dilated.  EKG:  EKG is not ordered today. The ekg ordered today demonstrates   Recent Labs: 10/08/2017: ALT  22; BUN 22; Creatinine, Ser 1.19; Hemoglobin 12.7; Platelets 240.0; Potassium 4.6; Sodium 137; TSH 2.29   Lipid Panel    Component Value Date/Time   CHOL 114 10/08/2017 1048   TRIG 54.0 10/08/2017 1048   HDL 43.40 10/08/2017 1048   CHOLHDL 3 10/08/2017 1048   VLDL 10.8 10/08/2017 1048   LDLCALC 60 10/08/2017 1048   LDLDIRECT 60.0 04/08/2016 0925     Wt Readings from Last 3 Encounters:  11/09/17 180 lb 6.4 oz (81.8 kg)  10/08/17 182 lb (82.6 kg)  07/13/17 180 lb (81.6 kg)     Other studies Reviewed: Additional studies/ records that were reviewed today include: . Review of the above records demonstrates:    Assessment and Plan:   1. CAD s/p CABG without angina: No chest pain. Will continue ASA, beta blocker and statin.      2. Ischemic Cardiomyopathy:  Last LVEF 46% by stress myoview 2014. No findings of CHF. He does not want to repeat an echo. Continue beta blocker and Ace-inh.    3. HTN: BP is controlled. No changes.   4. Hyperlipidemia: Lipids well controlled. Continue statin.   Current medicines are reviewed at length with the patient today.  The patient does not have concerns regarding medicines.  The following changes have been made:  no change  Labs/ tests ordered today include:   No orders of the defined types were placed in this encounter.   Disposition:   FU with me in 12 months  Signed, Lauree Chandler, MD 11/09/2017 2:08 PM    [facility] [address], [place], [place]  [address] Phone: [phone]; Fax: [phone]

## 2017-11-09 NOTE — Telephone Encounter (Signed)
Prescription sent in as requested 

## 2017-11-17 ENCOUNTER — Telehealth: Payer: Self-pay | Admitting: Radiology

## 2017-11-17 ENCOUNTER — Telehealth: Payer: Self-pay | Admitting: Family Medicine

## 2017-11-17 NOTE — Telephone Encounter (Signed)
Please advise 

## 2017-11-17 NOTE — Telephone Encounter (Signed)
Copied from [place] [phone]. Topic: Inquiry >> Nov 17, 2017  3:06 PM Pricilla Handler wrote: Reason for CRM: Patient's wife called requesting a refill of B Complex-C-Folic Acid (FOLBEE PLUS) TABS for the patient. [id number] [place] [place] [facility] [place] [phone] (Phone)     [phone] (Fax).       Thank You!!!

## 2017-11-17 NOTE — Telephone Encounter (Signed)
Copied from [place] [id number]. Topic: Inquiry >> Nov 17, 2017  3:06 PM Pricilla Handler wrote: Reason for CRM: Patient's wife called requesting a refill of B Complex-C-Folic Acid (FOLBEE PLUS) TABS for the patient. [facility]'s preferred pharmacy [id number] [place], [place] - [place] [phone] (Phone)     [phone] (Fax).       Thank You!!!

## 2017-11-18 ENCOUNTER — Other Ambulatory Visit: Payer: Self-pay

## 2017-11-18 MED ORDER — FOLBEE PLUS PO TABS: 1.0000 | ORAL_TABLET | Freq: Every day | ORAL | 1 refills | Status: DC

## 2017-11-18 NOTE — Telephone Encounter (Signed)
This has been completed by Dr. Ansel Bong nurse; refill 11/18/17; # 90; RF x 1

## 2017-11-18 NOTE — Telephone Encounter (Signed)
Prescription sent in as requested 

## 2017-11-20 ENCOUNTER — Other Ambulatory Visit: Payer: Self-pay | Admitting: Family Medicine

## 2018-01-08 ENCOUNTER — Other Ambulatory Visit: Payer: Self-pay | Admitting: Family Medicine

## 2018-02-08 ENCOUNTER — Other Ambulatory Visit: Payer: Self-pay | Admitting: Family Medicine

## 2018-03-12 ENCOUNTER — Other Ambulatory Visit: Payer: Self-pay | Admitting: Family Medicine

## 2018-04-05 ENCOUNTER — Other Ambulatory Visit: Payer: Self-pay | Admitting: Family Medicine

## 2018-04-13 ENCOUNTER — Ambulatory Visit: Payer: Medicare Other | Admitting: Family Medicine

## 2018-04-13 ENCOUNTER — Ambulatory Visit (INDEPENDENT_AMBULATORY_CARE_PROVIDER_SITE_OTHER): Payer: Medicare Other | Admitting: *Deleted

## 2018-04-13 ENCOUNTER — Encounter: Payer: Self-pay | Admitting: Family Medicine

## 2018-04-13 VITALS — BP 108/64 | HR 52 | Temp 97.7°F | Resp 16 | Ht 74.0 in | Wt 182.0 lb

## 2018-04-13 DIAGNOSIS — E7800 Pure hypercholesterolemia, unspecified: Secondary | ICD-10-CM

## 2018-04-13 DIAGNOSIS — D692 Other nonthrombocytopenic purpura: Secondary | ICD-10-CM

## 2018-04-13 DIAGNOSIS — R2689 Other abnormalities of gait and mobility: Secondary | ICD-10-CM

## 2018-04-13 DIAGNOSIS — I255 Ischemic cardiomyopathy: Secondary | ICD-10-CM

## 2018-04-13 DIAGNOSIS — G20 Parkinson's disease: Secondary | ICD-10-CM | POA: Insufficient documentation

## 2018-04-13 DIAGNOSIS — G4700 Insomnia, unspecified: Secondary | ICD-10-CM

## 2018-04-13 DIAGNOSIS — I10 Essential (primary) hypertension: Secondary | ICD-10-CM | POA: Diagnosis not present

## 2018-04-13 DIAGNOSIS — Z23 Encounter for immunization: Secondary | ICD-10-CM

## 2018-04-13 DIAGNOSIS — Z0000 Encounter for general adult medical examination without abnormal findings: Secondary | ICD-10-CM | POA: Diagnosis not present

## 2018-04-13 MED ORDER — NITROGLYCERIN 0.4 MG SL SUBL: 0.4000 mg | SUBLINGUAL_TABLET | SUBLINGUAL | 1 refills | Status: DC | PRN

## 2018-04-13 MED ORDER — LISINOPRIL 2.5 MG PO TABS: 2.5000 mg | ORAL_TABLET | Freq: Every day | ORAL | 3 refills | Status: DC

## 2018-04-13 MED ORDER — SIMVASTATIN 20 MG PO TABS: 20.0000 mg | ORAL_TABLET | Freq: Every day | ORAL | 3 refills | Status: DC

## 2018-04-13 MED ORDER — CARVEDILOL 3.125 MG PO TABS: ORAL_TABLET | ORAL | 3 refills | Status: DC

## 2018-04-13 MED ORDER — DICLOFENAC SODIUM 1 % TD GEL: TRANSDERMAL | 11 refills | Status: DC

## 2018-04-13 MED ORDER — TAMSULOSIN HCL 0.4 MG PO CAPS: ORAL_CAPSULE | ORAL | 3 refills | Status: DC

## 2018-04-13 MED ORDER — DIAZEPAM 2 MG PO TABS: 2.0000 mg | ORAL_TABLET | Freq: Every evening | ORAL | 0 refills | Status: DC | PRN

## 2018-04-13 NOTE — Assessment & Plan Note (Signed)
S: he was able to stop the diazepam after last visit- having more trouble sleeping. We stopped due to fall risk.  He asks for diazepam to have just a few on hand A/P: poor control.  For diazepam- agrees to #2w to last him a year just for the bad nights- will use lowest dose at 2 mg

## 2018-04-13 NOTE — Assessment & Plan Note (Signed)
S: Balance continues to be poor. Is able to walk straight once he gets up and going but once he tries to turn it is very difficult for him. Not better with PT in [place]. Stops and recollects and tries to turn but can be hard  Using walker regularly.  A/P: offered PT again - this time at our location- he wants to hold for 6 months and see how things go. If he changes his mind, can let us know.

## 2018-04-13 NOTE — Assessment & Plan Note (Signed)
S: controlled on Coreg 3.125 mg twice daily and lisinopril 2.5 mg- blood pressure low normal but patient denies lightheadedness with standing. BP Readings from Last 3 Encounters:  04/13/18 108/64  04/13/18 108/64  11/09/17 122/64  A/P: We discussed blood pressure goal of <140/90. Continue current meds: For blood pressure would reduce medicines but these are needed due to his cardiomyopathy

## 2018-04-13 NOTE — Assessment & Plan Note (Addendum)
S: Ischemic cardiomyopathy followed by cardiology Dr. Julianne Handler .  Last ejection fraction of 46% on stress Myoview in 2014.  He has been maintained off of Lasix.   A/P: Stable.  We will continue beta-blocker and ACE inhibitor

## 2018-04-13 NOTE — Progress Notes (Signed)
Subjective:   Zachary Decker is a 84 y.o. male who presents for Medicare Annual/Subsequent preventive examination.  Lives in one story home with wife.   Review of Systems:  No ROS.  Medicare Wellness Visit. Additional risk factors are reflected in the social history.  Sleeps 8 hrs/night. Gets up to use restroom once or twice. Wakes up feeling rested. Naps occasionally.   Cardiac Risk Factors include: advanced age (>19men, >40 women);male gender;hypertension;dyslipidemia     Objective:    Vitals: BP 108/64   Pulse (!) 52   Temp 97.7 F (36.5 C) (Oral)   Resp 16   Ht 6\' 2"  (1.88 m)   Wt 182 lb (82.6 kg)   SpO2 98%   BMI 23.37 kg/m   Body mass index is 23.37 kg/m.  Advanced Directives 04/13/2018 04/09/2017 02/26/2012 02/24/2012  Does Patient Have a Medical Advance Directive? No No Patient does not have advance directive Patient does not have advance directive;Patient would not like information  Would patient like information on creating a medical advance directive? Yes (MAU/Ambulatory/Procedural Areas - Information given) Yes (MAU/Ambulatory/Procedural Areas - Information given) - -  Pre-existing out of facility DNR order (yellow form or pink MOST form) - - - No    Tobacco Social History   Tobacco Use  Smoking Status Never Smoker  Smokeless Tobacco Never Used     Counseling given: Not Answered  Past Medical History:  Diagnosis Date  . Acute myocardial infarction of inferoposterior wall, subsequent episode of care ([place])   . CAD (coronary artery disease)   . Encounter for long-term (current) use of other medications   . Family history of malignant neoplasm of gastrointestinal tract   . GERD (gastroesophageal reflux disease)   . Hypercholesterolemia   . Hypertension   . Hypertrophy of prostate with urinary obstruction and other lower urinary tract symptoms (LUTS)   . Personal history of thrombophlebitis   . PVD (peripheral vascular disease) ([place])   . Respiratory  failure ([place])   . Unspecified adverse effect of unspecified drug, medicinal and biological substance   . Unspecified hypothyroidism    Past Surgical History:  Procedure Laterality Date  . CHOLECYSTECTOMY  02/26/2012   Procedure: LAPAROSCOPIC CHOLECYSTECTOMY WITH INTRAOPERATIVE CHOLANGIOGRAM;  Surgeon: Madilyn Hook, DO;  Location: [facility];  Service: General;  Laterality: N/A;  . CORONARY ARTERY BYPASS GRAFT     x 5 on August 29, 2008  . KNEE SURGERY    . tachecostomy placement     after heart attack  . TONSILLECTOMY     Family History  Problem Relation Age of Onset  . Heart disease Mother   . Tuberculosis Father    Social History   Socioeconomic History  . Marital status: Married    Spouse name: Not on file  . Number of children: Not on file  . Years of education: Not on file  . Highest education level: Master's degree (e.g., MA, MS, MEng, MEd, MSW, MBA)  Occupational History    Comment: Retired Careers adviser   Social Needs  . Financial resource strain: Not on file  . Food insecurity:    Worry: Not on file    Inability: Not on file  . Transportation needs:    Medical: Not on file    Non-medical: Not on file  Tobacco Use  . Smoking status: Never Smoker  . Smokeless tobacco: Never Used  Substance and Sexual Activity  . Alcohol use: No  . Drug use: No  . Sexual activity:  Not on file  Lifestyle  . Physical activity:    Days per week: Not on file    Minutes per session: Not on file  . Stress: Not on file  Relationships  . Social connections:    Talks on phone: Not on file    Gets together: Not on file    Attends religious service: Not on file    Active member of club or organization: Not on file    Attends meetings of clubs or organizations: Not on file    Relationship status: Not on file  Other Topics Concern  . Not on file  Social History Narrative   Married 1958. 2 kids (boy, girl). 2 grandkids (boy and girl).       Retired from [employer] ([employer] in  [place])      [place]: former Air cabin crew, tv, reading    Outpatient Encounter Medications as of 04/13/2018  Medication Sig  . Acetaminophen (TYLENOL EXTRA STRENGTH PO) Take 1-2 tablets by mouth daily as needed (for back pain).  Marland Kitchen aspirin 81 MG tablet Take 81 mg by mouth daily.    . B Complex-C-Folic Acid (FOLBEE PLUS) TABS Take 1 tablet by mouth daily.  . carvedilol (COREG) 3.125 MG tablet TAKE 1 TABLET BY MOUTH 2 TIMES A DAY WITH MEAL  . diclofenac sodium (VOLTAREN) 1 % GEL APPLY 2 GRAMS TO AFFECTED AREA 4 TIMES A DAY  . levothyroxine (SYNTHROID, LEVOTHROID) 50 MCG tablet TAKE 1 TABLET (50 MCG TOTAL) BY MOUTH DAILY.  Marland Kitchen lisinopril (PRINIVIL,ZESTRIL) 2.5 MG tablet Take 1 tablet (2.5 mg total) by mouth daily.  . Melatonin ([employer]S MELATONIN) 5 MG TABS Take 2.5 mg by mouth at bedtime.  . nitroGLYCERIN (NITROSTAT) 0.4 MG SL tablet Place 1 tablet (0.4 mg total) under the tongue every 5 (five) minutes as needed for chest pain.  . ranitidine (ZANTAC) 300 MG tablet Take 1 tablet (300 mg total) by mouth at bedtime.  . simvastatin (ZOCOR) 20 MG tablet Take 1 tablet (20 mg total) at bedtime by mouth.  . tamsulosin (FLOMAX) 0.4 MG CAPS capsule TAKE 1 CAPSULE (0.4 MG TOTAL) BY MOUTH DAILY.   No facility-administered encounter medications on file as of 04/13/2018.     Activities of Daily Living In your present state of health, do you have any difficulty performing the following activities: 04/13/2018  Hearing? N  Vision? N  Difficulty concentrating or making decisions? N  Walking or climbing stairs? Y  Dressing or bathing? N  Doing errands, shopping? Y  Preparing Food and eating ? N  Using the Toilet? N  In the past six months, have you accidently leaked urine? N  Do you have problems with loss of bowel control? N  Managing your Medications? N  Managing your Finances? N  Housekeeping or managing your Housekeeping? N  Some recent data might be hidden    Patient Care Team: [name], MD as  PCP - General (Family Medicine) Burnell Blanks, MD as PCP - Cardiology (Cardiology) Eliott, Jeralyn Ruths as Consulting Physician (Optometry) leondard, nelson as Consulting Physician (Dentistry)   Assessment:   This is a routine wellness examination for [facility].  Exercise Activities and Dietary recommendations Current Exercise Habits: Structured exercise class, Type of exercise: strength training/weights;Other - see comments(Elliptical ), Time (Minutes): 60, Frequency (Times/Week): 3, Weekly Exercise (Minutes/Week): 180, Intensity: Moderate, Exercise limited by: orthopedic condition(s)  3 meals/day. 2-3 cups/coffee. A couple glasses of water.  Breakfast: Varies. Eggs or cereal or biscuits or oatmeal  Lunch: Sandwich  Dinner: meat, vegetables  Occassional ice cream or cake.   Goals    . Maintain current health status       Fall Risk Fall Risk  04/13/2018 04/09/2017 10/09/2016 05/30/2016 04/19/2015  Falls in the past year? No No No No No  Comment - - - Emmi Telephone Survey: data to providers prior to load -  Number falls in past yr: - - - - -  Injury with Fall? - - - - -   Depression Screen PHQ 2/9 Scores 04/13/2018 04/09/2017 10/09/2016 04/19/2015  PHQ - 2 Score 0 0 0 0  PHQ- 9 Score 0 - - -  PHQ2 and PHQ9 completed. No signs or symptoms of depression. Enjoys going to the gym. Score 0. Total time spent on topic was 9 minutes.   Cognitive Function Ad8 score reviewed for issues:  Issues making decisions:0  Less interest in hobbies / activities:0  Repeats questions, stories (family complaining):0  Trouble using ordinary gadgets (microwave, computer, phone):0  Forgets the month or year: 0  Mismanaging finances:   Remembering appts:0  Daily problems with thinking and/or memory:0 Ad8 score is=0 Patient reads newspaper daily.            Immunization History  Administered Date(s) Administered  . Influenza Split 03/28/2011, 03/25/2012  . Influenza Whole 06/23/2001,  03/26/2007, 03/24/2008, 04/04/2009, 03/13/2010  . Influenza, High Dose Seasonal PF 04/08/2016, 04/09/2017, 04/13/2018  . Influenza,inj,Quad PF,6+ Mos 03/15/2013, 03/13/2014, 03/21/2015  . Pneumococcal Conjugate-13 10/17/2014  . Pneumococcal Polysaccharide-23 06/23/2005  . Td 06/23/2005   Screening Tests Health Maintenance  Topic Date Due  . INFLUENZA VACCINE  01/21/2018  . TETANUS/TDAP  04/14/2019 (Originally 06/24/2015)  . PNA vac Low Risk Adult  Completed        Plan:   Follow up with PCP as directed.  I have personally reviewed and noted the following in the patient's chart:   . Medical and social history . Use of alcohol, tobacco or illicit drugs  . Current medications and supplements . Functional ability and status . Nutritional status . Physical activity . Advanced directives . List of other physicians . Vitals . Screenings to include cognitive, depression, and falls . Referrals and appointments  In addition, I have reviewed and discussed with patient certain preventive protocols, quality metrics, and best practice recommendations. A written personalized care plan for preventive services as well as general preventive health recommendations were provided to patient.     Williemae Area, RN  04/13/2018

## 2018-04-13 NOTE — Progress Notes (Signed)
Subjective:  Zachary Decker is a 84 y.o. year old very pleasant male patient who presents for/with See problem oriented charting ROS- No chest pain or shortness of breath. No headache or blurry vision.     Past Medical History-  Patient Active Problem List   Diagnosis Date Noted  . Balance disorder 04/13/2018    Priority: High  . CAD w/ hx MI s/p CABG 09/07/2012    Priority: High  . Cardiomyopathy, ischemic 03/22/2010    Priority: High  . Low back pain 04/19/2015    Priority: Medium  . CKD (chronic kidney disease), stage III ([place]) 04/20/2014    Priority: Medium  . Anxiety state 04/17/2014    Priority: Medium  . HYPERCHOLESTEROLEMIA 10/31/2008    Priority: Medium  . BPH (benign prostatic hyperplasia) 09/28/2007    Priority: Medium  . Hypothyroidism 03/26/2007    Priority: Medium  . Essential hypertension 03/22/2007    Priority: Medium  . Senile purpura ([place]) 04/13/2018    Priority: Low  . ANEMIA, VITAMIN B12 DEFICIENCY 04/22/2010    Priority: Low  . Actinic keratosis 06/25/2009    Priority: Low  . BASAL CELL CARCINOMA, NOSE 02/05/2009    Priority: Low  . Localized osteoarthrosis, lower leg 12/06/2008    Priority: Low  . GERD 03/22/2007    Priority: Low  . Insomnia 04/13/2018    Medications- reviewed and updated Current Outpatient Medications  Medication Sig Dispense Refill  . Acetaminophen (TYLENOL EXTRA STRENGTH PO) Take 1-2 tablets by mouth daily as needed (for back pain).    Marland Kitchen aspirin 81 MG tablet Take 81 mg by mouth daily.      . B Complex-C-Folic Acid (FOLBEE PLUS) TABS Take 1 tablet by mouth daily. 90 tablet 1  . carvedilol (COREG) 3.125 MG tablet TAKE 1 TABLET BY MOUTH 2 TIMES A DAY WITH MEAL 180 tablet 3  . diazepam (VALIUM) 2 MG tablet Take 1 tablet (2 mg total) by mouth at bedtime as needed. For anxiety 22 tablet 0  . diclofenac sodium (VOLTAREN) 1 % GEL APPLY 2 GRAMS TO AFFECTED AREA 4 TIMES A DAY 100 g 11  . levothyroxine (SYNTHROID, LEVOTHROID) 50 MCG  tablet TAKE 1 TABLET (50 MCG TOTAL) BY MOUTH DAILY. 90 tablet 3  . lisinopril (PRINIVIL,ZESTRIL) 2.5 MG tablet Take 1 tablet (2.5 mg total) by mouth daily. 90 tablet 3  . Melatonin (CVS MELATONIN) 5 MG TABS Take 2.5 mg by mouth at bedtime.    . nitroGLYCERIN (NITROSTAT) 0.4 MG SL tablet Place 1 tablet (0.4 mg total) under the tongue every 5 (five) minutes as needed for chest pain. 20 tablet 1  . ranitidine (ZANTAC) 300 MG tablet Take 1 tablet (300 mg total) by mouth at bedtime. 90 tablet 3  . simvastatin (ZOCOR) 20 MG tablet Take 1 tablet (20 mg total) by mouth at bedtime. 90 tablet 3  . tamsulosin (FLOMAX) 0.4 MG CAPS capsule TAKE 1 CAPSULE (0.4 MG TOTAL) BY MOUTH DAILY. 90 capsule 3   No current facility-administered medications for this visit.     Objective: BP 108/64   Pulse (!) 52   Temp 97.7 F (36.5 C)   Ht 6\' 2"  (1.88 m)   Wt 182 lb (82.6 kg)   SpO2 97%   BMI 23.37 kg/m  Gen: NAD, resting comfortably CV: Slightly bradycardic but regular no murmurs rubs or gallops Lungs: CTAB no crackles, wheeze, rhonchi Abdomen: soft/nontender/nondistended/normal bowel sounds.  Ext: no edema today Skin: warm, dry Neuro: Depends on walker  for balance  Assessment/Plan:  Other notes: 1. Macrocytosis but with normal b12 and folate in past. Prefers to hold off on blood work for today. Since last labs stable and GFR above 60- I told him this was reasonable.  2 of last 3 GFR have been above 60 on bmet-if persists may change to CKD stage II 2.  Hypothyroidism has been controlled on 50 mcg of levothyroxine.  Update TSH next visit  Balance disorder S: Balance continues to be poor. Is able to walk straight once he gets up and going but once he tries to turn it is very difficult for him. Not better with PT in [place]. Stops and recollects and tries to turn but can be hard  Using walker regularly.  A/P: offered PT again - this time at our location- he wants to hold for 6 months and see how  things go. If he changes his mind, can let us know.   Senile purpura ([facility]) S: easy bruising/bleeding particularly on legs and arms even if barely bumps soemthing A/P: we discussed benign nature of this.  Aspirin likely contributes.  Insomnia S: he was able to stop the diazepam after last visit- having more trouble sleeping. We stopped due to fall risk.  He asks for diazepam to have just a few on hand A/P: poor control.  For diazepam- agrees to #2w to last him a year just for the bad nights- will use lowest dose at 2 mg  Essential hypertension S: controlled on Coreg 3.125 mg twice daily and lisinopril 2.5 mg- blood pressure low normal but patient denies lightheadedness with standing. BP Readings from Last 3 Encounters:  04/13/18 108/64  04/13/18 108/64  11/09/17 122/64  A/P: We discussed blood pressure goal of <140/90. Continue current meds: For blood pressure would reduce medicines but these are needed due to his cardiomyopathy    HYPERCHOLESTEROLEMIA S:  controlled on simvastatin 20 mg in April  A/P: Update lipids next visit.  Stable.    Cardiomyopathy, ischemic S: Ischemic cardiomyopathy followed by cardiology Dr. Julianne Handler .  Last ejection fraction of 46% on stress Myoview in 2014.  He has been maintained off of Lasix.   A/P: Stable.  We will continue beta-blocker and ACE inhibitor   Future Appointments  Date Time Provider [place]  10/14/2018  9:40 AM [name], MD [facility]  04/19/2019  1:00 PM [facility] HEALTH COACH [facility]  04/19/2019  2:20 PM Yong Channel Brayton Mars, MD [facility]  67-month physical advised.   Meds ordered this encounter  Medications  . diclofenac sodium (VOLTAREN) 1 % GEL    Sig: APPLY 2 GRAMS TO AFFECTED AREA 4 TIMES A DAY    Dispense:  100 g    Refill:  11  . diazepam (VALIUM) 2 MG tablet    Sig: Take 1 tablet (2 mg total) by mouth at bedtime as needed. For anxiety    Dispense:  22 tablet    Refill:  0  . carvedilol  (COREG) 3.125 MG tablet    Sig: TAKE 1 TABLET BY MOUTH 2 TIMES A DAY WITH MEAL    Dispense:  180 tablet    Refill:  3  . lisinopril (PRINIVIL,ZESTRIL) 2.5 MG tablet    Sig: Take 1 tablet (2.5 mg total) by mouth daily.    Dispense:  90 tablet    Refill:  3  . nitroGLYCERIN (NITROSTAT) 0.4 MG SL tablet    Sig: Place 1 tablet (0.4 mg total) under the tongue  every 5 (five) minutes as needed for chest pain.    Dispense:  20 tablet    Refill:  1  . simvastatin (ZOCOR) 20 MG tablet    Sig: Take 1 tablet (20 mg total) by mouth at bedtime.    Dispense:  90 tablet    Refill:  3  . tamsulosin (FLOMAX) 0.4 MG CAPS capsule    Sig: TAKE 1 CAPSULE (0.4 MG TOTAL) BY MOUTH DAILY.    Dispense:  90 capsule    Refill:  3   Return precautions advised.  Garret Reddish, MD

## 2018-04-13 NOTE — Progress Notes (Addendum)
PCP notes:   Health maintenance: Tdap: will check with pharmacy. Flu: will receive today.    Abnormal screenings: None.   Patient concerns: Patient would like to discuss balance with Dr Yong Channel.   Nurse concerns: None.   Next PCP appt: Same day.    I have reviewed and agree with note, evaluation, plan. See my separate note from today.   Garret Reddish, MD

## 2018-04-13 NOTE — Patient Instructions (Signed)
Zachary Decker , Thank you for taking time to come for your Medicare Wellness Visit. I appreciate your ongoing commitment to your health goals. Please review the following plan we discussed and let me know if I can assist you in the future.   These are the goals we discussed: Goals    . Maintain current health status       This is a list of the screening recommended for you and due dates:  Health Maintenance  Topic Date Due  . Flu Shot  01/21/2018  . Tetanus Vaccine  04/14/2019*  . Pneumonia vaccines  Completed  *Topic was postponed. The date shown is not the original due date.   Preventive Care for Adults  A healthy lifestyle and preventive care can promote health and wellness. Preventive health guidelines for adults include the following key practices.  . A routine yearly physical is a good way to check with your health care provider about your health and preventive screening. It is a chance to share any concerns and updates on your health and to receive a thorough exam.  . Visit your dentist for a routine exam and preventive care every 6 months. Brush your teeth twice a day and floss once a day. Good oral hygiene prevents tooth decay and gum disease.  . The frequency of eye exams is based on your age, health, family medical history, use  of contact lenses, and other factors. Follow your health care provider's recommendations for frequency of eye exams.  . Eat a healthy diet. Foods like vegetables, fruits, whole grains, low-fat dairy products, and lean protein foods contain the nutrients you need without too many calories. Decrease your intake of foods high in solid fats, added sugars, and salt. Eat the right amount of calories for you. Get information about a proper diet from your health care provider, if necessary.  . Regular physical exercise is one of the most important things you can do for your health. Most adults should get at least 150 minutes of moderate-intensity exercise (any  activity that increases your heart rate and causes you to sweat) each week. In addition, most adults need muscle-strengthening exercises on 2 or more days a week.  Silver Sneakers may be a benefit available to you. To determine eligibility, you may visit the website: www.silversneakers.com or contact program at 857-262-0811 Mon-Fri between 8AM-8PM.   . Maintain a healthy weight. The body mass index (BMI) is a screening tool to identify possible weight problems. It provides an estimate of body fat based on height and weight. Your health care provider can find your BMI and can help you achieve or maintain a healthy weight.   For adults 20 years and older: ? A BMI below 18.5 is considered underweight. ? A BMI of 18.5 to 24.9 is normal. ? A BMI of 25 to 29.9 is considered overweight. ? A BMI of 30 and above is considered obese.   . Maintain normal blood lipids and cholesterol levels by exercising and minimizing your intake of saturated fat. Eat a balanced diet with plenty of fruit and vegetables. Blood tests for lipids and cholesterol should begin at age 19 and be repeated every 5 years. If your lipid or cholesterol levels are high, you are over 50, or you are at high risk for heart disease, you may need your cholesterol levels checked more frequently. Ongoing high lipid and cholesterol levels should be treated with medicines if diet and exercise are not working.  . If you smoke, find  out from your health care provider how to quit. If you do not use tobacco, please do not start.  . If you choose to drink alcohol, please do not consume more than 2 drinks per day. One drink is considered to be 12 ounces (355 mL) of beer, 5 ounces (148 mL) of wine, or 1.5 ounces (44 mL) of liquor.  . If you are 35-7 years old, ask your health care provider if you should take aspirin to prevent strokes.  . Use sunscreen. Apply sunscreen liberally and repeatedly throughout the day. You should seek shade when your  shadow is shorter than you. Protect yourself by wearing long sleeves, pants, a wide-brimmed hat, and sunglasses year round, whenever you are outdoors.  . Once a month, do a whole body skin exam, using a mirror to look at the skin on your back. Tell your health care provider of new moles, moles that have irregular borders, moles that are larger than a pencil eraser, or moles that have changed in shape or color.

## 2018-04-13 NOTE — Assessment & Plan Note (Signed)
S:  controlled on simvastatin 20 mg in April  A/P: Update lipids next visit.  Stable.

## 2018-04-13 NOTE — Patient Instructions (Addendum)
Health Maintenance Due  Topic Date Due  . INFLUENZA VACCINE - thanks for doing this today!  01/21/2018   Check with your pharmacist about your ranitidine- if it is on recall- Trial pepcid/famotidine 20mg  instead of the zantac/ranitidine   No other changes today  We will update labs next time since you wanted to hold off today

## 2018-04-13 NOTE — Assessment & Plan Note (Signed)
S: easy bruising/bleeding particularly on legs and arms even if barely bumps soemthing A/P: we discussed benign nature of this.  Aspirin likely contributes.

## 2018-05-07 ENCOUNTER — Other Ambulatory Visit: Payer: Self-pay | Admitting: Family Medicine

## 2018-05-09 ENCOUNTER — Other Ambulatory Visit: Payer: Self-pay | Admitting: Family Medicine

## 2018-07-22 ENCOUNTER — Other Ambulatory Visit: Payer: Self-pay | Admitting: Family Medicine

## 2018-10-07 ENCOUNTER — Emergency Department (HOSPITAL_COMMUNITY): Payer: Medicare Other

## 2018-10-07 ENCOUNTER — Inpatient Hospital Stay (HOSPITAL_COMMUNITY)
Admission: EM | Admit: 2018-10-07 | Discharge: 2018-10-11 | DRG: 871 | Disposition: A | Discharge status: Home Health | Payer: Medicare Other | Attending: Internal Medicine | Admitting: Internal Medicine

## 2018-10-07 ENCOUNTER — Encounter: Payer: Self-pay | Admitting: Family Medicine

## 2018-10-07 ENCOUNTER — Other Ambulatory Visit: Payer: Self-pay

## 2018-10-07 ENCOUNTER — Ambulatory Visit (INDEPENDENT_AMBULATORY_CARE_PROVIDER_SITE_OTHER): Payer: Medicare Other | Admitting: Family Medicine

## 2018-10-07 ENCOUNTER — Encounter (HOSPITAL_COMMUNITY): Payer: Self-pay

## 2018-10-07 VITALS — HR 95 | Ht 74.0 in | Wt 185.0 lb

## 2018-10-07 DIAGNOSIS — N183 Chronic kidney disease, stage 3 (moderate): Secondary | ICD-10-CM | POA: Diagnosis present

## 2018-10-07 DIAGNOSIS — R6510 Systemic inflammatory response syndrome (SIRS) of non-infectious origin without acute organ dysfunction: Secondary | ICD-10-CM | POA: Diagnosis not present

## 2018-10-07 DIAGNOSIS — A419 Sepsis, unspecified organism: Secondary | ICD-10-CM | POA: Diagnosis present

## 2018-10-07 DIAGNOSIS — D539 Nutritional anemia, unspecified: Secondary | ICD-10-CM | POA: Diagnosis present

## 2018-10-07 DIAGNOSIS — K219 Gastro-esophageal reflux disease without esophagitis: Secondary | ICD-10-CM | POA: Diagnosis present

## 2018-10-07 DIAGNOSIS — I129 Hypertensive chronic kidney disease with stage 1 through stage 4 chronic kidney disease, or unspecified chronic kidney disease: Secondary | ICD-10-CM | POA: Diagnosis present

## 2018-10-07 DIAGNOSIS — D7589 Other specified diseases of blood and blood-forming organs: Secondary | ICD-10-CM | POA: Diagnosis not present

## 2018-10-07 DIAGNOSIS — I2510 Atherosclerotic heart disease of native coronary artery without angina pectoris: Secondary | ICD-10-CM | POA: Diagnosis present

## 2018-10-07 DIAGNOSIS — Z951 Presence of aortocoronary bypass graft: Secondary | ICD-10-CM

## 2018-10-07 DIAGNOSIS — E039 Hypothyroidism, unspecified: Secondary | ICD-10-CM | POA: Diagnosis present

## 2018-10-07 DIAGNOSIS — R531 Weakness: Secondary | ICD-10-CM | POA: Diagnosis not present

## 2018-10-07 DIAGNOSIS — I252 Old myocardial infarction: Secondary | ICD-10-CM | POA: Diagnosis not present

## 2018-10-07 DIAGNOSIS — Z9049 Acquired absence of other specified parts of digestive tract: Secondary | ICD-10-CM

## 2018-10-07 DIAGNOSIS — G9341 Metabolic encephalopathy: Secondary | ICD-10-CM | POA: Diagnosis present

## 2018-10-07 DIAGNOSIS — Z888 Allergy status to other drugs, medicaments and biological substances status: Secondary | ICD-10-CM

## 2018-10-07 DIAGNOSIS — N401 Enlarged prostate with lower urinary tract symptoms: Secondary | ICD-10-CM | POA: Diagnosis present

## 2018-10-07 DIAGNOSIS — Z800 Family history of malignant neoplasm of digestive organs: Secondary | ICD-10-CM

## 2018-10-07 DIAGNOSIS — Z79899 Other long term (current) drug therapy: Secondary | ICD-10-CM | POA: Diagnosis not present

## 2018-10-07 DIAGNOSIS — Z8672 Personal history of thrombophlebitis: Secondary | ICD-10-CM

## 2018-10-07 DIAGNOSIS — I739 Peripheral vascular disease, unspecified: Secondary | ICD-10-CM | POA: Diagnosis present

## 2018-10-07 DIAGNOSIS — E785 Hyperlipidemia, unspecified: Secondary | ICD-10-CM | POA: Diagnosis present

## 2018-10-07 DIAGNOSIS — Z79890 Hormone replacement therapy (postmenopausal): Secondary | ICD-10-CM | POA: Diagnosis not present

## 2018-10-07 DIAGNOSIS — R29898 Other symptoms and signs involving the musculoskeletal system: Secondary | ICD-10-CM | POA: Diagnosis not present

## 2018-10-07 DIAGNOSIS — Z85828 Personal history of other malignant neoplasm of skin: Secondary | ICD-10-CM | POA: Diagnosis not present

## 2018-10-07 DIAGNOSIS — Z7982 Long term (current) use of aspirin: Secondary | ICD-10-CM | POA: Diagnosis not present

## 2018-10-07 DIAGNOSIS — D72829 Elevated white blood cell count, unspecified: Secondary | ICD-10-CM

## 2018-10-07 DIAGNOSIS — Z9181 History of falling: Secondary | ICD-10-CM

## 2018-10-07 DIAGNOSIS — Z8249 Family history of ischemic heart disease and other diseases of the circulatory system: Secondary | ICD-10-CM

## 2018-10-07 DIAGNOSIS — L03116 Cellulitis of left lower limb: Secondary | ICD-10-CM

## 2018-10-07 DIAGNOSIS — Z880 Allergy status to penicillin: Secondary | ICD-10-CM | POA: Diagnosis not present

## 2018-10-07 LAB — COMPREHENSIVE METABOLIC PANEL
ALT: 28 U/L (ref 0–44)
AST: 47 U/L — ABNORMAL HIGH (ref 15–41)
Albumin: 3.3 g/dL — ABNORMAL LOW (ref 3.5–5.0)
Alkaline Phosphatase: 37 U/L — ABNORMAL LOW (ref 38–126)
Anion gap: 9 (ref 5–15)
BUN: 39 mg/dL — ABNORMAL HIGH (ref 8–23)
CO2: 23 mmol/L (ref 22–32)
Calcium: 8.9 mg/dL (ref 8.9–10.3)
Chloride: 103 mmol/L (ref 98–111)
Creatinine, Ser: 1.39 mg/dL — ABNORMAL HIGH (ref 0.61–1.24)
GFR calc Af Amer: 54 mL/min — ABNORMAL LOW (ref >60)
GFR calc non Af Amer: 47 mL/min — ABNORMAL LOW (ref >60)
Glucose, Bld: 124 mg/dL — ABNORMAL HIGH (ref 70–99)
Potassium: 4.3 mmol/L (ref 3.5–5.1)
Sodium: 135 mmol/L (ref 135–145)
Total Bilirubin: 1 mg/dL (ref 0.3–1.2)
Total Protein: 6.2 g/dL — ABNORMAL LOW (ref 6.5–8.1)

## 2018-10-07 LAB — URINALYSIS, ROUTINE W REFLEX MICROSCOPIC
Bilirubin Urine: NEGATIVE
Glucose, UA: NEGATIVE mg/dL
Hgb urine dipstick: NEGATIVE
Ketones, ur: NEGATIVE mg/dL
Leukocytes,Ua: NEGATIVE
Nitrite: NEGATIVE
Protein, ur: NEGATIVE mg/dL
Specific Gravity, Urine: 1.025 (ref 1.005–1.030)
pH: 5 (ref 5.0–8.0)

## 2018-10-07 LAB — CBC
HCT: 33 % — ABNORMAL LOW (ref 39.0–52.0)
Hemoglobin: 11.4 g/dL — ABNORMAL LOW (ref 13.0–17.0)
MCH: 34.8 pg — ABNORMAL HIGH (ref 26.0–34.0)
MCHC: 34.5 g/dL (ref 30.0–36.0)
MCV: 100.6 fL — ABNORMAL HIGH (ref 80.0–100.0)
Platelets: 179 10*3/uL (ref 150–400)
RBC: 3.28 MIL/uL — ABNORMAL LOW (ref 4.22–5.81)
RDW: 12.6 % (ref 11.5–15.5)
WBC: 30.4 10*3/uL — ABNORMAL HIGH (ref 4.0–10.5)
nRBC: 0 % (ref 0.0–0.2)

## 2018-10-07 LAB — LACTIC ACID, PLASMA: Lactic Acid, Venous: 1.1 mmol/L (ref 0.5–1.9)

## 2018-10-07 LAB — VITAMIN B12: Vitamin B-12: 1340 pg/mL — ABNORMAL HIGH (ref 180–914)

## 2018-10-07 LAB — PROCALCITONIN: Procalcitonin: 0.4 ng/mL

## 2018-10-07 MED ORDER — FOLBEE PLUS PO TABS: 1.0000 | ORAL_TABLET | Freq: Every day | ORAL | Status: DC

## 2018-10-07 MED ORDER — TAMSULOSIN HCL 0.4 MG PO CAPS
0.4000 mg | ORAL_CAPSULE | Freq: Every day | ORAL | Status: DC
  Administered 2018-10-07 – 2018-10-10 (×4): 0.4 mg via ORAL
  Filled 2018-10-07 (×4): qty 1

## 2018-10-07 MED ORDER — VITAMIN B-12 100 MCG PO TABS
500.0000 ug | ORAL_TABLET | Freq: Every day | ORAL | Status: DC
  Administered 2018-10-08: 500 ug via ORAL
  Filled 2018-10-07 (×2): qty 5

## 2018-10-07 MED ORDER — CARVEDILOL 3.125 MG PO TABS
3.1250 mg | ORAL_TABLET | Freq: Two times a day (BID) | ORAL | Status: DC
  Administered 2018-10-08 – 2018-10-11 (×7): 3.125 mg via ORAL
  Filled 2018-10-07 (×8): qty 1

## 2018-10-07 MED ORDER — SIMVASTATIN 20 MG PO TABS
20.0000 mg | ORAL_TABLET | Freq: Every day | ORAL | Status: DC
  Administered 2018-10-07 – 2018-10-10 (×4): 20 mg via ORAL
  Filled 2018-10-07 (×4): qty 1

## 2018-10-07 MED ORDER — FAMOTIDINE 20 MG PO TABS
20.0000 mg | ORAL_TABLET | Freq: Every day | ORAL | Status: DC
  Administered 2018-10-07 – 2018-10-10 (×4): 20 mg via ORAL
  Filled 2018-10-07 (×4): qty 1

## 2018-10-07 MED ORDER — ASPIRIN EC 81 MG PO TBEC
81.0000 mg | DELAYED_RELEASE_TABLET | Freq: Every day | ORAL | Status: DC
  Administered 2018-10-07 – 2018-10-11 (×5): 81 mg via ORAL
  Filled 2018-10-07 (×5): qty 1

## 2018-10-07 MED ORDER — VANCOMYCIN HCL 10 G IV SOLR
1500.0000 mg | Freq: Once | INTRAVENOUS | Status: AC
  Administered 2018-10-07: 19:00:00 1500 mg via INTRAVENOUS
  Filled 2018-10-07: qty 1500

## 2018-10-07 MED ORDER — ADULT MULTIVITAMIN W/MINERALS CH
1.0000 | ORAL_TABLET | Freq: Every day | ORAL | Status: DC
  Administered 2018-10-08 – 2018-10-11 (×4): 1 via ORAL
  Filled 2018-10-07 (×5): qty 1

## 2018-10-07 MED ORDER — SODIUM CHLORIDE 0.9 % IV SOLN
2.0000 g | Freq: Two times a day (BID) | INTRAVENOUS | Status: DC
  Administered 2018-10-07 – 2018-10-08 (×2): 2 g via INTRAVENOUS
  Filled 2018-10-07 (×4): qty 2

## 2018-10-07 MED ORDER — VANCOMYCIN HCL 10 G IV SOLR
1250.0000 mg | INTRAVENOUS | Status: DC
  Administered 2018-10-08 – 2018-10-09 (×2): 1250 mg via INTRAVENOUS
  Filled 2018-10-07 (×3): qty 1250

## 2018-10-07 MED ORDER — ENOXAPARIN SODIUM 40 MG/0.4ML ~~LOC~~ SOLN
40.0000 mg | SUBCUTANEOUS | Status: DC
  Administered 2018-10-07 – 2018-10-10 (×4): 40 mg via SUBCUTANEOUS
  Filled 2018-10-07 (×4): qty 0.4

## 2018-10-07 MED ORDER — VANCOMYCIN HCL IN DEXTROSE 1-5 GM/200ML-% IV SOLN: 1000.0000 mg | Freq: Once | INTRAVENOUS | Status: DC

## 2018-10-07 MED ORDER — LEVOTHYROXINE SODIUM 50 MCG PO TABS
50.0000 ug | ORAL_TABLET | Freq: Every day | ORAL | Status: DC
  Administered 2018-10-08 – 2018-10-11 (×4): 50 ug via ORAL
  Filled 2018-10-07 (×4): qty 1

## 2018-10-07 MED ORDER — SODIUM CHLORIDE 0.9 % IV SOLN
2.0000 g | Freq: Once | INTRAVENOUS | Status: AC
  Administered 2018-10-07: 19:00:00 2 g via INTRAVENOUS
  Filled 2018-10-07: qty 20

## 2018-10-07 NOTE — H&P (Signed)
History and Physical  Zachary Decker:[id number] DOB: [birth year] DOA: 10/07/2018  Referring physician: ER provider  PCP: [name], MD  Outpatient Specialists:    Patient coming from: Home  Chief Complaint: Generalized weakness  HPI: Patient is an 85 year old Caucasian male with past medical history significant for coronary artery disease/MI, hypertension, hyperlipidemia, BPH with urinary obstruction, PVD and respiratory failure.  Patient also has history of thrombophlebitis.  Patient is a poor historian.  Patient presented to the hospital feeling weak.  Apparently, patient was also said to have fallen at home.  Imaging studies following the fall was nonrevealing.  However, WBC was noted to be 30,000.  Left leg cellulitis/redness is noted.  No documented fever or chills.  No headache, no neck pain, no chest pain, no shortness of breath, no GI symptoms and no urinary symptoms  ED Course:  Pertinent labs: BMP revealed sodium of 135, potassium of 4.3, chloride 103, CO2 23, BUN of 39 with creatinine of 1.39.  Baseline creatinine is 1.19.  Albumin is 3.3.  CBC reveals WBC of 30.4, hemoglobin of 11.4, hematocrit of 30, MCV of 100.6, platelet count of 179.  EKG: Independently reviewed.    Imaging: independently reviewed.   Review of Systems: Patient is a poor historian.  Negative for fever, visual changes, sore throat, new muscle aches, chest pain, SOB, dysuria, bleeding, n/v/abdominal pain.  Past Medical History:  Diagnosis Date  . Acute myocardial infarction of inferoposterior wall, subsequent episode of care ([place])   . CAD (coronary artery disease)   . Encounter for long-term (current) use of other medications   . Family history of malignant neoplasm of gastrointestinal tract   . GERD (gastroesophageal reflux disease)   . Hypercholesterolemia   . Hypertension   . Hypertrophy of prostate with urinary obstruction and other lower urinary tract symptoms (LUTS)   . Personal history  of thrombophlebitis   . PVD (peripheral vascular disease) ([place])   . Respiratory failure ([place])   . Unspecified adverse effect of unspecified drug, medicinal and biological substance   . Unspecified hypothyroidism     Past Surgical History:  Procedure Laterality Date  . CHOLECYSTECTOMY  02/26/2012   Procedure: LAPAROSCOPIC CHOLECYSTECTOMY WITH INTRAOPERATIVE CHOLANGIOGRAM;  Surgeon: Madilyn Hook, DO;  Location: [place];  Service: General;  Laterality: N/A;  . CORONARY ARTERY BYPASS GRAFT     x 5 on August 29, 2008  . KNEE SURGERY    . tachecostomy placement     after heart attack  . TONSILLECTOMY       reports that he has never smoked. He has never used smokeless tobacco. He reports that he does not drink alcohol or use drugs.  Allergies  Allergen Reactions  . Losartan Potassium Other (See Comments)    Not known  . Naproxen Nausea And Vomiting  . Penicillins Rash    Did it involve swelling of the face/tongue/throat, SOB, or low BP? No Did it involve sudden or severe rash/hives, skin peeling, or any reaction on the inside of your mouth or nose? Yes Did you need to seek medical attention at a hospital or doctor's office? Unknown When did it last happen? If all above answers are "NO", may proceed with cephalosporin use.     Family History  Problem Relation Age of Onset  . Heart disease Mother   . Tuberculosis Father      Prior to Admission medications   Medication Sig Start Date End Date Taking? Authorizing Provider  Acetaminophen (TYLENOL EXTRA STRENGTH  PO) Take 1-2 tablets by mouth daily as needed (for back pain).   Yes [provider]  aspirin 81 MG tablet Take 81 mg by mouth daily.     Yes [provider]  B Complex-C-Folic Acid (FOLBEE PLUS) TABS TAKE 1 TABLET BY MOUTH EVERY DAY 05/10/18  Yes [name], MD  carvedilol (COREG) 3.125 MG tablet TAKE 1 TABLET BY MOUTH 2 TIMES A DAY WITH MEAL Patient taking differently: Take 3.125 mg by mouth 2  (two) times daily with a meal.  04/13/18  Yes [name], MD  diclofenac sodium (VOLTAREN) 1 % GEL Apply 4 g topically at bedtime as needed (neck pain).   Yes [provider]  levothyroxine (SYNTHROID, LEVOTHROID) 50 MCG tablet TAKE 1 TABLET (50 MCG TOTAL) BY MOUTH DAILY. Patient taking differently: Take 50 mcg by mouth daily before breakfast.  11/20/17  Yes [name], MD  lisinopril (PRINIVIL,ZESTRIL) 2.5 MG tablet Take 1 tablet (2.5 mg total) by mouth daily. 04/13/18  Yes [name], MD  Melatonin ([employer]S MELATONIN) 5 MG TABS Take 2.5 mg by mouth at bedtime.   Yes [provider]  Multiple Vitamins-Minerals (CENTRUM SILVER 50+MEN) TABS Take 1 tablet by mouth daily.   Yes [provider]  nitroGLYCERIN (NITROSTAT) 0.4 MG SL tablet Place 1 tablet (0.4 mg total) under the tongue every 5 (five) minutes as needed for chest pain. 04/13/18  Yes [name], MD  OVER THE COUNTER MEDICATION Take 1 capsule by mouth daily. Probiotic   Yes [provider]  ranitidine (ZANTAC) 300 MG tablet TAKE 1 TABLET BY MOUTH EVERYDAY AT BEDTIME Patient taking differently: Take 150 mg by mouth every evening.  07/22/18  Yes [name], MD  simvastatin (ZOCOR) 20 MG tablet Take 1 tablet (20 mg total) by mouth at bedtime. 04/13/18  Yes [name], MD  tamsulosin (FLOMAX) 0.4 MG CAPS capsule TAKE 1 CAPSULE (0.4 MG TOTAL) BY MOUTH DAILY. Patient taking differently: Take 0.4 mg by mouth at bedtime.  04/13/18  Yes [name], MD  vitamin B-12 (CYANOCOBALAMIN) 500 MCG tablet Take 500 mcg by mouth daily.   Yes [provider]  diazepam (VALIUM) 2 MG tablet Take 1 tablet (2 mg total) by mouth at bedtime as needed. For anxiety Patient not taking: Reported on 10/07/2018 04/13/18   [name], MD  diclofenac sodium (VOLTAREN) 1 % GEL APPLY 2 GRAMS TO AFFECTED AREA 4 TIMES A DAY Patient not taking: Reported on 10/07/2018 04/13/18   [name], MD    Physical Exam: Vitals:   10/07/18 1600 10/07/18 1645 10/07/18 1730 10/07/18 1800  BP: 114/60 120/70 124/66   Pulse: 65 61 64   Resp: (!) 21 20 18    Temp:    98.5 F (36.9 C)  TempSrc:    Oral  SpO2: 99% 97% 100%   Weight:      Height:        Constitutional:  . Appears calm and comfortable Eyes:  . No pallor. No jaundice.  ENMT:  . external ears, nose appear normal Neck:  . Neck is supple. No JVD Respiratory:  . CTA bilaterally, no w/r/r.  . Respiratory effort normal. No retractions or accessory muscle use Cardiovascular:  . S1S2 . Cellulitic changes left leg/foot noted with some swelling. Abdomen:  . Abdomen is soft and non tender. Organs are difficult to assess. Neurologic:  . Awake and alert. . Moves all limbs.  Wt Readings  from Last 3 Encounters:  10/07/18 83.9 kg  10/07/18 83.9 kg  04/13/18 82.6 kg    I have personally reviewed following labs and imaging studies  Labs on Admission:  CBC: Recent Labs  Lab 10/07/18 1224  WBC 30.4*  HGB 11.4*  HCT 33.0*  MCV 100.6*  PLT 177   Basic Metabolic Panel: Recent Labs  Lab 10/07/18 1224  NA 135  K 4.3  CL 103  CO2 23  GLUCOSE 124*  BUN 39*  CREATININE 1.39*  CALCIUM 8.9   Liver Function Tests: Recent Labs  Lab 10/07/18 1224  AST 47*  ALT 28  ALKPHOS 37*  BILITOT 1.0  PROT 6.2*  ALBUMIN 3.3*   No results for input(s): LIPASE, AMYLASE in the last 168 hours. No results for input(s): AMMONIA in the last 168 hours. Coagulation Profile: No results for input(s): INR, PROTIME in the last 168 hours. Cardiac Enzymes: No results for input(s): CKTOTAL, CKMB, CKMBINDEX, TROPONINI in the last 168 hours. BNP (last 3 results) No results for input(s): PROBNP in the last 8760 hours. HbA1C: No results for input(s): HGBA1C in the last 72 hours. CBG: No results for input(s): GLUCAP in the last 168 hours. Lipid Profile: No results for input(s): CHOL, HDL, LDLCALC, TRIG, CHOLHDL,  LDLDIRECT in the last 72 hours. Thyroid Function Tests: No results for input(s): TSH, T4TOTAL, FREET4, T3FREE, THYROIDAB in the last 72 hours. Anemia Panel: No results for input(s): VITAMINB12, FOLATE, FERRITIN, TIBC, IRON, RETICCTPCT in the last 72 hours. Urine analysis:    Component Value Date/Time   COLORURINE YELLOW 10/07/2018 1700   APPEARANCEUR CLEAR 10/07/2018 1700   LABSPEC 1.025 10/07/2018 1700   PHURINE 5.0 10/07/2018 1700   GLUCOSEU NEGATIVE 10/07/2018 1700   HGBUR NEGATIVE 10/07/2018 1700   BILIRUBINUR NEGATIVE 10/07/2018 1700   BILIRUBINUR n 08/10/2012 1612   KETONESUR NEGATIVE 10/07/2018 1700   PROTEINUR NEGATIVE 10/07/2018 1700   UROBILINOGEN 0.2 08/10/2012 1612   UROBILINOGEN 0.2 02/24/2012 0133   NITRITE NEGATIVE 10/07/2018 1700   LEUKOCYTESUR NEGATIVE 10/07/2018 1700   Sepsis Labs: @LABRCNTIP (procalcitonin:4,lacticidven:4) )No results found for this or any previous visit (from the past 240 hour(s)).    Radiological Exams on Admission: Ct Head Wo Contrast  Result Date: 10/07/2018 CLINICAL DATA:  Fall yesterday. EXAM: CT HEAD WITHOUT CONTRAST CT CERVICAL SPINE WITHOUT CONTRAST TECHNIQUE: Multidetector CT imaging of the head and cervical spine was performed following the standard protocol without intravenous contrast. Multiplanar CT image reconstructions of the cervical spine were also generated. COMPARISON:  None. FINDINGS: CT HEAD FINDINGS Brain: No evidence of acute infarction, hemorrhage, hydrocephalus, extra-axial collection or mass lesion/mass effect. Mild generalized cerebral atrophy. Scattered mild periventricular and subcortical white matter hypodensities are nonspecific, but favored to reflect chronic microvascular ischemic changes. Vascular: Calcified atherosclerosis at the skullbase. No hyperdense vessel. Skull: Normal. Negative for fracture or focal lesion. Sinuses/Orbits: No acute finding. Other: None. CT CERVICAL SPINE FINDINGS Alignment: No traumatic  malalignment. Facet mediated 4 mm anterolisthesis at C5-C6 and 3 mm anterolisthesis at C7-T1. Skull base and vertebrae: No acute fracture. No primary bone lesion or focal pathologic process. Soft tissues and spinal canal: No prevertebral fluid or swelling. No visible canal hematoma. Disc levels:  Diffuse moderate multilevel cervical spondylosis. Upper chest: Negative. Other: None. IMPRESSION: 1. No acute intracranial abnormality. Mild atrophy and chronic microvascular ischemic changes. 2. No acute cervical spine fracture. Moderate cervical spondylosis. Electronically Signed   By: Titus Dubin M.D.   On: 10/07/2018 14:36   Ct Cervical Spine  Wo Contrast  Result Date: 10/07/2018 CLINICAL DATA:  Fall yesterday. EXAM: CT HEAD WITHOUT CONTRAST CT CERVICAL SPINE WITHOUT CONTRAST TECHNIQUE: Multidetector CT imaging of the head and cervical spine was performed following the standard protocol without intravenous contrast. Multiplanar CT image reconstructions of the cervical spine were also generated. COMPARISON:  None. FINDINGS: CT HEAD FINDINGS Brain: No evidence of acute infarction, hemorrhage, hydrocephalus, extra-axial collection or mass lesion/mass effect. Mild generalized cerebral atrophy. Scattered mild periventricular and subcortical white matter hypodensities are nonspecific, but favored to reflect chronic microvascular ischemic changes. Vascular: Calcified atherosclerosis at the skullbase. No hyperdense vessel. Skull: Normal. Negative for fracture or focal lesion. Sinuses/Orbits: No acute finding. Other: None. CT CERVICAL SPINE FINDINGS Alignment: No traumatic malalignment. Facet mediated 4 mm anterolisthesis at C5-C6 and 3 mm anterolisthesis at C7-T1. Skull base and vertebrae: No acute fracture. No primary bone lesion or focal pathologic process. Soft tissues and spinal canal: No prevertebral fluid or swelling. No visible canal hematoma. Disc levels:  Diffuse moderate multilevel cervical spondylosis. Upper  chest: Negative. Other: None. IMPRESSION: 1. No acute intracranial abnormality. Mild atrophy and chronic microvascular ischemic changes. 2. No acute cervical spine fracture. Moderate cervical spondylosis. Electronically Signed   By: Titus Dubin M.D.   On: 10/07/2018 14:36   Dg Chest Port 1 View  Result Date: 10/07/2018 CLINICAL DATA:  Generalized weakness.  Hypertension. EXAM: PORTABLE CHEST 1 VIEW COMPARISON:  Oct 29, 2009 FINDINGS: A portion of the right apex is not appreciated due to overlying of patient mandible. There is scarring in the right base with chronic blunting of the right costophrenic angle. There is no evident edema or consolidation. The heart size and pulmonary vascularity are normal. No adenopathy. Patient is status post coronary artery bypass grafting. There is aortic atherosclerosis. No bone lesions. IMPRESSION: Chronic scarring right base. No edema or consolidation. Note that right apex is partially obscured by the patient's mandible. Heart size normal. Status post coronary artery bypass grafting. Aortic Atherosclerosis (ICD10-I70.0). Electronically Signed   By: Lowella Grip III M.D.   On: 10/07/2018 14:15    EKG: Independently reviewed.   Active Problems:   * No active hospital problems. *   Assessment/Plan SIRS/Cellulitis left lower extremity and foot: WBC is 30,000 Cellulitic changes of left lower leg and foot noted Not sure if cellulitis can explain this level of leukocytosis Low threshold to refer patient to heme-onc for further assessment and management MCV is also noted to be elevated. Panculture patient Broad-spectrum antibiotics  Leukocytosis: Kindly see above. Repeat CBC in a.m.  Macrocytic anemia: Check B12 and folate level. Leukocytosis noted Low threshold to refer patient to heme-onc. Cannot rule out MDS versus leukemic process  Weakness: Continue to assess Psychotherapy consult   DVT prophylaxis: Subacute Lovenox Code Status: Full  Family Communication:  Disposition Plan: Home  Consults called: None Admission status: Inpatient  Time spent: 65 minutes  Dana Allan, MD  [facility] Pager #: [phone] 7PM-7AM contact night coverage as above  10/07/2018, 7:10 PM

## 2018-10-07 NOTE — ED Triage Notes (Signed)
Pt arrives with [facility] from home c/o falling yesterday at 3pm. Pt reports weakness, but did not LOC. PCP concerned for possible stroke. Pt reports stiff neck stating "he slept wrong on it." Per EMS, pt had BP of 95/57 en route. Given 500 ml fluid bolus en route by EMS; vitals after bolus:  BP 108/66 HR 60's O2  94% on RA Temp 99.1 CBG 130

## 2018-10-07 NOTE — Progress Notes (Addendum)
Pharmacy Antibiotic Note  Zachary Decker is a 85 y.o. male admitted on 10/07/2018 with cellulitis.    Plan: Vanc 1500 mg x 1 then 1250 q24h Cefepime 2 g q12h Monitor renal fx cx vanc  lvls prn  Height: 6\' 2"  (188 cm) Weight: 185 lb (83.9 kg) IBW/kg (Calculated) : 82.2  Temp (24hrs), Avg:98.2 F (36.8 C), Min:97.9 F (36.6 C), Max:98.5 F (36.9 C)  Recent Labs  Lab 10/07/18 1224 10/07/18 1354  WBC 30.4*  --   CREATININE 1.39*  --   LATICACIDVEN  --  1.1    Estimated Creatinine Clearance: 46.8 mL/min (A) (by C-G formula based on SCr of 1.39 mg/dL (H)).    Allergies  Allergen Reactions  . Losartan Potassium Other (See Comments)    Not known  . Naproxen Nausea And Vomiting  . Penicillins Rash    Did it involve swelling of the face/tongue/throat, SOB, or low BP? No Did it involve sudden or severe rash/hives, skin peeling, or any reaction on the inside of your mouth or nose? Yes Did you need to seek medical attention at a hospital or doctor's office? Unknown When did it last happen? If all above answers are "NO", may proceed with cephalosporin use.    Vancomycin 1250 mg IV Q 24 hrs. Goal AUC 400-550. Expected AUC: 477 SCr used: 1.39  [name], PharmD, BCPS, BCCCP Clinical Pharmacist [phone]  Please check AMION for all [place] numbers  10/07/2018 6:37 PM

## 2018-10-07 NOTE — Progress Notes (Signed)
Phone [phone]   Subjective:  Virtual visit via Video note. Chief complaint: Chief Complaint  Patient presents with  . Extremity Weakness    Pt fall yesterday   This visit type was conducted due to national recommendations for restrictions regarding the COVID-19 Pandemic (e.g. social distancing).  This format is felt to be most appropriate for this patient at this time balancing risks to patient and risks to population by having him in for in person visit.  No physical exam was performed (except for noted visual exam or audio findings with Telehealth visits).    Our team/I connected with Zachary Decker on 10/07/18 at 10:00 AM EDT by a video enabled telemedicine application ([email or web address]) and verified that I am speaking with the correct person using two identifiers.  Location patient: [place] Location provider: [facility], office Persons participating in the virtual visit:  Patient and daughter- helps provide hisotry  Our team/I discussed the limitations of evaluation and management by telemedicine and the availability of in person appointments. In light of current covid-19 pandemic, patient also understands that we are trying to protect them by minimizing in office contact if at all possible.  The patient expressed consent for telemedicine visit and agreed to proceed. Patient understands insurance will be billed.   ROS- no chest pain or shrotness of breath reported. Reports generalized weakness but feels like left leg likely the weakest  Past Medical History-  Patient Active Problem List   Diagnosis Date Noted  . Balance disorder 04/13/2018    Priority: High  . CAD w/ hx MI s/p CABG 09/07/2012    Priority: High  . Cardiomyopathy, ischemic 03/22/2010    Priority: High  . Low back pain 04/19/2015    Priority: Medium  . CKD (chronic kidney disease), stage III ([place]) 04/20/2014    Priority: Medium  . Anxiety state 04/17/2014    Priority: Medium  . HYPERCHOLESTEROLEMIA 10/31/2008   Priority: Medium  . BPH (benign prostatic hyperplasia) 09/28/2007    Priority: Medium  . Hypothyroidism 03/26/2007    Priority: Medium  . Essential hypertension 03/22/2007    Priority: Medium  . Senile purpura ([place]) 04/13/2018    Priority: Low  . ANEMIA, VITAMIN B12 DEFICIENCY 04/22/2010    Priority: Low  . Actinic keratosis 06/25/2009    Priority: Low  . BASAL CELL CARCINOMA, NOSE 02/05/2009    Priority: Low  . Localized osteoarthrosis, lower leg 12/06/2008    Priority: Low  . GERD 03/22/2007    Priority: Low  . Insomnia 04/13/2018    Medications- reviewed and updated Current Outpatient Medications  Medication Sig Dispense Refill  . Acetaminophen (TYLENOL EXTRA STRENGTH PO) Take 1-2 tablets by mouth daily as needed (for back pain).    Marland Kitchen aspirin 81 MG tablet Take 81 mg by mouth daily.      . B Complex-C-Folic Acid (FOLBEE PLUS) TABS TAKE 1 TABLET BY MOUTH EVERY DAY 90 tablet 1  . carvedilol (COREG) 3.125 MG tablet TAKE 1 TABLET BY MOUTH 2 TIMES A DAY WITH MEAL 180 tablet 3  . diazepam (VALIUM) 2 MG tablet Take 1 tablet (2 mg total) by mouth at bedtime as needed. For anxiety 22 tablet 0  . diclofenac sodium (VOLTAREN) 1 % GEL APPLY 2 GRAMS TO AFFECTED AREA 4 TIMES A DAY 100 g 11  . diclofenac sodium (VOLTAREN) 1 % GEL APPLY 2 GRAMS TO AFFECTED AREA 4 TIMES A DAY 100 g 0  . levothyroxine (SYNTHROID, LEVOTHROID) 50 MCG tablet TAKE 1 TABLET (50  MCG TOTAL) BY MOUTH DAILY. 90 tablet 3  . lisinopril (PRINIVIL,ZESTRIL) 2.5 MG tablet Take 1 tablet (2.5 mg total) by mouth daily. 90 tablet 3  . Melatonin (CVS MELATONIN) 5 MG TABS Take 2.5 mg by mouth at bedtime.    . nitroGLYCERIN (NITROSTAT) 0.4 MG SL tablet Place 1 tablet (0.4 mg total) under the tongue every 5 (five) minutes as needed for chest pain. 20 tablet 1  . simvastatin (ZOCOR) 20 MG tablet Take 1 tablet (20 mg total) by mouth at bedtime. 90 tablet 3  . tamsulosin (FLOMAX) 0.4 MG CAPS capsule TAKE 1 CAPSULE (0.4 MG TOTAL) BY MOUTH  DAILY. 90 capsule 3  . ranitidine (ZANTAC) 300 MG tablet TAKE 1 TABLET BY MOUTH EVERYDAY AT BEDTIME (Patient not taking: Reported on 10/07/2018) 90 tablet 3   No current facility-administered medications for this visit.      Objective:  Pulse 95   Ht 6\' 2"  (1.88 m)   Wt 185 lb (83.9 kg)   BMI 23.75 kg/m  Gen: NAD, appears abnormally fatigued Lungs: nonlabored, normal respiratory rate  Skin: appears dry, no obvious rash Slouched over in chair (this is baseline posture) Does not engage in conversation as much as usual- daughter engages more than he does    Assessment and Plan   # Generalized weakness  Left leg weakness  S:yesterday got up from a nap and was walking with walker at first but then legs gave out from under him and had a fall without LOC or hitting head reported- around 3 PM.  He can stand with assist but  He really can't move much even with assist- only short distances from rolling walker to chair.  Doesn't seem like his brain is telling his legs to move per him. He walks with a walker at baseline but cannot do that at present. Feels like left leg may be a little weaker.  More tired than usually is. Was very lethargic and tired yesterday. Not as engaged in conversation  Family thought he should go to ER yesterday but he declined worried about covid 12 A/P: 85 year old male with CAD, HTN, HLD on asa at baseline presenting with generalized weakness and seems to have left leg weakness in particular as well as some decreased attentiveness/engagement in conversation-I am concerned about potential stroke.  He does not report significant pain as cause of inability to stand.  Family thinks patient is too weak even to be able to go to an outpatient facility for testing.  Patient states he does not even think he can get to his car-I recommended in light of this study call 911 and be transported to the hospital.  I spoke with the charge/soort nurse at [facility] and they are aware patient  will be coming.  Patient's daughter is aware she likely will not be able to come into the emergency room unless he cannot provide adequate history due to COVID-19 pandemic. -Hypotension could play a role but unable to get blood pressure at home.  Future Appointments  Date Time Provider [place]  10/14/2018  9:40 AM [name], MD [facility]  10/29/2018  4:00 PM Burnell Blanks, MD [facility]  04/19/2019  1:00 PM [facility] HEALTH COACH [facility]  04/19/2019  2:20 PM [name], MD [facility]   Lab/Order associations: Generalized weakness  Left leg weakness  Return precautions advised.  Garret Reddish, MD

## 2018-10-07 NOTE — ED Provider Notes (Signed)
[facility] EMERGENCY DEPARTMENT Provider Note   CSN: [id number] Arrival date & time: 10/07/18  1137    History   Chief Complaint Chief Complaint  Patient presents with   Fall   Weakness    HPI Zachary Decker is a 85 y.o. male.     Patient c/o fall yesterday at home. States his bilateral legs got weak and gave way. Denies loc. Denies head injury. No nv. States since fall has remained feeling generally weak and that today he has been unable to walk w his walker. Denies unilateral numbness/weakness. No change in speech or vision. No fevers or chills. Denies cough or uri symptoms. No nvd.  No gu c/o. No weight loss. ?decreased po intake. Denies known ill contacts.   The history is provided by the patient and the EMS personnel.  Fall  Pertinent negatives include no chest pain, no abdominal pain, no headaches and no shortness of breath.  Weakness  Associated symptoms: no abdominal pain, no chest pain, no diarrhea, no dysuria, no fever, no headaches, no shortness of breath and no vomiting     Past Medical History:  Diagnosis Date   Acute myocardial infarction of inferoposterior wall, subsequent episode of care [facility])    CAD (coronary artery disease)    Encounter for long-term (current) use of other medications    Family history of malignant neoplasm of gastrointestinal tract    GERD (gastroesophageal reflux disease)    Hypercholesterolemia    Hypertension    Hypertrophy of prostate with urinary obstruction and other lower urinary tract symptoms (LUTS)    Personal history of thrombophlebitis    PVD (peripheral vascular disease) (HCC)    Respiratory failure (HCC)    Unspecified adverse effect of unspecified drug, medicinal and biological substance    Unspecified hypothyroidism     Patient Active Problem List   Diagnosis Date Noted   Balance disorder 04/13/2018   Senile purpura ([place]) 04/13/2018   Insomnia 04/13/2018   Low back pain  04/19/2015   CKD (chronic kidney disease), stage III ([place]) 04/20/2014   Anxiety state 04/17/2014   CAD w/ hx MI s/p CABG 09/07/2012   ANEMIA, VITAMIN B12 DEFICIENCY 04/22/2010   Cardiomyopathy, ischemic 03/22/2010   Actinic keratosis 06/25/2009   BASAL CELL CARCINOMA, NOSE 02/05/2009   Localized osteoarthrosis, lower leg 12/06/2008   HYPERCHOLESTEROLEMIA 10/31/2008   BPH (benign prostatic hyperplasia) 09/28/2007   Hypothyroidism 03/26/2007   Essential hypertension 03/22/2007   GERD 03/22/2007    Past Surgical History:  Procedure Laterality Date   CHOLECYSTECTOMY  02/26/2012   Procedure: LAPAROSCOPIC CHOLECYSTECTOMY WITH INTRAOPERATIVE CHOLANGIOGRAM;  Surgeon: Madilyn Hook, DO;  Location: [place];  Service: General;  Laterality: N/A;   CORONARY ARTERY BYPASS GRAFT     x 5 on August 29, 2008   KNEE SURGERY     tachecostomy placement     after heart attack   TONSILLECTOMY          Home Medications    Prior to Admission medications   Medication Sig Start Date End Date Taking? Authorizing Provider  Acetaminophen (TYLENOL EXTRA STRENGTH PO) Take 1-2 tablets by mouth daily as needed (for back pain).    [provider]  aspirin 81 MG tablet Take 81 mg by mouth daily.      [provider]  B Complex-C-Folic Acid (FOLBEE PLUS) TABS TAKE 1 TABLET BY MOUTH EVERY DAY 05/10/18   [name], MD  carvedilol (COREG) 3.125 MG tablet TAKE 1 TABLET  BY MOUTH 2 TIMES A DAY WITH MEAL 04/13/18   [name], MD  diazepam (VALIUM) 2 MG tablet Take 1 tablet (2 mg total) by mouth at bedtime as needed. For anxiety 04/13/18   [name], MD  diclofenac sodium (VOLTAREN) 1 % GEL APPLY 2 GRAMS TO AFFECTED AREA 4 TIMES A DAY 04/13/18   [name], MD  diclofenac sodium (VOLTAREN) 1 % GEL APPLY 2 GRAMS TO AFFECTED AREA 4 TIMES A DAY 05/11/18   [name], MD  levothyroxine (SYNTHROID, LEVOTHROID) 50 MCG tablet TAKE 1 TABLET (50 MCG TOTAL) BY  MOUTH DAILY. 11/20/17   [name], MD  lisinopril (PRINIVIL,ZESTRIL) 2.5 MG tablet Take 1 tablet (2.5 mg total) by mouth daily. 04/13/18   [name], MD  Melatonin ([facility] MELATONIN) 5 MG TABS Take 2.5 mg by mouth at bedtime.    [provider]  nitroGLYCERIN (NITROSTAT) 0.4 MG SL tablet Place 1 tablet (0.4 mg total) under the tongue every 5 (five) minutes as needed for chest pain. 04/13/18   [name], MD  ranitidine (ZANTAC) 300 MG tablet TAKE 1 TABLET BY MOUTH EVERYDAY AT BEDTIME Patient not taking: Reported on 10/07/2018 07/22/18   [name], MD  simvastatin (ZOCOR) 20 MG tablet Take 1 tablet (20 mg total) by mouth at bedtime. 04/13/18   [name], MD  tamsulosin (FLOMAX) 0.4 MG CAPS capsule TAKE 1 CAPSULE (0.4 MG TOTAL) BY MOUTH DAILY. 04/13/18   [name], MD    Family History Family History  Problem Relation Age of Onset   Heart disease Mother    Tuberculosis Father     Social History Social History   Tobacco Use   Smoking status: Never Smoker   Smokeless tobacco: Never Used  Substance Use Topics   Alcohol use: No   Drug use: No     Allergies   Losartan potassium; Naproxen; and Penicillins   Review of Systems Review of Systems  Constitutional: Negative for fever.  HENT: Negative for sore throat.   Eyes: Negative for redness and visual disturbance.  Respiratory: Negative for shortness of breath.   Cardiovascular: Negative for chest pain.  Gastrointestinal: Negative for abdominal pain, blood in stool, diarrhea and vomiting.  Endocrine: Negative for polyuria.  Genitourinary: Negative for dysuria and flank pain.  Musculoskeletal: Negative for back pain and neck pain.  Skin: Negative for rash.  Neurological: Positive for weakness. Negative for speech difficulty, numbness and headaches.  Hematological: Does not bruise/bleed easily.  Psychiatric/Behavioral: Negative for confusion.     Physical Exam Updated  Vital Signs BP (!) 112/50    Pulse (!) 57    Temp 97.9 F (36.6 C) (Oral)    Resp (!) 21    Ht 1.88 m (6\' 2" )    Wt 83.9 kg    SpO2 98%    BMI 23.75 kg/m   Physical Exam Vitals signs and nursing note reviewed.  Constitutional:      Appearance: Normal appearance. He is well-developed.  HENT:     Head: Atraumatic.     Nose: Nose normal.     Mouth/Throat:     Mouth: Mucous membranes are moist.     Pharynx: Oropharynx is clear.  Eyes:     General: No scleral icterus.    Conjunctiva/sclera: Conjunctivae normal.     Pupils: Pupils are equal, round, and reactive to light.  Neck:     Musculoskeletal: Normal range of motion and neck supple.  No neck rigidity.     Vascular: No carotid bruit.     Trachea: No tracheal deviation.  Cardiovascular:     Rate and Rhythm: Normal rate and regular rhythm.     Pulses: Normal pulses.     Heart sounds: Normal heart sounds. No murmur. No friction rub. No gallop.   Pulmonary:     Effort: Pulmonary effort is normal. No accessory muscle usage or respiratory distress.     Breath sounds: Normal breath sounds.  Abdominal:     General: Bowel sounds are normal. There is no distension.     Palpations: Abdomen is soft.     Tenderness: There is no abdominal tenderness. There is no guarding.  Genitourinary:    Comments: No cva tenderness. Musculoskeletal:        General: No swelling.     Right lower leg: No edema.     Left lower leg: No edema.     Comments: Mid cervical tenderness, otherwise, CTLS spine, non tender, aligned, no step off. Good rom bil ext, no focal pain or bony tenderness.   Skin:    General: Skin is warm and dry.     Findings: No rash.     Comments: Erythema to anterior left lower leg and foot - erythema extends to proximal thigh medially. Pt denies pain to area. No crepitus.   Neurological:     Mental Status: He is alert.     Cranial Nerves: No cranial nerve deficit.     Comments: Alert, speech clear/fluent. Motor intact bil, stre 5/5.  sens grossly intact bil. No pronator drift.   Psychiatric:        Mood and Affect: Mood normal.      ED Treatments / Results  Labs (all labs ordered are listed, but only abnormal results are displayed) Results for orders placed or performed during the hospital encounter of 10/07/18  CBC  Result Value Ref Range   WBC 30.4 (H) 4.0 - 10.5 K/uL   RBC 3.28 (L) 4.22 - 5.81 MIL/uL   Hemoglobin 11.4 (L) 13.0 - 17.0 g/dL   HCT 33.0 (L) 39.0 - 52.0 %   MCV 100.6 (H) 80.0 - 100.0 fL   MCH 34.8 (H) 26.0 - 34.0 pg   MCHC 34.5 30.0 - 36.0 g/dL   RDW 12.6 11.5 - 15.5 %   Platelets 179 150 - 400 K/uL   nRBC 0.0 0.0 - 0.2 %  Comprehensive metabolic panel  Result Value Ref Range   Sodium 135 135 - 145 mmol/L   Potassium 4.3 3.5 - 5.1 mmol/L   Chloride 103 98 - 111 mmol/L   CO2 23 22 - 32 mmol/L   Glucose, Bld 124 (H) 70 - 99 mg/dL   BUN 39 (H) 8 - 23 mg/dL   Creatinine, Ser 1.39 (H) 0.61 - 1.24 mg/dL   Calcium 8.9 8.9 - 10.3 mg/dL   Total Protein 6.2 (L) 6.5 - 8.1 g/dL   Albumin 3.3 (L) 3.5 - 5.0 g/dL   AST 47 (H) 15 - 41 U/L   ALT 28 0 - 44 U/L   Alkaline Phosphatase 37 (L) 38 - 126 U/L   Total Bilirubin 1.0 0.3 - 1.2 mg/dL   GFR calc non Af Amer 47 (L) >60 mL/min   GFR calc Af Amer 54 (L) >60 mL/min   Anion gap 9 5 - 15  Urinalysis, Routine w reflex microscopic  Result Value Ref Range   Color, Urine YELLOW YELLOW   APPearance CLEAR  CLEAR   Specific Gravity, Urine 1.025 1.005 - 1.030   pH 5.0 5.0 - 8.0   Glucose, UA NEGATIVE NEGATIVE mg/dL   Hgb urine dipstick NEGATIVE NEGATIVE   Bilirubin Urine NEGATIVE NEGATIVE   Ketones, ur NEGATIVE NEGATIVE mg/dL   Protein, ur NEGATIVE NEGATIVE mg/dL   Nitrite NEGATIVE NEGATIVE   Leukocytes,Ua NEGATIVE NEGATIVE  Lactic acid, plasma  Result Value Ref Range   Lactic Acid, Venous 1.1 0.5 - 1.9 mmol/L   Ct Head Wo Contrast  Result Date: 10/07/2018 CLINICAL DATA:  Fall yesterday. EXAM: CT HEAD WITHOUT CONTRAST CT CERVICAL SPINE WITHOUT  CONTRAST TECHNIQUE: Multidetector CT imaging of the head and cervical spine was performed following the standard protocol without intravenous contrast. Multiplanar CT image reconstructions of the cervical spine were also generated. COMPARISON:  None. FINDINGS: CT HEAD FINDINGS Brain: No evidence of acute infarction, hemorrhage, hydrocephalus, extra-axial collection or mass lesion/mass effect. Mild generalized cerebral atrophy. Scattered mild periventricular and subcortical white matter hypodensities are nonspecific, but favored to reflect chronic microvascular ischemic changes. Vascular: Calcified atherosclerosis at the skullbase. No hyperdense vessel. Skull: Normal. Negative for fracture or focal lesion. Sinuses/Orbits: No acute finding. Other: None. CT CERVICAL SPINE FINDINGS Alignment: No traumatic malalignment. Facet mediated 4 mm anterolisthesis at C5-C6 and 3 mm anterolisthesis at C7-T1. Skull base and vertebrae: No acute fracture. No primary bone lesion or focal pathologic process. Soft tissues and spinal canal: No prevertebral fluid or swelling. No visible canal hematoma. Disc levels:  Diffuse moderate multilevel cervical spondylosis. Upper chest: Negative. Other: None. IMPRESSION: 1. No acute intracranial abnormality. Mild atrophy and chronic microvascular ischemic changes. 2. No acute cervical spine fracture. Moderate cervical spondylosis. Electronically Signed   By: Titus Dubin M.D.   On: 10/07/2018 14:36   Ct Cervical Spine Wo Contrast  Result Date: 10/07/2018 CLINICAL DATA:  Fall yesterday. EXAM: CT HEAD WITHOUT CONTRAST CT CERVICAL SPINE WITHOUT CONTRAST TECHNIQUE: Multidetector CT imaging of the head and cervical spine was performed following the standard protocol without intravenous contrast. Multiplanar CT image reconstructions of the cervical spine were also generated. COMPARISON:  None. FINDINGS: CT HEAD FINDINGS Brain: No evidence of acute infarction, hemorrhage, hydrocephalus,  extra-axial collection or mass lesion/mass effect. Mild generalized cerebral atrophy. Scattered mild periventricular and subcortical white matter hypodensities are nonspecific, but favored to reflect chronic microvascular ischemic changes. Vascular: Calcified atherosclerosis at the skullbase. No hyperdense vessel. Skull: Normal. Negative for fracture or focal lesion. Sinuses/Orbits: No acute finding. Other: None. CT CERVICAL SPINE FINDINGS Alignment: No traumatic malalignment. Facet mediated 4 mm anterolisthesis at C5-C6 and 3 mm anterolisthesis at C7-T1. Skull base and vertebrae: No acute fracture. No primary bone lesion or focal pathologic process. Soft tissues and spinal canal: No prevertebral fluid or swelling. No visible canal hematoma. Disc levels:  Diffuse moderate multilevel cervical spondylosis. Upper chest: Negative. Other: None. IMPRESSION: 1. No acute intracranial abnormality. Mild atrophy and chronic microvascular ischemic changes. 2. No acute cervical spine fracture. Moderate cervical spondylosis. Electronically Signed   By: Titus Dubin M.D.   On: 10/07/2018 14:36   Dg Chest Port 1 View  Result Date: 10/07/2018 CLINICAL DATA:  Generalized weakness.  Hypertension. EXAM: PORTABLE CHEST 1 VIEW COMPARISON:  Oct 29, 2009 FINDINGS: A portion of the right apex is not appreciated due to overlying of patient mandible. There is scarring in the right base with chronic blunting of the right costophrenic angle. There is no evident edema or consolidation. The heart size and pulmonary vascularity are normal. No adenopathy. Patient  is status post coronary artery bypass grafting. There is aortic atherosclerosis. No bone lesions. IMPRESSION: Chronic scarring right base. No edema or consolidation. Note that right apex is partially obscured by the patient's mandible. Heart size normal. Status post coronary artery bypass grafting. Aortic Atherosclerosis (ICD10-I70.0). Electronically Signed   By: Lowella Grip  III M.D.   On: 10/07/2018 14:15    EKG EKG Interpretation  Date/Time:  Thursday October 07 2018 11:55:26 EDT Ventricular Rate:  65 PR Interval:    QRS Duration: 136 QT Interval:  440 QTC Calculation: 458 R Axis:   33 Text Interpretation:  Sinus rhythm Nonspecific T wave abnormality Poor data quality Confirmed by Lajean Saver [phone]) on 10/07/2018 12:27:32 PM   Radiology Ct Head Wo Contrast  Result Date: 10/07/2018 CLINICAL DATA:  Fall yesterday. EXAM: CT HEAD WITHOUT CONTRAST CT CERVICAL SPINE WITHOUT CONTRAST TECHNIQUE: Multidetector CT imaging of the head and cervical spine was performed following the standard protocol without intravenous contrast. Multiplanar CT image reconstructions of the cervical spine were also generated. COMPARISON:  None. FINDINGS: CT HEAD FINDINGS Brain: No evidence of acute infarction, hemorrhage, hydrocephalus, extra-axial collection or mass lesion/mass effect. Mild generalized cerebral atrophy. Scattered mild periventricular and subcortical white matter hypodensities are nonspecific, but favored to reflect chronic microvascular ischemic changes. Vascular: Calcified atherosclerosis at the skullbase. No hyperdense vessel. Skull: Normal. Negative for fracture or focal lesion. Sinuses/Orbits: No acute finding. Other: None. CT CERVICAL SPINE FINDINGS Alignment: No traumatic malalignment. Facet mediated 4 mm anterolisthesis at C5-C6 and 3 mm anterolisthesis at C7-T1. Skull base and vertebrae: No acute fracture. No primary bone lesion or focal pathologic process. Soft tissues and spinal canal: No prevertebral fluid or swelling. No visible canal hematoma. Disc levels:  Diffuse moderate multilevel cervical spondylosis. Upper chest: Negative. Other: None. IMPRESSION: 1. No acute intracranial abnormality. Mild atrophy and chronic microvascular ischemic changes. 2. No acute cervical spine fracture. Moderate cervical spondylosis. Electronically Signed   By: Titus Dubin M.D.    On: 10/07/2018 14:36   Ct Cervical Spine Wo Contrast  Result Date: 10/07/2018 CLINICAL DATA:  Fall yesterday. EXAM: CT HEAD WITHOUT CONTRAST CT CERVICAL SPINE WITHOUT CONTRAST TECHNIQUE: Multidetector CT imaging of the head and cervical spine was performed following the standard protocol without intravenous contrast. Multiplanar CT image reconstructions of the cervical spine were also generated. COMPARISON:  None. FINDINGS: CT HEAD FINDINGS Brain: No evidence of acute infarction, hemorrhage, hydrocephalus, extra-axial collection or mass lesion/mass effect. Mild generalized cerebral atrophy. Scattered mild periventricular and subcortical white matter hypodensities are nonspecific, but favored to reflect chronic microvascular ischemic changes. Vascular: Calcified atherosclerosis at the skullbase. No hyperdense vessel. Skull: Normal. Negative for fracture or focal lesion. Sinuses/Orbits: No acute finding. Other: None. CT CERVICAL SPINE FINDINGS Alignment: No traumatic malalignment. Facet mediated 4 mm anterolisthesis at C5-C6 and 3 mm anterolisthesis at C7-T1. Skull base and vertebrae: No acute fracture. No primary bone lesion or focal pathologic process. Soft tissues and spinal canal: No prevertebral fluid or swelling. No visible canal hematoma. Disc levels:  Diffuse moderate multilevel cervical spondylosis. Upper chest: Negative. Other: None. IMPRESSION: 1. No acute intracranial abnormality. Mild atrophy and chronic microvascular ischemic changes. 2. No acute cervical spine fracture. Moderate cervical spondylosis. Electronically Signed   By: Titus Dubin M.D.   On: 10/07/2018 14:36   Dg Chest Port 1 View  Result Date: 10/07/2018 CLINICAL DATA:  Generalized weakness.  Hypertension. EXAM: PORTABLE CHEST 1 VIEW COMPARISON:  Oct 29, 2009 FINDINGS: A portion of the right apex  is not appreciated due to overlying of patient mandible. There is scarring in the right base with chronic blunting of the right  costophrenic angle. There is no evident edema or consolidation. The heart size and pulmonary vascularity are normal. No adenopathy. Patient is status post coronary artery bypass grafting. There is aortic atherosclerosis. No bone lesions. IMPRESSION: Chronic scarring right base. No edema or consolidation. Note that right apex is partially obscured by the patient's mandible. Heart size normal. Status post coronary artery bypass grafting. Aortic Atherosclerosis (ICD10-I70.0). Electronically Signed   By: Lowella Grip III M.D.   On: 10/07/2018 14:15    Procedures Procedures (including critical care time)  Medications Ordered in ED Medications - No data to display   Initial Impression / Assessment and Plan / ED Course  I have reviewed the triage vital signs and the nursing notes.  Pertinent labs & imaging results that were available during my care of the patient were reviewed by me and considered in my medical decision making (see chart for details).  Iv ns. Labs.   Reviewed nursing notes and prior charts for additional history.   Labs reviewed by me - chem normal. Wbc markedly elevated. Will add lactate and cultures.   Lactate normal.   cxr reviewed - no pna.   CT reviewed by me - no acute trauma/hem.  Recheck pt, no new c/o. bp improved. Review of prior visits - bp in low 100s then.   Iv antibiotics for cellulitis given in ED.   Hospitalists consulted for admission.    Final Clinical Impressions(s) / ED Diagnoses   Final diagnoses:  None    ED Discharge Orders    None       Lajean Saver, MD 10/07/18 [phone]

## 2018-10-07 NOTE — Patient Instructions (Signed)
Video visit

## 2018-10-07 NOTE — ED Notes (Signed)
Culture sent in addition to UA 

## 2018-10-07 NOTE — Progress Notes (Signed)
Patient arrived in the unit at 2040 pm from ED, alert and oriented x4, denied of pain, resting in a bed comfortably, all safety and comfort measures are in placed, will continue to monitor closely.

## 2018-10-08 DIAGNOSIS — R531 Weakness: Secondary | ICD-10-CM

## 2018-10-08 LAB — COMPREHENSIVE METABOLIC PANEL
ALT: 26 U/L (ref 0–44)
AST: 79 U/L — ABNORMAL HIGH (ref 15–41)
Albumin: 2.9 g/dL — ABNORMAL LOW (ref 3.5–5.0)
Alkaline Phosphatase: 37 U/L — ABNORMAL LOW (ref 38–126)
Anion gap: 10 (ref 5–15)
BUN: 38 mg/dL — ABNORMAL HIGH (ref 8–23)
CO2: 21 mmol/L — ABNORMAL LOW (ref 22–32)
Calcium: 8.7 mg/dL — ABNORMAL LOW (ref 8.9–10.3)
Chloride: 106 mmol/L (ref 98–111)
Creatinine, Ser: 1.3 mg/dL — ABNORMAL HIGH (ref 0.61–1.24)
GFR calc Af Amer: 58 mL/min — ABNORMAL LOW (ref >60)
GFR calc non Af Amer: 50 mL/min — ABNORMAL LOW (ref >60)
Glucose, Bld: 92 mg/dL (ref 70–99)
Potassium: 4.7 mmol/L (ref 3.5–5.1)
Sodium: 137 mmol/L (ref 135–145)
Total Bilirubin: 1.1 mg/dL (ref 0.3–1.2)
Total Protein: 5.5 g/dL — ABNORMAL LOW (ref 6.5–8.1)

## 2018-10-08 LAB — CBC
HCT: 32.9 % — ABNORMAL LOW (ref 39.0–52.0)
Hemoglobin: 11.1 g/dL — ABNORMAL LOW (ref 13.0–17.0)
MCH: 34.3 pg — ABNORMAL HIGH (ref 26.0–34.0)
MCHC: 33.7 g/dL (ref 30.0–36.0)
MCV: 101.5 fL — ABNORMAL HIGH (ref 80.0–100.0)
Platelets: 164 10*3/uL (ref 150–400)
RBC: 3.24 MIL/uL — ABNORMAL LOW (ref 4.22–5.81)
RDW: 12.9 % (ref 11.5–15.5)
WBC: 19.9 10*3/uL — ABNORMAL HIGH (ref 4.0–10.5)
nRBC: 0 % (ref 0.0–0.2)

## 2018-10-08 MED ORDER — SODIUM CHLORIDE 0.9 % IV SOLN
1.0000 g | INTRAVENOUS | Status: DC
  Administered 2018-10-08 – 2018-10-09 (×2): 1 g via INTRAVENOUS
  Filled 2018-10-08 (×2): qty 10

## 2018-10-08 MED ORDER — SODIUM CHLORIDE 0.9 % IV SOLN
INTRAVENOUS | Status: DC
  Administered 2018-10-08: 09:00:00 via INTRAVENOUS

## 2018-10-08 NOTE — Progress Notes (Addendum)
PROGRESS NOTE    Zachary Decker  OYD:[id number] DOB: 08/27/1932 DOA: 10/07/2018 PCP: [name], MD  Brief Narrative 85 year old Caucasian male with past medical history significant for coronary artery disease/MI, hypertension, hyperlipidemia, BPH , PVD and respiratory failure.  Patient also has history of thrombophlebitis.  Patient is a poor historian.  Patient presented to the hospital feeling weak.  Apparently, patient was also said to have fallen at home.  Imaging studies following the fall was nonrevealing.  However, WBC was noted to be 30,000.  Left leg cellulitis/redness is noted   Assessment & Plan:   Sepsis/cellulitis of LLE -Admitted last night with generalized weakness, cellulitis of left lower extremity, leukocytosis of 30,000 -BP dropped to 46mmHg -Slow clinical improvement, not back to baseline yet -Currently on vancomycin and change cefepime to ceftriaxone -continue IVF today -FU cultures -Physical therapy eval  Macrocytic anemia -monitor Hb -B12 WNL -no plan for further workup inpatient  History of CAD -Aspirin, beta-blocker  History of BPH -Continue Flomax  Hypothyroidism -Continue Synthroid  DVT prophylaxis: Lovenox Code Status: Full code Family Communication: No family at bedside, will update wife later today Disposition Plan: Home pending clinical improvement  Consultants:      Procedures:   Antimicrobials:    Subjective: -Is better, still a little weak and tired  Objective: Vitals:   10/08/18 0049 10/08/18 0427 10/08/18 0749 10/08/18 1202  BP: (!) 82/52 97/68 (!) 96/58 100/70  Pulse: 77 73 68 60  Resp: 17 16 19 15   Temp: 98.9 F (37.2 C) 98.9 F (37.2 C) 98.4 F (36.9 C) 98.1 F (36.7 C)  TempSrc: Oral Oral Oral Oral  SpO2: 95% 96% 98% 94%  Weight:      Height:        Intake/Output Summary (Last 24 hours) at 10/08/2018 1239 Last data filed at 10/08/2018 1207 Gross per 24 hour  Intake 400 ml  Output 601 ml  Net -201  ml   Filed Weights   10/07/18 1208  Weight: 83.9 kg    Examination:  General exam: Appears calm and comfortable chronically ill-appearing Respiratory system: Clear to auscultation Cardiovascular system: S1 & S2 heard, RRR  Gastrointestinal system: Abdomen is nondistended, soft and nontender.Normal bowel sounds heard. Central nervous system: Alert and oriented. No focal neurological deficits. Extremities: Lower extremity with swelling redness and tenderness Skin: No rashes, lesions or ulcers Psychiatry: Flat affect    Data Reviewed:   CBC: Recent Labs  Lab 10/07/18 1224 10/08/18 0910  WBC 30.4* 19.9*  HGB 11.4* 11.1*  HCT 33.0* 32.9*  MCV 100.6* 101.5*  PLT 179 672   Basic Metabolic Panel: Recent Labs  Lab 10/07/18 1224 10/08/18 0910  NA 135 137  K 4.3 4.7  CL 103 106  CO2 23 21*  GLUCOSE 124* 92  BUN 39* 38*  CREATININE 1.39* 1.30*  CALCIUM 8.9 8.7*   GFR: Estimated Creatinine Clearance: 50.1 mL/min (A) (by C-G formula based on SCr of 1.3 mg/dL (H)). Liver Function Tests: Recent Labs  Lab 10/07/18 1224 10/08/18 0910  AST 47* 79*  ALT 28 26  ALKPHOS 37* 37*  BILITOT 1.0 1.1  PROT 6.2* 5.5*  ALBUMIN 3.3* 2.9*   No results for input(s): LIPASE, AMYLASE in the last 168 hours. No results for input(s): AMMONIA in the last 168 hours. Coagulation Profile: No results for input(s): INR, PROTIME in the last 168 hours. Cardiac Enzymes: No results for input(s): CKTOTAL, CKMB, CKMBINDEX, TROPONINI in the last 168 hours. BNP (last 3 results)  No results for input(s): PROBNP in the last 8760 hours. HbA1C: No results for input(s): HGBA1C in the last 72 hours. CBG: No results for input(s): GLUCAP in the last 168 hours. Lipid Profile: No results for input(s): CHOL, HDL, LDLCALC, TRIG, CHOLHDL, LDLDIRECT in the last 72 hours. Thyroid Function Tests: No results for input(s): TSH, T4TOTAL, FREET4, T3FREE, THYROIDAB in the last 72 hours. Anemia Panel: Recent Labs     10/07/18 1954  VITAMINB12 1,340*   Urine analysis:    Component Value Date/Time   COLORURINE YELLOW 10/07/2018 1700   APPEARANCEUR CLEAR 10/07/2018 1700   LABSPEC 1.025 10/07/2018 1700   PHURINE 5.0 10/07/2018 1700   GLUCOSEU NEGATIVE 10/07/2018 1700   HGBUR NEGATIVE 10/07/2018 1700   BILIRUBINUR NEGATIVE 10/07/2018 1700   BILIRUBINUR n 08/10/2012 1612   KETONESUR NEGATIVE 10/07/2018 1700   PROTEINUR NEGATIVE 10/07/2018 1700   UROBILINOGEN 0.2 08/10/2012 1612   UROBILINOGEN 0.2 02/24/2012 0133   NITRITE NEGATIVE 10/07/2018 1700   LEUKOCYTESUR NEGATIVE 10/07/2018 1700   Sepsis Labs: @LABRCNTIP (procalcitonin:4,lacticidven:4)  ) Recent Results (from the past 240 hour(s))  Blood Culture (routine x 2)     Status: None (Preliminary result)   Collection Time: 10/07/18  1:41 PM  Result Value Ref Range Status   Specimen Description BLOOD SITE NOT SPECIFIED  Final   Special Requests   Final    BOTTLES DRAWN AEROBIC AND ANAEROBIC Blood Culture results may not be optimal due to an excessive volume of blood received in culture bottles   Culture   Final    NO GROWTH < 24 HOURS Performed at [facility], [place] [address], [place], [place] [address]    Report Status PENDING  Incomplete  Blood Culture (routine x 2)     Status: None (Preliminary result)   Collection Time: 10/07/18  1:46 PM  Result Value Ref Range Status   Specimen Description BLOOD RIGHT ANTECUBITAL  Final   Special Requests   Final    BOTTLES DRAWN AEROBIC AND ANAEROBIC Blood Culture adequate volume   Culture   Final    NO GROWTH < 24 HOURS Performed at [facility], [place] [address] [place], [place] [address]    Report Status PENDING  Incomplete         Radiology Studies: Ct Head Wo Contrast  Result Date: 10/07/2018 CLINICAL DATA:  Fall yesterday. EXAM: CT HEAD WITHOUT CONTRAST CT CERVICAL SPINE WITHOUT CONTRAST TECHNIQUE: Multidetector CT imaging of the head and cervical spine was performed  following the standard protocol without intravenous contrast. Multiplanar CT image reconstructions of the cervical spine were also generated. COMPARISON:  None. FINDINGS: CT HEAD FINDINGS Brain: No evidence of acute infarction, hemorrhage, hydrocephalus, extra-axial collection or mass lesion/mass effect. Mild generalized cerebral atrophy. Scattered mild periventricular and subcortical white matter hypodensities are nonspecific, but favored to reflect chronic microvascular ischemic changes. Vascular: Calcified atherosclerosis at the skullbase. No hyperdense vessel. Skull: Normal. Negative for fracture or focal lesion. Sinuses/Orbits: No acute finding. Other: None. CT CERVICAL SPINE FINDINGS Alignment: No traumatic malalignment. Facet mediated 4 mm anterolisthesis at C5-C6 and 3 mm anterolisthesis at C7-T1. Skull base and vertebrae: No acute fracture. No primary bone lesion or focal pathologic process. Soft tissues and spinal canal: No prevertebral fluid or swelling. No visible canal hematoma. Disc levels:  Diffuse moderate multilevel cervical spondylosis. Upper chest: Negative. Other: None. IMPRESSION: 1. No acute intracranial abnormality. Mild atrophy and chronic microvascular ischemic changes. 2. No acute cervical spine fracture. Moderate cervical spondylosis. Electronically  Signed   By: Titus Dubin M.D.   On: 10/07/2018 14:36   Ct Cervical Spine Wo Contrast  Result Date: 10/07/2018 CLINICAL DATA:  Fall yesterday. EXAM: CT HEAD WITHOUT CONTRAST CT CERVICAL SPINE WITHOUT CONTRAST TECHNIQUE: Multidetector CT imaging of the head and cervical spine was performed following the standard protocol without intravenous contrast. Multiplanar CT image reconstructions of the cervical spine were also generated. COMPARISON:  None. FINDINGS: CT HEAD FINDINGS Brain: No evidence of acute infarction, hemorrhage, hydrocephalus, extra-axial collection or mass lesion/mass effect. Mild generalized cerebral atrophy. Scattered mild  periventricular and subcortical white matter hypodensities are nonspecific, but favored to reflect chronic microvascular ischemic changes. Vascular: Calcified atherosclerosis at the skullbase. No hyperdense vessel. Skull: Normal. Negative for fracture or focal lesion. Sinuses/Orbits: No acute finding. Other: None. CT CERVICAL SPINE FINDINGS Alignment: No traumatic malalignment. Facet mediated 4 mm anterolisthesis at C5-C6 and 3 mm anterolisthesis at C7-T1. Skull base and vertebrae: No acute fracture. No primary bone lesion or focal pathologic process. Soft tissues and spinal canal: No prevertebral fluid or swelling. No visible canal hematoma. Disc levels:  Diffuse moderate multilevel cervical spondylosis. Upper chest: Negative. Other: None. IMPRESSION: 1. No acute intracranial abnormality. Mild atrophy and chronic microvascular ischemic changes. 2. No acute cervical spine fracture. Moderate cervical spondylosis. Electronically Signed   By: Titus Dubin M.D.   On: 10/07/2018 14:36   Dg Chest Port 1 View  Result Date: 10/07/2018 CLINICAL DATA:  Generalized weakness.  Hypertension. EXAM: PORTABLE CHEST 1 VIEW COMPARISON:  Oct 29, 2009 FINDINGS: A portion of the right apex is not appreciated due to overlying of patient mandible. There is scarring in the right base with chronic blunting of the right costophrenic angle. There is no evident edema or consolidation. The heart size and pulmonary vascularity are normal. No adenopathy. Patient is status post coronary artery bypass grafting. There is aortic atherosclerosis. No bone lesions. IMPRESSION: Chronic scarring right base. No edema or consolidation. Note that right apex is partially obscured by the patient's mandible. Heart size normal. Status post coronary artery bypass grafting. Aortic Atherosclerosis (ICD10-I70.0). Electronically Signed   By: Lowella Grip III M.D.   On: 10/07/2018 14:15        Scheduled Meds: . aspirin EC  81 mg Oral Daily  .  carvedilol  3.125 mg Oral BID WC  . enoxaparin (LOVENOX) injection  40 mg Subcutaneous Q24H  . famotidine  20 mg Oral QHS  . levothyroxine  50 mcg Oral Q0600  . multivitamin with minerals  1 tablet Oral Daily  . simvastatin  20 mg Oral QHS  . tamsulosin  0.4 mg Oral QHS  . vitamin B-12  500 mcg Oral Daily   Continuous Infusions: . sodium chloride 75 mL/hr at 10/08/18 0901  . ceFEPime (MAXIPIME) IV 2 g (10/08/18 0919)  . vancomycin       LOS: 1 day    Time spent: 14min    Domenic Polite, MD [facility]   10/08/2018, 12:39 PM

## 2018-10-08 NOTE — TOC Initial Note (Signed)
Transition of Care [facility]) - Initial/Assessment Note    Patient Details  Name: Zachary Decker MRN: [id number] Date of Birth: January 29, 1933  Transition of Care [facility]) CM/SW Contact:    Pollie Friar, RN Phone Number: 10/08/2018, 1:52 PM  Clinical Narrative:                 Pt states wife currently assists him at home.   Expected Discharge Plan: Home/Self Care Barriers to Discharge: Continued Medical Work up   Patient Goals and CMS Choice        Expected Discharge Plan and Services Expected Discharge Plan: Home/Self Care       Living arrangements for the past 2 months: Single Family Home(one level)                          Prior Living Arrangements/Services Living arrangements for the past 2 months: Single Family Home(one level) Lives with:: Spouse Patient language and need for interpreter reviewed:: Yes(no needs) Do you feel safe going back to the place where you live?: Yes      Need for Family Participation in Patient Care: Yes (Comment) Care giver support system in place?: Yes (comment)(wife able to provide 24 hour supervision and assistance.) Current home services: DME(walker) Criminal Activity/Legal Involvement Pertinent to Current Situation/Hospitalization: No - Comment as needed  Activities of Daily Living      Permission Sought/Granted                  Emotional Assessment Appearance:: Appears stated age Attitude/Demeanor/Rapport: Engaged Affect (typically observed): Accepting, Pleasant, Appropriate Orientation: : Oriented to Self, Oriented to Place, Oriented to  Time, Oriented to Situation   Psych Involvement: No (comment)  Admission diagnosis:  Cellulitis of left foot [L03.116] Cellulitis of left leg [L03.116] Generalized weakness [R53.1] Leukocytosis, unspecified type [D72.829] Patient Active Problem List   Diagnosis Date Noted  . Cellulitis, leg 10/07/2018  . Balance disorder 04/13/2018  . Senile purpura ([place]) 04/13/2018  . Insomnia  04/13/2018  . Low back pain 04/19/2015  . CKD (chronic kidney disease), stage III ([place]) 04/20/2014  . Anxiety state 04/17/2014  . CAD w/ hx MI s/p CABG 09/07/2012  . ANEMIA, VITAMIN B12 DEFICIENCY 04/22/2010  . Cardiomyopathy, ischemic 03/22/2010  . Actinic keratosis 06/25/2009  . BASAL CELL CARCINOMA, NOSE 02/05/2009  . Localized osteoarthrosis, lower leg 12/06/2008  . HYPERCHOLESTEROLEMIA 10/31/2008  . BPH (benign prostatic hyperplasia) 09/28/2007  . Hypothyroidism 03/26/2007  . Essential hypertension 03/22/2007  . GERD 03/22/2007   PCP:  [name], MD Pharmacy:   [id number] - [place], [place] [address] Phone: [phone] Fax: [phone]     Social Determinants of Health (SDOH) Interventions  Pt still drives but wife can provide transportation also. Wife does his medications. He denies issues in taking them or obtaining them.  Readmission Risk Interventions No flowsheet data found.

## 2018-10-08 NOTE — Evaluation (Signed)
Physical Therapy Evaluation Patient Details Name: Zachary Decker MRN: [id number] DOB: 10/03/1932 Today's Date: 10/08/2018   History of Present Illness  Pt is an 85 y/o male admitted secondary to fall, hypotension, and L leg cellulitis. PMH includes CAD, MI, HTN, and PVD.   Clinical Impression  Pt admitted secondary to problem above with deficits below. Pt with difficulty taking steps towards chair and very weak and unsteady. Required mod to max A with use of RW to transfer to chair. Feel pt is a high fall risk and would need increased assist at d/c. Will continue to follow acutely to maximize functional mobility independence and safety.     Follow Up Recommendations SNF;Supervision/Assistance - 24 hour    Equipment Recommendations  None recommended by PT    Recommendations for Other Services       Precautions / Restrictions Precautions Precautions: Fall Restrictions Weight Bearing Restrictions: No      Mobility  Bed Mobility Overal bed mobility: Needs Assistance Bed Mobility: Supine to Sit     Supine to sit: Mod assist     General bed mobility comments: Mod A for trunk assist and assist with scooting hips to EOB.   Transfers Overall transfer level: Needs assistance Equipment used: Rolling walker (2 wheeled) Transfers: Sit to/from [facility] Sit to Stand: Mod assist;From elevated surface Stand pivot transfers: Mod assist;Max assist       General transfer comment: Mod A for lift assist and steadying to stand from elevated surface. Pt with very flexed posture and posterior lean and reqiuired cues for upright. Mod to max A to transfer to chair for steadying. Pt very slow to move and required multimodal cues to take steps towards chair.   Ambulation/Gait                Stairs            Wheelchair Mobility    Modified Rankin (Stroke Patients Only)       Balance Overall balance assessment: Needs assistance Sitting-balance support:  No upper extremity supported;Feet supported Sitting balance-Leahy Scale: Fair     Standing balance support: Bilateral upper extremity supported;During functional activity Standing balance-Leahy Scale: Poor Standing balance comment: Reliant on BUE support                             Pertinent Vitals/Pain Pain Assessment: No/denies pain    Home Living Family/patient expects to be discharged to:: Private residence Living Arrangements: Spouse/significant other Available Help at Discharge: Family;Available 24 hours/day Type of Home: House Home Access: Level entry     Home Layout: One level Home Equipment: Walker - 2 wheels;Walker - 4 wheels      Prior Function Level of Independence: Independent with assistive device(s)         Comments: Was using RW inside of home and rollator outside of home. Reports he was independent with ADL tasks.      Hand Dominance        Extremity/Trunk Assessment   Upper Extremity Assessment Upper Extremity Assessment: Generalized weakness    Lower Extremity Assessment Lower Extremity Assessment: Generalized weakness    Cervical / Trunk Assessment Cervical / Trunk Assessment: Kyphotic  Communication   Communication: No difficulties  Cognition Arousal/Alertness: Awake/alert Behavior During Therapy: WFL for tasks assessed/performed Overall Cognitive Status: No family/caregiver present to determine baseline cognitive functioning  General Comments: A&O X4, however, presents with slowed processing, especially with mobility tasks.       General Comments      Exercises     Assessment/Plan    PT Assessment Patient needs continued PT services  PT Problem List Decreased strength;Decreased balance;Decreased mobility;Decreased knowledge of use of DME;Decreased knowledge of precautions;Pain;Decreased safety awareness;Decreased cognition       PT Treatment Interventions DME  instruction;Gait training;Functional mobility training;Therapeutic exercise;Balance training;Therapeutic activities;Cognitive remediation;Patient/family education    PT Goals (Current goals can be found in the Care Plan section)  Acute Rehab PT Goals Patient Stated Goal: to be able to walk  PT Goal Formulation: With patient Time For Goal Achievement: 10/22/18 Potential to Achieve Goals: Good    Frequency Min 2X/week   Barriers to discharge        Co-evaluation               AM-PAC PT "6 Clicks" Mobility  Outcome Measure Help needed turning from your back to your side while in a flat bed without using bedrails?: A Lot Help needed moving from lying on your back to sitting on the side of a flat bed without using bedrails?: A Lot Help needed moving to and from a bed to a chair (including a wheelchair)?: Total Help needed standing up from a chair using your arms (e.g., wheelchair or bedside chair)?: A Lot Help needed to walk in hospital room?: Total Help needed climbing 3-5 steps with a railing? : Total 6 Click Score: 9    End of Session Equipment Utilized During Treatment: Gait belt Activity Tolerance: Patient tolerated treatment well Patient left: in chair;with call bell/phone within reach;with chair alarm set Nurse Communication: Mobility status PT Visit Diagnosis: Difficulty in walking, not elsewhere classified (R26.2);Unsteadiness on feet (R26.81);Muscle weakness (generalized) (M62.81)    Time: 2440-1027 PT Time Calculation (min) (ACUTE ONLY): 23 min   Charges:   PT Evaluation $PT Eval Moderate Complexity: 1 Mod PT Treatments $Therapeutic Activity: 8-22 mins        Leighton Ruff, PT, DPT  Acute Rehabilitation Services  Pager: [phone] Office: [phone]   Rudean Hitt 10/08/2018, 4:59 PM

## 2018-10-09 LAB — CBC
HCT: 32.2 % — ABNORMAL LOW (ref 39.0–52.0)
Hemoglobin: 10.5 g/dL — ABNORMAL LOW (ref 13.0–17.0)
MCH: 33.2 pg (ref 26.0–34.0)
MCHC: 32.6 g/dL (ref 30.0–36.0)
MCV: 101.9 fL — ABNORMAL HIGH (ref 80.0–100.0)
Platelets: 176 10*3/uL (ref 150–400)
RBC: 3.16 MIL/uL — ABNORMAL LOW (ref 4.22–5.81)
RDW: 12.7 % (ref 11.5–15.5)
WBC: 12.3 10*3/uL — ABNORMAL HIGH (ref 4.0–10.5)
nRBC: 0 % (ref 0.0–0.2)

## 2018-10-09 LAB — GLUCOSE, CAPILLARY
Glucose-Capillary: 76 mg/dL (ref 70–99)
Glucose-Capillary: 96 mg/dL (ref 70–99)
Glucose-Capillary: 124 mg/dL — ABNORMAL HIGH (ref 70–99)

## 2018-10-09 LAB — BASIC METABOLIC PANEL
Anion gap: 9 (ref 5–15)
BUN: 31 mg/dL — ABNORMAL HIGH (ref 8–23)
CO2: 23 mmol/L (ref 22–32)
Calcium: 8.5 mg/dL — ABNORMAL LOW (ref 8.9–10.3)
Chloride: 106 mmol/L (ref 98–111)
Creatinine, Ser: 1.21 mg/dL (ref 0.61–1.24)
GFR calc Af Amer: >60 mL/min (ref >60)
GFR calc non Af Amer: 55 mL/min — ABNORMAL LOW (ref >60)
Glucose, Bld: 92 mg/dL (ref 70–99)
Potassium: 4.2 mmol/L (ref 3.5–5.1)
Sodium: 138 mmol/L (ref 135–145)

## 2018-10-09 MED ORDER — SODIUM CHLORIDE 0.9 % IV SOLN
INTRAVENOUS | Status: AC
  Administered 2018-10-09: 09:00:00 via INTRAVENOUS

## 2018-10-09 NOTE — Progress Notes (Signed)
PROGRESS NOTE    Zachary Decker  YFV:[id number] DOB: 07-10-1932 DOA: 10/07/2018 PCP: [name], MD  Brief Narrative 85 year old Caucasian male with past medical history significant for coronary artery disease/MI, hypertension, hyperlipidemia, BPH , PVD and respiratory failure.  Patient also has history of thrombophlebitis.  Patient is a poor historian.  Patient presented to the hospital feeling weak.  Apparently, patient was also said to have fallen at home.  Imaging studies following the fall was nonrevealing.  However, WBC was noted to be 30,000.  Left leg cellulitis/redness was noted   Assessment & Plan:   Sepsis/cellulitis of LLE -Admitted with generalized weakness, cellulitis of left lower extremity, leukocytosis of 30,000 with hypotension- BP of 31mmHg -Clinically improving slowly blood pressure still remains soft -Continue vancomycin and IV ceftriaxone -Blood cultures negative -Continue IV fluids for another 10 hours -PT eval completed, SNF recommended -Social work consulted for [employer] planning  Macrocytic anemia -monitor Hb -B12 WNL -no plan for further workup inpatient  History of CAD/CABG -continue ASA/Coreg/statin  History of BPH -Continue Flomax  Hypothyroidism -Continue Synthroid  DVT prophylaxis: Lovenox Code Status: Full code Family Communication: No family at bedside, called and updated daughter Disposition Plan: Home pending clinical improvement  Consultants:      Procedures:   Antimicrobials:    Subjective: -feels better, still weak  Objective: Vitals:   10/08/18 2123 10/08/18 2328 10/09/18 0342 10/09/18 0750  BP: 117/82 134/83 116/82 (!) 95/53  Pulse: 68 64 73 77  Resp: 17 17 16 18   Temp: 97.6 F (36.4 C) 98 F (36.7 C) (!) 97.5 F (36.4 C) 98.8 F (37.1 C)  TempSrc: Oral Oral Oral Oral  SpO2: 99% 99%  97%  Weight:      Height:        Intake/Output Summary (Last 24 hours) at 10/09/2018 1119 Last data filed at 10/09/2018 0900  Gross per 24 hour  Intake 2120.03 ml  Output 750 ml  Net 1370.03 ml   Filed Weights   10/07/18 1208  Weight: 83.9 kg    Examination:  Gen: Awake, Alert, Oriented X 3, chronically ill HEENT: PERRLA, Neck supple, no JVD Lungs: CTAB CVS: RRR,No Gallops,Rubs or new Murmurs Abd: soft, Non tender, non distended, BS present Extremities: Left Lower extremity with swelling redness, warmth and tenderness Skin: as above Psychiatry: Flat affect    Data Reviewed:   CBC: Recent Labs  Lab 10/07/18 1224 10/08/18 0910 10/09/18 0444  WBC 30.4* 19.9* 12.3*  HGB 11.4* 11.1* 10.5*  HCT 33.0* 32.9* 32.2*  MCV 100.6* 101.5* 101.9*  PLT 179 164 163   Basic Metabolic Panel: Recent Labs  Lab 10/07/18 1224 10/08/18 0910 10/09/18 0444  NA 135 137 138  K 4.3 4.7 4.2  CL 103 106 106  CO2 23 21* 23  GLUCOSE 124* 92 92  BUN 39* 38* 31*  CREATININE 1.39* 1.30* 1.21  CALCIUM 8.9 8.7* 8.5*   GFR: Estimated Creatinine Clearance: 53.8 mL/min (by C-G formula based on SCr of 1.21 mg/dL). Liver Function Tests: Recent Labs  Lab 10/07/18 1224 10/08/18 0910  AST 47* 79*  ALT 28 26  ALKPHOS 37* 37*  BILITOT 1.0 1.1  PROT 6.2* 5.5*  ALBUMIN 3.3* 2.9*   No results for input(s): LIPASE, AMYLASE in the last 168 hours. No results for input(s): AMMONIA in the last 168 hours. Coagulation Profile: No results for input(s): INR, PROTIME in the last 168 hours. Cardiac Enzymes: No results for input(s): CKTOTAL, CKMB, CKMBINDEX, TROPONINI in the last  168 hours. BNP (last 3 results) No results for input(s): PROBNP in the last 8760 hours. HbA1C: No results for input(s): HGBA1C in the last 72 hours. CBG: No results for input(s): GLUCAP in the last 168 hours. Lipid Profile: No results for input(s): CHOL, HDL, LDLCALC, TRIG, CHOLHDL, LDLDIRECT in the last 72 hours. Thyroid Function Tests: No results for input(s): TSH, T4TOTAL, FREET4, T3FREE, THYROIDAB in the last 72 hours. Anemia Panel: Recent  Labs    10/07/18 1954  VITAMINB12 1,340*   Urine analysis:    Component Value Date/Time   COLORURINE YELLOW 10/07/2018 1700   APPEARANCEUR CLEAR 10/07/2018 1700   LABSPEC 1.025 10/07/2018 1700   PHURINE 5.0 10/07/2018 1700   GLUCOSEU NEGATIVE 10/07/2018 1700   HGBUR NEGATIVE 10/07/2018 1700   BILIRUBINUR NEGATIVE 10/07/2018 1700   BILIRUBINUR n 08/10/2012 1612   KETONESUR NEGATIVE 10/07/2018 1700   PROTEINUR NEGATIVE 10/07/2018 1700   UROBILINOGEN 0.2 08/10/2012 1612   UROBILINOGEN 0.2 02/24/2012 0133   NITRITE NEGATIVE 10/07/2018 1700   LEUKOCYTESUR NEGATIVE 10/07/2018 1700   Sepsis Labs: @LABRCNTIP (procalcitonin:4,lacticidven:4)  ) Recent Results (from the past 240 hour(s))  Blood Culture (routine x 2)     Status: None (Preliminary result)   Collection Time: 10/07/18  1:41 PM  Result Value Ref Range Status   Specimen Description BLOOD SITE NOT SPECIFIED  Final   Special Requests   Final    BOTTLES DRAWN AEROBIC AND ANAEROBIC Blood Culture results may not be optimal due to an excessive volume of blood received in culture bottles   Culture   Final    NO GROWTH 2 DAYS Performed at [facility], [place] [address] [place], [place] [address]    Report Status PENDING  Incomplete  Blood Culture (routine x 2)     Status: None (Preliminary result)   Collection Time: 10/07/18  1:46 PM  Result Value Ref Range Status   Specimen Description BLOOD RIGHT ANTECUBITAL  Final   Special Requests   Final    BOTTLES DRAWN AEROBIC AND ANAEROBIC Blood Culture adequate volume   Culture   Final    NO GROWTH 2 DAYS Performed at [facility], [place] [address], [place], [place] [address]    Report Status PENDING  Incomplete         Radiology Studies: Ct Head Wo Contrast  Result Date: 10/07/2018 CLINICAL DATA:  Fall yesterday. EXAM: CT HEAD WITHOUT CONTRAST CT CERVICAL SPINE WITHOUT CONTRAST TECHNIQUE: Multidetector CT imaging of the head and cervical spine was performed  following the standard protocol without intravenous contrast. Multiplanar CT image reconstructions of the cervical spine were also generated. COMPARISON:  None. FINDINGS: CT HEAD FINDINGS Brain: No evidence of acute infarction, hemorrhage, hydrocephalus, extra-axial collection or mass lesion/mass effect. Mild generalized cerebral atrophy. Scattered mild periventricular and subcortical white matter hypodensities are nonspecific, but favored to reflect chronic microvascular ischemic changes. Vascular: Calcified atherosclerosis at the skullbase. No hyperdense vessel. Skull: Normal. Negative for fracture or focal lesion. Sinuses/Orbits: No acute finding. Other: None. CT CERVICAL SPINE FINDINGS Alignment: No traumatic malalignment. Facet mediated 4 mm anterolisthesis at C5-C6 and 3 mm anterolisthesis at C7-T1. Skull base and vertebrae: No acute fracture. No primary bone lesion or focal pathologic process. Soft tissues and spinal canal: No prevertebral fluid or swelling. No visible canal hematoma. Disc levels:  Diffuse moderate multilevel cervical spondylosis. Upper chest: Negative. Other: None. IMPRESSION: 1. No acute intracranial abnormality. Mild atrophy and chronic microvascular ischemic changes. 2. No acute cervical spine fracture.  Moderate cervical spondylosis. Electronically Signed   By: Titus Dubin M.D.   On: 10/07/2018 14:36   Ct Cervical Spine Wo Contrast  Result Date: 10/07/2018 CLINICAL DATA:  Fall yesterday. EXAM: CT HEAD WITHOUT CONTRAST CT CERVICAL SPINE WITHOUT CONTRAST TECHNIQUE: Multidetector CT imaging of the head and cervical spine was performed following the standard protocol without intravenous contrast. Multiplanar CT image reconstructions of the cervical spine were also generated. COMPARISON:  None. FINDINGS: CT HEAD FINDINGS Brain: No evidence of acute infarction, hemorrhage, hydrocephalus, extra-axial collection or mass lesion/mass effect. Mild generalized cerebral atrophy. Scattered mild  periventricular and subcortical white matter hypodensities are nonspecific, but favored to reflect chronic microvascular ischemic changes. Vascular: Calcified atherosclerosis at the skullbase. No hyperdense vessel. Skull: Normal. Negative for fracture or focal lesion. Sinuses/Orbits: No acute finding. Other: None. CT CERVICAL SPINE FINDINGS Alignment: No traumatic malalignment. Facet mediated 4 mm anterolisthesis at C5-C6 and 3 mm anterolisthesis at C7-T1. Skull base and vertebrae: No acute fracture. No primary bone lesion or focal pathologic process. Soft tissues and spinal canal: No prevertebral fluid or swelling. No visible canal hematoma. Disc levels:  Diffuse moderate multilevel cervical spondylosis. Upper chest: Negative. Other: None. IMPRESSION: 1. No acute intracranial abnormality. Mild atrophy and chronic microvascular ischemic changes. 2. No acute cervical spine fracture. Moderate cervical spondylosis. Electronically Signed   By: Titus Dubin M.D.   On: 10/07/2018 14:36   Dg Chest Port 1 View  Result Date: 10/07/2018 CLINICAL DATA:  Generalized weakness.  Hypertension. EXAM: PORTABLE CHEST 1 VIEW COMPARISON:  Oct 29, 2009 FINDINGS: A portion of the right apex is not appreciated due to overlying of patient mandible. There is scarring in the right base with chronic blunting of the right costophrenic angle. There is no evident edema or consolidation. The heart size and pulmonary vascularity are normal. No adenopathy. Patient is status post coronary artery bypass grafting. There is aortic atherosclerosis. No bone lesions. IMPRESSION: Chronic scarring right base. No edema or consolidation. Note that right apex is partially obscured by the patient's mandible. Heart size normal. Status post coronary artery bypass grafting. Aortic Atherosclerosis (ICD10-I70.0). Electronically Signed   By: Lowella Grip III M.D.   On: 10/07/2018 14:15        Scheduled Meds: . aspirin EC  81 mg Oral Daily  .  carvedilol  3.125 mg Oral BID WC  . enoxaparin (LOVENOX) injection  40 mg Subcutaneous Q24H  . famotidine  20 mg Oral QHS  . levothyroxine  50 mcg Oral Q0600  . multivitamin with minerals  1 tablet Oral Daily  . simvastatin  20 mg Oral QHS  . tamsulosin  0.4 mg Oral QHS   Continuous Infusions: . sodium chloride    . cefTRIAXone (ROCEPHIN)  IV Stopped (10/08/18 1916)  . vancomycin Stopped (10/08/18 2130)     LOS: 2 days    Time spent: 55min    Domenic Polite, MD [facility]   10/09/2018, 11:19 AM

## 2018-10-09 NOTE — TOC Initial Note (Signed)
Transition of Care [facility]) - Initial/Assessment Note    Patient Details  Name: Zachary Decker MRN: [id number] Date of Birth: 1933-06-21  Transition of Care [facility]) CM/SW Contact:    Gelene Mink, [place] Phone Number: 10/09/2018, 1:03 PM  Clinical Narrative:                  CSW went and spoke with the patient at bedside. The patient was very tired. CSW asked if it was okay if she spoke with the patient. He stated that it was. CSW introduced herself and explained her role. He is agreeable to SNF. The patient deferred making the decision to his daughter. He is wanting to stay close to [facility]. CSW called and spoke with the patient's daughter. Her name is [employer]. She stated that she wants her father to have the best care possible. She is currently concerned about COVID-19 and her dad going to a facility. CSW stated that she understood. Patient's daughter understands that her dad. She asked if she could bring him home with home health, CSW went over the home health options. She asked if that didn't work then could they decide to go to SNF, CSW explained that if that occurred then the family would be paying for SNF. CSW explained that insurance will not cover if the patient goes home from the hospital then want to go to SNF.   CSW went over the facility list with the patients daughter. She reiterated that she wanted top rated facilities. She stated that her daughter works at [employer] and has a Marine scientist friend at [employer]. Top preferences are (these are not in order): [facility]), [facility], and [facility]. [facility] would be last choice. CSW also faxed the patient out to [place] facilities based upon the patient's wishes. CSW completed preadmission checklist with the patient. CSW provided the CMS list to the patients daughter via email. CSW placed a copy in the patients chart.   Expected Discharge Plan: Skilled Nursing Facility Barriers to Discharge: Continued Medical Work  up   Patient Goals and CMS Choice Patient states their goals for this hospitalization and ongoing recovery are:: Pt daughter wants her dad to get rehab.  CMS Medicare.gov Compare Post Acute Care list provided to:: Patient Represenative (must comment)(Pt daughter, pt deferred decision to his daughter. ) Choice offered to / list presented to : Adult Children  Expected Discharge Plan and Services Expected Discharge Plan: [place] In-house Referral: NA Discharge Planning Services: NA Post Acute Care Choice: [place] Living arrangements for the past 2 months: Single Family Home                 DME Arranged: N/A DME Agency: NA HH Arranged: NA HH Agency: NA  Prior Living Arrangements/Services Living arrangements for the past 2 months: Single Family Home Lives with:: Spouse Patient language and need for interpreter reviewed:: No Do you feel safe going back to the place where you live?: Yes      Need for Family Participation in Patient Care: No (Comment) Care giver support system in place?: Yes (comment) Current home services: DME(walker) Criminal Activity/Legal Involvement Pertinent to Current Situation/Hospitalization: No - Comment as needed  Activities of Daily Living      Permission Sought/Granted Permission sought to share information with : Case Manager Permission granted to share information with : Yes, Verbal Permission Granted  Share Information with NAME: Olivia Mackie  Permission granted to share info w AGENCY: All SNF  Permission granted to share info  w Relationship: daughter   Permission granted to share info w Contact Information: [phone]  Emotional Assessment Appearance:: Appears stated age Attitude/Demeanor/Rapport: Other (comment)(Pt was tired) Affect (typically observed): Calm Orientation: : Oriented to Self, Oriented to  Time, Oriented to Place, Oriented to Situation Alcohol / Substance Use: Not Applicable Psych Involvement: No  (comment)  Admission diagnosis:  Cellulitis of left foot [L03.116] Cellulitis of left leg [L03.116] Generalized weakness [R53.1] Leukocytosis, unspecified type [D72.829] Patient Active Problem List   Diagnosis Date Noted  . Cellulitis, leg 10/07/2018  . Balance disorder 04/13/2018  . Senile purpura ([place]) 04/13/2018  . Insomnia 04/13/2018  . Low back pain 04/19/2015  . CKD (chronic kidney disease), stage III ([place]) 04/20/2014  . Anxiety state 04/17/2014  . CAD w/ hx MI s/p CABG 09/07/2012  . ANEMIA, VITAMIN B12 DEFICIENCY 04/22/2010  . Cardiomyopathy, ischemic 03/22/2010  . Actinic keratosis 06/25/2009  . BASAL CELL CARCINOMA, NOSE 02/05/2009  . Localized osteoarthrosis, lower leg 12/06/2008  . HYPERCHOLESTEROLEMIA 10/31/2008  . BPH (benign prostatic hyperplasia) 09/28/2007  . Hypothyroidism 03/26/2007  . Essential hypertension 03/22/2007  . GERD 03/22/2007   PCP:  [name], MD Pharmacy:   [id number] [place], [place] [address] Phone: [phone] Fax: [phone]     Social Determinants of Health (SDOH) Interventions    Readmission Risk Interventions Readmission Risk Prevention Plan 10/09/2018  Transportation Screening Complete  PCP or Specialist Appt within 5-7 Days Complete  Home Care Screening Complete  Medication Review (RN CM) Complete  Some recent data might be hidden

## 2018-10-10 LAB — BASIC METABOLIC PANEL
Anion gap: 9 (ref 5–15)
BUN: 26 mg/dL — ABNORMAL HIGH (ref 8–23)
CO2: 21 mmol/L — ABNORMAL LOW (ref 22–32)
Calcium: 8.3 mg/dL — ABNORMAL LOW (ref 8.9–10.3)
Chloride: 108 mmol/L (ref 98–111)
Creatinine, Ser: 1.06 mg/dL (ref 0.61–1.24)
GFR calc Af Amer: >60 mL/min (ref >60)
GFR calc non Af Amer: >60 mL/min (ref >60)
Glucose, Bld: 94 mg/dL (ref 70–99)
Potassium: 4.1 mmol/L (ref 3.5–5.1)
Sodium: 138 mmol/L (ref 135–145)

## 2018-10-10 LAB — CBC
HCT: 31.2 % — ABNORMAL LOW (ref 39.0–52.0)
Hemoglobin: 10.7 g/dL — ABNORMAL LOW (ref 13.0–17.0)
MCH: 34.7 pg — ABNORMAL HIGH (ref 26.0–34.0)
MCHC: 34.3 g/dL (ref 30.0–36.0)
MCV: 101.3 fL — ABNORMAL HIGH (ref 80.0–100.0)
Platelets: 194 10*3/uL (ref 150–400)
RBC: 3.08 MIL/uL — ABNORMAL LOW (ref 4.22–5.81)
RDW: 12.7 % (ref 11.5–15.5)
WBC: 9.7 10*3/uL (ref 4.0–10.5)
nRBC: 0 % (ref 0.0–0.2)

## 2018-10-10 LAB — GLUCOSE, CAPILLARY
Glucose-Capillary: 85 mg/dL (ref 70–99)
Glucose-Capillary: 99 mg/dL (ref 70–99)
Glucose-Capillary: 99 mg/dL (ref 70–99)
Glucose-Capillary: 106 mg/dL — ABNORMAL HIGH (ref 70–99)

## 2018-10-10 MED ORDER — CEPHALEXIN 250 MG PO CAPS
250.0000 mg | ORAL_CAPSULE | Freq: Three times a day (TID) | ORAL | Status: DC
  Administered 2018-10-10 – 2018-10-11 (×4): 250 mg via ORAL
  Filled 2018-10-10 (×4): qty 1

## 2018-10-10 NOTE — Progress Notes (Signed)
PROGRESS NOTE    Zachary Decker  [id number]:[id number] DOB: 1933/04/23 DOA: 10/07/2018 PCP: [name], MD  Brief Narrative 85 year old Caucasian male with past medical history significant for coronary artery disease/MI, hypertension, hyperlipidemia, BPH , PVD and respiratory failure.  Patient also has history of thrombophlebitis.  Patient is a poor historian.  Patient presented to the hospital feeling weak.  Apparently, patient was also said to have fallen at home.  Imaging studies following the fall was nonrevealing.  However, WBC was noted to be 30,000.  Left leg cellulitis/redness was noted. -Improving with fluids, antibiotics   Assessment & Plan:   Sepsis/cellulitis of LLE -Admitted with generalized weakness, cellulitis of left lower extremity, leukocytosis of 30,000 with hypotension- BP of 21mmHg -Continues to have slow clinical improvement -Blood pressure more stable, leukocytosis significantly better -Blood cultures negative -Discontinue IV fluids, transition from IV vancomycin/ceftriaxone to oral Keflex today -Social work consulted for [employer] planning  Macrocytic anemia -monitor Hb -B12 WNL -no plan for further workup inpatient  History of CAD/CABG -continue ASA/Coreg/statin  History of BPH -Continue Flomax  Hypothyroidism -Continue Synthroid  DVT prophylaxis: Lovenox Code Status: Full code Family Communication: No family at bedside, called and updated daughter Disposition Plan: SNF tomorrow if stable  Consultants:      Procedures:   Antimicrobials:    Subjective: -Improving, still feels a little weak, breathing better,  Objective: Vitals:   10/09/18 2355 10/10/18 0422 10/10/18 0819 10/10/18 1246  BP: 124/72 135/69 122/80 132/77  Pulse: (!) 59 60 66 72  Resp: 16 18 17 18   Temp: 98.4 F (36.9 C) 97.8 F (36.6 C) 98.4 F (36.9 C) 98 F (36.7 C)  TempSrc: Oral Oral Oral Oral  SpO2: 99% 96% 97% 98%  Weight:      Height:        Intake/Output  Summary (Last 24 hours) at 10/10/2018 1328 Last data filed at 10/10/2018 0511 Gross per 24 hour  Intake 590.09 ml  Output 1100 ml  Net -509.91 ml   Filed Weights   10/07/18 1208  Weight: 83.9 kg    Examination:  Gen: Awake, Alert, Oriented X 2, frail, chronically ill-appearing male sitting up in bed, no distress HEENT: PERRLA, Neck supple, no JVD Lungs: Creased breath sounds at both bases, rest clear CVS: RRR,No Gallops,Rubs or new Murmurs Abd: soft, Non tender, non distended, BS present Extremities: Left lower extremity with redness swelling warmth and tenderness, improving Skin: As above Psychiatry: Flat affect    Data Reviewed:   CBC: Recent Labs  Lab 10/07/18 1224 10/08/18 0910 10/09/18 0444 10/10/18 0452  WBC 30.4* 19.9* 12.3* 9.7  HGB 11.4* 11.1* 10.5* 10.7*  HCT 33.0* 32.9* 32.2* 31.2*  MCV 100.6* 101.5* 101.9* 101.3*  PLT 179 164 176 174   Basic Metabolic Panel: Recent Labs  Lab 10/07/18 1224 10/08/18 0910 10/09/18 0444 10/10/18 0452  NA 135 137 138 138  K 4.3 4.7 4.2 4.1  CL 103 106 106 108  CO2 23 21* 23 21*  GLUCOSE 124* 92 92 94  BUN 39* 38* 31* 26*  CREATININE 1.39* 1.30* 1.21 1.06  CALCIUM 8.9 8.7* 8.5* 8.3*   GFR: Estimated Creatinine Clearance: 61.4 mL/min (by C-G formula based on SCr of 1.06 mg/dL). Liver Function Tests: Recent Labs  Lab 10/07/18 1224 10/08/18 0910  AST 47* 79*  ALT 28 26  ALKPHOS 37* 37*  BILITOT 1.0 1.1  PROT 6.2* 5.5*  ALBUMIN 3.3* 2.9*   No results for input(s): LIPASE, AMYLASE in the  last 168 hours. No results for input(s): AMMONIA in the last 168 hours. Coagulation Profile: No results for input(s): INR, PROTIME in the last 168 hours. Cardiac Enzymes: No results for input(s): CKTOTAL, CKMB, CKMBINDEX, TROPONINI in the last 168 hours. BNP (last 3 results) No results for input(s): PROBNP in the last 8760 hours. HbA1C: No results for input(s): HGBA1C in the last 72 hours. CBG: Recent Labs  Lab 10/09/18  1243 10/09/18 1625 10/09/18 2130 10/10/18 0614 10/10/18 1153  GLUCAP 76 96 124* 85 99   Lipid Profile: No results for input(s): CHOL, HDL, LDLCALC, TRIG, CHOLHDL, LDLDIRECT in the last 72 hours. Thyroid Function Tests: No results for input(s): TSH, T4TOTAL, FREET4, T3FREE, THYROIDAB in the last 72 hours. Anemia Panel: Recent Labs    10/07/18 1954  VITAMINB12 1,340*   Urine analysis:    Component Value Date/Time   COLORURINE YELLOW 10/07/2018 1700   APPEARANCEUR CLEAR 10/07/2018 1700   LABSPEC 1.025 10/07/2018 1700   PHURINE 5.0 10/07/2018 1700   GLUCOSEU NEGATIVE 10/07/2018 1700   HGBUR NEGATIVE 10/07/2018 1700   BILIRUBINUR NEGATIVE 10/07/2018 1700   BILIRUBINUR n 08/10/2012 1612   KETONESUR NEGATIVE 10/07/2018 1700   PROTEINUR NEGATIVE 10/07/2018 1700   UROBILINOGEN 0.2 08/10/2012 1612   UROBILINOGEN 0.2 02/24/2012 0133   NITRITE NEGATIVE 10/07/2018 1700   LEUKOCYTESUR NEGATIVE 10/07/2018 1700   Sepsis Labs: @LABRCNTIP (procalcitonin:4,lacticidven:4)  ) Recent Results (from the past 240 hour(s))  Blood Culture (routine x 2)     Status: None (Preliminary result)   Collection Time: 10/07/18  1:41 PM  Result Value Ref Range Status   Specimen Description BLOOD SITE NOT SPECIFIED  Final   Special Requests   Final    BOTTLES DRAWN AEROBIC AND ANAEROBIC Blood Culture results may not be optimal due to an excessive volume of blood received in culture bottles   Culture   Final    NO GROWTH 3 DAYS Performed at [facility], [place] [address], [place], [place] [address]    Report Status PENDING  Incomplete  Blood Culture (routine x 2)     Status: None (Preliminary result)   Collection Time: 10/07/18  1:46 PM  Result Value Ref Range Status   Specimen Description BLOOD RIGHT ANTECUBITAL  Final   Special Requests   Final    BOTTLES DRAWN AEROBIC AND ANAEROBIC Blood Culture adequate volume   Culture   Final    NO GROWTH 3 DAYS Performed at [facility], [place] [address] [place], [place] [address]    Report Status PENDING  Incomplete         Radiology Studies: No results found.      Scheduled Meds: . aspirin EC  81 mg Oral Daily  . carvedilol  3.125 mg Oral BID WC  . cephALEXin  250 mg Oral Q8H  . enoxaparin (LOVENOX) injection  40 mg Subcutaneous Q24H  . famotidine  20 mg Oral QHS  . levothyroxine  50 mcg Oral Q0600  . multivitamin with minerals  1 tablet Oral Daily  . simvastatin  20 mg Oral QHS  . tamsulosin  0.4 mg Oral QHS   Continuous Infusions:    LOS: 3 days    Time spent: 33min    Domenic Polite, MD [facility]   10/10/2018, 1:28 PM

## 2018-10-10 NOTE — Progress Notes (Signed)
Pharmacy Antibiotic Note  Zachary Decker is a 85 y.o. male admitted on 10/07/2018 with cellulitis.    Plan: Continue Vancomycin  1250 q24h Continue ceftriaxone 1gm IV q24h per MD Monitor renal fx cx vanc  lvls after dose this PM  Height: 6\' 2"  (188 cm) Weight: 185 lb (83.9 kg) IBW/kg (Calculated) : 82.2  Temp (24hrs), Avg:98.6 F (37 C), Min:97.8 F (36.6 C), Max:99.3 F (37.4 C)  Recent Labs  Lab 10/07/18 1224 10/07/18 1354 10/08/18 0910 10/09/18 0444 10/10/18 0452  WBC 30.4*  --  19.9* 12.3* 9.7  CREATININE 1.39*  --  1.30* 1.21 1.06  LATICACIDVEN  --  1.1  --   --   --     Estimated Creatinine Clearance: 61.4 mL/min (by C-G formula based on SCr of 1.06 mg/dL).    Allergies  Allergen Reactions  . Losartan Potassium Other (See Comments)    Not known  . Naproxen Nausea And Vomiting  . Penicillins Rash    Did it involve swelling of the face/tongue/throat, SOB, or low BP? No Did it involve sudden or severe rash/hives, skin peeling, or any reaction on the inside of your mouth or nose? Yes Did you need to seek medical attention at a hospital or doctor's office? Unknown When did it last happen? If all above answers are "NO", may proceed with cephalosporin use.  TOLERATED CEFTRIAXONE 10/07/2018   Vancomycin 1250 mg IV Q 24 hrs. Goal AUC 400-550. Expected AUC: 477 SCr used: 1.39  Braxtyn Dorff A. Levada Dy, PharmD, [place] Please utilize Amion for appropriate phone number to reach the unit pharmacist ([place])    10/10/2018 12:22 PM

## 2018-10-11 DIAGNOSIS — L03116 Cellulitis of left lower limb: Secondary | ICD-10-CM

## 2018-10-11 LAB — CBC
HCT: 30.4 % — ABNORMAL LOW (ref 39.0–52.0)
Hemoglobin: 10.5 g/dL — ABNORMAL LOW (ref 13.0–17.0)
MCH: 34.7 pg — ABNORMAL HIGH (ref 26.0–34.0)
MCHC: 34.5 g/dL (ref 30.0–36.0)
MCV: 100.3 fL — ABNORMAL HIGH (ref 80.0–100.0)
Platelets: 197 10*3/uL (ref 150–400)
RBC: 3.03 MIL/uL — ABNORMAL LOW (ref 4.22–5.81)
RDW: 12.6 % (ref 11.5–15.5)
WBC: 8 10*3/uL (ref 4.0–10.5)
nRBC: 0 % (ref 0.0–0.2)

## 2018-10-11 LAB — GLUCOSE, CAPILLARY: Glucose-Capillary: 88 mg/dL (ref 70–99)

## 2018-10-11 LAB — FOLATE RBC
Folate, Hemolysate: >620 ng/mL
Folate, RBC: >1908 ng/mL (ref >498)
Hematocrit: 32.5 % — ABNORMAL LOW (ref 37.5–51.0)

## 2018-10-11 LAB — BASIC METABOLIC PANEL
Anion gap: 8 (ref 5–15)
BUN: 21 mg/dL (ref 8–23)
CO2: 24 mmol/L (ref 22–32)
Calcium: 8.4 mg/dL — ABNORMAL LOW (ref 8.9–10.3)
Chloride: 106 mmol/L (ref 98–111)
Creatinine, Ser: 1.21 mg/dL (ref 0.61–1.24)
GFR calc Af Amer: >60 mL/min (ref >60)
GFR calc non Af Amer: 55 mL/min — ABNORMAL LOW (ref >60)
Glucose, Bld: 86 mg/dL (ref 70–99)
Potassium: 4.1 mmol/L (ref 3.5–5.1)
Sodium: 138 mmol/L (ref 135–145)

## 2018-10-11 MED ORDER — CEPHALEXIN 250 MG PO CAPS: 250.0000 mg | ORAL_CAPSULE | Freq: Three times a day (TID) | ORAL | 0 refills | Status: DC

## 2018-10-11 NOTE — TOC Transition Note (Signed)
Transition of Care [facility]) - CM/SW Discharge Note   Patient Details  Name: ROWDY GUERRINI MRN: [id number] Date of Birth: 1932-10-30  Transition of Care [facility]) CM/SW Contact:  Pollie Friar, RN Phone Number: 10/11/2018, 4:16 PM   Clinical Narrative:    Pts family prefer he goes home instead of SNF. CM spoke to daughter: Olivia Mackie and offered choice. She selected [facility].  DME orders called into [facility] with [employer].  Daughter wants pt transported home via [facility]. They do not want to wait on the delivery of the DME to the home. They state he can sit in his recliner until DME is delivered. PTAR arranged and bedside RN updated.  Daughter made aware of PTAR estimated time. PTAR packet at the desk.   Final next level of care: Home w Home Health Services Barriers to Discharge: No Barriers Identified   Patient Goals and CMS Choice Patient states their goals for this hospitalization and ongoing recovery are:: Pt daughter wants her dad to get rehab.  CMS Medicare.gov Compare Post Acute Care list provided to:: Patient Represenative (must comment) Choice offered to / list presented to : Adult Children(daughter)  Discharge Placement                       Discharge Plan and Services In-house Referral: NA Discharge Planning Services: NA Post Acute Care Choice: Durable Medical Equipment, Home Health          DME Arranged: 3-N-1, Wheelchair manual, Hospital bed(hoyer lift) DME Agency: [facility] HH Arranged: PT, OT, Nurse's Aide, Social Work [employer] Agency: [facility]   Social Determinants of Health (SDOH) Interventions     Readmission Risk Interventions Readmission Risk Prevention Plan 10/09/2018  Transportation Screening Complete  PCP or Specialist Appt within 5-7 Days Complete  Home Care Screening Complete  Medication Review (RN CM) Complete  Some recent data might be hidden

## 2018-10-11 NOTE — Progress Notes (Signed)
Patient discharged home via EMS transport. Patient alert oriented transferred from recliner to stretcher with two person assist max assist. PIV removed prior to discharge paperwork sent via transporter.

## 2018-10-11 NOTE — Discharge Summary (Addendum)
Physician Discharge Summary  Zachary Decker:[id number] DOB: 1932/11/07 DOA: 10/07/2018  PCP: Zachary Olp, MD  Admit date: 10/07/2018 Discharge date: 10/11/2018  Time spent: 35 minutes  Recommendations for Outpatient Follow-up:  1. PCP in 1 week   Discharge Diagnoses:   Sepsis Left lower extremity cellulitis Metabolic encephalopathy Macrocytic anemia CAD/CABG History of BPH Peripheral vascular disease   Discharge Condition: stable  Diet recommendation: heart Healthy diet  Filed Weights   10/07/18 1208  Weight: 83.9 kg    History of present illness:  85 year old Caucasian male with past medical history significant for coronary artery disease/MI, hypertension, hyperlipidemia, BPH , PVD and respiratory failure. Patient also has history of thrombophlebitis. Patient is a poor historian. Patient presented to the hospital feeling weak. Apparently, patient was also said to have fallen at home. Imaging studies following the fall was nonrevealing. However, WBC was noted to be 30,000. Left leg cellulitis/redness was noted.  Hospital Course:   Sepsis/cellulitis of LLE -Admitted with generalized weakness, cellulitis of left lower extremity, leukocytosis of 30,000 with hypotension- BP of 21mmHg -Clinically improved with IV fluids and antibiotics -Blood cultures were negative, IV vancomycin was transitioned to oral Keflex yesterday -PT evaluation completed, SNF was recommended -Patient will be discharged to SNF for rehabilitation -Patient has mild chronic edema in his left leg lower leg likely due to vein graft excision for prior CABG  Metabolic encephalopathy -Due to sepsis -Improved, exam is nonfocal, CT head on admission was unremarkable  Macrocytic anemia -Patient was noted to have macrocytic anemia this admission, B12 /folate levels were within normal limits, patient denies history of alcoholism, due to advanced age and numerous comorbidities additional work-up  was not pursued inpatient -This can be considered at follow-up on a non urgent basis  History of CAD/CABG -continue ASA/Coreg/statin  History of BPH -Continue Flomax  Hypothyroidism -Continue Synthroid  Discharge Exam: Vitals:   10/11/18 0427 10/11/18 0807  BP: 134/76 (!) 141/64  Pulse: 61 (!) 56  Resp: 16 16  Temp: 98.1 F (36.7 C) 98 F (36.7 C)  SpO2: 95% 96%    General: AAOx3 Cardiovascular: S1S2/RRR Respiratory: CTAB  Discharge Instructions   Discharge Instructions    Diet - low sodium heart healthy   Complete by:  As directed    Increase activity slowly   Complete by:  As directed      Allergies as of 10/11/2018      Reactions   Losartan Potassium Other (See Comments)   Not known   Naproxen Nausea And Vomiting   Penicillins Rash   Did it involve swelling of the face/tongue/throat, SOB, or low BP? No Did it involve sudden or severe rash/hives, skin peeling, or any reaction on the inside of your mouth or nose? Yes Did you need to seek medical attention at a hospital or doctor's office? Unknown When did it last happen? If all above answers are "NO", may proceed with cephalosporin use. TOLERATED CEFTRIAXONE 10/07/2018      Medication List    TAKE these medications   aspirin 81 MG tablet Take 81 mg by mouth daily.   carvedilol 3.125 MG tablet Commonly known as:  COREG TAKE 1 TABLET BY MOUTH 2 TIMES A DAY WITH MEAL What changed:    how much to take  how to take this  when to take this  additional instructions   Centrum Silver 50+Men Tabs Take 1 tablet by mouth daily.   cephALEXin 250 MG capsule Commonly known as:  KEFLEX Take 1  capsule (250 mg total) by mouth every 8 (eight) hours for 3 days.   [employer] Melatonin 5 MG Tabs Generic drug:  Melatonin Take 2.5 mg by mouth at bedtime.   diclofenac sodium 1 % Gel Commonly known as:  VOLTAREN Apply 4 g topically at bedtime as needed (neck pain).   Folbee Plus Tabs TAKE 1 TABLET BY  MOUTH EVERY DAY   levothyroxine 50 MCG tablet Commonly known as:  SYNTHROID TAKE 1 TABLET (50 MCG TOTAL) BY MOUTH DAILY. What changed:  See the new instructions.   lisinopril 2.5 MG tablet Commonly known as:  ZESTRIL Take 1 tablet (2.5 mg total) by mouth daily.   nitroGLYCERIN 0.4 MG SL tablet Commonly known as:  NITROSTAT Place 1 tablet (0.4 mg total) under the tongue every 5 (five) minutes as needed for chest pain.   OVER THE COUNTER MEDICATION Take 1 capsule by mouth daily. Probiotic   ranitidine 300 MG tablet Commonly known as:  ZANTAC TAKE 1 TABLET BY MOUTH EVERYDAY AT BEDTIME What changed:  See the new instructions.   simvastatin 20 MG tablet Commonly known as:  ZOCOR Take 1 tablet (20 mg total) by mouth at bedtime.   tamsulosin 0.4 MG Caps capsule Commonly known as:  FLOMAX TAKE 1 CAPSULE (0.4 MG TOTAL) BY MOUTH DAILY. What changed:    how much to take  how to take this  when to take this  additional instructions   TYLENOL EXTRA STRENGTH PO Take 1-2 tablets by mouth daily as needed (for back pain).   vitamin B-12 500 MCG tablet Commonly known as:  CYANOCOBALAMIN Take 500 mcg by mouth daily.      Allergies  Allergen Reactions  . Losartan Potassium Other (See Comments)    Not known  . Naproxen Nausea And Vomiting  . Penicillins Rash    Did it involve swelling of the face/tongue/throat, SOB, or low BP? No Did it involve sudden or severe rash/hives, skin peeling, or any reaction on the inside of your mouth or nose? Yes Did you need to seek medical attention at a hospital or doctor's office? Unknown When did it last happen? If all above answers are "NO", may proceed with cephalosporin use.  TOLERATED CEFTRIAXONE 10/07/2018   Follow-up Information    Zachary Olp, MD. Schedule an appointment as soon as possible for a visit in 1 week(s).   Specialty:  Family Medicine Contact information: [address] [place]  [address] [phone]            The results of significant diagnostics from this hospitalization (including imaging, microbiology, ancillary and laboratory) are listed below for reference.    Significant Diagnostic Studies: Ct Head Wo Contrast  Result Date: 10/07/2018 CLINICAL DATA:  Fall yesterday. EXAM: CT HEAD WITHOUT CONTRAST CT CERVICAL SPINE WITHOUT CONTRAST TECHNIQUE: Multidetector CT imaging of the head and cervical spine was performed following the standard protocol without intravenous contrast. Multiplanar CT image reconstructions of the cervical spine were also generated. COMPARISON:  None. FINDINGS: CT HEAD FINDINGS Brain: No evidence of acute infarction, hemorrhage, hydrocephalus, extra-axial collection or mass lesion/mass effect. Mild generalized cerebral atrophy. Scattered mild periventricular and subcortical white matter hypodensities are nonspecific, but favored to reflect chronic microvascular ischemic changes. Vascular: Calcified atherosclerosis at the skullbase. No hyperdense vessel. Skull: Normal. Negative for fracture or focal lesion. Sinuses/Orbits: No acute finding. Other: None. CT CERVICAL SPINE FINDINGS Alignment: No traumatic malalignment. Facet mediated 4 mm anterolisthesis at C5-C6 and 3 mm anterolisthesis at C7-T1. Skull  base and vertebrae: No acute fracture. No primary bone lesion or focal pathologic process. Soft tissues and spinal canal: No prevertebral fluid or swelling. No visible canal hematoma. Disc levels:  Diffuse moderate multilevel cervical spondylosis. Upper chest: Negative. Other: None. IMPRESSION: 1. No acute intracranial abnormality. Mild atrophy and chronic microvascular ischemic changes. 2. No acute cervical spine fracture. Moderate cervical spondylosis. Electronically Signed   By: Titus Dubin M.D.   On: 10/07/2018 14:36   Ct Cervical Spine Wo Contrast  Result Date: 10/07/2018 CLINICAL DATA:  Fall yesterday. EXAM: CT HEAD WITHOUT CONTRAST CT  CERVICAL SPINE WITHOUT CONTRAST TECHNIQUE: Multidetector CT imaging of the head and cervical spine was performed following the standard protocol without intravenous contrast. Multiplanar CT image reconstructions of the cervical spine were also generated. COMPARISON:  None. FINDINGS: CT HEAD FINDINGS Brain: No evidence of acute infarction, hemorrhage, hydrocephalus, extra-axial collection or mass lesion/mass effect. Mild generalized cerebral atrophy. Scattered mild periventricular and subcortical white matter hypodensities are nonspecific, but favored to reflect chronic microvascular ischemic changes. Vascular: Calcified atherosclerosis at the skullbase. No hyperdense vessel. Skull: Normal. Negative for fracture or focal lesion. Sinuses/Orbits: No acute finding. Other: None. CT CERVICAL SPINE FINDINGS Alignment: No traumatic malalignment. Facet mediated 4 mm anterolisthesis at C5-C6 and 3 mm anterolisthesis at C7-T1. Skull base and vertebrae: No acute fracture. No primary bone lesion or focal pathologic process. Soft tissues and spinal canal: No prevertebral fluid or swelling. No visible canal hematoma. Disc levels:  Diffuse moderate multilevel cervical spondylosis. Upper chest: Negative. Other: None. IMPRESSION: 1. No acute intracranial abnormality. Mild atrophy and chronic microvascular ischemic changes. 2. No acute cervical spine fracture. Moderate cervical spondylosis. Electronically Signed   By: Titus Dubin M.D.   On: 10/07/2018 14:36   Dg Chest Port 1 View  Result Date: 10/07/2018 CLINICAL DATA:  Generalized weakness.  Hypertension. EXAM: PORTABLE CHEST 1 VIEW COMPARISON:  Oct 29, 2009 FINDINGS: A portion of the right apex is not appreciated due to overlying of patient mandible. There is scarring in the right base with chronic blunting of the right costophrenic angle. There is no evident edema or consolidation. The heart size and pulmonary vascularity are normal. No adenopathy. Patient is status post  coronary artery bypass grafting. There is aortic atherosclerosis. No bone lesions. IMPRESSION: Chronic scarring right base. No edema or consolidation. Note that right apex is partially obscured by the patient's mandible. Heart size normal. Status post coronary artery bypass grafting. Aortic Atherosclerosis (ICD10-I70.0). Electronically Signed   By: Lowella Grip III M.D.   On: 10/07/2018 14:15    Microbiology: Recent Results (from the past 240 hour(s))  Blood Culture (routine x 2)     Status: None (Preliminary result)   Collection Time: 10/07/18  1:41 PM  Result Value Ref Range Status   Specimen Description BLOOD SITE NOT SPECIFIED  Final   Special Requests   Final    BOTTLES DRAWN AEROBIC AND ANAEROBIC Blood Culture results may not be optimal due to an excessive volume of blood received in culture bottles   Culture   Final    NO GROWTH 3 DAYS Performed at [facility], [place] [address] [place], [place] [address]    Report Status PENDING  Incomplete  Blood Culture (routine x 2)     Status: None (Preliminary result)   Collection Time: 10/07/18  1:46 PM  Result Value Ref Range Status   Specimen Description BLOOD RIGHT ANTECUBITAL  Final   Special Requests   Final  BOTTLES DRAWN AEROBIC AND ANAEROBIC Blood Culture adequate volume   Culture   Final    NO GROWTH 3 DAYS Performed at [facility], [place] [address] [place], [place] [address]    Report Status PENDING  Incomplete     Labs: Basic Metabolic Panel: Recent Labs  Lab 10/07/18 1224 10/08/18 0910 10/09/18 0444 10/10/18 0452 10/11/18 0548  NA 135 137 138 138 138  K 4.3 4.7 4.2 4.1 4.1  CL 103 106 106 108 106  CO2 23 21* 23 21* 24  GLUCOSE 124* 92 92 94 86  BUN 39* 38* 31* 26* 21  CREATININE 1.39* 1.30* 1.21 1.06 1.21  CALCIUM 8.9 8.7* 8.5* 8.3* 8.4*   Liver Function Tests: Recent Labs  Lab 10/07/18 1224 10/08/18 0910  AST 47* 79*  ALT 28 26  ALKPHOS 37* 37*  BILITOT 1.0 1.1  PROT 6.2* 5.5*   ALBUMIN 3.3* 2.9*   No results for input(s): LIPASE, AMYLASE in the last 168 hours. No results for input(s): AMMONIA in the last 168 hours. CBC: Recent Labs  Lab 10/07/18 1224 10/08/18 0910 10/09/18 0444 10/10/18 0452 10/11/18 0548  WBC 30.4* 19.9* 12.3* 9.7 8.0  HGB 11.4* 11.1* 10.5* 10.7* 10.5*  HCT 33.0* 32.9* 32.2* 31.2* 30.4*  MCV 100.6* 101.5* 101.9* 101.3* 100.3*  PLT 179 164 176 194 197   Cardiac Enzymes: No results for input(s): CKTOTAL, CKMB, CKMBINDEX, TROPONINI in the last 168 hours. BNP: BNP (last 3 results) No results for input(s): BNP in the last 8760 hours.  ProBNP (last 3 results) No results for input(s): PROBNP in the last 8760 hours.  CBG: Recent Labs  Lab 10/10/18 0614 10/10/18 1153 10/10/18 1636 10/10/18 2104 10/11/18 0639  GLUCAP 85 99 99 106* 88       Signed:  Domenic Polite MD.  [facility] 10/11/2018, 11:20 AM

## 2018-10-11 NOTE — Progress Notes (Signed)
Physical Therapy Treatment Patient Details Name: Zachary Decker MRN: [id number] DOB: 11-17-1932 Today's Date: 10/11/2018    History of Present Illness Pt is an 85 y/o male admitted secondary to fall, hypotension, and L leg cellulitis. PMH includes CAD, MI, HTN, and PVD.     PT Comments    Pt with slow progression towards goals. Unable to take side steps secondary to heavy posterior lean this session. Required use of stedy to transfer to chair. Required mod-max A to stand this session. Continue to feel pt would benefit from SNF level therapies at d/c, however, pt's family currently refusing. Will need DME below, and max HH services ([facility], [place], [facility], [facility]) at d/c if pt to return home. Will continue to follow acutely to maximize functional mobility independence and safety.     Follow Up Recommendations  SNF;Supervision/Assistance - 24 hour(pt's family currently refusing; will need max HH services)     Equipment Recommendations  3in1 (PT);Wheelchair (measurements PT);Wheelchair cushion (measurements PT);Hospital bed;Other (comment)(hoyer lift; hoyer lift pad)    Recommendations for Other Services       Precautions / Restrictions Precautions Precautions: Fall Restrictions Weight Bearing Restrictions: No    Mobility  Bed Mobility Overal bed mobility: Needs Assistance Bed Mobility: Supine to Sit     Supine to sit: Mod assist     General bed mobility comments: Mod A for assist with scooting hips to EOB and LE assist. Also required assist for trunk elevation.   Transfers Overall transfer level: Needs assistance Equipment used: Rolling walker (2 wheeled) Transfers: Sit to/from Stand Sit to Stand: Max assist;Mod assist;From elevated surface         General transfer comment: Performed standing with RW X2 with mod to max A. Unable to take side steps secondary to posterior lean this session. With stedy, pt requiring mod A for lift assist and pt heavily reliant on UE support.    Ambulation/Gait         Gait velocity: Unable        Stairs             Wheelchair Mobility    Modified Rankin (Stroke Patients Only)       Balance Overall balance assessment: Needs assistance Sitting-balance support: No upper extremity supported;Feet supported Sitting balance-Leahy Scale: Fair   Postural control: Posterior lean Standing balance support: Bilateral upper extremity supported;During functional activity Standing balance-Leahy Scale: Poor Standing balance comment: Posterior lean and heavy reliance on UE support. Also required external support                             Cognition Arousal/Alertness: Awake/alert Behavior During Therapy: WFL for tasks assessed/performed Overall Cognitive Status: No family/caregiver present to determine baseline cognitive functioning                                        Exercises      General Comments        Pertinent Vitals/Pain Pain Assessment: No/denies pain    Home Living                      Prior Function            PT Goals (current goals can now be found in the care plan section) Acute Rehab PT Goals Patient Stated Goal: to be able to walk  PT Goal Formulation: With patient Time For Goal Achievement: 10/22/18 Potential to Achieve Goals: Good Progress towards PT goals: Progressing toward goals    Frequency    Min 3X/week      PT Plan Equipment recommendations need to be updated;Frequency needs to be updated    Co-evaluation              AM-PAC PT "6 Clicks" Mobility   Outcome Measure  Help needed turning from your back to your side while in a flat bed without using bedrails?: A Lot Help needed moving from lying on your back to sitting on the side of a flat bed without using bedrails?: A Lot Help needed moving to and from a bed to a chair (including a wheelchair)?: Total Help needed standing up from a chair using your arms (e.g.,  wheelchair or bedside chair)?: A Lot Help needed to walk in hospital room?: Total Help needed climbing 3-5 steps with a railing? : Total 6 Click Score: 9    End of Session Equipment Utilized During Treatment: Gait belt Activity Tolerance: Patient tolerated treatment well Patient left: in chair;with call bell/phone within reach;with chair alarm set Nurse Communication: Mobility status PT Visit Diagnosis: Difficulty in walking, not elsewhere classified (R26.2);Unsteadiness on feet (R26.81);Muscle weakness (generalized) (M62.81)     Time: 1660-6004 PT Time Calculation (min) (ACUTE ONLY): 23 min  Charges:  $Therapeutic Activity: 23-37 mins                     Leighton Ruff, PT, DPT  Acute Rehabilitation Services  Pager: [phone] Office: [phone]    Rudean Hitt 10/11/2018, 12:46 PM

## 2018-10-11 NOTE — Progress Notes (Signed)
Patient suffers from LLE cellulitis, instability, weakness, inability to ambulate which impairs their ability to perform daily activities like ambulation, ADLs, IADLs in the home.  A walker alone will not resolve the issues with performing activities of daily living. A wheelchair will allow patient to safely perform daily activities.  The patient can self propel in the home or has a caregiver who can provide assistance.     Leighton Ruff, PT, DPT  Acute Rehabilitation Services  Pager: [phone] Office: [phone]

## 2018-10-11 NOTE — Care Management (Signed)
    Durable Medical Equipment  (From admission, onward)         Start     Ordered   10/11/18 1228  For home use only DME Hospital bed  Once    Question Answer Comment  The above medical condition requires: Patient requires the ability to reposition frequently   Head must be elevated greater than: 30 degrees   Bed type Semi-electric      10/11/18 1228   10/11/18 1228  For home use only DME lightweight manual wheelchair with seat cushion  Once    Comments:  Patient suffers from [place] and hospitalization for sepsis which impairs their ability to perform daily activities like walking and ambulating in the home.  A cane will not resolve  issue with performing activities of daily living. A wheelchair will allow patient to safely perform daily activities. Patient is not able to propel themselves in the home using a standard weight wheelchair due to debility and weakness. Patient can self propel in the lightweight wheelchair.  Accessories: elevating leg rests (ELRs), wheel locks, extensions and anti-tippers.   10/11/18 1228   10/11/18 1213  For home use only DME Other see comment  Once    Comments:  Harrel Lemon Lift   10/11/18 1212   10/11/18 1213  For home use only DME 3 n 1  Once     10/11/18 1213

## 2018-10-11 NOTE — NC FL2 (Signed)
[place] LEVEL OF CARE SCREENING TOOL     IDENTIFICATION  Patient Name: Zachary Decker Birthdate: 1933/05/12 Sex: male Admission Date (Current Location): 10/07/2018  [facility] and [place] Number:  Herbalist and Address:  [facility] [place], [place] [address], [place], [place] [address]      Provider Number: [id number]  Attending Physician Name and Address:  Domenic Polite, MD  Relative Name and Phone Number:  [name], Daughter, [phone] & Aeron Donaghey, Wife, [phone]    Current Level of Care: Hospital Recommended Level of Care: [place] Prior Approval Number:    Date Approved/Denied:   PASRR Number: [id number]  Discharge Plan: SNF    Current Diagnoses: Patient Active Problem List   Diagnosis Date Noted  . Cellulitis of left foot   . Cellulitis of left leg 10/07/2018  . Balance disorder 04/13/2018  . Senile purpura ([place]) 04/13/2018  . Insomnia 04/13/2018  . Low back pain 04/19/2015  . CKD (chronic kidney disease), stage III ([place]) 04/20/2014  . Anxiety state 04/17/2014  . CAD w/ hx MI s/p CABG 09/07/2012  . ANEMIA, VITAMIN B12 DEFICIENCY 04/22/2010  . Cardiomyopathy, ischemic 03/22/2010  . Actinic keratosis 06/25/2009  . BASAL CELL CARCINOMA, NOSE 02/05/2009  . Localized osteoarthrosis, lower leg 12/06/2008  . HYPERCHOLESTEROLEMIA 10/31/2008  . BPH (benign prostatic hyperplasia) 09/28/2007  . Hypothyroidism 03/26/2007  . Essential hypertension 03/22/2007  . GERD 03/22/2007    Orientation RESPIRATION BLADDER Height & Weight     Self, Time, Situation, Place  Normal Continent Weight: 185 lb (83.9 kg) Height:  6\' 2"  (188 cm)  BEHAVIORAL SYMPTOMS/MOOD NEUROLOGICAL BOWEL NUTRITION STATUS      Continent Diet(Heart Healthy, thin liquids)  AMBULATORY STATUS COMMUNICATION OF NEEDS Skin   Limited Assist Verbally Skin abrasions, Other (Comment)(Has abrasion on ischial tuberosity (treated with barrier  cream); celluiltis on left lower leg)                       Personal Care Assistance Level of Assistance  Bathing, Feeding, Dressing Bathing Assistance: Limited assistance Feeding assistance: Independent Dressing Assistance: Limited assistance     Functional Limitations Info  Sight, Hearing, Speech Sight Info: Impaired Hearing Info: Adequate Speech Info: Adequate    SPECIAL CARE FACTORS FREQUENCY  PT (By licensed PT), OT (By licensed OT)     PT Frequency: 5x/wk OT Frequency: 5x/wk            Contractures Contractures Info: Not present    Additional Factors Info  Code Status, Allergies Code Status Info: Full Code Allergies Info: Losartan Potassium, Naproxen, Penicillins           Current Medications (10/11/2018):  This is the current hospital active medication list Current Facility-Administered Medications  Medication Dose Route Frequency Provider Last Rate Last Dose  . aspirin EC tablet 81 mg  81 mg Oral Daily Dana Allan I, MD   81 mg at 10/11/18 1021  . carvedilol (COREG) tablet 3.125 mg  3.125 mg Oral BID WC Dana Allan I, MD   3.125 mg at 10/11/18 1020  . cephALEXin (KEFLEX) capsule 250 mg  250 mg Oral Q8H Domenic Polite, MD   250 mg at 10/11/18 2694  . enoxaparin (LOVENOX) injection 40 mg  40 mg Subcutaneous Q24H Dana Allan I, MD   40 mg at 10/10/18 2206  . famotidine (PEPCID) tablet 20 mg  20 mg Oral QHS Bonnell Public, MD   20 mg  at 10/10/18 2207  . levothyroxine (SYNTHROID) tablet 50 mcg  50 mcg Oral Q0600 Dana Allan I, MD   50 mcg at 10/11/18 [phone]  . multivitamin with minerals tablet 1 tablet  1 tablet Oral Daily Dana Allan I, MD   1 tablet at 10/11/18 1021  . simvastatin (ZOCOR) tablet 20 mg  20 mg Oral QHS Dana Allan I, MD   20 mg at 10/10/18 2207  . tamsulosin (FLOMAX) capsule 0.4 mg  0.4 mg Oral QHS Dana Allan I, MD   0.4 mg at 10/10/18 2207     Discharge Medications: Please see discharge  summary for a list of discharge medications.  Relevant Imaging Results:  Relevant Lab Results:   Additional Information SSN: [id number]  Geralynn Ochs, LCSW

## 2018-10-12 ENCOUNTER — Telehealth: Payer: Self-pay | Admitting: Family Medicine

## 2018-10-12 ENCOUNTER — Telehealth: Payer: Self-pay

## 2018-10-12 LAB — CULTURE, BLOOD (ROUTINE X 2)
Culture: NO GROWTH
Culture: NO GROWTH
Special Requests: ADEQUATE

## 2018-10-12 NOTE — Telephone Encounter (Signed)
LM to return call for TCM visit.

## 2018-10-12 NOTE — Telephone Encounter (Signed)
See note

## 2018-10-12 NOTE — Telephone Encounter (Signed)
Copied from [place] [id number]. Topic: Quick Communication - Home Health Verbal Orders >> Oct 12, 2018  2:59 PM Margot Ables wrote: Caller/Agency: Riza PT with [facility] Number: [phone], secure VM Requesting OT/PT/Skilled Nursing/Social Work/Speech Therapy: PT, OT, SN, HH Aide Frequency: PT & OT 2 x week for 4 weeks, SN evaluation, HH Aide 2x week for 3 weeks

## 2018-10-12 NOTE — Telephone Encounter (Signed)
Yes thanks- can provide verbal orders 

## 2018-10-13 ENCOUNTER — Telehealth: Payer: Self-pay

## 2018-10-13 NOTE — Telephone Encounter (Signed)
Called pt per Dr. Ansel Bong request " Appears patient discharged to skilled nursing facility. Can you please call to see how patient is doing? IF he is at skilled nursing facility would be great to see him here a week or two after he is discharged from there. Please convert this to phone note and also document which facility he is in."    Spoke to [place] (pt's spouse) and she stated that the pt is doing well except for edema. Apolonio Schneiders stated she will call back and schedule a virtual visit when their daughter is available due to not having smart phone, tablet or computer.

## 2018-10-13 NOTE — Telephone Encounter (Signed)
Sorry forgot to mention that Apolonio Schneiders stated  the pt was discharged Monday.

## 2018-10-13 NOTE — Telephone Encounter (Signed)
Called Riza and gave verbal orders for all below. Will call if any questions.

## 2018-10-13 NOTE — Telephone Encounter (Signed)
Which facility is he in?  Glad he is doing well  He does not have to have a virtual visit until he gets out of the facility- tell them no rush on this but I want to touch base after that- he should have a doctor following him in the facility

## 2018-10-14 ENCOUNTER — Ambulatory Visit: Payer: Medicare Other | Admitting: Family Medicine

## 2018-10-14 ENCOUNTER — Telehealth: Payer: Self-pay | Admitting: Family Medicine

## 2018-10-14 NOTE — Telephone Encounter (Signed)
Spoke to [place] to see if their daughter left an appt time that she will be free to help. Apolonio Schneiders stated that their daughter Olivia Mackie did not leave a time frame but provided me with Tracy's number [phone]. Called [place] but no answer. I left a detailed message for her to return phone call.

## 2018-10-14 NOTE — Telephone Encounter (Signed)
Pt needs hosp f.u and his daughter is helping him with Doxy. They will not be available tomorrow. Pts daughter stated his social worker will be coming today at 11am and the therapist will be there at 1230. The daughter is leaving for work today at [employer]. Please advise if pt can be worked in for doxy visit today or if it is ok to wait until Monday.

## 2018-10-14 NOTE — Telephone Encounter (Signed)
Dr. Yong Channel, please advise.

## 2018-10-14 NOTE — Telephone Encounter (Signed)
I think Monday will be ok as long as he is improving and it sounded like he was based on last message

## 2018-10-15 NOTE — Telephone Encounter (Signed)
Pt has been scheduled by someone else for 10/18/2018 for Hospital f/u. No further action needed!

## 2018-10-18 ENCOUNTER — Encounter: Payer: Self-pay | Admitting: Family Medicine

## 2018-10-18 ENCOUNTER — Ambulatory Visit (INDEPENDENT_AMBULATORY_CARE_PROVIDER_SITE_OTHER): Payer: Medicare Other | Admitting: Family Medicine

## 2018-10-18 VITALS — BP 120/64 | HR 55

## 2018-10-18 DIAGNOSIS — E034 Atrophy of thyroid (acquired): Secondary | ICD-10-CM

## 2018-10-18 DIAGNOSIS — N183 Chronic kidney disease, stage 3 unspecified: Secondary | ICD-10-CM

## 2018-10-18 DIAGNOSIS — I10 Essential (primary) hypertension: Secondary | ICD-10-CM | POA: Diagnosis not present

## 2018-10-18 DIAGNOSIS — I2510 Atherosclerotic heart disease of native coronary artery without angina pectoris: Secondary | ICD-10-CM | POA: Diagnosis not present

## 2018-10-18 DIAGNOSIS — L03116 Cellulitis of left lower limb: Secondary | ICD-10-CM

## 2018-10-18 DIAGNOSIS — I255 Ischemic cardiomyopathy: Secondary | ICD-10-CM

## 2018-10-18 DIAGNOSIS — Z8619 Personal history of other infectious and parasitic diseases: Secondary | ICD-10-CM

## 2018-10-18 NOTE — Progress Notes (Signed)
Phone [phone]   Subjective:  Virtual visit via Video note. Chief complaint: Chief Complaint  Patient presents with  . Hospitalization Follow-up    This visit type was conducted due to national recommendations for restrictions regarding the COVID-19 Pandemic (e.g. social distancing).  This format is felt to be most appropriate for this patient at this time balancing risks to patient and risks to population by having him in for in person visit.  No physical exam was performed (except for noted visual exam or audio findings with Telehealth visits).    Our team/I connected with Zachary Decker on 10/18/18 at 10:00 AM EDT by a video enabled telemedicine application ([email or web address]) and verified that I am speaking with the correct person using two identifiers.  Location patient: [facility] Location provider: [facility], office Persons participating in the virtual visit:  Patient, daughter helps with history but patient able to provide vast majority.  Daughter also helps with video appointment  Our team/I discussed the limitations of evaluation and management by telemedicine and the availability of in person appointments. In light of current covid-19 pandemic, patient also understands that we are trying to protect them by minimizing in office contact if at all possible.  The patient expressed consent for telemedicine visit and agreed to proceed. Patient understands insurance will be billed.   ROS-no fever or chills.  No reported chest pain.  Left leg swelling and redness have resolved.  Past Medical History-  Patient Active Problem List   Diagnosis Date Noted  . Balance disorder 04/13/2018    Priority: High  . CAD w/ hx MI s/p CABG 09/07/2012    Priority: High  . Cardiomyopathy, ischemic 03/22/2010    Priority: High  . Low back pain 04/19/2015    Priority: Medium  . CKD (chronic kidney disease), stage III ([place]) 04/20/2014    Priority: Medium  . Anxiety state 04/17/2014    Priority: Medium   . HYPERCHOLESTEROLEMIA 10/31/2008    Priority: Medium  . BPH (benign prostatic hyperplasia) 09/28/2007    Priority: Medium  . Hypothyroidism 03/26/2007    Priority: Medium  . Essential hypertension 03/22/2007    Priority: Medium  . Senile purpura ([place]) 04/13/2018    Priority: Low  . ANEMIA, VITAMIN B12 DEFICIENCY 04/22/2010    Priority: Low  . Actinic keratosis 06/25/2009    Priority: Low  . BASAL CELL CARCINOMA, NOSE 02/05/2009    Priority: Low  . Localized osteoarthrosis, lower leg 12/06/2008    Priority: Low  . GERD 03/22/2007    Priority: Low  . Cellulitis of left foot   . Cellulitis of left leg 10/07/2018  . Insomnia 04/13/2018    Medications- reviewed and updated Current Outpatient Medications  Medication Sig Dispense Refill  . Acetaminophen (TYLENOL EXTRA STRENGTH PO) Take 1-2 tablets by mouth daily as needed (for back pain).    Marland Kitchen aspirin 81 MG tablet Take 81 mg by mouth daily.      . B Complex-C-Folic Acid (FOLBEE PLUS) TABS TAKE 1 TABLET BY MOUTH EVERY DAY 90 tablet 1  . carvedilol (COREG) 3.125 MG tablet TAKE 1 TABLET BY MOUTH 2 TIMES A DAY WITH MEAL (Patient taking differently: Take 3.125 mg by mouth 2 (two) times daily with a meal. ) 180 tablet 3  . diclofenac sodium (VOLTAREN) 1 % GEL Apply 4 g topically at bedtime as needed (neck pain).    Marland Kitchen levothyroxine (SYNTHROID, LEVOTHROID) 50 MCG tablet TAKE 1 TABLET (50 MCG TOTAL) BY MOUTH DAILY. (Patient taking differently: Take  50 mcg by mouth daily before breakfast. ) 90 tablet 3  . lisinopril (PRINIVIL,ZESTRIL) 2.5 MG tablet Take 1 tablet (2.5 mg total) by mouth daily. 90 tablet 3  . Melatonin ([employer]S MELATONIN) 5 MG TABS Take 2.5 mg by mouth at bedtime.    . Multiple Vitamins-Minerals (CENTRUM SILVER 50+MEN) TABS Take 1 tablet by mouth daily.    . nitroGLYCERIN (NITROSTAT) 0.4 MG SL tablet Place 1 tablet (0.4 mg total) under the tongue every 5 (five) minutes as needed for chest pain. 20 tablet 1  . OVER THE COUNTER  MEDICATION Take 1 capsule by mouth daily. Probiotic    . simvastatin (ZOCOR) 20 MG tablet Take 1 tablet (20 mg total) by mouth at bedtime. 90 tablet 3  . tamsulosin (FLOMAX) 0.4 MG CAPS capsule TAKE 1 CAPSULE (0.4 MG TOTAL) BY MOUTH DAILY. (Patient taking differently: Take 0.4 mg by mouth at bedtime. ) 90 capsule 3  . vitamin B-12 (CYANOCOBALAMIN) 500 MCG tablet Take 500 mcg by mouth daily.     No current facility-administered medications for this visit.      Objective:  BP 120/64   Pulse (!) 55  Gen: NAD, resting comfortably Lungs: nonlabored, normal respiratory rate  Skin: appears dry, no obvious rash-we did not visualize his legs Patient is much more lucid and able to engage in conversation today.    Assessment and Plan   # Sepsis/encephalopathy due to left lower extremity cellulitis S:ultimately they opted to bring him home after hospitalization. He was hospitalized from 10/07/2018 to 10/11/2018 due to sepsis and metabolic encephalopathy related to lower extremity cellulitis. He feels the leg has improved. He has already finished keflex (he was transitioned to this after being on vancomycin).  Patient initially had a white count above 30,000- this trended back down into normal range.  He was noted to have a macrocytic anemia-this was largely stable during hospitalization.  Did have slight elevation in LFTs- this was not trended through hospital discharge.  Kidney function remained largely stable with GFR in the 50s/CKD stage III-he remains on his low-dose lisinopril.  For his CAD-he was continued on aspirin and statin.  Blood pressure back in the normal range-was as low as the 62X systolic in the hospital- he has back on carvedilol and lisinopril.  Lisinopril was also for ischemic cardiomyopathy which is also been stable.-Followed by cardiology  Feels like he is sleeping well- daughter staying with him at night.   Working with PT and OT at home.  He finds this very helpful-he has had 2  sessions.  Strength is still very low-he states he was not gotten out of his bed for 2 consecutive days on the weekend and that was tough on him.  Family wishes they had been with him to help get him out-they were unable to come in due to COVID-19 A/P: Patient's sepsis and encephalopathy appear to have resolved.  Left lower extremity cellulitis is reportedly resolved- patient completed course of vancomycin and later Keflex.  CKD stage III-stable on last labs  Microcytic anemia-B12 and folate were normal.  Recommended we update his CBC-he declines for now but will consider in the next he feels like he is strong enough to get to the office.  For generalized weakness-recommended continue therapy with PT and OT at home  For hypothyroidism-we will order TSH for future labs as well  For CAD-continue aspirin, statin, Coreg.  Cardiomyopathy is stable on lisinopril and Coreg  Elevated LFTs-we need to repeat these in the future.  I am going to go ahead and order a CBC with differential and LFTs/CMP at this time.  He will call back when he is ready to come do these.  Patient and family will watch out for any recurrence of symptoms and we will schedule another visit follow-up if that occurs.  I will set a reminder to check on him in 2 months to see if he can do labs at that time if he has not already completed them  Future Appointments  Date Time Provider [place]  10/29/2018  4:00 PM Burnell Blanks, MD [facility]  04/19/2019  1:00 PM [facility]  04/19/2019  2:20 PM [name], MD [facility]   Lab/Order associations: Left leg cellulitis - Plan: Comprehensive metabolic panel, CBC with Differential/Platelet  History of sepsis  Essential hypertension - Plan: Comprehensive metabolic panel, CBC with Differential/Platelet  Cardiomyopathy, ischemic - Plan: Comprehensive metabolic panel, CBC with Differential/Platelet  Coronary artery  disease involving native coronary artery of native heart without angina pectoris - Plan: Comprehensive metabolic panel, CBC with Differential/Platelet  Hypothyroidism due to acquired atrophy of thyroid - Plan: TSH  CKD (chronic kidney disease), stage III ([place])  Return precautions advised.  Garret Reddish, MD

## 2018-10-18 NOTE — Patient Instructions (Addendum)
There are no preventive care reminders to display for this patient.  Depression screen [facility] 2/9 10/18/2018 04/13/2018 04/09/2017  Decreased Interest 0 0 0  Down, Depressed, Hopeless 0 0 0  PHQ - 2 Score 0 0 0  Altered sleeping 0 0 -  Tired, decreased energy 0 0 -  Change in appetite 0 0 -  Feeling bad or failure about yourself  0 0 -  Trouble concentrating 0 0 -  Moving slowly or fidgety/restless 0 0 -  Suicidal thoughts 0 0 -  PHQ-9 Score 0 0 -  Difficult doing work/chores Not difficult at all Not difficult at all -   Video visit

## 2018-10-21 ENCOUNTER — Telehealth: Payer: Self-pay | Admitting: Family Medicine

## 2018-10-21 ENCOUNTER — Other Ambulatory Visit: Payer: Self-pay | Admitting: Family Medicine

## 2018-10-21 NOTE — Telephone Encounter (Signed)
Copied from [place] [id number]. Topic: General - Other >> Oct 21, 2018  3:57 PM Keene Breath wrote: Reason for CRM: Katrina with [facility] is calling to report a Stage 1, possibly could lead to a Stage 2 pressure on his coccyx area.  Katrina is call to request an order for treatment.  Please advise and call back at [phone]

## 2018-10-21 NOTE — Telephone Encounter (Signed)
Spoke to [place] and she stated that the area was treated with zinc oxide. Pt went the ED in the past and a " patch" was administered to the area pt tolerated that better. Katrina calling for orders to treat area.

## 2018-10-21 NOTE — Telephone Encounter (Signed)
See note

## 2018-10-21 NOTE — Telephone Encounter (Signed)
May apply tegarderm patch or similar if that is what they are requesting and change every 1-2 days

## 2018-10-22 NOTE — Telephone Encounter (Signed)
Left message to return phone call.

## 2018-10-22 NOTE — Telephone Encounter (Signed)
I left a detailed message for Zachary Decker to call office back.

## 2018-10-22 NOTE — Telephone Encounter (Signed)
Katrina informed of update and verbalized understanding. No further action needed at this time.

## 2018-10-25 ENCOUNTER — Telehealth: Payer: Self-pay | Admitting: *Deleted

## 2018-10-25 NOTE — Telephone Encounter (Signed)
I placed call to pt. His daughter Zachary Decker answered the phone. Pt in the background asked me to speak with Zachary Decker. Office visit changed to virtual visit at 8:30 on 5/7.   Virtual Visit Pre-Appointment Phone Call  "(Name), I am calling you today to discuss your upcoming appointment. We are currently trying to limit exposure to the virus that causes COVID-19 by seeing patients at home rather than in the office."  "What is the BEST phone number to call the day of the visit?" - [phone] 1. Do you have or have access to (through a family member/friend) a smartphone with video capability that we can use for your visit?" a. If yes - list this number in appt notes as cell (if different from BEST phone #) and list the appointment type as a VIDEO visit in appointment notes b. If no - list the appointment type as a PHONE visit in appointment notes  2. Confirm consent - "In the setting of the current Covid19 crisis, you are scheduled for a (phone or video) visit with your provider on (date) at (time).  Just as we do with many in-office visits, in order for you to participate in this visit, we must obtain consent.  If you'd like, I can send this to your mychart (if signed up) or email for you to review.  Otherwise, I can obtain your verbal consent now.  All virtual visits are billed to your insurance company just like a normal visit would be.  By agreeing to a virtual visit, we'd like you to understand that the technology does not allow for your provider to perform an examination, and thus may limit your provider's ability to fully assess your condition. If your provider identifies any concerns that need to be evaluated in person, we will make arrangements to do so.  Finally, though the technology is pretty good, we cannot assure that it will always work on either your or our end, and in the setting of a video visit, we may have to convert it to a phone-only visit.  In either situation, we cannot ensure that we  have a secure connection.  Are you willing to proceed?" STAFF: Did the patient verbally acknowledge consent to telehealth visit? Document YES/NO here: Pt's daughter Zachary Decker gave verbal consent.   3. Advise patient to be prepared - "Two hours prior to your appointment, go ahead and check your blood pressure, pulse, oxygen saturation, and your weight (if you have the equipment to check those) and write them all down. When your visit starts, your provider will ask you for this information. If you have an Apple Watch or Kardia device, please plan to have heart rate information ready on the day of your appointment. Please have a pen and paper handy nearby the day of the visit as well."  4. Give patient instructions for MyChart download to smartphone OR Doximity/Doxy.me as below if video visit (depending on what platform provider is using)  5. Inform patient they will receive a phone call 15 minutes prior to their appointment time (may be from unknown caller ID) so they should be prepared to answer    TELEPHONE CALL NOTE  Zachary Decker has been deemed a candidate for a follow-up tele-health visit to limit community exposure during the Covid-19 pandemic. I spoke with the patient's daughter via phone to ensure availability of phone/video source, confirm preferred email & phone number, and discuss instructions and expectations.  I reminded Zachary Decker to be prepared with any  vital sign and/or heart rhythm information that could potentially be obtained via home monitoring, at the time of his visit. I reminded Zachary Decker to expect a phone call prior to his visit. All information reviewed with pt's daughter.   Leodis [place], RN 10/25/2018 10:19 AM   I  IF USING DOXIMITY or DOXY.ME - The patient will receive a link just prior to their visit by text.     FULL LENGTH CONSENT FOR TELE-HEALTH VISIT   I hereby voluntarily request, consent and authorize [facility] and its employed or  contracted physicians, physician assistants, nurse practitioners or other licensed health care professionals (the Practitioner), to provide me with telemedicine health care services (the Services") as deemed necessary by the treating Practitioner. I acknowledge and consent to receive the Services by the Practitioner via telemedicine. I understand that the telemedicine visit will involve communicating with the Practitioner through live audiovisual communication technology and the disclosure of certain medical information by electronic transmission. I acknowledge that I have been given the opportunity to request an in-person assessment or other available alternative prior to the telemedicine visit and am voluntarily participating in the telemedicine visit.  I understand that I have the right to withhold or withdraw my consent to the use of telemedicine in the course of my care at any time, without affecting my right to future care or treatment, and that the Practitioner or I may terminate the telemedicine visit at any time. I understand that I have the right to inspect all information obtained and/or recorded in the course of the telemedicine visit and may receive copies of available information for a reasonable fee.  I understand that some of the potential risks of receiving the Services via telemedicine include:   Delay or interruption in medical evaluation due to technological equipment failure or disruption;  Information transmitted may not be sufficient (e.g. poor resolution of images) to allow for appropriate medical decision making by the Practitioner; and/or   In rare instances, security protocols could fail, causing a breach of personal health information.  Furthermore, I acknowledge that it is my responsibility to provide information about my medical history, conditions and care that is complete and accurate to the best of my ability. I acknowledge that Practitioner's advice, recommendations,  and/or decision may be based on factors not within their control, such as incomplete or inaccurate data provided by me or distortions of diagnostic images or specimens that may result from electronic transmissions. I understand that the practice of medicine is not an exact science and that Practitioner makes no warranties or guarantees regarding treatment outcomes. I acknowledge that I will receive a copy of this consent concurrently upon execution via email to the email address I last provided but may also request a printed copy by calling the office of [facility].    I understand that my insurance will be billed for this visit.   I have read or had this consent read to me.  I understand the contents of this consent, which adequately explains the benefits and risks of the Services being provided via telemedicine.   I have been provided ample opportunity to ask questions regarding this consent and the Services and have had my questions answered to my satisfaction.  I give my informed consent for the services to be provided through the use of telemedicine in my medical care  By participating in this telemedicine visit I agree to the above.

## 2018-10-27 DIAGNOSIS — K219 Gastro-esophageal reflux disease without esophagitis: Secondary | ICD-10-CM

## 2018-10-27 DIAGNOSIS — E039 Hypothyroidism, unspecified: Secondary | ICD-10-CM

## 2018-10-27 DIAGNOSIS — I739 Peripheral vascular disease, unspecified: Secondary | ICD-10-CM

## 2018-10-27 DIAGNOSIS — I129 Hypertensive chronic kidney disease with stage 1 through stage 4 chronic kidney disease, or unspecified chronic kidney disease: Secondary | ICD-10-CM | POA: Diagnosis not present

## 2018-10-27 DIAGNOSIS — G8929 Other chronic pain: Secondary | ICD-10-CM

## 2018-10-27 DIAGNOSIS — D510 Vitamin B12 deficiency anemia due to intrinsic factor deficiency: Secondary | ICD-10-CM

## 2018-10-27 DIAGNOSIS — Z7982 Long term (current) use of aspirin: Secondary | ICD-10-CM

## 2018-10-27 DIAGNOSIS — Z9181 History of falling: Secondary | ICD-10-CM

## 2018-10-27 DIAGNOSIS — E7800 Pure hypercholesterolemia, unspecified: Secondary | ICD-10-CM

## 2018-10-27 DIAGNOSIS — Z955 Presence of coronary angioplasty implant and graft: Secondary | ICD-10-CM

## 2018-10-27 DIAGNOSIS — N401 Enlarged prostate with lower urinary tract symptoms: Secondary | ICD-10-CM

## 2018-10-27 DIAGNOSIS — I472 Ventricular tachycardia: Secondary | ICD-10-CM

## 2018-10-27 DIAGNOSIS — Z792 Long term (current) use of antibiotics: Secondary | ICD-10-CM

## 2018-10-27 DIAGNOSIS — L03116 Cellulitis of left lower limb: Secondary | ICD-10-CM | POA: Diagnosis not present

## 2018-10-27 DIAGNOSIS — N138 Other obstructive and reflux uropathy: Secondary | ICD-10-CM

## 2018-10-27 DIAGNOSIS — G4700 Insomnia, unspecified: Secondary | ICD-10-CM

## 2018-10-27 DIAGNOSIS — I2510 Atherosclerotic heart disease of native coronary artery without angina pectoris: Secondary | ICD-10-CM

## 2018-10-27 DIAGNOSIS — N183 Chronic kidney disease, stage 3 (moderate): Secondary | ICD-10-CM

## 2018-10-27 DIAGNOSIS — M47812 Spondylosis without myelopathy or radiculopathy, cervical region: Secondary | ICD-10-CM | POA: Diagnosis not present

## 2018-10-27 NOTE — Progress Notes (Signed)
Virtual Visit via Video Note   This visit type was conducted due to national recommendations for restrictions regarding the COVID-19 Pandemic (e.g. social distancing) in an effort to limit this patient's exposure and mitigate transmission in our community.  Due to his co-morbid illnesses, this patient is at least at moderate risk for complications without adequate follow up.  This format is felt to be most appropriate for this patient at this time.  All issues noted in this document were discussed and addressed.  A limited physical exam was performed with this format.  Please refer to the patient's chart for his consent to telehealth for [facility].   Date:  10/28/2018   ID:  Zachary Decker, DOB 11-30-1932, MRN [id number]  Patient Location: Home Provider Location: Home  PCP:  [name], MD  Cardiologist:  Lauree Chandler, MD  Electrophysiologist:  None   Evaluation Performed:  Follow-Up Visit  Chief Complaint:  Follow up CAD  History of Present Illness:    Zachary Decker is a 85 y.o. male with history of HTN, HLD, CAD s/p 5V CABG March 2010, ischemic cardiomyopathy, GERD and BPH who is being seen today by virtual e-visit due to the Zachary Decker pandemic. He was admitted in March 2010 with an inferior MI complicated by VF arrest multiple times and unsuccessful attempt at PCI in the setting of severe multi-vessel CAD. He then had an urgent 5 V CABG. He is known to have a cardiomyopathy with last echo 2013 showing LVEF 35-40%. Stress myoview 03/08/13 with scar but no ischemia, LVEF=46%. He has refused to repeat the echo over the past few years. He was admitted to [facility] April 2020 with left leg cellulitis and sepsis with generalized weakness.   He tells me today that he feels much better following his recent treatment for cellulitis. No chest pain or dyspnea. No LE edema.   The patient does not have symptoms concerning for COVID-19 infection (fever, chills, cough, or new  shortness of breath).    Past Medical History:  Diagnosis Date   Acute myocardial infarction of inferoposterior wall, subsequent episode of care [facility])    CAD (coronary artery disease)    Encounter for long-term (current) use of other medications    Family history of malignant neoplasm of gastrointestinal tract    GERD (gastroesophageal reflux disease)    Hypercholesterolemia    Hypertension    Hypertrophy of prostate with urinary obstruction and other lower urinary tract symptoms (LUTS)    Personal history of thrombophlebitis    PVD (peripheral vascular disease) (HCC)    Respiratory failure (HCC)    Unspecified adverse effect of unspecified drug, medicinal and biological substance    Unspecified hypothyroidism    Past Surgical History:  Procedure Laterality Date   CHOLECYSTECTOMY  02/26/2012   Procedure: LAPAROSCOPIC CHOLECYSTECTOMY WITH INTRAOPERATIVE CHOLANGIOGRAM;  Surgeon: Madilyn Hook, DO;  Location: [place];  Service: General;  Laterality: N/A;   CORONARY ARTERY BYPASS GRAFT     x 5 on August 29, 2008   KNEE SURGERY     tachecostomy placement     after heart attack   TONSILLECTOMY       Current Meds  Medication Sig   Acetaminophen (TYLENOL EXTRA STRENGTH PO) Take 1-2 tablets by mouth daily as needed (for back pain).   aspirin 81 MG tablet Take 81 mg by mouth daily.     B Complex-C-Folic Acid (FOLBEE PLUS) TABS TAKE 1 TABLET BY MOUTH EVERY DAY  carvedilol (COREG) 3.125 MG tablet TAKE 1 TABLET BY MOUTH 2 TIMES A DAY WITH MEAL (Patient taking differently: Take 3.125 mg by mouth 2 (two) times daily with a meal. )   diclofenac sodium (VOLTAREN) 1 % GEL Apply 4 g topically at bedtime as needed (neck pain).   famotidine (PEPCID) 20 MG tablet Take 20 mg by mouth as needed for heartburn or indigestion.    levothyroxine (SYNTHROID) 50 MCG tablet Take 1 tablet (50 mcg total) by mouth daily before breakfast.   lisinopril (PRINIVIL,ZESTRIL) 2.5 MG tablet Take 1  tablet (2.5 mg total) by mouth daily.   Melatonin ([employer] MELATONIN) 5 MG TABS Take 2.5 mg by mouth at bedtime.   Multiple Vitamins-Minerals (CENTRUM SILVER 50+MEN) TABS Take 1 tablet by mouth daily.   nitroGLYCERIN (NITROSTAT) 0.4 MG SL tablet Place 1 tablet (0.4 mg total) under the tongue every 5 (five) minutes as needed for chest pain.   OVER THE COUNTER MEDICATION Take 1 capsule by mouth daily. Probiotic   simvastatin (ZOCOR) 20 MG tablet Take 1 tablet (20 mg total) by mouth at bedtime.   tamsulosin (FLOMAX) 0.4 MG CAPS capsule TAKE 1 CAPSULE (0.4 MG TOTAL) BY MOUTH DAILY. (Patient taking differently: Take 0.4 mg by mouth at bedtime. )   vitamin B-12 (CYANOCOBALAMIN) 500 MCG tablet Take 500 mcg by mouth daily.     Allergies:   Losartan potassium; Naproxen; and Penicillins   Social History   Tobacco Use   Smoking status: Never Smoker   Smokeless tobacco: Never Used  Substance Use Topics   Alcohol use: No   Drug use: No     Family Hx: The patient's family history includes Heart disease in his mother; Tuberculosis in his father.  ROS:   Please see the history of present illness.    All other systems reviewed and are negative.   Prior CV studies:   The following studies were reviewed today:  Echo 09/08/11:  Left ventricle: The cavity size was normal. Wall thickness was normal. Systolic function was moderately reduced. The estimated ejection fraction was in the range of 35% to 40%. There is akinesis of the inferoposterior myocardium. Doppler parameters are consistent with abnormal left ventricular relaxation (grade 1 diastolic dysfunction). - Mitral valve: Mild regurgitation. - Left atrium: The atrium was mildly dilated.  Labs/Other Tests and Data Reviewed:    EKG:  No ECG reviewed.  Recent Labs: 10/08/2018: ALT 26 10/11/2018: BUN 21; Creatinine, Ser 1.21; Hemoglobin 10.5; Platelets 197; Potassium 4.1; Sodium 138   Recent Lipid Panel Lab Results  Component  Value Date/Time   CHOL 114 10/08/2017 10:48 AM   TRIG 54.0 10/08/2017 10:48 AM   HDL 43.40 10/08/2017 10:48 AM   CHOLHDL 3 10/08/2017 10:48 AM   LDLCALC 60 10/08/2017 10:48 AM   LDLDIRECT 60.0 04/08/2016 09:25 AM    Wt Readings from Last 3 Encounters:  10/28/18 185 lb (83.9 kg)  10/07/18 185 lb (83.9 kg)  10/07/18 185 lb (83.9 kg)     Objective:    Vital Signs:  BP 124/66    Pulse (!) 55    Ht 6\' 2"  (1.88 m)    Wt 185 lb (83.9 kg)    BMI 23.75 kg/m    VITAL SIGNS:  reviewed GEN:  no acute distress  ASSESSMENT & PLAN:    1. CAD s/p CABG without angina: He has no chest pain. Continue ASA, statin and beta blocker.       2. Ischemic Cardiomyopathy:  Last LVEF  46% by stress myoview 2014. He has no weight gain or LE edema. He has not wished to repeat his echo. Will continue Ace-inh and beta blocker.     3. HTN: BP is controlled at home.   4. Hyperlipidemia: Lipids followed in primary care. Last LDL at goal in April 2019. Will need repeat lipids in primary care at next visit. Continue statin.   COVID-19 Education: The signs and symptoms of COVID-19 were discussed with the patient and how to seek care for testing (follow up with PCP or arrange E-visit).  The importance of social distancing was discussed today.  Time:   Today, I have spent 15 minutes with the patient with telehealth technology discussing the above problems.     Medication Adjustments/Labs and Tests Ordered: Current medicines are reviewed at length with the patient today.  Concerns regarding medicines are outlined above.   Tests Ordered: No orders of the defined types were placed in this encounter.   Medication Changes: No orders of the defined types were placed in this encounter.   Disposition:  Follow up in 6 month(s)  Signed, Lauree Chandler, MD  10/28/2018 8:37 AM    [facility]

## 2018-10-28 ENCOUNTER — Encounter: Payer: Self-pay | Admitting: Cardiovascular Disease

## 2018-10-28 ENCOUNTER — Telehealth (INDEPENDENT_AMBULATORY_CARE_PROVIDER_SITE_OTHER): Payer: Medicare Other | Admitting: Cardiovascular Disease

## 2018-10-28 ENCOUNTER — Other Ambulatory Visit: Payer: Self-pay

## 2018-10-28 VITALS — BP 124/66 | HR 55 | Ht 74.0 in | Wt 185.0 lb

## 2018-10-28 DIAGNOSIS — I2510 Atherosclerotic heart disease of native coronary artery without angina pectoris: Secondary | ICD-10-CM

## 2018-10-28 DIAGNOSIS — E7800 Pure hypercholesterolemia, unspecified: Secondary | ICD-10-CM

## 2018-10-28 DIAGNOSIS — I255 Ischemic cardiomyopathy: Secondary | ICD-10-CM

## 2018-10-28 DIAGNOSIS — I10 Essential (primary) hypertension: Secondary | ICD-10-CM

## 2018-10-28 NOTE — Patient Instructions (Signed)

## 2018-10-29 ENCOUNTER — Telehealth: Payer: Self-pay | Admitting: Family Medicine

## 2018-10-29 ENCOUNTER — Ambulatory Visit: Payer: Medicare Other | Admitting: Cardiovascular Disease

## 2018-10-29 NOTE — Telephone Encounter (Signed)
See note

## 2018-10-29 NOTE — Telephone Encounter (Signed)
Copied from [place] [phone]. Topic: Quick Communication - Home Health Verbal Orders >> Oct 29, 2018  2:28 PM Berneta Levins wrote: Caller/Agency: Stanton Kidney with [facility] Number: [phone], OK to leave a message Requesting OT/PT/Skilled Nursing/Social Work/Speech Therapy: OT Frequency: 2x for 1 week, 1x for 2 weeks

## 2018-11-01 NOTE — Telephone Encounter (Signed)
Okay for vo  Please advise

## 2018-11-01 NOTE — Telephone Encounter (Signed)
Okay for verbal order as requested

## 2018-11-02 NOTE — Telephone Encounter (Signed)
I left a detailed message giving Stanton Kidney the okay for orders, per Dr. Yong Channel.

## 2018-11-03 ENCOUNTER — Other Ambulatory Visit: Payer: Self-pay | Admitting: Family Medicine

## 2018-11-09 ENCOUNTER — Encounter: Payer: Self-pay | Admitting: Family Medicine

## 2018-11-10 NOTE — Progress Notes (Signed)
Zachary Decker  Phone [phone]   Subjective:  Virtual visit via Video note. Chief complaint: Chief Complaint  Patient presents with  . Insomnia   This visit type was conducted due to national recommendations for restrictions regarding the COVID-19 Pandemic (e.g. social distancing).  This format is felt to be most appropriate for this patient at this time balancing risks to patient and risks to population by having him in for in person visit.  No physical exam was performed (except for noted visual exam or audio findings with Telehealth visits).    Our team/I connected with [name] at  9:40 AM EDT by a video enabled telemedicine application ([email or web address] or caregility through epic) and verified that I am speaking with the correct person using two identifiers.  Location patient: Home-[place] Location provider: [facility], office Persons participating in the virtual visit:  patient  Our team/I discussed the limitations of evaluation and management by telemedicine and the availability of in person appointments. In light of current covid-19 pandemic, patient also understands that we are trying to protect them by minimizing in office contact if at all possible.  The patient expressed consent for telemedicine visit and agreed to proceed. Patient understands insurance will be billed.   ROS- strength is improving. Still has some fatigue. Leg has improved (prior cellulitis). No reported chest pain.    Past Medical History-  Patient Active Problem List   Diagnosis Date Noted  . Balance disorder 04/13/2018    Priority: High  . CAD w/ hx MI s/p CABG 09/07/2012    Priority: High  . Cardiomyopathy, ischemic 03/22/2010    Priority: High  . Low back pain 04/19/2015    Priority: Medium  . CKD (chronic kidney disease), stage III ([place]) 04/20/2014    Priority: Medium  . Anxiety state 04/17/2014    Priority: Medium  . HYPERCHOLESTEROLEMIA 10/31/2008    Priority: Medium  . BPH (benign prostatic hyperplasia)  09/28/2007    Priority: Medium  . Hypothyroidism 03/26/2007    Priority: Medium  . Essential hypertension 03/22/2007    Priority: Medium  . Senile purpura ([place]) 04/13/2018    Priority: Low  . ANEMIA, VITAMIN B12 DEFICIENCY 04/22/2010    Priority: Low  . Actinic keratosis 06/25/2009    Priority: Low  . BASAL CELL CARCINOMA, NOSE 02/05/2009    Priority: Low  . Localized osteoarthrosis, lower leg 12/06/2008    Priority: Low  . GERD 03/22/2007    Priority: Low  . Cellulitis of left foot   . Cellulitis of left leg 10/07/2018  . Insomnia 04/13/2018    Medications- reviewed and updated Current Outpatient Medications  Medication Sig Dispense Refill  . Acetaminophen (TYLENOL EXTRA STRENGTH PO) Take 1-2 tablets by mouth daily as needed (for back pain).    Marland Kitchen aspirin 81 MG tablet Take 81 mg by mouth daily.      . B Complex-C-Folic Acid (FOLBEE PLUS) TABS TAKE 1 TABLET BY MOUTH EVERY DAY 90 tablet 1  . carvedilol (COREG) 3.125 MG tablet TAKE 1 TABLET BY MOUTH 2 TIMES A DAY WITH MEAL (Patient taking differently: Take 3.125 mg by mouth 2 (two) times daily with a meal. ) 180 tablet 3  . diclofenac sodium (VOLTAREN) 1 % GEL Apply 4 g topically at bedtime as needed (neck pain).    . famotidine (PEPCID) 20 MG tablet Take 20 mg by mouth as needed for heartburn or indigestion.     Marland Kitchen levothyroxine (SYNTHROID) 50 MCG tablet Take 1 tablet (50 mcg total)  by mouth daily before breakfast. 90 tablet 1  . lisinopril (PRINIVIL,ZESTRIL) 2.5 MG tablet Take 1 tablet (2.5 mg total) by mouth daily. 90 tablet 3  . Melatonin (CVS MELATONIN) 5 MG TABS Take 2.5 mg by mouth at bedtime.    . Multiple Vitamins-Minerals (CENTRUM SILVER 50+MEN) TABS Take 1 tablet by mouth daily.    . nitroGLYCERIN (NITROSTAT) 0.4 MG SL tablet Place 1 tablet (0.4 mg total) under the tongue every 5 (five) minutes as needed for chest pain. 20 tablet 1  . OVER THE COUNTER MEDICATION Take 1 capsule by mouth daily. Probiotic    . simvastatin  (ZOCOR) 20 MG tablet Take 1 tablet (20 mg total) by mouth at bedtime. 90 tablet 3  . tamsulosin (FLOMAX) 0.4 MG CAPS capsule TAKE 1 CAPSULE (0.4 MG TOTAL) BY MOUTH DAILY. (Patient taking differently: Take 0.4 mg by mouth at bedtime. ) 90 capsule 3  . vitamin B-12 (CYANOCOBALAMIN) 500 MCG tablet Take 500 mcg by mouth daily.    . diazepam (VALIUM) 2 MG tablet Take 0.5-1 tablets (1-2 mg total) by mouth at bedtime as needed. For anxiety. Not to take along with trazodone- choose one or the other. 30 tablet 0  . traZODone (DESYREL) 50 MG tablet Take 0.5-1 tablets (25-50 mg total) by mouth at bedtime as needed for sleep. 30 tablet 3   No current facility-administered medications for this visit.      Objective:  BP (!) 112/50   Pulse (!) 46   Ht 6\' 2"  (1.88 m)   Wt 185 lb (83.9 kg)   BMI 23.75 kg/m  self reported vitals Gen: NAD, resting comfortably in his chair- less fatigued then when he originally called in on day cellulitis was disccoved- appears somewhat fatigued Lungs: nonlabored, normal respiratory rate  Skin: appears dry, no obvious rash    Assessment and Plan   # Insomnia S:patient goes to bed at 11 PM and goes right to sleep and at 1 AM wakes up every morning and cannot go back to sleep.  Issues started 4-5 months ago. Was taking half a diazepam and would help. Most of the time if he takes diazepam- sleeps better or longer. Taking about every other night- #22 pills given in October. Also doing 10 mg melatonin at bedtime A/P:  I think a safer option would be trazodone over regularly using diazepam.  I dont think low dose trazodone will worsen bradycardia but they will follow- if gets into lower 40s may need to stop or could possible reduce coreg.  Will try 1. Trazodone 25mg  2. If not effective- trazodone 50mg  3. If not effective- trazodone 50mg  with melatonin 5 mg  4. Did refill valium for half tablet sparing use- discussed tolerance issues and fall risk issues  Other notes: 1.1  more therapy session with PT. Getting stronger after cellulitis   Future Appointments  Date Time Provider [place]  04/19/2019  1:00 PM [facility] COACH [facility]  04/19/2019  2:20 PM Yong Channel Brayton Mars, MD [facility]   Lab/Order associations: Insomnia, unspecified type  Meds ordered this encounter  Medications  . diazepam (VALIUM) 2 MG tablet    Sig: Take 0.5-1 tablets (1-2 mg total) by mouth at bedtime as needed. For anxiety. Not to take along with trazodone- choose one or the other.    Dispense:  30 tablet    Refill:  0  . traZODone (DESYREL) 50 MG tablet    Sig: Take 0.5-1 tablets (25-50 mg total) by mouth  at bedtime as needed for sleep.    Dispense:  30 tablet    Refill:  3   Return precautions advised.  Garret Reddish, MD

## 2018-11-11 ENCOUNTER — Ambulatory Visit (INDEPENDENT_AMBULATORY_CARE_PROVIDER_SITE_OTHER): Payer: Medicare Other | Admitting: Family Medicine

## 2018-11-11 ENCOUNTER — Encounter: Payer: Self-pay | Admitting: Family Medicine

## 2018-11-11 VITALS — BP 112/50 | HR 46 | Ht 74.0 in | Wt 185.0 lb

## 2018-11-11 DIAGNOSIS — G4700 Insomnia, unspecified: Secondary | ICD-10-CM | POA: Diagnosis not present

## 2018-11-11 MED ORDER — TRAZODONE HCL 50 MG PO TABS: 25.0000 mg | ORAL_TABLET | Freq: Every evening | ORAL | 3 refills | Status: DC | PRN

## 2018-11-11 MED ORDER — DIAZEPAM 2 MG PO TABS: 1.0000 mg | ORAL_TABLET | Freq: Every evening | ORAL | 0 refills | Status: DC | PRN

## 2018-11-11 NOTE — Assessment & Plan Note (Signed)
S:patient goes to bed at 11 PM and goes right to sleep and at 1 AM wakes up every morning and cannot go back to sleep.  Issues started 4-5 months ago. Was taking half a diazepam and would help. Most of the time if he takes diazepam- sleeps better or longer. Taking about every other night- #22 pills given in October. Also doing 10 mg melatonin at bedtime A/P:  I think a safer option would be trazodone over regularly using diazepam.  I dont think low dose trazodone will worsen bradycardia but they will follow- if gets into lower 40s may need to stop or could possible reduce coreg.  Will try 1. Trazodone 25mg  2. If not effective- trazodone 50mg  3. If not effective- trazodone 50mg  with melatonin 5 mg  4. Did refill valium for half tablet sparing use- discussed tolerance issues and fall risk issues

## 2018-11-11 NOTE — Patient Instructions (Addendum)
There are no preventive care reminders to display for this patient.  Depression screen [facility] 2/9 11/11/2018 10/18/2018 04/13/2018  Decreased Interest 0 0 0  Down, Depressed, Hopeless 0 0 0  PHQ - 2 Score 0 0 0  Altered sleeping 3 0 0  Tired, decreased energy 0 0 0  Change in appetite 0 0 0  Feeling bad or failure about yourself  0 0 0  Trouble concentrating 0 0 0  Moving slowly or fidgety/restless 0 0 0  Suicidal thoughts 0 0 0  PHQ-9 Score 3 0 0  Difficult doing work/chores Somewhat difficult Not difficult at all Not difficult at all

## 2018-12-03 ENCOUNTER — Other Ambulatory Visit: Payer: Self-pay | Admitting: Family Medicine

## 2018-12-03 NOTE — Telephone Encounter (Signed)
Last OV 11/11/18

## 2018-12-31 ENCOUNTER — Encounter: Payer: Self-pay | Admitting: Family Medicine

## 2018-12-31 ENCOUNTER — Telehealth (INDEPENDENT_AMBULATORY_CARE_PROVIDER_SITE_OTHER): Payer: Medicare Other | Admitting: Family Medicine

## 2018-12-31 ENCOUNTER — Telehealth: Payer: Self-pay | Admitting: Family Medicine

## 2018-12-31 DIAGNOSIS — M7989 Other specified soft tissue disorders: Secondary | ICD-10-CM

## 2018-12-31 MED ORDER — CEPHALEXIN 250 MG PO CAPS: 250.0000 mg | ORAL_CAPSULE | Freq: Four times a day (QID) | ORAL | 0 refills | Status: DC

## 2018-12-31 NOTE — Telephone Encounter (Signed)
See note

## 2018-12-31 NOTE — Telephone Encounter (Signed)
Spoke with daughter Olivia Mackie. Will call Mr. Renly tonight or tomorrow morning and we can do a phone visit - went ahead and started keflex

## 2018-12-31 NOTE — Patient Instructions (Signed)
There are no preventive care reminders to display for this patient.  Depression screen [facility] 2/9 11/11/2018 10/18/2018 04/13/2018  Decreased Interest 0 0 0  Down, Depressed, Hopeless 0 0 0  PHQ - 2 Score 0 0 0  Altered sleeping 3 0 0  Tired, decreased energy 0 0 0  Change in appetite 0 0 0  Feeling bad or failure about yourself  0 0 0  Trouble concentrating 0 0 0  Moving slowly or fidgety/restless 0 0 0  Suicidal thoughts 0 0 0  PHQ-9 Score 3 0 0  Difficult doing work/chores Somewhat difficult Not difficult at all Not difficult at all

## 2018-12-31 NOTE — Telephone Encounter (Signed)
Dr. Yong Channel made aware of pt issue. Zachary Decker was informed at 520pm that we still had a pt but we will get back to her today.

## 2018-12-31 NOTE — Progress Notes (Signed)
Phone [phone]   Subjective:  Virtual visit via phonenote No chief complaint on file.  This visit type was conducted due to national recommendations for restrictions regarding the COVID-19 Pandemic (e.g. social distancing).  This format is felt to be most appropriate for this patient at this time balancing risks to patient and risks to population by having him in for in person visit.  All issues noted in this document were discussed and addressed.  No physical exam was performed (except for noted visual exam or audio findings with Telehealth visits).  The patient has consented to conduct a Telehealth visit and understands insurance will be billed.   Our team/I connected with Zachary Decker at  by phone (patient did not have equipment for webex) and verified that I am speaking with the correct person using two identifiers.  Location patient: Home-[facility] Location provider: [facility], office Persons participating in the virtual visit:  patient  Time on phone: 13 minutes Counseling provided about particular emergency room precautions with new or worsening symptoms  Our team/I discussed the limitations of evaluation and management by telemedicine and the availability of in person appointments. In light of current covid-19 pandemic, patient also understands that we are trying to protect them by minimizing in office contact if at all possible.  The patient expressed consent for telemedicine visit and agreed to proceed. Patient understands insurance will be billed.   ROS- no fever or chills. Has some fatigue. No nausea or vomiting. Feels like leaning on walker more heavily but still able to walk  Appetite is normal.   Past Medical History-  Patient Active Problem List   Diagnosis Date Noted  . Balance disorder 04/13/2018    Priority: High  . CAD w/ hx MI s/p CABG 09/07/2012    Priority: High  . Cardiomyopathy, ischemic 03/22/2010    Priority: High  . Low back pain 04/19/2015    Priority:  Medium  . CKD (chronic kidney disease), stage III ([place]) 04/20/2014    Priority: Medium  . Anxiety state 04/17/2014    Priority: Medium  . HYPERCHOLESTEROLEMIA 10/31/2008    Priority: Medium  . BPH (benign prostatic hyperplasia) 09/28/2007    Priority: Medium  . Hypothyroidism 03/26/2007    Priority: Medium  . Essential hypertension 03/22/2007    Priority: Medium  . Senile purpura ([place]) 04/13/2018    Priority: Low  . ANEMIA, VITAMIN B12 DEFICIENCY 04/22/2010    Priority: Low  . Actinic keratosis 06/25/2009    Priority: Low  . BASAL CELL CARCINOMA, NOSE 02/05/2009    Priority: Low  . Localized osteoarthrosis, lower leg 12/06/2008    Priority: Low  . GERD 03/22/2007    Priority: Low  . Cellulitis of left foot   . Cellulitis of left leg 10/07/2018  . Insomnia 04/13/2018    Medications- reviewed and updated Current Outpatient Medications  Medication Sig Dispense Refill  . Acetaminophen (TYLENOL EXTRA STRENGTH PO) Take 1-2 tablets by mouth daily as needed (for back pain).    Marland Kitchen aspirin 81 MG tablet Take 81 mg by mouth daily.      . B Complex-C-Folic Acid (FOLBEE PLUS) TABS TAKE 1 TABLET BY MOUTH EVERY DAY 90 tablet 1  . carvedilol (COREG) 3.125 MG tablet TAKE 1 TABLET BY MOUTH 2 TIMES A DAY WITH MEAL (Patient taking differently: Take 3.125 mg by mouth 2 (two) times daily with a meal. ) 180 tablet 3  . cephALEXin (KEFLEX) 250 MG capsule Take 1 capsule (250 mg total) by mouth  4 (four) times daily for 7 days. 28 capsule 0  . diazepam (VALIUM) 2 MG tablet Take 0.5-1 tablets (1-2 mg total) by mouth at bedtime as needed. For anxiety. Not to take along with trazodone- choose one or the other. 30 tablet 0  . diclofenac sodium (VOLTAREN) 1 % GEL Apply 4 g topically at bedtime as needed (neck pain).    . famotidine (PEPCID) 20 MG tablet Take 20 mg by mouth as needed for heartburn or indigestion.     Marland Kitchen levothyroxine (SYNTHROID) 50 MCG tablet Take 1 tablet (50 mcg total) by mouth daily before  breakfast. 90 tablet 1  . lisinopril (PRINIVIL,ZESTRIL) 2.5 MG tablet Take 1 tablet (2.5 mg total) by mouth daily. 90 tablet 3  . Melatonin (CVS MELATONIN) 5 MG TABS Take 2.5 mg by mouth at bedtime.    . Multiple Vitamins-Minerals (CENTRUM SILVER 50+MEN) TABS Take 1 tablet by mouth daily.    . nitroGLYCERIN (NITROSTAT) 0.4 MG SL tablet Place 1 tablet (0.4 mg total) under the tongue every 5 (five) minutes as needed for chest pain. 20 tablet 1  . OVER THE COUNTER MEDICATION Take 1 capsule by mouth daily. Probiotic    . simvastatin (ZOCOR) 20 MG tablet Take 1 tablet (20 mg total) by mouth at bedtime. 90 tablet 3  . tamsulosin (FLOMAX) 0.4 MG CAPS capsule TAKE 1 CAPSULE (0.4 MG TOTAL) BY MOUTH DAILY. (Patient taking differently: Take 0.4 mg by mouth at bedtime. ) 90 capsule 3  . traZODone (DESYREL) 50 MG tablet TAKE 0.5-1 TABLETS (25-50 MG TOTAL) BY MOUTH AT BEDTIME AS NEEDED FOR SLEEP. 90 tablet 1  . vitamin B-12 (CYANOCOBALAMIN) 500 MCG tablet Take 500 mcg by mouth daily.       Objective:  Temp 98.1 F (36.7 C)  self reported vitals  Nonlabored voice, normal speech        Assessment and Plan   #Left leg warm, swollen, red S: Patient noted starting Monday or Tuesday that legs felt weaker. THis is similar to how he felt when had cellulitis in april. Then noted left lower leg has some redness and swelling- does have a few minor abrasions on that leg which could be point of entry. THey sent me a mychart message which I was able to view.   No fever/chills/nausea/vomiting. Does not feel weak overall but has more trouble walking- leaning on his walker more. Left leg was the one that had infection last time as well.   I spoke with patient and his daughter- they both reported weakness with walking and concern he may need home health again potentially. Lower legs feel particularly heavy. Left leg somewhat warm. No pain with palpation.  A/P: Concern for  cellulitis left leg given similar's  presentation a few months ago where patient had progressive heaviness in his legs and difficulty walking-responded to antibiotic treatment at that time and was sent home on Keflex.  We are going to treat him with Keflex 250 mg 4 times a day with the plan to follow back up on Monday for video visit to make sure improving.  We may need to get physical therapy involved again depending on how his weakness response to treatment.  I advised him to seek care in the emergency room if he has new or worsening symptoms- I do not think this represents a DVT/PE this based on prior history and similarity of symptoms as well as no clear cut calf pain but it remains in the differential-patient has been get  short of breath or worsening swelling to go to the emergency room. - Given several abrasions on the legs-I wonder if this was a point of entry for bacterial infection - Given prior cellulitis led to hospitalization/inability to get even into our office without assist we opted to be aggressive and go ahead and treat this evening  With weakness I was also concerned on his blood pressure- they will check BP tomorrow- having trouble finding cuff. Recommend hopefully BP at least 110/60-may need to reduce blood pressure short-term-they can call the nurse/after-hours line if needed  Future Appointments  Date Time Provider [place]  01/07/2019  8:40 AM [name], MD [facility]  04/19/2019  1:00 PM [facility] HEALTH COACH [facility]  04/19/2019  2:20 PM [name], MD [facility]   Lab/Order associations:   ICD-10-CM   1. Swollen leg  M79.89    Return precautions advised.  Garret Reddish, MD

## 2018-12-31 NOTE — Telephone Encounter (Signed)
Called and spoke with pt wife. She elevated his feet for an hour and then she noticed some increased warmth and erythema in the left leg. This is the same leg that he had cellulitis earlier this year. He is not running a fever, temp was 98.1. She denies pitting or weeping. Per earlier [employer], he is starting to feel weak again and not wanting to walk. Wife is requesting that rx for Cephalexin be sent in just in case he is developing cellulitis again. This is what was prescribed after he was d/c from the hospital earlier this year. Daughter is going to send a picture of his left leg through MyChart.   Pt scheduled for next Friday earlier today, this was the soonest available appointment. Also placed on cancellation list. Advised to go to urgent care or ED of sx worsen prior to appointment.   Advised that I will forward to Dr. Yong Channel to advise and have Tillie Rung call back with recommendations by the end of the day today as I will be out of the office until Monday.

## 2018-12-31 NOTE — Telephone Encounter (Signed)
Dr. Yong Channel,  See MyChart message from pt daughter Olivia Mackie. He c/o bilateral leg weakness. No sx of cellulitis. Scheduled for next Friday and will be placed on cancellation list.

## 2018-12-31 NOTE — Telephone Encounter (Signed)
I told patient I would see him virtually at 9 AM on Monday, July 13 for follow-up from virtual visit Friday evening- team or Lea can you please adjust the schedule from the 17th to the 13th-once again at the 9 AM slot- I am okay with overbooking it at 840 as well if needed just to get him scheduled for Friday

## 2018-12-31 NOTE — Telephone Encounter (Signed)
Rennis Golden at [phone] and left VM to call the office.

## 2018-12-31 NOTE — Telephone Encounter (Signed)
General/Other - call back  The patient's wife called back trying to get back in touch with Brandy. Please call back

## 2019-01-01 NOTE — Progress Notes (Signed)
Sorry,  A little confused on message you sent in regards to methadone services. Is this correct patient or message?   This charge for Zachary Decker is not anywhere that I can see at this time, however, it looks like this patient Zachary Decker's encounter is still open and we don't get them until closed.    Let me know.  Thanks,  [employer]

## 2019-01-03 ENCOUNTER — Encounter: Payer: Self-pay | Admitting: Family Medicine

## 2019-01-03 ENCOUNTER — Ambulatory Visit (INDEPENDENT_AMBULATORY_CARE_PROVIDER_SITE_OTHER): Payer: Medicare Other | Admitting: Family Medicine

## 2019-01-03 VITALS — BP 118/60 | HR 58 | Temp 98.1°F | Ht 74.0 in | Wt 185.0 lb

## 2019-01-03 DIAGNOSIS — D692 Other nonthrombocytopenic purpura: Secondary | ICD-10-CM | POA: Diagnosis not present

## 2019-01-03 DIAGNOSIS — I10 Essential (primary) hypertension: Secondary | ICD-10-CM

## 2019-01-03 DIAGNOSIS — L03116 Cellulitis of left lower limb: Secondary | ICD-10-CM | POA: Diagnosis not present

## 2019-01-03 NOTE — Patient Instructions (Signed)
There are no preventive care reminders to display for this patient.  Depression screen [facility] 2/9 11/11/2018 10/18/2018 04/13/2018  Decreased Interest 0 0 0  Down, Depressed, Hopeless 0 0 0  PHQ - 2 Score 0 0 0  Altered sleeping 3 0 0  Tired, decreased energy 0 0 0  Change in appetite 0 0 0  Feeling bad or failure about yourself  0 0 0  Trouble concentrating 0 0 0  Moving slowly or fidgety/restless 0 0 0  Suicidal thoughts 0 0 0  PHQ-9 Score 3 0 0  Difficult doing work/chores Somewhat difficult Not difficult at all Not difficult at all

## 2019-01-03 NOTE — Progress Notes (Signed)
Phone [phone]   Subjective:  Virtual visit via Video note. Chief complaint: Chief Complaint  Patient presents with  . Follow-up    This visit type was conducted due to national recommendations for restrictions regarding the COVID-19 Pandemic (e.g. social distancing).  This format is felt to be most appropriate for this patient at this time balancing risks to patient and risks to population by having him in for in person visit.  No physical exam was performed (except for noted visual exam or audio findings with Telehealth visits).    Our team/I connected with [name] at  9:00 AM EDT by a video enabled telemedicine application (doxy.me or caregility through epic) and verified that I am speaking with the correct person using two identifiers.  Location patient: [place] Location provider: [facility], office Persons participating in the virtual visit:  patient  Our team/I discussed the limitations of evaluation and management by telemedicine and the availability of in person appointments. In light of current covid-19 pandemic, patient also understands that we are trying to protect them by minimizing in office contact if at all possible.  The patient expressed consent for telemedicine visit and agreed to proceed. Patient understands insurance will be billed.   ROS-no fever or chills reported.  Leg heaviness is improving.  Overall feels stronger than last week.  Redness on leg is improving.  Swelling in leg particular on the left is improving.  Past Medical History-  Patient Active Problem List   Diagnosis Date Noted  . Balance disorder 04/13/2018    Priority: High  . CAD w/ hx MI s/p CABG 09/07/2012    Priority: High  . Cardiomyopathy, ischemic 03/22/2010    Priority: High  . Low back pain 04/19/2015    Priority: Medium  . CKD (chronic kidney disease), stage III ([place]) 04/20/2014    Priority: Medium  . Anxiety state 04/17/2014    Priority: Medium  . HYPERCHOLESTEROLEMIA  10/31/2008    Priority: Medium  . BPH (benign prostatic hyperplasia) 09/28/2007    Priority: Medium  . Hypothyroidism 03/26/2007    Priority: Medium  . Essential hypertension 03/22/2007    Priority: Medium  . Senile purpura ([place]) 04/13/2018    Priority: Low  . ANEMIA, VITAMIN B12 DEFICIENCY 04/22/2010    Priority: Low  . Actinic keratosis 06/25/2009    Priority: Low  . BASAL CELL CARCINOMA, NOSE 02/05/2009    Priority: Low  . Localized osteoarthrosis, lower leg 12/06/2008    Priority: Low  . GERD 03/22/2007    Priority: Low  . Cellulitis of left foot   . Cellulitis of left leg 10/07/2018  . Insomnia 04/13/2018    Medications- reviewed and updated Current Outpatient Medications  Medication Sig Dispense Refill  . Acetaminophen (TYLENOL EXTRA STRENGTH PO) Take 1-2 tablets by mouth daily as needed (for back pain).    Marland Kitchen aspirin 81 MG tablet Take 81 mg by mouth daily.      . B Complex-C-Folic Acid (FOLBEE PLUS) TABS TAKE 1 TABLET BY MOUTH EVERY DAY 90 tablet 1  . carvedilol (COREG) 3.125 MG tablet TAKE 1 TABLET BY MOUTH 2 TIMES A DAY WITH MEAL (Patient taking differently: Take 3.125 mg by mouth 2 (two) times daily with a meal. ) 180 tablet 3  . cephALEXin (KEFLEX) 250 MG capsule Take 1 capsule (250 mg total) by mouth 4 (four) times daily for 7 days. 28 capsule 0  . diazepam (VALIUM) 2 MG tablet Take 0.5-1 tablets (1-2 mg total) by mouth at  bedtime as needed. For anxiety. Not to take along with trazodone- choose one or the other. 30 tablet 0  . diclofenac sodium (VOLTAREN) 1 % GEL Apply 4 g topically at bedtime as needed (neck pain).    . famotidine (PEPCID) 20 MG tablet Take 20 mg by mouth as needed for heartburn or indigestion.     Marland Kitchen levothyroxine (SYNTHROID) 50 MCG tablet Take 1 tablet (50 mcg total) by mouth daily before breakfast. 90 tablet 1  . lisinopril (PRINIVIL,ZESTRIL) 2.5 MG tablet Take 1 tablet (2.5 mg total) by mouth daily. 90 tablet 3  . Melatonin (CVS MELATONIN) 5 MG  TABS Take 2.5 mg by mouth at bedtime.    . Multiple Vitamins-Minerals (CENTRUM SILVER 50+MEN) TABS Take 1 tablet by mouth daily.    . nitroGLYCERIN (NITROSTAT) 0.4 MG SL tablet Place 1 tablet (0.4 mg total) under the tongue every 5 (five) minutes as needed for chest pain. 20 tablet 1  . OVER THE COUNTER MEDICATION Take 1 capsule by mouth daily. Probiotic    . simvastatin (ZOCOR) 20 MG tablet Take 1 tablet (20 mg total) by mouth at bedtime. 90 tablet 3  . tamsulosin (FLOMAX) 0.4 MG CAPS capsule TAKE 1 CAPSULE (0.4 MG TOTAL) BY MOUTH DAILY. (Patient taking differently: Take 0.4 mg by mouth at bedtime. ) 90 capsule 3  . traZODone (DESYREL) 50 MG tablet TAKE 0.5-1 TABLETS (25-50 MG TOTAL) BY MOUTH AT BEDTIME AS NEEDED FOR SLEEP. 90 tablet 1  . vitamin B-12 (CYANOCOBALAMIN) 500 MCG tablet Take 500 mcg by mouth daily.     No current facility-administered medications for this visit.      Objective:  BP 118/60   Pulse (!) 58   Temp 98.1 F (36.7 C)   Ht 6\' 2"  (1.88 m)   Wt 185 lb (83.9 kg)   BMI 23.75 kg/m  self reported vitals Gen: NAD, resting comfortably Lungs: nonlabored, normal respiratory rate  Skin: Left lower extremity appears much less edematous than picture on Friday- redness has reduced.  Tenderness when patient palpates on the leg has improved.    Assessment and Plan   #Cellulitis S:Last week pt noticed weakness, heaviness, and increased warmth in the  L LE-family primarily noticed the leg changes while patient noticed a feeling of weakness in his legs and heaviness. Sx similar to when he was hsopitalized in April 2020. He was prescribed Cephalexin 250 mg QID on Friday through a telephone visit as patient did not have access to video visit at that time and concern was progression and potential hospitalization.  The Keflex was considered a therapeutic but also diagnostic treatment to see if he would respond in regards to legs feeling stronger.  Fortunately, Sx have improved over the  weekend. He is feeling stronger but still not walking much.  Daughter is with him today-Tracy- and is able to try to help him do some walking-he thinks he will be able to do this and would not have felt confident of that on Friday.  The redness has calm down, swelling has significantly improved in the left leg, tenderness to touch has improved as well.  Patient does have diagnosis of senile purpura- infection on Friday multiple bruises/cuts were noted on the legs-we discussed loss of subcutaneous fat and how this increases risk of bruising and bleeding and particularly if he has open sores may increase risk of cellulitis so encouraged him to try to avoid hitting the leg if at all possible A/P: Diagnostic and therapeutic trial of  Keflex seems to confirm diagnosis of cellulitis today-continue 1 week of treatment. - I told patient if redness, warmth, swelling have not completely resolved by Friday would like to extend another 3 days and he will call if this is needed. - He feels strong enough to try doing some walking at home with his son or daughter and wants to try this instead of physical therapy for now but he will call us if he changes his mind.  Once again likely by Friday or next week. -If patient has new or worsening symptoms he should let us know  #hypertension S: controlled on carvedilol 3.125 mg twice daily, lisinopril 2.5 mg.  Apparently on Saturday blood pressure was much higher but seemed to have improved with antibiotic treatment BP Readings from Last 3 Encounters:  01/03/19 118/60  11/11/18 (!) 112/50  10/28/18 124/66  A/P:  Stable. Continue current medications.  Diastolic blood pressure is low normal and could contribute to feeling of weakness- as long as he continues to improve with antibiotics and trying to do some walking with family-we will hold off on reducing doses of medicine.   Recommended follow up: As needed for this issue-hopeful for full resolution by end of course Future  Appointments  Date Time Provider [place]  04/19/2019  1:00 PM [facility] HEALTH COACH [facility]  04/19/2019  2:20 PM [name], MD [facility]   Lab/Order associations:   ICD-10-CM   1. Cellulitis of left lower extremity  L03.116   2. Senile purpura (HCC) Chronic D69.2   3. Essential hypertension  I10    Return precautions advised.  Garret Reddish, MD

## 2019-01-03 NOTE — Progress Notes (Signed)
Sorry dragon error- method of service was meant to be in place of method of service. Thanks for letting me know- signed encounter noew

## 2019-01-03 NOTE — Telephone Encounter (Signed)
Pt is has been scheduled.

## 2019-01-06 ENCOUNTER — Encounter: Payer: Self-pay | Admitting: Family Medicine

## 2019-01-06 DIAGNOSIS — R600 Localized edema: Secondary | ICD-10-CM

## 2019-01-06 MED ORDER — CEPHALEXIN 250 MG PO CAPS: 250.0000 mg | ORAL_CAPSULE | Freq: Four times a day (QID) | ORAL | 0 refills | Status: AC

## 2019-01-07 ENCOUNTER — Encounter

## 2019-01-07 ENCOUNTER — Ambulatory Visit: Payer: Medicare Other | Admitting: Family Medicine

## 2019-01-24 NOTE — Addendum Note (Signed)
Addended by: [name] on: 01/24/2019 09:35 AM   Modules accepted: Orders

## 2019-01-25 ENCOUNTER — Encounter (HOSPITAL_COMMUNITY): Payer: Medicare Other

## 2019-01-28 ENCOUNTER — Other Ambulatory Visit: Payer: Self-pay

## 2019-01-28 ENCOUNTER — Ambulatory Visit (HOSPITAL_COMMUNITY)
Admission: RE | Admit: 2019-01-28 | Discharge: 2019-01-28 | Disposition: A | Discharge status: Home / Self Care | Payer: Medicare Other | Source: Ambulatory Visit | Attending: Cardiovascular Disease | Admitting: Cardiovascular Disease

## 2019-01-28 DIAGNOSIS — R600 Localized edema: Secondary | ICD-10-CM | POA: Diagnosis present

## 2019-04-11 ENCOUNTER — Other Ambulatory Visit: Payer: Self-pay | Admitting: Family Medicine

## 2019-04-13 ENCOUNTER — Other Ambulatory Visit: Payer: Self-pay | Admitting: Family Medicine

## 2019-04-18 ENCOUNTER — Telehealth: Payer: Self-pay | Admitting: Family Medicine

## 2019-04-18 NOTE — Telephone Encounter (Signed)
We discussed potentially getting him in virtually for 40 today or tomorrow if has transportation issues since we have been able to manage virtually in the past  If he prefers in person- could do 2 20 on thursday

## 2019-04-18 NOTE — Telephone Encounter (Signed)
Spoke with wife put on for Thursday at 220 in office. Not able to do virtual with out daughter. Will call if any change. Reviewed red words with wife and had repeat back to me.

## 2019-04-18 NOTE — Telephone Encounter (Signed)
Called patient has had on going left leg swelling. Seen by you last 12/2018 for evaluation. Most symptoms are in lower left leg. He has some small areas that are red. He denies and drainage anywhere on legs. Feels like their may be some improvement but not much. He has bee wearing compression socks for 8-12 hrs at a time alternating between bedtime and during the day. He does try to keep elevated during the day. He has not noticed any change in walking. Patient does use a walker and continues to. He has not had any sob, confusion, fever or n&V. I have attempted to make an appointment with patient for evaluation. He would rather have a PM appointment. Not sure where you would like to make this. He does not want to come in the next two days unless you feel he need to.

## 2019-04-18 NOTE — Telephone Encounter (Signed)
[facility] [place] RECORD [facility]urse Patient Name: Zachary Decker Gender: Male DOB: [birth year] Age: 85 Y 9 M 26 D Return Phone Number: [phone] (Primary) Address: City/State/Zip: [place] [address] Client [facility] [place] Client Site [facility] [place] Night Physician Zachary Decker- MD Contact Type Call Who Is Calling Patient / Member / Family / Caregiver Call Type Triage / Clinical Caller Name Zachary Decker Relationship To Patient Daughter Return Phone Number [phone] (Primary) Chief Complaint Leg Pain Reason for Call Symptomatic / Request for [place] states she is calling for her dad. He has had some cellulitis in his leg several times. She thinks its coming back. He is becoming non mobile again. He currently has been very sleepy. She states she is on her way there but doesn't know what it physically looks like. He has been having swolling because he has been using his compression socks. Translation No Nurse Assessment Nurse: Zachary Negus, RN, Zachary Decker Date/Time Zachary Decker Time): 04/16/2019 1:27:36 PM Confirm and document reason for call. If symptomatic, describe symptoms. ---Caller states she is calling for her dad. He has had some cellulitis in his leg several times. She thinks its coming back. He is becoming non mobile again. He currently has been very sleepy. She states she is on her way there but doesn't know what it physically looks like. He has been having swelling because he has been using his compression sock. He is feeling week and groggy. He has been sleeping in the chair because he can can get more comfortable. The swelling in his legs and ankles is less but one spot is redder than it was but it is not warm to touch, his toes are red and is feet are not hurting. He is having a hard time walking and getting comfortable.  He feels like the [name] is not helping any more. Has the patient had close contact with a person known or suspected to have the novel coronavirus illness OR traveled / lives in area with major community spread (including international travel) in the last 14 days from the onset of symptoms? * If Asymptomatic, screen for exposure and travel within the last 14 days. ---No Does the patient have any new or worsening symptoms? ---Yes Will a triage be completed? ---Yes Related visit to physician within the last 2 weeks? ---No PLEASE NOTE: All timestamps contained within this report are represented as [place] Standard Time. CONFIDENTIALTY NOTICE: This fax transmission is intended only for the addressee. It contains information that is legally privileged, confidential or otherwise protected from use or disclosure. If you are not the intended recipient, you are strictly prohibited from reviewing, disclosing, copying using or disseminating any of this information or taking any action in reliance on or regarding this information. If you have received this fax in error, please notify us immediately by telephone so that we can arrange for its return to [place]. Phone: [phone], Toll-Free: [phone], Fax: [phone] Page: 2 of 2 Call Id: [id number] Nurse Assessment Does the PT have any chronic conditions? (i.e. diabetes, asthma, this includes High risk factors for pregnancy, etc.) ---Yes List chronic conditions. ---hypertension Is this a behavioral health or substance abuse call? ---No Guidelines Guideline Title Affirmed Question Affirmed Notes Nurse Date/Time Zachary Decker Time) Leg Swelling and Edema [1] Thigh or calf pain AND [2] only 1 side AND [3] present > 1 hour Oren Bracket 04/16/2019 1:34:14 PM Disp. Time (  [place] Time) Disposition Final User 04/16/2019 12:57:33 PM Attempt made - message left Oren Bracket 04/16/2019 1:42:42 PM See HCP within 4 Hours (or PCP triage) Yes  Zachary Negus, RN, [place] Disagree/Comply Disagree Caller Understands Yes PreDisposition Call Doctor Care Advice Given Per Guideline SEE HCP WITHIN 4 HOURS (OR PCP TRIAGE): CARE ADVICE given per Leg Swelling and Edema (Adult) guideline. CALL EMS IF: Chest pain or shortness of breath occurs. Referrals GO TO FACILITY REFUSED  PER [facility]

## 2019-04-19 ENCOUNTER — Ambulatory Visit: Payer: Medicare Other

## 2019-04-20 ENCOUNTER — Other Ambulatory Visit: Payer: Self-pay

## 2019-04-20 ENCOUNTER — Emergency Department (HOSPITAL_COMMUNITY): Payer: Medicare Other

## 2019-04-20 ENCOUNTER — Encounter (HOSPITAL_COMMUNITY): Payer: Self-pay | Admitting: Family Medicine

## 2019-04-20 ENCOUNTER — Other Ambulatory Visit: Payer: Self-pay | Admitting: Family Medicine

## 2019-04-20 ENCOUNTER — Inpatient Hospital Stay (HOSPITAL_COMMUNITY)
Admission: EM | Admit: 2019-04-20 | Discharge: 2019-04-30 | DRG: 871 | Disposition: A | Discharge status: Home Health | Payer: Medicare Other | Attending: Internal Medicine | Admitting: Internal Medicine

## 2019-04-20 ENCOUNTER — Inpatient Hospital Stay (HOSPITAL_COMMUNITY): Payer: Medicare Other

## 2019-04-20 DIAGNOSIS — E860 Dehydration: Secondary | ICD-10-CM

## 2019-04-20 DIAGNOSIS — L03115 Cellulitis of right lower limb: Secondary | ICD-10-CM | POA: Diagnosis present

## 2019-04-20 DIAGNOSIS — Z7982 Long term (current) use of aspirin: Secondary | ICD-10-CM | POA: Diagnosis not present

## 2019-04-20 DIAGNOSIS — B9561 Methicillin susceptible Staphylococcus aureus infection as the cause of diseases classified elsewhere: Secondary | ICD-10-CM | POA: Diagnosis not present

## 2019-04-20 DIAGNOSIS — Z831 Family history of other infectious and parasitic diseases: Secondary | ICD-10-CM

## 2019-04-20 DIAGNOSIS — I129 Hypertensive chronic kidney disease with stage 1 through stage 4 chronic kidney disease, or unspecified chronic kidney disease: Secondary | ICD-10-CM | POA: Diagnosis present

## 2019-04-20 DIAGNOSIS — K264 Chronic or unspecified duodenal ulcer with hemorrhage: Secondary | ICD-10-CM | POA: Diagnosis present

## 2019-04-20 DIAGNOSIS — I2510 Atherosclerotic heart disease of native coronary artery without angina pectoris: Secondary | ICD-10-CM | POA: Diagnosis present

## 2019-04-20 DIAGNOSIS — M79604 Pain in right leg: Secondary | ICD-10-CM | POA: Diagnosis present

## 2019-04-20 DIAGNOSIS — Z886 Allergy status to analgesic agent status: Secondary | ICD-10-CM

## 2019-04-20 DIAGNOSIS — E7800 Pure hypercholesterolemia, unspecified: Secondary | ICD-10-CM

## 2019-04-20 DIAGNOSIS — D62 Acute posthemorrhagic anemia: Secondary | ICD-10-CM | POA: Diagnosis not present

## 2019-04-20 DIAGNOSIS — Z880 Allergy status to penicillin: Secondary | ICD-10-CM | POA: Diagnosis not present

## 2019-04-20 DIAGNOSIS — Z8249 Family history of ischemic heart disease and other diseases of the circulatory system: Secondary | ICD-10-CM | POA: Diagnosis not present

## 2019-04-20 DIAGNOSIS — E034 Atrophy of thyroid (acquired): Secondary | ICD-10-CM | POA: Diagnosis not present

## 2019-04-20 DIAGNOSIS — E785 Hyperlipidemia, unspecified: Secondary | ICD-10-CM | POA: Diagnosis present

## 2019-04-20 DIAGNOSIS — Z85828 Personal history of other malignant neoplasm of skin: Secondary | ICD-10-CM

## 2019-04-20 DIAGNOSIS — N179 Acute kidney failure, unspecified: Secondary | ICD-10-CM | POA: Diagnosis not present

## 2019-04-20 DIAGNOSIS — K219 Gastro-esophageal reflux disease without esophagitis: Secondary | ICD-10-CM | POA: Diagnosis present

## 2019-04-20 DIAGNOSIS — E039 Hypothyroidism, unspecified: Secondary | ICD-10-CM | POA: Diagnosis present

## 2019-04-20 DIAGNOSIS — A4101 Sepsis due to Methicillin susceptible Staphylococcus aureus: Secondary | ICD-10-CM | POA: Diagnosis present

## 2019-04-20 DIAGNOSIS — Z8672 Personal history of thrombophlebitis: Secondary | ICD-10-CM | POA: Diagnosis not present

## 2019-04-20 DIAGNOSIS — R531 Weakness: Secondary | ICD-10-CM | POA: Diagnosis not present

## 2019-04-20 DIAGNOSIS — Z79890 Hormone replacement therapy (postmenopausal): Secondary | ICD-10-CM

## 2019-04-20 DIAGNOSIS — K319 Disease of stomach and duodenum, unspecified: Secondary | ICD-10-CM | POA: Diagnosis present

## 2019-04-20 DIAGNOSIS — I340 Nonrheumatic mitral (valve) insufficiency: Secondary | ICD-10-CM | POA: Diagnosis not present

## 2019-04-20 DIAGNOSIS — Z872 Personal history of diseases of the skin and subcutaneous tissue: Secondary | ICD-10-CM | POA: Diagnosis not present

## 2019-04-20 DIAGNOSIS — Z23 Encounter for immunization: Secondary | ICD-10-CM

## 2019-04-20 DIAGNOSIS — I252 Old myocardial infarction: Secondary | ICD-10-CM

## 2019-04-20 DIAGNOSIS — M86171 Other acute osteomyelitis, right ankle and foot: Secondary | ICD-10-CM | POA: Diagnosis present

## 2019-04-20 DIAGNOSIS — K922 Gastrointestinal hemorrhage, unspecified: Secondary | ICD-10-CM

## 2019-04-20 DIAGNOSIS — Z888 Allergy status to other drugs, medicaments and biological substances status: Secondary | ICD-10-CM | POA: Diagnosis not present

## 2019-04-20 DIAGNOSIS — R7881 Bacteremia: Secondary | ICD-10-CM

## 2019-04-20 DIAGNOSIS — N183 Chronic kidney disease, stage 3 unspecified: Secondary | ICD-10-CM | POA: Diagnosis present

## 2019-04-20 DIAGNOSIS — Z20828 Contact with and (suspected) exposure to other viral communicable diseases: Secondary | ICD-10-CM | POA: Diagnosis present

## 2019-04-20 DIAGNOSIS — D500 Iron deficiency anemia secondary to blood loss (chronic): Secondary | ICD-10-CM | POA: Diagnosis not present

## 2019-04-20 DIAGNOSIS — Z951 Presence of aortocoronary bypass graft: Secondary | ICD-10-CM

## 2019-04-20 DIAGNOSIS — Z96652 Presence of left artificial knee joint: Secondary | ICD-10-CM | POA: Diagnosis not present

## 2019-04-20 DIAGNOSIS — Z800 Family history of malignant neoplasm of digestive organs: Secondary | ICD-10-CM

## 2019-04-20 DIAGNOSIS — S99921A Unspecified injury of right foot, initial encounter: Secondary | ICD-10-CM

## 2019-04-20 DIAGNOSIS — K269 Duodenal ulcer, unspecified as acute or chronic, without hemorrhage or perforation: Secondary | ICD-10-CM | POA: Diagnosis not present

## 2019-04-20 DIAGNOSIS — L03818 Cellulitis of other sites: Principal | ICD-10-CM

## 2019-04-20 DIAGNOSIS — I361 Nonrheumatic tricuspid (valve) insufficiency: Secondary | ICD-10-CM | POA: Diagnosis not present

## 2019-04-20 DIAGNOSIS — I700 Atherosclerosis of aorta: Secondary | ICD-10-CM | POA: Diagnosis not present

## 2019-04-20 DIAGNOSIS — I739 Peripheral vascular disease, unspecified: Secondary | ICD-10-CM | POA: Diagnosis present

## 2019-04-20 DIAGNOSIS — N400 Enlarged prostate without lower urinary tract symptoms: Secondary | ICD-10-CM | POA: Diagnosis present

## 2019-04-20 LAB — COMPREHENSIVE METABOLIC PANEL
ALT: 20 U/L (ref 0–44)
AST: 27 U/L (ref 15–41)
Albumin: 3.4 g/dL — ABNORMAL LOW (ref 3.5–5.0)
Alkaline Phosphatase: 45 U/L (ref 38–126)
Anion gap: 10 (ref 5–15)
BUN: 32 mg/dL — ABNORMAL HIGH (ref 8–23)
CO2: 20 mmol/L — ABNORMAL LOW (ref 22–32)
Calcium: 8.8 mg/dL — ABNORMAL LOW (ref 8.9–10.3)
Chloride: 105 mmol/L (ref 98–111)
Creatinine, Ser: 1.47 mg/dL — ABNORMAL HIGH (ref 0.61–1.24)
GFR calc Af Amer: 50 mL/min — ABNORMAL LOW (ref >60)
GFR calc non Af Amer: 43 mL/min — ABNORMAL LOW (ref >60)
Glucose, Bld: 128 mg/dL — ABNORMAL HIGH (ref 70–99)
Potassium: 4.4 mmol/L (ref 3.5–5.1)
Sodium: 135 mmol/L (ref 135–145)
Total Bilirubin: 1 mg/dL (ref 0.3–1.2)
Total Protein: 6.5 g/dL (ref 6.5–8.1)

## 2019-04-20 LAB — LACTIC ACID, PLASMA: Lactic Acid, Venous: 1 mmol/L (ref 0.5–1.9)

## 2019-04-20 LAB — CBC WITH DIFFERENTIAL/PLATELET
Abs Immature Granulocytes: 0 10*3/uL (ref 0.00–0.07)
Basophils Absolute: 0 10*3/uL (ref 0.0–0.1)
Basophils Relative: 0 %
Eosinophils Absolute: 0 10*3/uL (ref 0.0–0.5)
Eosinophils Relative: 0 %
HCT: 34.8 % — ABNORMAL LOW (ref 39.0–52.0)
Hemoglobin: 11.6 g/dL — ABNORMAL LOW (ref 13.0–17.0)
Lymphocytes Relative: 5 %
Lymphs Abs: 1.6 10*3/uL (ref 0.7–4.0)
MCH: 34.2 pg — ABNORMAL HIGH (ref 26.0–34.0)
MCHC: 33.3 g/dL (ref 30.0–36.0)
MCV: 102.7 fL — ABNORMAL HIGH (ref 80.0–100.0)
Monocytes Absolute: 1.3 10*3/uL — ABNORMAL HIGH (ref 0.1–1.0)
Monocytes Relative: 4 %
Neutro Abs: 29 10*3/uL — ABNORMAL HIGH (ref 1.7–7.7)
Neutrophils Relative %: 91 %
Platelets: 256 10*3/uL (ref 150–400)
RBC: 3.39 MIL/uL — ABNORMAL LOW (ref 4.22–5.81)
RDW: 13.2 % (ref 11.5–15.5)
WBC: 31.9 10*3/uL — ABNORMAL HIGH (ref 4.0–10.5)
nRBC: 0 % (ref 0.0–0.2)
nRBC: 0 /100 WBC

## 2019-04-20 LAB — SARS CORONAVIRUS 2 (TAT 6-24 HRS): SARS Coronavirus 2: NEGATIVE

## 2019-04-20 MED ORDER — POLYETHYLENE GLYCOL 3350 17 G PO PACK
17.0000 g | PACK | Freq: Every day | ORAL | Status: DC | PRN
  Administered 2019-04-22: 17 g via ORAL
  Filled 2019-04-20: qty 1

## 2019-04-20 MED ORDER — TRAZODONE HCL 50 MG PO TABS
25.0000 mg | ORAL_TABLET | Freq: Every evening | ORAL | Status: DC | PRN
  Administered 2019-04-20 – 2019-04-25 (×2): 50 mg via ORAL
  Filled 2019-04-20 (×2): qty 1

## 2019-04-20 MED ORDER — ASPIRIN 81 MG PO CHEW
81.0000 mg | CHEWABLE_TABLET | Freq: Every day | ORAL | Status: DC
  Administered 2019-04-20 – 2019-04-27 (×8): 81 mg via ORAL
  Filled 2019-04-20 (×8): qty 1

## 2019-04-20 MED ORDER — LEVOTHYROXINE SODIUM 50 MCG PO TABS
50.0000 ug | ORAL_TABLET | Freq: Every day | ORAL | Status: DC
  Administered 2019-04-21 – 2019-04-30 (×10): 50 ug via ORAL
  Filled 2019-04-20 (×10): qty 1

## 2019-04-20 MED ORDER — SODIUM CHLORIDE 0.9 % IV SOLN: INTRAVENOUS | Status: DC

## 2019-04-20 MED ORDER — VANCOMYCIN HCL IN DEXTROSE 1-5 GM/200ML-% IV SOLN
1000.0000 mg | Freq: Once | INTRAVENOUS | Status: AC
  Administered 2019-04-20: 18:00:00 1000 mg via INTRAVENOUS
  Filled 2019-04-20: qty 200

## 2019-04-20 MED ORDER — ACETAMINOPHEN 325 MG PO TABS
650.0000 mg | ORAL_TABLET | Freq: Four times a day (QID) | ORAL | Status: DC | PRN
  Administered 2019-04-24 – 2019-04-26 (×3): 650 mg via ORAL
  Filled 2019-04-20 (×4): qty 2

## 2019-04-20 MED ORDER — ONDANSETRON HCL 4 MG/2ML IJ SOLN: 4.0000 mg | Freq: Four times a day (QID) | INTRAMUSCULAR | Status: DC | PRN

## 2019-04-20 MED ORDER — ONDANSETRON HCL 4 MG PO TABS: 4.0000 mg | ORAL_TABLET | Freq: Four times a day (QID) | ORAL | Status: DC | PRN

## 2019-04-20 MED ORDER — TAMSULOSIN HCL 0.4 MG PO CAPS
0.4000 mg | ORAL_CAPSULE | Freq: Every day | ORAL | Status: DC
  Administered 2019-04-20 – 2019-04-29 (×10): 0.4 mg via ORAL
  Filled 2019-04-20 (×10): qty 1

## 2019-04-20 MED ORDER — SODIUM CHLORIDE 0.9 % IV SOLN
INTRAVENOUS | Status: DC
  Administered 2019-04-20: 18:00:00 via INTRAVENOUS

## 2019-04-20 MED ORDER — SIMVASTATIN 20 MG PO TABS
20.0000 mg | ORAL_TABLET | Freq: Every day | ORAL | Status: DC
  Administered 2019-04-20 – 2019-04-29 (×10): 20 mg via ORAL
  Filled 2019-04-20 (×10): qty 1

## 2019-04-20 MED ORDER — ENOXAPARIN SODIUM 40 MG/0.4ML ~~LOC~~ SOLN
40.0000 mg | SUBCUTANEOUS | Status: DC
  Administered 2019-04-20 – 2019-04-26 (×7): 40 mg via SUBCUTANEOUS
  Filled 2019-04-20 (×7): qty 0.4

## 2019-04-20 MED ORDER — SODIUM CHLORIDE 0.9 % IV SOLN
2.0000 g | INTRAVENOUS | Status: DC
  Administered 2019-04-20 – 2019-04-21 (×2): 2 g via INTRAVENOUS
  Filled 2019-04-20 (×2): qty 20

## 2019-04-20 MED ORDER — FAMOTIDINE 20 MG PO TABS: 20.0000 mg | ORAL_TABLET | ORAL | Status: DC | PRN

## 2019-04-20 MED ORDER — CARVEDILOL 3.125 MG PO TABS
3.1250 mg | ORAL_TABLET | Freq: Two times a day (BID) | ORAL | Status: DC
  Administered 2019-04-21 – 2019-04-29 (×15): 3.125 mg via ORAL
  Filled 2019-04-20 (×16): qty 1

## 2019-04-20 MED ORDER — LISINOPRIL 5 MG PO TABS
2.5000 mg | ORAL_TABLET | Freq: Every day | ORAL | Status: DC
  Administered 2019-04-20: 2.5 mg via ORAL
  Filled 2019-04-20: qty 1

## 2019-04-20 MED ORDER — SODIUM CHLORIDE 0.9% FLUSH
3.0000 mL | Freq: Two times a day (BID) | INTRAVENOUS | Status: DC
  Administered 2019-04-20 – 2019-04-30 (×13): 3 mL via INTRAVENOUS

## 2019-04-20 NOTE — ED Provider Notes (Signed)
[facility] EMERGENCY DEPARTMENT Provider Note   CSN: [id number] Arrival date & time: 04/20/19  1217     History   Chief Complaint Chief Complaint  Patient presents with  . Leg Pain    HPI Zachary Decker is a 85 y.o. male.     85 year old male presents with right lower leg pain and swelling.  History of cellulitis and this is similar.  Denies any fever or chills.  No chest pain or shortness of breath.  Denies any pain in upper portion of his leg.  Patient notes increased trouble ambulating due to his pain.  Denies any history of PE.  No treatment use prior to arrival.     Past Medical History:  Diagnosis Date  . Acute myocardial infarction of inferoposterior wall, subsequent episode of care ([place])   . CAD (coronary artery disease)   . Encounter for long-term (current) use of other medications   . Family history of malignant neoplasm of gastrointestinal tract   . GERD (gastroesophageal reflux disease)   . Hypercholesterolemia   . Hypertension   . Hypertrophy of prostate with urinary obstruction and other lower urinary tract symptoms (LUTS)   . Personal history of thrombophlebitis   . PVD (peripheral vascular disease) ([place])   . Respiratory failure ([place])   . Unspecified adverse effect of unspecified drug, medicinal and biological substance   . Unspecified hypothyroidism     Patient Active Problem List   Diagnosis Date Noted  . Cellulitis of left foot   . Cellulitis of left leg 10/07/2018  . Balance disorder 04/13/2018  . Senile purpura ([place]) 04/13/2018  . Insomnia 04/13/2018  . Low back pain 04/19/2015  . CKD (chronic kidney disease), stage III 04/20/2014  . Anxiety state 04/17/2014  . CAD w/ hx MI s/p CABG 09/07/2012  . ANEMIA, VITAMIN B12 DEFICIENCY 04/22/2010  . Cardiomyopathy, ischemic 03/22/2010  . Actinic keratosis 06/25/2009  . BASAL CELL CARCINOMA, NOSE 02/05/2009  . Localized osteoarthrosis, lower leg 12/06/2008  .  HYPERCHOLESTEROLEMIA 10/31/2008  . BPH (benign prostatic hyperplasia) 09/28/2007  . Hypothyroidism 03/26/2007  . Essential hypertension 03/22/2007  . GERD 03/22/2007    Past Surgical History:  Procedure Laterality Date  . CHOLECYSTECTOMY  02/26/2012   Procedure: LAPAROSCOPIC CHOLECYSTECTOMY WITH INTRAOPERATIVE CHOLANGIOGRAM;  Surgeon: Madilyn Hook, DO;  Location: [facility];  Service: General;  Laterality: N/A;  . CORONARY ARTERY BYPASS GRAFT     x 5 on August 29, 2008  . KNEE SURGERY    . tachecostomy placement     after heart attack  . TONSILLECTOMY          Home Medications    Prior to Admission medications   Medication Sig Start Date End Date Taking? Authorizing Provider  Acetaminophen (TYLENOL EXTRA STRENGTH PO) Take 1-2 tablets by mouth daily as needed (for back pain).    [provider]  aspirin 81 MG tablet Take 81 mg by mouth daily.      [provider]  B Complex-C-Folic Acid (FOLBEE PLUS) TABS TAKE 1 TABLET BY MOUTH EVERY DAY 11/03/18   [name], MD  carvedilol (COREG) 3.125 MG tablet TAKE 1 TABLET BY MOUTH 2 TIMES A DAY WITH MEAL Patient taking differently: Take 3.125 mg by mouth 2 (two) times daily with a meal.  04/13/18   [name], MD  diazepam (VALIUM) 2 MG tablet Take 0.5-1 tablets (1-2 mg total) by mouth at bedtime as needed. For anxiety. Not to take along with trazodone-  choose one or the other. 11/11/18   [name], MD  diclofenac sodium (VOLTAREN) 1 % GEL APPLY 2 GRAMS TO AFFECTED AREA 4 TIMES A DAY 04/11/19   [name], MD  famotidine (PEPCID) 20 MG tablet Take 20 mg by mouth as needed for heartburn or indigestion.     [provider]  levothyroxine (SYNTHROID) 50 MCG tablet Take 1 tablet (50 mcg total) by mouth daily before breakfast. 10/21/18   [name], MD  lisinopril (PRINIVIL,ZESTRIL) 2.5 MG tablet Take 1 tablet (2.5 mg total) by mouth daily. 04/13/18   [name], MD  Melatonin ([facility]  MELATONIN) 5 MG TABS Take 2.5 mg by mouth at bedtime.    [provider]  Multiple Vitamins-Minerals (CENTRUM SILVER 50+MEN) TABS Take 1 tablet by mouth daily.    [provider]  nitroGLYCERIN (NITROSTAT) 0.4 MG SL tablet Place 1 tablet (0.4 mg total) under the tongue every 5 (five) minutes as needed for chest pain. 04/13/18   [name], MD  OVER THE COUNTER MEDICATION Take 1 capsule by mouth daily. Probiotic    [provider]  simvastatin (ZOCOR) 20 MG tablet TAKE 1 TABLET BY MOUTH EVERYDAY AT BEDTIME 04/13/19   [name], MD  tamsulosin (FLOMAX) 0.4 MG CAPS capsule TAKE 1 CAPSULE (0.4 MG TOTAL) BY MOUTH DAILY. Patient taking differently: Take 0.4 mg by mouth at bedtime.  04/13/18   [name], MD  traZODone (DESYREL) 50 MG tablet TAKE 0.5-1 TABLETS (25-50 MG TOTAL) BY MOUTH AT BEDTIME AS NEEDED FOR SLEEP. 12/03/18   [name], MD  vitamin B-12 (CYANOCOBALAMIN) 500 MCG tablet Take 500 mcg by mouth daily.    [provider]    Family History Family History  Problem Relation Age of Onset  . Heart disease Mother   . Tuberculosis Father     Social History Social History   Tobacco Use  . Smoking status: Never Smoker  . Smokeless tobacco: Never Used  Substance Use Topics  . Alcohol use: No  . Drug use: No     Allergies   Losartan potassium, Naproxen, and Penicillins   Review of Systems Review of Systems  All other systems reviewed and are negative.    Physical Exam Updated Vital Signs BP 137/66   Pulse 61   Temp 98 F (36.7 C) (Oral)   Resp 16   Ht 1.88 m (6\' 2" )   Wt 83.9 kg   SpO2 96%   BMI 23.75 kg/m   Physical Exam Vitals signs and nursing note reviewed.  Constitutional:      General: He is not in acute distress.    Appearance: Normal appearance. He is well-developed. He is not toxic-appearing.  HENT:     Head: Normocephalic and atraumatic.  Eyes:     General: Lids are normal.      Conjunctiva/sclera: Conjunctivae normal.     Pupils: Pupils are equal, round, and reactive to light.  Neck:     Musculoskeletal: Normal range of motion and neck supple.     Thyroid: No thyroid mass.     Trachea: No tracheal deviation.  Cardiovascular:     Rate and Rhythm: Normal rate and regular rhythm.     Heart sounds: Normal heart sounds. No murmur. No gallop.   Pulmonary:     Effort: Pulmonary effort is normal. No respiratory distress.     Breath sounds: Normal breath sounds. No stridor. No decreased breath sounds, wheezing, rhonchi  or rales.  Abdominal:     General: Bowel sounds are normal. There is no distension.     Palpations: Abdomen is soft.     Tenderness: There is no abdominal tenderness. There is no rebound.  Musculoskeletal: Normal range of motion.        General: No tenderness.       Legs:  Skin:    General: Skin is warm and dry.     Findings: No abrasion or rash.  Neurological:     Mental Status: He is alert and oriented to person, place, and time.     GCS: GCS eye subscore is 4. GCS verbal subscore is 5. GCS motor subscore is 6.     Cranial Nerves: No cranial nerve deficit.     Sensory: No sensory deficit.  Psychiatric:        Speech: Speech normal.        Behavior: Behavior normal.      ED Treatments / Results  Labs (all labs ordered are listed, but only abnormal results are displayed) Labs Reviewed  COMPREHENSIVE METABOLIC PANEL - Abnormal; Notable for the following components:      Result Value   CO2 20 (*)    Glucose, Bld 128 (*)    BUN 32 (*)    Creatinine, Ser 1.47 (*)    Calcium 8.8 (*)    Albumin 3.4 (*)    GFR calc non Af Amer 43 (*)    GFR calc Af Amer 50 (*)    All other components within normal limits  CBC WITH DIFFERENTIAL/PLATELET - Abnormal; Notable for the following components:   WBC 31.9 (*)    RBC 3.39 (*)    Hemoglobin 11.6 (*)    HCT 34.8 (*)    MCV 102.7 (*)    MCH 34.2 (*)    Neutro Abs 29.0 (*)    Monocytes Absolute 1.3  (*)    All other components within normal limits  SARS CORONAVIRUS 2 (TAT 6-24 HRS)    EKG None  Radiology Ct Head Wo Contrast  Result Date: 04/20/2019 CLINICAL DATA:  Difficulty with ambulation and balance. Generalized weakness. Probable right lower extremity cellulitis. No reported injury. EXAM: CT HEAD WITHOUT CONTRAST TECHNIQUE: Contiguous axial images were obtained from the base of the skull through the vertex without intravenous contrast. COMPARISON:  10/07/2018 head CT. FINDINGS: Brain: No evidence of parenchymal hemorrhage or extra-axial fluid collection. No mass lesion, mass effect, or midline shift. No CT evidence of acute infarction. Nonspecific mild subcortical and periventricular white matter hypodensity, most in keeping with chronic small vessel ischemic change. Generalized cerebral volume loss. There is symmetric dilatation of the lateral ventricles bilaterally, similar to prior. Vascular: No acute abnormality. Skull: No evidence of calvarial fracture. Sinuses/Orbits: The visualized paranasal sinuses are essentially clear. Other:  The mastoid air cells are unopacified. IMPRESSION: 1.  No evidence of acute intracranial abnormality. 2. Symmetric dilatation of the lateral ventricles, similar to prior head CT. While potentially due to central cerebral volume loss, the lateral ventricular dilatation appears somewhat out of proportion to the generalized cerebral volume loss, and normal pressure hydrocephalus cannot be strictly excluded. Neurology consultation could be considered, as clinically warranted. 3. Mild chronic small vessel ischemic changes in the cerebral white matter. Electronically Signed   By: Ilona Sorrel M.D.   On: 04/20/2019 13:57    Procedures Procedures (including critical care time)  Medications Ordered in ED Medications  vancomycin (VANCOCIN) IVPB 1000 mg/200 mL premix (has no  administration in time range)  0.9 %  sodium chloride infusion (has no administration in  time range)     Initial Impression / Assessment and Plan / ED Course  I have reviewed the triage vital signs and the nursing notes.  Pertinent labs & imaging results that were available during my care of the patient were reviewed by me and considered in my medical decision making (see chart for details).        Patient with significant leukocytosis along with clinical findings consistent with cellulitis.  Will add Doppler to his lower extremity.  Blood cultures and lactate also ordered.  Will start on IV vancomycin and admit to the medicine service  Final Clinical Impressions(s) / ED Diagnoses   Final diagnoses:  None    ED Discharge Orders    None       Lacretia Leigh, MD 04/20/19 1709

## 2019-04-20 NOTE — ED Triage Notes (Addendum)
Per [facility], pt complains of left leg pain with right lower leg redness with hx of cellulitis. Having difficulty ambulating due to this. VS 110/54, 63, 97% on RA. Axox4. Drools at baseline. Also states that he has been off balance for a couple of months. No neuro deficits detected by this RN.

## 2019-04-20 NOTE — H&P (Signed)
History and Physical  COUNCIL PENCEK [id number] DOB: [birth year] DOA: 04/20/2019  PCP: [name], MD   Chief Complaint: Difficulty walking  HPI:  85 year old man PMH including MI, hypothyroidism, left lower extremity cellulitis with sepsis in April of this year, presented with difficulty walking over the last 48 hours.  Found to have right lower extremity cellulitis and significant leukocytosis.  Admitted for treatment of cellulitis.  Back in April the patient had left lower extremity cellulitis and sepsis at that time.  He did not have pain at that time, his only symptom was difficulty walking.  Over the last 48 hours he has noticed increased difficulty walking.  He said no pain in his legs.  Low-grade fever only at home.  No nausea or vomiting.  No specific aggravating or alleviating factors.  No specific associated symptoms other than noted.  Chart review: . Hospitalized April 2020 for sepsis secondary to left lower extremity cellulitis.  Seen in clinic April around July 2020 for left lower extremity cellulitis  ED Course: Presented with a right leg pain and swelling.  Known to be afebrile and hemodynamically stable.  Noted to have diffuse erythema and edema to the right lower extremity with lymphangitis.  Treated with vancomycin   Review of Systems:  Positive for low-grade fever at home, blurred vision for a week although he can still see okay, negative sore throat, new muscle aches, positive for chronic shoulder and hip and back pain, positive for rash on right thigh for about a week, negative for chest pain or shortness of breath, nausea or vomiting  PMH . Left lower extremity cellulitis with sepsis in April 2020  . BPH . Hypothyroidism . CAD, MI . Remainder reviewed in Epic  [place] . CABG . Cholecystectomy . Remainder reviewed in Epic  Family history includes: . Mother with heart disease, father with tuberculosis . Remainder reviewed in [place] History .  Does not smoke or drink  Allergies . Penicillin but tolerated ceftriaxone in April 2020 . Remainder reviewed in Epic  Meds include: . Carvedilol 3.125 twice daily, aspirin 81 mg daily, levothyroxine 50 mcg daily, lisinopril 2.5 mg daily, Zocor 20 mg daily, Flomax 0.4 mg daily, trazodone 25-50 mg at night for sleep as needed . Remainder reviewed in [place]:  . 98.0, 16, 61, 137/66, 96% on room air  Constitutional:   . Appears calm and comfortable, frail Eyes:  . pupils and irises appear normal . Normal lids ENMT:  . grossly normal hearing  . Lips appear normal Neck:  . No masses noted . no thyromegaly Respiratory:  . CTA bilaterally, no w/r/r.  . Respiratory effort normal. Cardiovascular:  . RRR, no m/r/g . Dorsalis pedis pulses 2+ bilaterally . 1+ right LE extremity edema.  No significant left lower extremity edema. Abdomen:  . Soft, nontender, nondistended.  No masses appreciated.  No hernias noted. Musculoskeletal:  .  Marland Kitchen Tone bilateral lower extremities grossly normal.  Diminished strength bilaterally. Skin:  . Macular rash over right thigh, nontender.  There is significant erythema of the lower leg on the right that involves only the proximal ankle and foot. . palpation of skin: no induration or nodules Neurologic:  . Grossly nonfocal Psychiatric:  . Mental status o Mood, affect appropriate . judgment and insight appear intact    I have personally reviewed following labs and imaging studies  Labs:  . Blood cultures pending . SARS-CoV-2 pending . Lactic acid within normal limits .  Creatinine 1.47, LFTs unremarkable . WBC 31.9, hemoglobin 11.6, platelets 256  Imaging studies:   CT head done for difficulty with balance.  No acute intracranial abnormality.  Symmetric dilatation of lateral ventricles.  Could be central cerebral volume loss, cannot exclude normal pressure hydrocephalus.  Significant Hospital Events   . 10/28 admitted  for right lower extremity cellulitis   Consults:  .    Procedures:  .   Significant Diagnostic Tests:   CT head symmetric dilatation of lateral ventricles.  Could be central cerebral volume loss, cannot exclude normal pressure hydrocephalus.   Micro Data:  . 10/28 blood cultures >   Antimicrobials:  . Ceftriaxone 10/28 >  ASSESSMENT/PLAN  Right lower extremity cellulitis with associated generalized weakness, significant erythema, marked leukocytosis complicated by generally frail condition --Lactic acid within normal limits.  Normotensive.  Fortunately no signs of sepsis at this time.   --Admit, treat with empiric antibiotics, follow-up culture data --CBC in a.m.  Dehydration --IV fluids.  Check BMP in a.m.  Generalized weakness, gait imbalance at home.   --No focal deficits noted.  Physical therapy evaluation  Symmetric dilatation of lateral ventricles.  Could be central cerebral volume loss, cannot exclude normal pressure hydrocephalus. --Given gait difficulties, follow-up PT evaluation consider outpatient neurology evaluation  Hypothyroidism --Continue Synthroid  Hyperlipidemia --Continue statin  No Covid signs or symptoms.  Stays at home mostly, daughter does all shopping.  Has only been out to see the doctor.  Asymptomatic screening call.  Seen with face shield, surgical mask and gloves.   DVT prophylaxis: enoxaparin Code Status: Full per patient Family Communication:  Daughter at bedside Consults called: none    Time spent: 60 minutes  Murray Hodgkins, MD  [facility] Direct contact: see [email or web address]  7PM-7AM contact night coverage as below   1. Check the care team in [facility] and look for a) attending/consulting [facility] provider listed and b) the [facility] team listed 2. Log into [email or web address] and use [place]'s universal password to access. If you do not have the password, please contact the hospital operator. 3. Locate the [facility] provider you are looking  for under [facility] and page to a number that you can be directly reached. 4. If you still have difficulty reaching the provider, please page the [facility] (Director on Call) for the Hospitalists listed on amion for assistance.  Severity of Illness: The appropriate patient status for this patient is INPATIENT. Inpatient status is judged to be reasonable and necessary in order to provide the required intensity of service to ensure the patient's safety. The patient's presenting symptoms, physical exam findings, and initial radiographic and laboratory data in the context of their chronic comorbidities is felt to place them at high risk for further clinical deterioration. Furthermore, it is not anticipated that the patient will be medically stable for discharge from the hospital within 2 midnights of admission. The following factors support the patient status of inpatient.   " The patient's presenting symptoms include generalized weakness. " The worrisome physical exam findings include marked right lower extremity cellulitis, right thigh macular rash, generalized weakness. " The initial radiographic and laboratory data are worrisome because of marked leukocytosis. " The chronic co-morbidities include coronary artery disease, hypothyroidism.   * I certify that at the point of admission it is my clinical judgment that the patient will require inpatient hospital care spanning beyond 2 midnights from the point of admission due to high intensity of service, high risk for further deterioration and high frequency of  surveillance required.*   04/20/2019, 7:08 PM   Active Problems:   Cellulitis of right leg

## 2019-04-21 ENCOUNTER — Encounter (HOSPITAL_COMMUNITY): Payer: Medicare Other

## 2019-04-21 ENCOUNTER — Ambulatory Visit: Payer: Medicare Other | Admitting: Family Medicine

## 2019-04-21 LAB — BASIC METABOLIC PANEL
Anion gap: 10 (ref 5–15)
BUN: 31 mg/dL — ABNORMAL HIGH (ref 8–23)
CO2: 20 mmol/L — ABNORMAL LOW (ref 22–32)
Calcium: 8.3 mg/dL — ABNORMAL LOW (ref 8.9–10.3)
Chloride: 106 mmol/L (ref 98–111)
Creatinine, Ser: 1.27 mg/dL — ABNORMAL HIGH (ref 0.61–1.24)
GFR calc Af Amer: >60 mL/min (ref >60)
GFR calc non Af Amer: 52 mL/min — ABNORMAL LOW (ref >60)
Glucose, Bld: 98 mg/dL (ref 70–99)
Potassium: 4.4 mmol/L (ref 3.5–5.1)
Sodium: 136 mmol/L (ref 135–145)

## 2019-04-21 LAB — CBC
HCT: 32.2 % — ABNORMAL LOW (ref 39.0–52.0)
Hemoglobin: 10.7 g/dL — ABNORMAL LOW (ref 13.0–17.0)
MCH: 34.5 pg — ABNORMAL HIGH (ref 26.0–34.0)
MCHC: 33.2 g/dL (ref 30.0–36.0)
MCV: 103.9 fL — ABNORMAL HIGH (ref 80.0–100.0)
Platelets: 244 10*3/uL (ref 150–400)
RBC: 3.1 MIL/uL — ABNORMAL LOW (ref 4.22–5.81)
RDW: 13.5 % (ref 11.5–15.5)
WBC: 23.3 10*3/uL — ABNORMAL HIGH (ref 4.0–10.5)
nRBC: 0 % (ref 0.0–0.2)

## 2019-04-21 LAB — MRSA PCR SCREENING: MRSA by PCR: NEGATIVE

## 2019-04-21 MED ORDER — SODIUM CHLORIDE 0.9 % IV SOLN: INTRAVENOUS | Status: DC

## 2019-04-21 MED ORDER — INFLUENZA VAC A&B SA ADJ QUAD 0.5 ML IM PRSY
0.5000 mL | PREFILLED_SYRINGE | INTRAMUSCULAR | Status: AC
  Administered 2019-04-23: 0.5 mL via INTRAMUSCULAR
  Filled 2019-04-21: qty 0.5

## 2019-04-21 NOTE — Plan of Care (Signed)
  Problem: Skin Integrity: Goal: Risk for impaired skin integrity will decrease Outcome: Progressing   Problem: Safety: Goal: Ability to remain free from injury will improve Outcome: Progressing   Problem: Clinical Measurements: Goal: Ability to maintain clinical measurements within normal limits will improve Outcome: Progressing

## 2019-04-21 NOTE — Evaluation (Signed)
Physical Therapy Evaluation Patient Details Name: Zachary Decker MRN: [id number] DOB: Oct 31, 1932 Today's Date: 04/21/2019   History of Present Illness  Pt is an 85 y/o male admitted secondary to bilateral LE cellulitis and inability to ambulate. PMH including but not limited to MI, CAD s/p CABG.    Clinical Impression  Pt presented supine in bed with HOB elevated, awake and willing to participate in therapy session. Prior to admission, pt reported that he has been ambulating with a RW until recently when his legs just "gave out" on him. Pt stated that he requires assistance from family for ADLs sometimes. He lives with his wife in a single level home with a level entry. At the time of evaluation, pt greatly limited secondary to bilateral LE weakness and fatigue. He required max A for bed mobility and transfers. He performed sit<>stand transfers from EOB x3 with max A; however, unable to take any pivotal steps to transfer to recliner chair safely at this time secondary to his heavy posterior lean and unable to advance either LE. Pt would benefit from further intensive therapy services at a SNF prior to returning home with family support. Pt would continue to benefit from skilled physical therapy services at this time while admitted and after d/c to address the below listed limitations in order to improve overall safety and independence with functional mobility.     Follow Up Recommendations SNF    Equipment Recommendations  None recommended by PT    Recommendations for Other Services       Precautions / Restrictions Precautions Precautions: Fall Restrictions Weight Bearing Restrictions: No      Mobility  Bed Mobility Overal bed mobility: Needs Assistance Bed Mobility: Supine to Sit     Supine to sit: Max assist     General bed mobility comments: HOB significantly elevated, use of bed rails, assistance needed to move bilateral LEs off of bed and for trunk  elevation  Transfers Overall transfer level: Needs assistance Equipment used: Rolling walker (2 wheeled) Transfers: Sit to/from Stand Sit to Stand: Max assist;From elevated surface         General transfer comment: pt performed sit<>stand from EOB x3 with cueing and use of momentum; pt requiring max A each time to achieve full upright standing position; pt with posterior lean throughout, unable to self-correct  Ambulation/Gait             General Gait Details: unable at this time  Stairs            Wheelchair Mobility    Modified Rankin (Stroke Patients Only)       Balance Overall balance assessment: Needs assistance Sitting-balance support: Feet supported Sitting balance-Leahy Scale: Fair Sitting balance - Comments: pt able to sit up at EOB without back support with min guard for safety   Standing balance support: During functional activity;Bilateral upper extremity supported Standing balance-Leahy Scale: Poor Standing balance comment: max-total A required                             Pertinent Vitals/Pain Pain Assessment: No/denies pain    Home Living Family/patient expects to be discharged to:: Private residence Living Arrangements: Spouse/significant other Available Help at Discharge: Family;Available 24 hours/day Type of Home: House Home Access: Level entry     Home Layout: One level Home Equipment: Walker - 2 wheels;Wheelchair - manual      Prior Function Level of Independence: Needs assistance  Gait / Transfers Assistance Needed: has been ambulating with a RW  ADL's / Homemaking Assistance Needed: requires assistance sometimes from wife or his son        Hand Dominance        Extremity/Trunk Assessment   Upper Extremity Assessment Upper Extremity Assessment: Generalized weakness    Lower Extremity Assessment Lower Extremity Assessment: Generalized weakness    Cervical / Trunk Assessment Cervical / Trunk  Assessment: Kyphotic  Communication   Communication: No difficulties  Cognition Arousal/Alertness: Awake/alert Behavior During Therapy: Flat affect Overall Cognitive Status: No family/caregiver present to determine baseline cognitive functioning Area of Impairment: Safety/judgement;Problem solving                         Safety/Judgement: Decreased awareness of deficits;Decreased awareness of safety   Problem Solving: Slow processing;Decreased initiation;Difficulty sequencing;Requires verbal cues        General Comments      Exercises     Assessment/Plan    PT Assessment Patient needs continued PT services  PT Problem List Decreased strength;Decreased activity tolerance;Decreased balance;Decreased mobility;Decreased coordination;Decreased knowledge of use of DME;Decreased safety awareness;Decreased knowledge of precautions       PT Treatment Interventions DME instruction;Gait training;Functional mobility training;Therapeutic activities;Therapeutic exercise;Neuromuscular re-education;Balance training;Patient/family education    PT Goals (Current goals can be found in the Care Plan section)  Acute Rehab PT Goals Patient Stated Goal: to walk PT Goal Formulation: With patient Time For Goal Achievement: 05/05/19 Potential to Achieve Goals: Fair    Frequency Min 2X/week   Barriers to discharge        Co-evaluation               AM-PAC PT "6 Clicks" Mobility  Outcome Measure Help needed turning from your back to your side while in a flat bed without using bedrails?: Total Help needed moving from lying on your back to sitting on the side of a flat bed without using bedrails?: Total Help needed moving to and from a bed to a chair (including a wheelchair)?: Total Help needed standing up from a chair using your arms (e.g., wheelchair or bedside chair)?: Total Help needed to walk in hospital room?: Total Help needed climbing 3-5 steps with a railing? : Total 6  Click Score: 6    End of Session Equipment Utilized During Treatment: Gait belt Activity Tolerance: Patient limited by fatigue Patient left: in bed;with call bell/phone within reach;with bed alarm set;Other (comment)(sitting up at EOB to eat breakfast with RN present) Nurse Communication: Mobility status;Need for lift equipment PT Visit Diagnosis: Other abnormalities of gait and mobility (R26.89);Muscle weakness (generalized) (M62.81)    Time: [id number] PT Time Calculation (min) (ACUTE ONLY): 26 min   Charges:   PT Evaluation $PT Eval Moderate Complexity: 1 Mod PT Treatments $Therapeutic Activity: 8-22 mins        Sherie Don, PT, DPT  Acute Rehabilitation Services Pager [phone] Office [place] 04/21/2019, 11:11 AM

## 2019-04-21 NOTE — Progress Notes (Signed)
PROGRESS NOTE    Zachary Decker  [id number] DOB: Mar 19, 1933 DOA: 04/20/2019 PCP: [name], MD  Brief Narrative: 85 year old male with history of hypothyroidism, history of left leg cellulitis in April presented to the emergency room with worsening pain discomfort in his right leg associated with difficulty in ambulating, in the emergency room he was noted to have significant leukocytosis with white count of 31K   Assessment & Plan:   Severe right lower extremity cellulitis, extending to his thigh, with streaking lymphatics up to his groin -White count was 31,000 on admission last night -Blood cultures 1 out of 4 with staph, possibly contaminant however pending further identification will change ceftriaxone to IV vancomycin today Stop IV fluids -Elevate right lower extremity -Leukocytosis is improving  Generalized weakness, gait imbalance at home.   --Likely secondary to above, PT consult requested  Symmetric dilatation of lateral ventricles.  -This is unchanged from prior CT head imaging, cannot rule out NPH, patient does not have symptoms consistent with NPH however would benefit from neurology follow-up, I suspect his recent gait disturbance is secondary to extensive right leg cellulitis  -Physical therapy evaluation  Hypothyroidism --Continue Synthroid  Hyperlipidemia --Continue statin  CKD 3 -Baseline creatinine around 1.3, this is stable  DVT prophylaxis: enoxaparin Code Status: Full per patient Family Communication:  Discussed with patient, no family at bedside  Disposition, home likely in 81 to 72 hours     Subjective: -Feels a little better today, pain and discomfort in his right leg is starting to improve, not anywhere close to baseline, also extremely sleepy and tired this morning tells me that he did not get any sleep last night after admission Objective: Vitals:   04/20/19 2000 04/21/19 0153 04/21/19 0313 04/21/19 0739  BP: (!) 126/59  97/60 (!) 113/58 (!) 110/58  Pulse: 64 65 62 69  Resp:  17 17 17   Temp:  98.4 F (36.9 C) 97.9 F (36.6 C) 98.8 F (37.1 C)  TempSrc:  Oral Oral Oral  SpO2: 93% 95% 96% 94%  Weight:      Height:        Intake/Output Summary (Last 24 hours) at 04/21/2019 1035 Last data filed at 04/21/2019 0700 Gross per 24 hour  Intake 2440 ml  Output 150 ml  Net 2290 ml   Filed Weights   04/20/19 1645  Weight: 83.9 kg    Examination:  General exam: Elderly male, somnolent, easily arousable, oriented to self and place Respiratory system: Poor air movement bilaterally otherwise clear Cardiovascular system: S1 & S2 heard, RRR. Gastrointestinal system: Abdomen is nondistended, soft and nontender.Normal bowel sounds heard. Central nervous system: Alert and oriented. No focal neurological deficits. Extremities: Extensive redness warmth and swelling of his right leg extending to his upper thigh, lymphatic streaking on the medial aspect of his thigh going up to his groin Skin: As above Psychiatry: Flat affect    Data Reviewed:   CBC: Recent Labs  Lab 04/20/19 1237 04/21/19 0316  WBC 31.9* 23.3*  NEUTROABS 29.0*  --   HGB 11.6* 10.7*  HCT 34.8* 32.2*  MCV 102.7* 103.9*  PLT 256 [id number]   Basic Metabolic Panel: Recent Labs  Lab 04/20/19 1237 04/21/19 0316  NA 135 136  K 4.4 4.4  CL 105 106  CO2 20* 20*  GLUCOSE 128* 98  BUN 32* 31*  CREATININE 1.47* 1.27*  CALCIUM 8.8* 8.3*   GFR: Estimated Creatinine Clearance: 51.2 mL/min (A) (by C-G formula based on SCr of 1.27 mg/dL (  H)). Liver Function Tests: Recent Labs  Lab 04/20/19 1237  AST 27  ALT 20  ALKPHOS 45  BILITOT 1.0  PROT 6.5  ALBUMIN 3.4*   No results for input(s): LIPASE, AMYLASE in the last 168 hours. No results for input(s): AMMONIA in the last 168 hours. Coagulation Profile: No results for input(s): INR, PROTIME in the last 168 hours. Cardiac Enzymes: No results for input(s): CKTOTAL, CKMB, CKMBINDEX,  TROPONINI in the last 168 hours. BNP (last 3 results) No results for input(s): PROBNP in the last 8760 hours. HbA1C: No results for input(s): HGBA1C in the last 72 hours. CBG: No results for input(s): GLUCAP in the last 168 hours. Lipid Profile: No results for input(s): CHOL, HDL, LDLCALC, TRIG, CHOLHDL, LDLDIRECT in the last 72 hours. Thyroid Function Tests: No results for input(s): TSH, T4TOTAL, FREET4, T3FREE, THYROIDAB in the last 72 hours. Anemia Panel: No results for input(s): VITAMINB12, FOLATE, FERRITIN, TIBC, IRON, RETICCTPCT in the last 72 hours. Urine analysis:    Component Value Date/Time   COLORURINE YELLOW 10/07/2018 1700   APPEARANCEUR CLEAR 10/07/2018 1700   LABSPEC 1.025 10/07/2018 1700   PHURINE 5.0 10/07/2018 1700   GLUCOSEU NEGATIVE 10/07/2018 1700   HGBUR NEGATIVE 10/07/2018 1700   BILIRUBINUR NEGATIVE 10/07/2018 1700   BILIRUBINUR n 08/10/2012 1612   KETONESUR NEGATIVE 10/07/2018 1700   PROTEINUR NEGATIVE 10/07/2018 1700   UROBILINOGEN 0.2 08/10/2012 1612   UROBILINOGEN 0.2 02/24/2012 0133   NITRITE NEGATIVE 10/07/2018 1700   LEUKOCYTESUR NEGATIVE 10/07/2018 1700   Sepsis Labs: @LABRCNTIP (procalcitonin:4,lacticidven:4)  ) Recent Results (from the past 240 hour(s))  Culture, blood (Routine X 2) w Reflex to ID Panel     Status: None (Preliminary result)   Collection Time: 04/20/19  4:45 PM   Specimen: Site Not Specified; Blood  Result Value Ref Range Status   Specimen Description SITE NOT SPECIFIED  Final   Special Requests   Final    BOTTLES DRAWN AEROBIC AND ANAEROBIC Blood Culture adequate volume   Culture   Final    NO GROWTH < 24 HOURS Performed at [facility], [place] [address], [place], [place] [address]    Report Status PENDING  Incomplete  SARS CORONAVIRUS 2 (TAT 6-24 HRS) Nasopharyngeal Nasopharyngeal Swab     Status: None   Collection Time: 04/20/19  5:05 PM   Specimen: Nasopharyngeal Swab  Result Value Ref Range Status   SARS  Coronavirus 2 NEGATIVE NEGATIVE Final    Comment: (NOTE) SARS-CoV-2 target nucleic acids are NOT DETECTED. The SARS-CoV-2 RNA is generally detectable in upper and lower respiratory specimens during the acute phase of infection. Negative results do not preclude SARS-CoV-2 infection, do not rule out co-infections with other pathogens, and should not be used as the sole basis for treatment or other patient management decisions. Negative results must be combined with clinical observations, patient history, and epidemiological information. The expected result is Negative. Fact Sheet for Patients: SugarRoll.be Fact Sheet for Healthcare Providers: https://www.woods-mathews.com/ This test is not yet approved or cleared by the [place] FDA and  has been authorized for detection and/or diagnosis of SARS-CoV-2 by FDA under an Emergency Use Authorization (EUA). This EUA will remain  in effect (meaning this test can be used) for the duration of the COVID-19 declaration under Section 56 4(b)(1) of the Act, 21 U.S.C. section 360bbb-3(b)(1), unless the authorization is terminated or revoked sooner. Performed at [facility], [place] [address] [place], [place] [address]   Culture, blood (Routine X  2) w Reflex to ID Panel     Status: None (Preliminary result)   Collection Time: 04/20/19  5:37 PM   Specimen: BLOOD RIGHT HAND  Result Value Ref Range Status   Specimen Description BLOOD RIGHT HAND  Final   Special Requests   Final    BOTTLES DRAWN AEROBIC AND ANAEROBIC Blood Culture adequate volume   Culture  Setup Time   Final    GRAM POSITIVE COCCI AEROBIC BOTTLE ONLY CRITICAL RESULT CALLED TO, READ BACK BY AND VERIFIED WITH: [place] [id number] AT 1024 BY CM Performed at [facility], [place] [address] [place], [place] [address]    Culture GRAM POSITIVE COCCI  Final   Report Status PENDING  Incomplete  MRSA PCR Screening     Status:  None   Collection Time: 04/21/19  4:25 AM   Specimen: Nasal Mucosa; Nasopharyngeal  Result Value Ref Range Status   MRSA by PCR NEGATIVE NEGATIVE Final    Comment:        The GeneXpert MRSA Assay (FDA approved for NASAL specimens only), is one component of a comprehensive MRSA colonization surveillance program. It is not intended to diagnose MRSA infection nor to guide or monitor treatment for MRSA infections. Performed at [facility], [place] [address] [place], [place] [address]          Radiology Studies: Ct Head Wo Contrast  Result Date: 04/20/2019 CLINICAL DATA:  Difficulty with ambulation and balance. Generalized weakness. Probable right lower extremity cellulitis. No reported injury. EXAM: CT HEAD WITHOUT CONTRAST TECHNIQUE: Contiguous axial images were obtained from the base of the skull through the vertex without intravenous contrast. COMPARISON:  10/07/2018 head CT. FINDINGS: Brain: No evidence of parenchymal hemorrhage or extra-axial fluid collection. No mass lesion, mass effect, or midline shift. No CT evidence of acute infarction. Nonspecific mild subcortical and periventricular white matter hypodensity, most in keeping with chronic small vessel ischemic change. Generalized cerebral volume loss. There is symmetric dilatation of the lateral ventricles bilaterally, similar to prior. Vascular: No acute abnormality. Skull: No evidence of calvarial fracture. Sinuses/Orbits: The visualized paranasal sinuses are essentially clear. Other:  The mastoid air cells are unopacified. IMPRESSION: 1.  No evidence of acute intracranial abnormality. 2. Symmetric dilatation of the lateral ventricles, similar to prior head CT. While potentially due to central cerebral volume loss, the lateral ventricular dilatation appears somewhat out of proportion to the generalized cerebral volume loss, and normal pressure hydrocephalus cannot be strictly excluded. Neurology consultation could be  considered, as clinically warranted. 3. Mild chronic small vessel ischemic changes in the cerebral white matter. Electronically Signed   By: Ilona Sorrel M.D.   On: 04/20/2019 13:57        Scheduled Meds:  aspirin  81 mg Oral Daily   carvedilol  3.125 mg Oral BID WC   enoxaparin (LOVENOX) injection  40 mg Subcutaneous Q24H   [START ON 04/22/2019] influenza vaccine adjuvanted  0.5 mL Intramuscular Tomorrow-1000   levothyroxine  50 mcg Oral QAC breakfast   simvastatin  20 mg Oral QHS   sodium chloride flush  3 mL Intravenous Q12H   tamsulosin  0.4 mg Oral QHS   Continuous Infusions:  cefTRIAXone (ROCEPHIN)  IV 2 g (04/20/19 2243)     LOS: 1 day    Time spent: 28min    Domenic Polite, MD [facility] Page via [email or web address], password [email or web address] After 7PM please contact night-coverage  04/21/2019, 10:35 AM

## 2019-04-21 NOTE — Progress Notes (Signed)
PHARMACY - PHYSICIAN COMMUNICATION CRITICAL VALUE ALERT - BLOOD CULTURE IDENTIFICATION (BCID)  Zachary Decker is an 85 y.o. male who presented to [facility] on 04/20/2019 with a chief complaint of right lower leg cellulitis and difficulty walking. The cellulitis is nonpurulent, more of an uncomplicated picture per attending.  Assessment:  GPC 1/4 bottles   Name of physician (or Provider) Contacted: Dr. Jacinta Shoe  Current antibiotics: Ceftriaxone   Changes to prescribed antibiotics recommended:  Recommended to continue ceftriaxone for now, low suspicion for MRSA causing his cellulitis and suspicious this is a contaminant. Consider Cefazolin in the future for his cellulitis    No results found for this or any previous visit.  Marliss Czar [facility] 04/21/2019  10:24 AM

## 2019-04-21 NOTE — Progress Notes (Addendum)
Pt Noted with MASD groin and buttocks area. Cleansed and applied barrier cream. Noted  Oval shaped small rash on R Upper back area. Cleansed and dryed area. RLE with +2 edema. Cellulitus and redness noted. Pt also drools and often likes to sit up at a 60 angle. B/L UE's with scattered bruises.  CHG completed.  PCR swab sent.   Patient oriented to room environment. Call light and phone placed in reach. Bed exit alarm maintained.

## 2019-04-22 ENCOUNTER — Other Ambulatory Visit: Payer: Self-pay | Admitting: Family Medicine

## 2019-04-22 DIAGNOSIS — L03115 Cellulitis of right lower limb: Secondary | ICD-10-CM | POA: Diagnosis not present

## 2019-04-22 DIAGNOSIS — I2510 Atherosclerotic heart disease of native coronary artery without angina pectoris: Secondary | ICD-10-CM

## 2019-04-22 DIAGNOSIS — Z880 Allergy status to penicillin: Secondary | ICD-10-CM

## 2019-04-22 DIAGNOSIS — Z951 Presence of aortocoronary bypass graft: Secondary | ICD-10-CM

## 2019-04-22 DIAGNOSIS — B9561 Methicillin susceptible Staphylococcus aureus infection as the cause of diseases classified elsewhere: Secondary | ICD-10-CM | POA: Diagnosis not present

## 2019-04-22 DIAGNOSIS — R7881 Bacteremia: Secondary | ICD-10-CM

## 2019-04-22 DIAGNOSIS — Z872 Personal history of diseases of the skin and subcutaneous tissue: Secondary | ICD-10-CM

## 2019-04-22 DIAGNOSIS — Z888 Allergy status to other drugs, medicaments and biological substances status: Secondary | ICD-10-CM

## 2019-04-22 DIAGNOSIS — E785 Hyperlipidemia, unspecified: Secondary | ICD-10-CM

## 2019-04-22 DIAGNOSIS — Z886 Allergy status to analgesic agent status: Secondary | ICD-10-CM

## 2019-04-22 LAB — BASIC METABOLIC PANEL
Anion gap: 8 (ref 5–15)
BUN: 24 mg/dL — ABNORMAL HIGH (ref 8–23)
CO2: 23 mmol/L (ref 22–32)
Calcium: 8.4 mg/dL — ABNORMAL LOW (ref 8.9–10.3)
Chloride: 108 mmol/L (ref 98–111)
Creatinine, Ser: 1.12 mg/dL (ref 0.61–1.24)
GFR calc Af Amer: >60 mL/min (ref >60)
GFR calc non Af Amer: >60 mL/min (ref >60)
Glucose, Bld: 105 mg/dL — ABNORMAL HIGH (ref 70–99)
Potassium: 4.3 mmol/L (ref 3.5–5.1)
Sodium: 139 mmol/L (ref 135–145)

## 2019-04-22 LAB — CBC
HCT: 30.9 % — ABNORMAL LOW (ref 39.0–52.0)
Hemoglobin: 10.4 g/dL — ABNORMAL LOW (ref 13.0–17.0)
MCH: 34.4 pg — ABNORMAL HIGH (ref 26.0–34.0)
MCHC: 33.7 g/dL (ref 30.0–36.0)
MCV: 102.3 fL — ABNORMAL HIGH (ref 80.0–100.0)
Platelets: 217 10*3/uL (ref 150–400)
RBC: 3.02 MIL/uL — ABNORMAL LOW (ref 4.22–5.81)
RDW: 13.5 % (ref 11.5–15.5)
WBC: 16.4 10*3/uL — ABNORMAL HIGH (ref 4.0–10.5)
nRBC: 0 % (ref 0.0–0.2)

## 2019-04-22 MED ORDER — CEFAZOLIN SODIUM-DEXTROSE 2-4 GM/100ML-% IV SOLN
2.0000 g | Freq: Three times a day (TID) | INTRAVENOUS | Status: DC
  Administered 2019-04-22 – 2019-04-30 (×25): 2 g via INTRAVENOUS
  Filled 2019-04-22 (×28): qty 100

## 2019-04-22 NOTE — TOC Initial Note (Signed)
Transition of Care [facility]) - Initial/Assessment Note    Patient Details  Name: Zachary Decker MRN: [id number] Date of Birth: 07-30-1932  [facility] CM/SW Contact:    Atilano Median, LCSW Phone Number: 04/22/2019, 9:26 AM  Clinical Narrative:                 CSW met with patient to discuss PT recommendations. PT recommending SNF. Patient states that he would rather they come out to his home and he do home health physical therapy. Patient requested CSW speak with his daughter Olivia Mackie as well. CSW spoke with Olivia Mackie who is also in agreement with patient returning home with home health. CSW will speak with MD and make the appropriate referral.  Expected Discharge Plan: [place] Barriers to Discharge: Continued Medical Work up   Patient Goals and CMS Choice Patient states their goals for this hospitalization and ongoing recovery are:: return home CMS Medicare.gov Compare Post Acute Care list provided to:: Patient Choice offered to / list presented to : Patient  Expected Discharge Plan and Services Expected Discharge Plan: [place] In-house Referral: Clinical Social Work     Living arrangements for the past 2 months: [place]                   Prior Living Arrangements/Services Living arrangements for the past 2 months: Single Family Home Lives with:: Spouse          Need for Family Participation in Patient Care: Yes (Comment) Care giver support system in place?: Yes (comment)      Activities of Daily Living Home Assistive Devices/Equipment: Environmental consultant (specify type)(front wheel) ADL Screening (condition at time of admission) Patient's cognitive ability adequate to safely complete daily activities?: Yes Is the patient deaf or have difficulty hearing?: Yes Does the patient have difficulty seeing, even when wearing glasses/contacts?: No Does the patient have difficulty concentrating, remembering, or making decisions?:  No Patient able to express need for assistance with ADLs?: Yes Does the patient have difficulty dressing or bathing?: No Independently performs ADLs?: Yes (appropriate for developmental age) Does the patient have difficulty walking or climbing stairs?: Yes Weakness of Legs: Both Weakness of Arms/Hands: Both  Permission Sought/Granted Permission sought to share information with : Facility Art therapist granted to share information with : Yes, Verbal Permission Granted  Share Information with NAME: [name]     Permission granted to share info w Relationship: daughter  Permission granted to share info w Contact Information: [phone]  Emotional Assessment   Attitude/Demeanor/Rapport: Gracious   Orientation: : Oriented to Self, Oriented to Place, Oriented to  Time, Oriented to Situation      Admission diagnosis:  lt leg pain Patient Active Problem List   Diagnosis Date Noted  . Cellulitis of right leg 04/20/2019  . Dehydration 04/20/2019  . Generalized weakness 04/20/2019  . Cellulitis of left foot   . Cellulitis of left leg 10/07/2018  . Balance disorder 04/13/2018  . Senile purpura ([place]) 04/13/2018  . Insomnia 04/13/2018  . Low back pain 04/19/2015  . CKD (chronic kidney disease), stage III 04/20/2014  . Anxiety state 04/17/2014  . CAD w/ hx MI s/p CABG 09/07/2012  . ANEMIA, VITAMIN B12 DEFICIENCY 04/22/2010  . Cardiomyopathy, ischemic 03/22/2010  . Actinic keratosis 06/25/2009  . BASAL CELL CARCINOMA, NOSE 02/05/2009  . Localized osteoarthrosis, lower leg 12/06/2008  . HYPERCHOLESTEROLEMIA 10/31/2008  . BPH (benign prostatic hyperplasia) 09/28/2007  .  Hypothyroidism 03/26/2007  . Essential hypertension 03/22/2007  . GERD 03/22/2007   PCP:  [name], MD Pharmacy:   [id number] [place] [address]Phone: [phone]: [phone]    Social  Determinants of Health (SDOH) Interventions    Readmission Risk Interventions Readmission Risk Prevention Plan 10/09/2018  Transportation Screening Complete  PCP or Specialist Appt within 5-7 Days Complete  Home Care Screening Complete  Medication Review (RN CM) Complete  Some recent data might be hidden

## 2019-04-22 NOTE — Progress Notes (Deleted)
PROGRESS NOTE    Zachary Decker  [id number] DOB: Dec 08, 1932 DOA: 04/20/2019 PCP: [name], MD  Brief Narrative: 85 year old male with history of hypothyroidism, history of left leg cellulitis in April presented to the emergency room with worsening pain discomfort in his right leg associated with difficulty in ambulating, in the emergency room he was noted to have significant leukocytosis with white count of 31K  Assessment & Plan:   Severe right lower extremity cellulitis, extending to his thigh, with streaking lymphatics up to his groin -White count was 31,000 -down to 16K now -Blood cultures 1 out of 4 with staph-I suspect this is a contaminant, he has nonpurulent cellulitis which is improving very well on IV ceftriaxone -Physical therapy evaluation -Continue right leg elevation  Generalized weakness, gait imbalance at home.   --Likely secondary to above, PT consult requested  Symmetric dilatation of lateral ventricles.  -This is unchanged from prior CT head imaging, cannot rule out NPH, patient does not have symptoms consistent with NPH however would benefit from neurology follow-up, I suspect his recent gait disturbance is secondary to extensive right leg cellulitis  -Physical therapy evaluation  Hypothyroidism --Continue Synthroid  Hyperlipidemia --Continue statin  CKD 3 -Baseline creatinine around 1.3, this is stable  DVT prophylaxis: enoxaparin Code Status: Full per patient Family Communication:  No family at bedside Disposition: home in 1-2days      Subjective:  -Patient reports improvement in pain swelling and redness, -Has not gotten out of bed yet, anxious to start moving around  Objective: Vitals:   04/21/19 1625 04/21/19 2022 04/22/19 0321 04/22/19 0813  BP: 127/74 (!) 142/64 111/72 (!) 103/58  Pulse: 67 71 62 60  Resp: 15 18 17 14   Temp: 97.9 F (36.6 C) 98.1 F (36.7 C) 98.2 F (36.8 C) 98.4 F (36.9 C)  TempSrc: Oral Oral  Oral Oral  SpO2: 96% 96% 95% 96%  Weight:      Height:        Intake/Output Summary (Last 24 hours) at 04/22/2019 1520 Last data filed at 04/22/2019 1500 Gross per 24 hour  Intake 723 ml  Output 751 ml  Net -28 ml   Filed Weights   04/20/19 1645  Weight: 83.9 kg    Examination:  Gen: Early male sitting up in bed alert awake oriented x2 HEENT: PERRLA, Neck supple, no JVD Lungs: Clear CVS: RRR,No Gallops,Rubs or new Murmurs Abd: soft, Non tender, non distended, BS present Extremities: Extensive redness warmth and swelling of his right leg, significant improvement from yesterday  skin: As above Psychiatry: Flat affect    Data Reviewed:   CBC: Recent Labs  Lab 04/20/19 1237 04/21/19 0316 04/22/19 0455  WBC 31.9* 23.3* 16.4*  NEUTROABS 29.0*  --   --   HGB 11.6* 10.7* 10.4*  HCT 34.8* 32.2* 30.9*  MCV 102.7* 103.9* 102.3*  PLT 256 244 [id number]   Basic Metabolic Panel: Recent Labs  Lab 04/20/19 1237 04/21/19 0316 04/22/19 0455  NA 135 136 139  K 4.4 4.4 4.3  CL 105 106 108  CO2 20* 20* 23  GLUCOSE 128* 98 105*  BUN 32* 31* 24*  CREATININE 1.47* 1.27* 1.12  CALCIUM 8.8* 8.3* 8.4*   GFR: Estimated Creatinine Clearance: 58.1 mL/min (by C-G formula based on SCr of 1.12 mg/dL). Liver Function Tests: Recent Labs  Lab 04/20/19 1237  AST 27  ALT 20  ALKPHOS 45  BILITOT 1.0  PROT 6.5  ALBUMIN 3.4*   No results for input(s): LIPASE,  AMYLASE in the last 168 hours. No results for input(s): AMMONIA in the last 168 hours. Coagulation Profile: No results for input(s): INR, PROTIME in the last 168 hours. Cardiac Enzymes: No results for input(s): CKTOTAL, CKMB, CKMBINDEX, TROPONINI in the last 168 hours. BNP (last 3 results) No results for input(s): PROBNP in the last 8760 hours. HbA1C: No results for input(s): HGBA1C in the last 72 hours. CBG: No results for input(s): GLUCAP in the last 168 hours. Lipid Profile: No results for input(s): CHOL, HDL, LDLCALC,  TRIG, CHOLHDL, LDLDIRECT in the last 72 hours. Thyroid Function Tests: No results for input(s): TSH, T4TOTAL, FREET4, T3FREE, THYROIDAB in the last 72 hours. Anemia Panel: No results for input(s): VITAMINB12, FOLATE, FERRITIN, TIBC, IRON, RETICCTPCT in the last 72 hours. Urine analysis:    Component Value Date/Time   COLORURINE YELLOW 10/07/2018 1700   APPEARANCEUR CLEAR 10/07/2018 1700   LABSPEC 1.025 10/07/2018 1700   PHURINE 5.0 10/07/2018 1700   GLUCOSEU NEGATIVE 10/07/2018 1700   HGBUR NEGATIVE 10/07/2018 1700   BILIRUBINUR NEGATIVE 10/07/2018 1700   BILIRUBINUR n 08/10/2012 1612   KETONESUR NEGATIVE 10/07/2018 1700   PROTEINUR NEGATIVE 10/07/2018 1700   UROBILINOGEN 0.2 08/10/2012 1612   UROBILINOGEN 0.2 02/24/2012 0133   NITRITE NEGATIVE 10/07/2018 1700   LEUKOCYTESUR NEGATIVE 10/07/2018 1700   Sepsis Labs: @LABRCNTIP (procalcitonin:4,lacticidven:4)  ) Recent Results (from the past 240 hour(s))  Culture, blood (Routine X 2) w Reflex to ID Panel     Status: None (Preliminary result)   Collection Time: 04/20/19  4:45 PM   Specimen: Site Not Specified; Blood  Result Value Ref Range Status   Specimen Description SITE NOT SPECIFIED  Final   Special Requests   Final    BOTTLES DRAWN AEROBIC AND ANAEROBIC Blood Culture adequate volume   Culture   Final    NO GROWTH 2 DAYS Performed at [facility], [place] [address] [place], [place] [address]    Report Status PENDING  Incomplete  SARS CORONAVIRUS 2 (TAT 6-24 HRS) Nasopharyngeal Nasopharyngeal Swab     Status: None   Collection Time: 04/20/19  5:05 PM   Specimen: Nasopharyngeal Swab  Result Value Ref Range Status   SARS Coronavirus 2 NEGATIVE NEGATIVE Final    Comment: (NOTE) SARS-CoV-2 target nucleic acids are NOT DETECTED. The SARS-CoV-2 RNA is generally detectable in upper and lower respiratory specimens during the acute phase of infection. Negative results do not preclude SARS-CoV-2 infection, do not rule out  co-infections with other pathogens, and should not be used as the sole basis for treatment or other patient management decisions. Negative results must be combined with clinical observations, patient history, and epidemiological information. The expected result is Negative. Fact Sheet for Patients: SugarRoll.be Fact Sheet for Healthcare Providers: https://www.woods-mathews.com/ This test is not yet approved or cleared by the [place] FDA and  has been authorized for detection and/or diagnosis of SARS-CoV-2 by FDA under an Emergency Use Authorization (EUA). This EUA will remain  in effect (meaning this test can be used) for the duration of the COVID-19 declaration under Section 56 4(b)(1) of the Act, 21 U.S.C. section 360bbb-3(b)(1), unless the authorization is terminated or revoked sooner. Performed at [facility], [place] [address], [place], [place] [address]   Culture, blood (Routine X 2) w Reflex to ID Panel     Status: Abnormal (Preliminary result)   Collection Time: 04/20/19  5:37 PM   Specimen: BLOOD RIGHT HAND  Result Value Ref Range Status  Specimen Description BLOOD RIGHT HAND  Final   Special Requests   Final    BOTTLES DRAWN AEROBIC AND ANAEROBIC Blood Culture adequate volume   Culture  Setup Time   Final    GRAM POSITIVE COCCI AEROBIC BOTTLE ONLY CRITICAL RESULT CALLED TO, READ BACK BY AND VERIFIED WITH: Anton [place] [id number] AT 1024 BY CM Performed at [facility], [place] [address] [place], [place] [address]    Culture STAPHYLOCOCCUS AUREUS (A)  Final   Report Status PENDING  Incomplete  MRSA PCR Screening     Status: None   Collection Time: 04/21/19  4:25 AM   Specimen: Nasal Mucosa; Nasopharyngeal  Result Value Ref Range Status   MRSA by PCR NEGATIVE NEGATIVE Final    Comment:        The GeneXpert MRSA Assay (FDA approved for NASAL specimens only), is one component of a comprehensive MRSA  colonization surveillance program. It is not intended to diagnose MRSA infection nor to guide or monitor treatment for MRSA infections. Performed at [facility], [place] [address] [place], [place] [address]          Radiology Studies: No results found.      Scheduled Meds: . aspirin  81 mg Oral Daily  . carvedilol  3.125 mg Oral BID WC  . enoxaparin (LOVENOX) injection  40 mg Subcutaneous Q24H  . influenza vaccine adjuvanted  0.5 mL Intramuscular Tomorrow-1000  . levothyroxine  50 mcg Oral QAC breakfast  . simvastatin  20 mg Oral QHS  . sodium chloride flush  3 mL Intravenous Q12H  . tamsulosin  0.4 mg Oral QHS   Continuous Infusions: . cefTRIAXone (ROCEPHIN)  IV 2 g (04/21/19 1956)     LOS: 2 days    Time spent: 51min    Domenic Polite, MD [facility]  04/22/2019, 3:20 PM

## 2019-04-22 NOTE — Consult Note (Signed)
[name] for Infectious Disease  Total days of antibiotics 3/ceftriaxone-vanco               Reason for Consult: staph aureus bacteremia   Referring Physician: Broadus John  Principal Problem:   Cellulitis of right leg Active Problems:   Hypothyroidism   HYPERCHOLESTEROLEMIA   Dehydration   Generalized weakness    HPI: Zachary Decker is a 85 y.o. male with history of CAD s/p CABG, HLD and hx of right leg cellulitis (in April) who has worsening pain to right leg due to recurrence of cellulitis. He was found to have wbc of 31.9K with 90%N, started on vanco and ceftriaxone. He also reports that the associated swelling in his right leg is painful to ambulate. He has had a marked improvement in cellulitis.  His right leg has decreasing erythema though it is still somewhat swollen incomparison to unaffected leg. He denies any trauma or sores that have started. On exam, (see image in media) he does have onychomycosis and his 2nd digit appears to have possible purulence pocket near nail bed. Infectious work up shows 1 of 4 bottles with staph aureus, and he is not MRSA colonized. aki from admit is resolving  He is retired [employer]atering [employer].  Past Medical History:  Diagnosis Date   Acute myocardial infarction of inferoposterior wall, subsequent episode of care [facility])    CAD (coronary artery disease)    Encounter for long-term (current) use of other medications    Family history of malignant neoplasm of gastrointestinal tract    GERD (gastroesophageal reflux disease)    Hypercholesterolemia    Hypertension    Hypertrophy of prostate with urinary obstruction and other lower urinary tract symptoms (LUTS)    Personal history of thrombophlebitis    PVD (peripheral vascular disease) (HCC)    Respiratory failure (HCC)    Unspecified adverse effect of unspecified drug, medicinal and biological substance    Unspecified hypothyroidism     Allergies:  Allergies  Allergen  Reactions   Losartan Potassium Other (See Comments)    Not known   Naproxen Nausea And Vomiting   Penicillins Rash    Did it involve swelling of the face/tongue/throat, SOB, or low BP? No Did it involve sudden or severe rash/hives, skin peeling, or any reaction on the inside of your mouth or nose? Yes Did you need to seek medical attention at a hospital or doctor's office? Unknown When did it last happen? If all above answers are "NO", may proceed with cephalosporin use.  TOLERATED CEFTRIAXONE 10/07/2018    MEDICATIONS:  aspirin  81 mg Oral Daily   carvedilol  3.125 mg Oral BID WC   enoxaparin (LOVENOX) injection  40 mg Subcutaneous Q24H   influenza vaccine adjuvanted  0.5 mL Intramuscular Tomorrow-1000   levothyroxine  50 mcg Oral QAC breakfast   simvastatin  20 mg Oral QHS   sodium chloride flush  3 mL Intravenous Q12H   tamsulosin  0.4 mg Oral QHS    Social History   Tobacco Use   Smoking status: Never Smoker   Smokeless tobacco: Never Used  Substance Use Topics   Alcohol use: No   Drug use: No    Family History  Problem Relation Age of Onset   Heart disease Mother    Tuberculosis Father     Review of Systems  Constitutional: Negative for fever, chills, diaphoresis, activity change, appetite change, fatigue and unexpected weight change.  HENT: Negative for congestion, sore  throat, rhinorrhea, sneezing, trouble swallowing and sinus pressure.  Eyes: Negative for photophobia and visual disturbance.  Respiratory: Negative for cough, chest tightness, shortness of breath, wheezing and stridor.  Cardiovascular: Negative for chest pain, palpitations and leg swelling.  Gastrointestinal: Negative for nausea, vomiting, abdominal pain, diarrhea, constipation, blood in stool, abdominal distention and anal bleeding.  Genitourinary: Negative for dysuria, hematuria, flank pain and difficulty urinating.  Musculoskeletal: Negative for myalgias, back pain, joint  swelling, arthralgias and gait problem.  Skin: + erythema to right leg Neurological: Negative for dizziness, tremors, weakness and light-headedness.  Hematological: Negative for adenopathy. Does not bruise/bleed easily.  Psychiatric/Behavioral: Negative for behavioral problems, confusion, sleep disturbance, dysphoric mood, decreased concentration and agitation.     OBJECTIVE: Temp:  [97.9 F (36.6 C)-98.4 F (36.9 C)] 98.4 F (36.9 C) (10/30 0813) Pulse Rate:  [60-71] 60 (10/30 0813) Resp:  [14-18] 14 (10/30 0813) BP: (103-142)/(58-74) 103/58 (10/30 0813) SpO2:  [95 %-96 %] 96 % (10/30 0813) Physical Exam  Constitutional: He is oriented to person, place, and time. He appears well-developed and well-nourished. No distress.  HENT:  Mouth/Throat: Oropharynx is clear and moist. No oropharyngeal exudate.  Cardiovascular: Normal rate, regular rhythm and normal heart sounds. Exam reveals no gallop and no friction rub.  No murmur heard.  Pulmonary/Chest: Effort normal and breath sounds normal. No respiratory distress. He has no wheezes.  Abdominal: Soft. Bowel sounds are normal. He exhibits no distension. There is no tenderness.  Lymphadenopathy:  He has no cervical adenopathy.  Ext: right leg has pitting edema up to the knee, with erythema,  Neurological: He is alert and oriented to person, place, and time.  Skin: +blanching erythema, onychomycosis to nail bed, ? Purulence near 2nd digit Psychiatric: He has a normal mood and affect. His behavior is normal.     LABS: Results for orders placed or performed during the hospital encounter of 04/20/19 (from the past 48 hour(s))  Culture, blood (Routine X 2) w Reflex to ID Panel     Status: None (Preliminary result)   Collection Time: 04/20/19  4:45 PM   Specimen: Site Not Specified; Blood  Result Value Ref Range   Specimen Description SITE NOT SPECIFIED    Special Requests      BOTTLES DRAWN AEROBIC AND ANAEROBIC Blood Culture adequate  volume   Culture      NO GROWTH 2 DAYS Performed at [facility] [address], [place], [place] [address]    Report Status PENDING   SARS CORONAVIRUS 2 (TAT 6-24 HRS) Nasopharyngeal Nasopharyngeal Swab     Status: None   Collection Time: 04/20/19  5:05 PM   Specimen: Nasopharyngeal Swab  Result Value Ref Range   SARS Coronavirus 2 NEGATIVE NEGATIVE    Comment: (NOTE) SARS-CoV-2 target nucleic acids are NOT DETECTED. The SARS-CoV-2 RNA is generally detectable in upper and lower respiratory specimens during the acute phase of infection. Negative results do not preclude SARS-CoV-2 infection, do not rule out co-infections with other pathogens, and should not be used as the sole basis for treatment or other patient management decisions. Negative results must be combined with clinical observations, patient history, and epidemiological information. The expected result is Negative. Fact Sheet for Patients: SugarRoll.be Fact Sheet for Healthcare Providers: https://www.woods-mathews.com/ This test is not yet approved or cleared by the [place] FDA and  has been authorized for detection and/or diagnosis of SARS-CoV-2 by FDA under an Emergency Use Authorization (EUA). This EUA will remain  in effect (meaning  this test can be used) for the duration of the COVID-19 declaration under Section 56 4(b)(1) of the Act, 21 U.S.C. section 360bbb-3(b)(1), unless the authorization is terminated or revoked sooner. Performed at [facility], [place] [address] [place], [place] [address]   Lactic acid, plasma     Status: None   Collection Time: 04/20/19  5:06 PM  Result Value Ref Range   Lactic Acid, Venous 1.0 0.5 - 1.9 mmol/L    Comment: Performed at [facility] [address] [place], [place] [address]  Culture, blood (Routine X 2) w Reflex to ID Panel     Status: Abnormal (Preliminary result)   Collection Time: 04/20/19  5:37 PM    Specimen: BLOOD RIGHT HAND  Result Value Ref Range   Specimen Description BLOOD RIGHT HAND    Special Requests      BOTTLES DRAWN AEROBIC AND ANAEROBIC Blood Culture adequate volume   Culture  Setup Time      GRAM POSITIVE COCCI AEROBIC BOTTLE ONLY CRITICAL RESULT CALLED TO, READ BACK BY AND VERIFIED WITH: [place] [id number] AT 1024 BY CM Performed at [facility], [place] [address], [place], [place] [address]    Culture STAPHYLOCOCCUS AUREUS (A)    Report Status PENDING   Basic metabolic panel     Status: Abnormal   Collection Time: 04/21/19  3:16 AM  Result Value Ref Range   Sodium 136 135 - 145 mmol/L   Potassium 4.4 3.5 - 5.1 mmol/L   Chloride 106 98 - 111 mmol/L   CO2 20 (L) 22 - 32 mmol/L   Glucose, Bld 98 70 - 99 mg/dL   BUN 31 (H) 8 - 23 mg/dL   Creatinine, Ser 1.27 (H) 0.61 - 1.24 mg/dL   Calcium 8.3 (L) 8.9 - 10.3 mg/dL   GFR calc non Af Amer 52 (L) >60 mL/min   GFR calc Af Amer >60 >60 mL/min   Anion gap 10 5 - 15    Comment: Performed at [facility], [place] [address], [place], [place] [address]  CBC     Status: Abnormal   Collection Time: 04/21/19  3:16 AM  Result Value Ref Range   WBC 23.3 (H) 4.0 - 10.5 K/uL   RBC 3.10 (L) 4.22 - 5.81 MIL/uL   Hemoglobin 10.7 (L) 13.0 - 17.0 g/dL   HCT 32.2 (L) 39.0 - 52.0 %   MCV 103.9 (H) 80.0 - 100.0 fL   MCH 34.5 (H) 26.0 - 34.0 pg   MCHC 33.2 30.0 - 36.0 g/dL   RDW 13.5 11.5 - 15.5 %   Platelets 244 150 - 400 K/uL   nRBC 0.0 0.0 - 0.2 %    Comment: Performed at [facility], [place] [address], [place], [place] [address]  MRSA PCR Screening     Status: None   Collection Time: 04/21/19  4:25 AM   Specimen: Nasal Mucosa; Nasopharyngeal  Result Value Ref Range   MRSA by PCR NEGATIVE NEGATIVE    Comment:        The GeneXpert MRSA Assay (FDA approved for NASAL specimens only), is one component of a comprehensive MRSA colonization surveillance program. It is not intended to diagnose MRSA infection  nor to guide or monitor treatment for MRSA infections. Performed at [facility], [place] [address], [place], [place] [address]   CBC     Status: Abnormal   Collection Time: 04/22/19  4:55 AM  Result Value Ref Range   WBC 16.4 (H) 4.0 - 10.5 K/uL   RBC 3.02 (L) 4.22 - 5.81 MIL/uL   Hemoglobin 10.4 (L) 13.0 - 17.0 g/dL   HCT 30.9 (L) 39.0 - 52.0 %   MCV 102.3 (H) 80.0 - 100.0 fL   MCH 34.4 (H) 26.0 - 34.0 pg   MCHC 33.7 30.0 - 36.0 g/dL   RDW 13.5 11.5 - 15.5 %   Platelets 217 150 - 400 K/uL   nRBC 0.0 0.0 - 0.2 %    Comment: Performed at [facility], [place] [address] [place], [place] [id number]  Basic metabolic panel     Status: Abnormal   Collection Time: 04/22/19  4:55 AM  Result Value Ref Range   Sodium 139 135 - 145 mmol/L   Potassium 4.3 3.5 - 5.1 mmol/L   Chloride 108 98 - 111 mmol/L   CO2 23 22 - 32 mmol/L   Glucose, Bld 105 (H) 70 - 99 mg/dL   BUN 24 (H) 8 - 23 mg/dL   Creatinine, Ser 1.12 0.61 - 1.24 mg/dL   Calcium 8.4 (L) 8.9 - 10.3 mg/dL   GFR calc non Af Amer >60 >60 mL/min   GFR calc Af Amer >60 >60 mL/min   Anion gap 8 5 - 15    Comment: Performed at [facility], [place] [address], [place], [place] [address]    MICRO: 10/28 blood cx 1 of 4 bottles staph aureus, sensitivity is pending IMAGING: No results found.  Assessment/Plan:  85yo M with recurrent cellulitis to right leg with secondary staph aureus bacteremia  - recommend to change ceftriaxone to cefazolin 2gm IV Q 8hr - will need repeat blood cx today - recommend TTE to start evaluating for endocarditis - concern that toes/skin structure is the cause of cellulitis, start with plain xray of right foot to see if any signs of osteomyelitis - follow daily foot exam to see if superficial abscess develops on 2nd digit  Dr Linus Salmons to see on Monday, Dr Megan Salon available for questions.  Elzie Rings [facility] [phone]

## 2019-04-22 NOTE — Plan of Care (Signed)

## 2019-04-22 NOTE — Progress Notes (Signed)
PROGRESS NOTE    GAD EMGE  [id number] DOB: [birth year] DOA: 04/20/2019 PCP: [name], MD  Brief Narrative: 85 year old male with history of hypothyroidism, history of left leg cellulitis in April presented to the emergency room with worsening pain discomfort in his right leg associated with difficulty in ambulating, in the emergency room he was noted to have significant leukocytosis with white count of 31K  Assessment & Plan:   Severe right lower extremity cellulitis, extending to his thigh, with streaking lymphatics up to his groin -White count was 31,000 -down to 16K now -Blood cultures 1 out of 4 with staph-I suspect this is a contaminant, he has nonpurulent cellulitis which is improving very well on IV ceftriaxone -Physical therapy evaluation -Continue right leg elevation  Generalized weakness, gait imbalance at home.   --Likely secondary to above, PT consult requested  Symmetric dilatation of lateral ventricles.  -This is unchanged from prior CT head imaging, cannot rule out NPH, patient does not have symptoms consistent with NPH however would benefit from neurology follow-up, I suspect his recent gait disturbance is secondary to extensive right leg cellulitis  -Physical therapy evaluation  Hypothyroidism --Continue Synthroid  Hyperlipidemia --Continue statin  CKD 3 -Baseline creatinine around 1.3, this is stable  DVT prophylaxis: enoxaparin Code Status: Full per patient Family Communication:  No family at bedside Disposition: home in 1-2days      Subjective:  -Patient reports improvement in pain swelling and redness, -Has not gotten out of bed yet, anxious to start moving around  Objective: Vitals:   04/21/19 1625 04/21/19 2022 04/22/19 0321 04/22/19 0813  BP: 127/74 (!) 142/64 111/72 (!) 103/58  Pulse: 67 71 62 60  Resp: 15 18 17 14   Temp: 97.9 F (36.6 C) 98.1 F (36.7 C) 98.2 F (36.8 C) 98.4 F (36.9 C)  TempSrc: Oral Oral  Oral Oral  SpO2: 96% 96% 95% 96%  Weight:      Height:        Intake/Output Summary (Last 24 hours) at 04/22/2019 1348 Last data filed at 04/22/2019 1007 Gross per 24 hour  Intake 483 ml  Output 751 ml  Net -268 ml   Filed Weights   04/20/19 1645  Weight: 83.9 kg    Examination:  Gen: [place] male sitting up in bed alert awake oriented x2 HEENT: PERRLA, Neck supple, no JVD Lungs: Clear CVS: RRR,No Gallops,Rubs or new Murmurs Abd: soft, Non tender, non distended, BS present Extremities: Extensive redness warmth and swelling of his right leg, significant improvement from yesterday  skin: As above Psychiatry: Flat affect    Data Reviewed:   CBC: Recent Labs  Lab 04/20/19 1237 04/21/19 0316 04/22/19 0455  WBC 31.9* 23.3* 16.4*  NEUTROABS 29.0*  --   --   HGB 11.6* 10.7* 10.4*  HCT 34.8* 32.2* 30.9*  MCV 102.7* 103.9* 102.3*  PLT 256 244 [id number]   Basic Metabolic Panel: Recent Labs  Lab 04/20/19 1237 04/21/19 0316 04/22/19 0455  NA 135 136 139  K 4.4 4.4 4.3  CL 105 106 108  CO2 20* 20* 23  GLUCOSE 128* 98 105*  BUN 32* 31* 24*  CREATININE 1.47* 1.27* 1.12  CALCIUM 8.8* 8.3* 8.4*   GFR: Estimated Creatinine Clearance: 58.1 mL/min (by C-G formula based on SCr of 1.12 mg/dL). Liver Function Tests: Recent Labs  Lab 04/20/19 1237  AST 27  ALT 20  ALKPHOS 45  BILITOT 1.0  PROT 6.5  ALBUMIN 3.4*   No results for input(s): LIPASE,  AMYLASE in the last 168 hours. No results for input(s): AMMONIA in the last 168 hours. Coagulation Profile: No results for input(s): INR, PROTIME in the last 168 hours. Cardiac Enzymes: No results for input(s): CKTOTAL, CKMB, CKMBINDEX, TROPONINI in the last 168 hours. BNP (last 3 results) No results for input(s): PROBNP in the last 8760 hours. HbA1C: No results for input(s): HGBA1C in the last 72 hours. CBG: No results for input(s): GLUCAP in the last 168 hours. Lipid Profile: No results for input(s): CHOL, HDL, LDLCALC,  TRIG, CHOLHDL, LDLDIRECT in the last 72 hours. Thyroid Function Tests: No results for input(s): TSH, T4TOTAL, FREET4, T3FREE, THYROIDAB in the last 72 hours. Anemia Panel: No results for input(s): VITAMINB12, FOLATE, FERRITIN, TIBC, IRON, RETICCTPCT in the last 72 hours. Urine analysis:    Component Value Date/Time   COLORURINE YELLOW 10/07/2018 1700   APPEARANCEUR CLEAR 10/07/2018 1700   LABSPEC 1.025 10/07/2018 1700   PHURINE 5.0 10/07/2018 1700   GLUCOSEU NEGATIVE 10/07/2018 1700   HGBUR NEGATIVE 10/07/2018 1700   BILIRUBINUR NEGATIVE 10/07/2018 1700   BILIRUBINUR n 08/10/2012 1612   KETONESUR NEGATIVE 10/07/2018 1700   PROTEINUR NEGATIVE 10/07/2018 1700   UROBILINOGEN 0.2 08/10/2012 1612   UROBILINOGEN 0.2 02/24/2012 0133   NITRITE NEGATIVE 10/07/2018 1700   LEUKOCYTESUR NEGATIVE 10/07/2018 1700   Sepsis Labs: @LABRCNTIP (procalcitonin:4,lacticidven:4)  ) Recent Results (from the past 240 hour(s))  Culture, blood (Routine X 2) w Reflex to ID Panel     Status: None (Preliminary result)   Collection Time: 04/20/19  4:45 PM   Specimen: Site Not Specified; Blood  Result Value Ref Range Status   Specimen Description SITE NOT SPECIFIED  Final   Special Requests   Final    BOTTLES DRAWN AEROBIC AND ANAEROBIC Blood Culture adequate volume   Culture   Final    NO GROWTH 2 DAYS Performed at [facility], [place] [address] [place], [place] [address]    Report Status PENDING  Incomplete  SARS CORONAVIRUS 2 (TAT 6-24 HRS) Nasopharyngeal Nasopharyngeal Swab     Status: None   Collection Time: 04/20/19  5:05 PM   Specimen: Nasopharyngeal Swab  Result Value Ref Range Status   SARS Coronavirus 2 NEGATIVE NEGATIVE Final    Comment: (NOTE) SARS-CoV-2 target nucleic acids are NOT DETECTED. The SARS-CoV-2 RNA is generally detectable in upper and lower respiratory specimens during the acute phase of infection. Negative results do not preclude SARS-CoV-2 infection, do not rule out  co-infections with other pathogens, and should not be used as the sole basis for treatment or other patient management decisions. Negative results must be combined with clinical observations, patient history, and epidemiological information. The expected result is Negative. Fact Sheet for Patients: SugarRoll.be Fact Sheet for Healthcare Providers: https://www.woods-mathews.com/ This test is not yet approved or cleared by the [place] FDA and  has been authorized for detection and/or diagnosis of SARS-CoV-2 by FDA under an Emergency Use Authorization (EUA). This EUA will remain  in effect (meaning this test can be used) for the duration of the COVID-19 declaration under Section 56 4(b)(1) of the Act, 21 U.S.C. section 360bbb-3(b)(1), unless the authorization is terminated or revoked sooner. Performed at [facility], [place] [address] [place], [place] [address]   Culture, blood (Routine X 2) w Reflex to ID Panel     Status: Abnormal (Preliminary result)   Collection Time: 04/20/19  5:37 PM   Specimen: BLOOD RIGHT HAND  Result Value Ref Range Status  Specimen Description BLOOD RIGHT HAND  Final   Special Requests   Final    BOTTLES DRAWN AEROBIC AND ANAEROBIC Blood Culture adequate volume   Culture  Setup Time   Final    GRAM POSITIVE COCCI AEROBIC BOTTLE ONLY CRITICAL RESULT CALLED TO, READ BACK BY AND VERIFIED WITH: [place] [id number] AT 1024 BY CM Performed at [facility], [place] [address], [place], [place] [address]    Culture STAPHYLOCOCCUS AUREUS (A)  Final   Report Status PENDING  Incomplete  MRSA PCR Screening     Status: None   Collection Time: 04/21/19  4:25 AM   Specimen: Nasal Mucosa; Nasopharyngeal  Result Value Ref Range Status   MRSA by PCR NEGATIVE NEGATIVE Final    Comment:        The GeneXpert MRSA Assay (FDA approved for NASAL specimens only), is one component of a comprehensive MRSA  colonization surveillance program. It is not intended to diagnose MRSA infection nor to guide or monitor treatment for MRSA infections. Performed at [facility], [place] [address] [place], [place] [address]          Radiology Studies: No results found.      Scheduled Meds: . aspirin  81 mg Oral Daily  . carvedilol  3.125 mg Oral BID WC  . enoxaparin (LOVENOX) injection  40 mg Subcutaneous Q24H  . influenza vaccine adjuvanted  0.5 mL Intramuscular Tomorrow-1000  . levothyroxine  50 mcg Oral QAC breakfast  . simvastatin  20 mg Oral QHS  . sodium chloride flush  3 mL Intravenous Q12H  . tamsulosin  0.4 mg Oral QHS   Continuous Infusions: . cefTRIAXone (ROCEPHIN)  IV 2 g (04/21/19 1956)     LOS: 2 days    Time spent: 79min    Domenic Polite, MD [facility]  04/22/2019, 1:48 PM

## 2019-04-23 ENCOUNTER — Inpatient Hospital Stay (HOSPITAL_COMMUNITY): Payer: Medicare Other

## 2019-04-23 DIAGNOSIS — I361 Nonrheumatic tricuspid (valve) insufficiency: Secondary | ICD-10-CM

## 2019-04-23 DIAGNOSIS — I340 Nonrheumatic mitral (valve) insufficiency: Secondary | ICD-10-CM

## 2019-04-23 LAB — CBC
HCT: 32.9 % — ABNORMAL LOW (ref 39.0–52.0)
Hemoglobin: 10.9 g/dL — ABNORMAL LOW (ref 13.0–17.0)
MCH: 34 pg (ref 26.0–34.0)
MCHC: 33.1 g/dL (ref 30.0–36.0)
MCV: 102.5 fL — ABNORMAL HIGH (ref 80.0–100.0)
Platelets: 234 10*3/uL (ref 150–400)
RBC: 3.21 MIL/uL — ABNORMAL LOW (ref 4.22–5.81)
RDW: 13.2 % (ref 11.5–15.5)
WBC: 12.7 10*3/uL — ABNORMAL HIGH (ref 4.0–10.5)
nRBC: 0 % (ref 0.0–0.2)

## 2019-04-23 LAB — CULTURE, BLOOD (ROUTINE X 2): Special Requests: ADEQUATE

## 2019-04-23 LAB — BASIC METABOLIC PANEL
Anion gap: 7 (ref 5–15)
BUN: 20 mg/dL (ref 8–23)
CO2: 25 mmol/L (ref 22–32)
Calcium: 8.6 mg/dL — ABNORMAL LOW (ref 8.9–10.3)
Chloride: 106 mmol/L (ref 98–111)
Creatinine, Ser: 1.14 mg/dL (ref 0.61–1.24)
GFR calc Af Amer: >60 mL/min (ref >60)
GFR calc non Af Amer: 59 mL/min — ABNORMAL LOW (ref >60)
Glucose, Bld: 100 mg/dL — ABNORMAL HIGH (ref 70–99)
Potassium: 4.7 mmol/L (ref 3.5–5.1)
Sodium: 138 mmol/L (ref 135–145)

## 2019-04-23 NOTE — Progress Notes (Signed)
PROGRESS NOTE    Zachary Decker  [id number] DOB: 06/14/1933 DOA: 04/20/2019 PCP: [name], MD  Brief Narrative: 85 year old male with history of hypothyroidism, history of left leg cellulitis in April presented to the emergency room with worsening pain discomfort in his right leg associated with difficulty in ambulating, in the emergency room he was noted to have significant leukocytosis with white count of 31K  Assessment & Plan:   Sepsis, MSSA Bacteremia, Right lower extremity cellulitis -1 out of 2 blood cultures with MSSA, was on IV ceftriaxone now on IV Ancef -Clinically improving slowly, white count down from 31K on admission to 12 K now -Repeat blood cultures from 10/30 pending -Appreciate infectious disease consult, check 2D echocardiogram -Continue right leg elevation, -PT eval completed, SNF recommended, family and patient choose to go home with home health services instead  Acute kidney injury -Due to above sepsis, resolved  Generalized weakness, gait imbalance at home.   --secondary to above, out of bed, physical therapy  Symmetric dilatation of lateral ventricles.  -This is unchanged from prior CT head imaging, cannot rule out NPH, patient does not have symptoms consistent with NPH however would benefit from neurology follow-up, I suspect his recent gait disturbance is secondary to extensive right leg cellulitis  -Physical therapy evaluation  Hypothyroidism --Continue Synthroid  Hyperlipidemia --Continue statin  CKD 3 -Baseline creatinine around 1.3, this is stable  DVT prophylaxis: enoxaparin Code Status: Full per patient Family Communication:  No family at bedside, left message for daughter [name] today 10/31 and yesterday 10/30 Disposition: home in 48 hours if stable     Subjective:  -Reports feeling a bit weak but improving overall, right leg pain is also improving -Continues to decline rehab Objective: Vitals:   04/22/19  1650 04/22/19 1956 04/23/19 0346 04/23/19 0817  BP: (!) 145/77 (!) 141/68 126/68 (!) 120/57  Pulse: 63 (!) 55 61 (!) 58  Resp:  18 17 16   Temp:  (!) 97.4 F (36.3 C) 98.4 F (36.9 C) (!) 97.5 F (36.4 C)  TempSrc:  Oral Oral Oral  SpO2: 97% 99% 96% 96%  Weight:      Height:        Intake/Output Summary (Last 24 hours) at 04/23/2019 1250 Last data filed at 04/23/2019 0346 Gross per 24 hour  Intake 480 ml  Output 850 ml  Net -370 ml   Filed Weights   04/20/19 1645  Weight: 83.9 kg    Examination:  Gen: Elderly male, somnolent but easily arousable,, Oriented X 2, no distress HEENT: PERRLA, Neck supple, no JVD Lungs: Decreased breath sounds at both bases CVS: RRR,No Gallops,Rubs or new Murmurs Abd: soft, Non tender, non distended, BS present Extremities: Improving erythema warmth and tenderness of his right leg Skin: As above Psychiatry: Flat affect    Data Reviewed:   CBC: Recent Labs  Lab 04/20/19 1237 04/21/19 0316 04/22/19 0455 04/23/19 0331  WBC 31.9* 23.3* 16.4* 12.7*  NEUTROABS 29.0*  --   --   --   HGB 11.6* 10.7* 10.4* 10.9*  HCT 34.8* 32.2* 30.9* 32.9*  MCV 102.7* 103.9* 102.3* 102.5*  PLT 256 244 217 [id number]   Basic Metabolic Panel: Recent Labs  Lab 04/20/19 1237 04/21/19 0316 04/22/19 0455 04/23/19 0331  NA 135 136 139 138  K 4.4 4.4 4.3 4.7  CL 105 106 108 106  CO2 20* 20* 23 25  GLUCOSE 128* 98 105* 100*  BUN 32* 31* 24* 20  CREATININE 1.47* 1.27* 1.12  1.14  CALCIUM 8.8* 8.3* 8.4* 8.6*   GFR: Estimated Creatinine Clearance: 57.1 mL/min (by C-G formula based on SCr of 1.14 mg/dL). Liver Function Tests: Recent Labs  Lab 04/20/19 1237  AST 27  ALT 20  ALKPHOS 45  BILITOT 1.0  PROT 6.5  ALBUMIN 3.4*   No results for input(s): LIPASE, AMYLASE in the last 168 hours. No results for input(s): AMMONIA in the last 168 hours. Coagulation Profile: No results for input(s): INR, PROTIME in the last 168 hours. Cardiac Enzymes: No results  for input(s): CKTOTAL, CKMB, CKMBINDEX, TROPONINI in the last 168 hours. BNP (last 3 results) No results for input(s): PROBNP in the last 8760 hours. HbA1C: No results for input(s): HGBA1C in the last 72 hours. CBG: No results for input(s): GLUCAP in the last 168 hours. Lipid Profile: No results for input(s): CHOL, HDL, LDLCALC, TRIG, CHOLHDL, LDLDIRECT in the last 72 hours. Thyroid Function Tests: No results for input(s): TSH, T4TOTAL, FREET4, T3FREE, THYROIDAB in the last 72 hours. Anemia Panel: No results for input(s): VITAMINB12, FOLATE, FERRITIN, TIBC, IRON, RETICCTPCT in the last 72 hours. Urine analysis:    Component Value Date/Time   COLORURINE YELLOW 10/07/2018 1700   APPEARANCEUR CLEAR 10/07/2018 1700   LABSPEC 1.025 10/07/2018 1700   PHURINE 5.0 10/07/2018 1700   GLUCOSEU NEGATIVE 10/07/2018 1700   HGBUR NEGATIVE 10/07/2018 1700   BILIRUBINUR NEGATIVE 10/07/2018 1700   BILIRUBINUR n 08/10/2012 1612   KETONESUR NEGATIVE 10/07/2018 1700   PROTEINUR NEGATIVE 10/07/2018 1700   UROBILINOGEN 0.2 08/10/2012 1612   UROBILINOGEN 0.2 02/24/2012 0133   NITRITE NEGATIVE 10/07/2018 1700   LEUKOCYTESUR NEGATIVE 10/07/2018 1700   Sepsis Labs: @LABRCNTIP (procalcitonin:4,lacticidven:4)  ) Recent Results (from the past 240 hour(s))  Culture, blood (Routine X 2) w Reflex to ID Panel     Status: None (Preliminary result)   Collection Time: 04/20/19  4:45 PM   Specimen: Site Not Specified; Blood  Result Value Ref Range Status   Specimen Description SITE NOT SPECIFIED  Final   Special Requests   Final    BOTTLES DRAWN AEROBIC AND ANAEROBIC Blood Culture adequate volume   Culture   Final    NO GROWTH 3 DAYS Performed at [facility], [place] [address], [place], [place] [address]    Report Status PENDING  Incomplete  SARS CORONAVIRUS 2 (TAT 6-24 HRS) Nasopharyngeal Nasopharyngeal Swab     Status: None   Collection Time: 04/20/19  5:05 PM   Specimen: Nasopharyngeal Swab   Result Value Ref Range Status   SARS Coronavirus 2 NEGATIVE NEGATIVE Final    Comment: (NOTE) SARS-CoV-2 target nucleic acids are NOT DETECTED. The SARS-CoV-2 RNA is generally detectable in upper and lower respiratory specimens during the acute phase of infection. Negative results do not preclude SARS-CoV-2 infection, do not rule out co-infections with other pathogens, and should not be used as the sole basis for treatment or other patient management decisions. Negative results must be combined with clinical observations, patient history, and epidemiological information. The expected result is Negative. Fact Sheet for Patients: SugarRoll.be Fact Sheet for Healthcare Providers: https://www.woods-mathews.com/ This test is not yet approved or cleared by the [place] FDA and  has been authorized for detection and/or diagnosis of SARS-CoV-2 by FDA under an Emergency Use Authorization (EUA). This EUA will remain  in effect (meaning this test can be used) for the duration of the COVID-19 declaration under Section 56 4(b)(1) of the Act, 21 U.S.C. section 360bbb-3(b)(1), unless the authorization is terminated  or revoked sooner. Performed at [facility], [place] [address], [place], [place] [address]   Culture, blood (Routine X 2) w Reflex to ID Panel     Status: Abnormal   Collection Time: 04/20/19  5:37 PM   Specimen: BLOOD RIGHT HAND  Result Value Ref Range Status   Specimen Description BLOOD RIGHT HAND  Final   Special Requests   Final    BOTTLES DRAWN AEROBIC AND ANAEROBIC Blood Culture adequate volume   Culture  Setup Time   Final    GRAM POSITIVE COCCI AEROBIC BOTTLE ONLY CRITICAL RESULT CALLED TO, READ BACK BY AND VERIFIED WITH: [place] [id number] AT 1024 BY CM Performed at [facility], [place] [address] [place], [place] [address]    Culture STAPHYLOCOCCUS AUREUS (A)  Final   Report Status 04/23/2019 FINAL  Final    Organism ID, Bacteria STAPHYLOCOCCUS AUREUS  Final      Susceptibility   Staphylococcus aureus - MIC*    CIPROFLOXACIN <=0.5 SENSITIVE Sensitive     ERYTHROMYCIN <=0.25 SENSITIVE Sensitive     GENTAMICIN <=0.5 SENSITIVE Sensitive     OXACILLIN 0.5 SENSITIVE Sensitive     TETRACYCLINE <=1 SENSITIVE Sensitive     VANCOMYCIN <=0.5 SENSITIVE Sensitive     TRIMETH/SULFA <=10 SENSITIVE Sensitive     CLINDAMYCIN <=0.25 SENSITIVE Sensitive     RIFAMPIN <=0.5 SENSITIVE Sensitive     Inducible Clindamycin NEGATIVE Sensitive     * STAPHYLOCOCCUS AUREUS  MRSA PCR Screening     Status: None   Collection Time: 04/21/19  4:25 AM   Specimen: Nasal Mucosa; Nasopharyngeal  Result Value Ref Range Status   MRSA by PCR NEGATIVE NEGATIVE Final    Comment:        The GeneXpert MRSA Assay (FDA approved for NASAL specimens only), is one component of a comprehensive MRSA colonization surveillance program. It is not intended to diagnose MRSA infection nor to guide or monitor treatment for MRSA infections. Performed at [facility], [place] [address] [place], [place] [address]   Culture, blood (routine x 2)     Status: None (Preliminary result)   Collection Time: 04/22/19  4:30 PM   Specimen: BLOOD LEFT ARM  Result Value Ref Range Status   Specimen Description BLOOD LEFT ARM  Final   Special Requests   Final    BOTTLES DRAWN AEROBIC AND ANAEROBIC Blood Culture adequate volume   Culture   Final    NO GROWTH < 24 HOURS Performed at [facility], [place] [address], [place], [place] [address]    Report Status PENDING  Incomplete  Culture, blood (routine x 2)     Status: None (Preliminary result)   Collection Time: 04/22/19  4:35 PM   Specimen: BLOOD LEFT ARM  Result Value Ref Range Status   Specimen Description BLOOD LEFT ARM  Final   Special Requests   Final    BOTTLES DRAWN AEROBIC AND ANAEROBIC Blood Culture adequate volume   Culture   Final    NO GROWTH < 24 HOURS Performed at [facility], [place] [address], [place], [place] [address]    Report Status PENDING  Incomplete         Radiology Studies: No results found.      Scheduled Meds: . aspirin  81 mg Oral Daily  . carvedilol  3.125 mg Oral BID WC  . enoxaparin (LOVENOX) injection  40 mg Subcutaneous Q24H  . levothyroxine  50 mcg Oral QAC breakfast  . simvastatin  20 mg Oral QHS  . sodium chloride flush  3 mL Intravenous Q12H  . tamsulosin  0.4 mg Oral QHS   Continuous Infusions: .  ceFAZolin (ANCEF) IV 2 g (04/23/19 0601)     LOS: 3 days    Time spent: 58min    Domenic Polite, MD [facility]  04/23/2019, 12:50 PM

## 2019-04-23 NOTE — Progress Notes (Signed)
Occupational Therapy Evaluation Patient Details Name: Zachary Decker MRN: [id number] DOB: [birth year] Today's Date: 04/23/2019    History of Present Illness Pt is an 85 y/o male admitted secondary to bilateral LE cellulitis and inability to ambulate. PMH including but not limited to MI, CAD s/p CABG.   Clinical Impression   Daughter present during session. Pt is Max A +2 with sit - stand and unable to maintain standing due to significant posterior and L lateral lean. Required use of Stedy to transfer from bed to chair due to pt being unable to take pivotal steps at this time. Daughter declines use of [facility] and states they will "do it themselves". Recommend non-emergent ambulance transport home however daughter declines. Pt lethargic and difficulty maintaining eyes open; difficulty with initiation and states he gets "frozen". Recommend HHOT and 24/7 S/Assistance given family declining rehab at [facility]. Will follow acutely to facilitate safe DC home.     Follow Up Recommendations  SNF;Home health OT;Supervision/Assistance - 24 hour(family declining SNF)    Equipment Recommendations  Hospital bed    Recommendations for Other Services       Precautions / Restrictions Precautions Precautions: Fall      Mobility Bed Mobility               General bed mobility comments: OOB in chair  Transfers     Transfers: Sit to/from Stand Sit to Stand: Max assist;+2 physical assistance         General transfer comment: Pt uses lift chair    Balance     Sitting balance-Leahy Scale: Poor Sitting balance - Comments: L lat lean     Standing balance-Leahy Scale: Zero                             ADL either performed or assessed with clinical judgement   ADL Overall ADL's : Needs assistance/impaired Eating/Feeding: Supervision/ safety;Set up;Sitting   Grooming: Moderate assistance;Sitting   Upper Body Bathing: Moderate assistance;Sitting   Lower Body Bathing: Maximal  assistance;Sit to/from stand   Upper Body Dressing : Maximal assistance;Sitting   Lower Body Dressing: Total assistance;Sit to/from stand   Toilet Transfer: Maximal assistance;+2 for physical assistance           Functional mobility during ADLs: Maximal assistance;+2 for physical assistance General ADL Comments: unable to take pivotal steps to commode     Vision         Perception     Praxis      Pertinent Vitals/Pain Pain Assessment: Faces Faces Pain Scale: Hurts little more Pain Location: RLE Pain Descriptors / Indicators: Aching;Discomfort Pain Intervention(s): Limited activity within patient's tolerance     Hand Dominance Right   Extremity/Trunk Assessment Upper Extremity Assessment Upper Extremity Assessment: Generalized weakness(limited to @ 90 shoulder flex B)   Lower Extremity Assessment Lower Extremity Assessment: Defer to PT evaluation   Cervical / Trunk Assessment Cervical / Trunk Assessment: Kyphotic   Communication     Cognition Arousal/Alertness: Lethargic Behavior During Therapy: Flat affect Overall Cognitive Status: History of cognitive impairments - at baseline Area of Impairment: Attention;Memory;Following commands;Safety/judgement;Awareness;Problem solving                   Current Attention Level: Sustained Memory: Decreased short-term memory Following Commands: Follows one step commands with increased time Safety/Judgement: Decreased awareness of safety;Decreased awareness of deficits Awareness: Emergent Problem Solving: Slow processing;Decreased initiation;Difficulty sequencing;Requires verbal cues;Requires tactile cues General Comments: unaware  of posterior and L lateral lean   General Comments       Exercises Exercises: Other exercises Other Exercises Other Exercises: chair marching x 20 Other Exercises: SAQ x 20   Shoulder Instructions      Home Living Family/patient expects to be discharged to:: Private  residence Living Arrangements: Spouse/significant other Available Help at Discharge: Family;Available 24 hours/day Type of Home: House Home Access: Level entry     Home Layout: One level     Bathroom Shower/Tub: Occupational psychologist: Handicapped height Bathroom Accessibility: Yes How Accessible: Accessible via wheelchair Home Equipment: [place] - 2 wheels;Wheelchair - manual;Tub bench;Transport chair          Prior Functioning/Environment Level of Independence: Needs assistance  Gait / Transfers Assistance Needed: has been ambulating with a RW ADL's / Homemaking Assistance Needed: requires assistance sometimes from wife or his son            OT Problem List: Decreased strength;Decreased range of motion;Decreased activity tolerance;Impaired balance (sitting and/or standing);Decreased coordination;Decreased cognition;Decreased safety awareness;Decreased knowledge of use of DME or AE;Obesity;Pain;Impaired UE functional use;Increased edema      OT Treatment/Interventions: Self-care/ADL training;Therapeutic exercise;DME and/or AE instruction;Neuromuscular education;Therapeutic activities;Cognitive remediation/compensation;Patient/family education;Balance training    OT Goals(Current goals can be found in the care plan section) Acute Rehab OT Goals Patient Stated Goal: to walk OT Goal Formulation: With patient Time For Goal Achievement: 05/07/19 Potential to Achieve Goals: Good  OT Frequency: Min 3X/week(family declining SNF)   Barriers to D/C:            Co-evaluation              AM-PAC OT "6 Clicks" Daily Activity     Outcome Measure Help from another person eating meals?: A Little Help from another person taking care of personal grooming?: A Lot Help from another person toileting, which includes using toliet, bedpan, or urinal?: Total Help from another person bathing (including washing, rinsing, drying)?: A Lot Help from another person to put on  and taking off regular upper body clothing?: A Lot Help from another person to put on and taking off regular lower body clothing?: Total 6 Click Score: 11   End of Session Equipment Utilized During Treatment: Gait belt;Rolling walker Nurse Communication: Mobility status  Activity Tolerance: Patient tolerated treatment well Patient left: in chair;with call bell/phone within reach;with chair alarm set;with family/visitor present  OT Visit Diagnosis: Unsteadiness on feet (R26.81);Other abnormalities of gait and mobility (R26.89);Muscle weakness (generalized) (M62.81);Other symptoms and signs involving cognitive function;Pain Pain - Right/Left: Right Pain - part of body: Leg                Time: [id number] OT Time Calculation (min): 25 min Charges:  OT General Charges $OT Visit: 1 Visit OT Evaluation $OT Eval Moderate Complexity: 1 Mod OT Treatments $Self Care/Home Management : 8-22 mins  Maurie Boettcher, OT/L   Acute OT Clinical Specialist Ginger Blue Pager [phone] Office [phone]   [facility] 04/23/2019, 3:03 PM

## 2019-04-24 ENCOUNTER — Encounter (HOSPITAL_COMMUNITY): Payer: Self-pay | Admitting: *Deleted

## 2019-04-24 LAB — CBC
HCT: 31.8 % — ABNORMAL LOW (ref 39.0–52.0)
Hemoglobin: 10.7 g/dL — ABNORMAL LOW (ref 13.0–17.0)
MCH: 34.2 pg — ABNORMAL HIGH (ref 26.0–34.0)
MCHC: 33.6 g/dL (ref 30.0–36.0)
MCV: 101.6 fL — ABNORMAL HIGH (ref 80.0–100.0)
Platelets: 253 10*3/uL (ref 150–400)
RBC: 3.13 MIL/uL — ABNORMAL LOW (ref 4.22–5.81)
RDW: 13.2 % (ref 11.5–15.5)
WBC: 10.7 10*3/uL — ABNORMAL HIGH (ref 4.0–10.5)
nRBC: 0 % (ref 0.0–0.2)

## 2019-04-24 NOTE — Plan of Care (Signed)

## 2019-04-24 NOTE — Plan of Care (Signed)
  Problem: Coping: Goal: Level of anxiety will decrease Outcome: Progressing   Problem: Pain Managment: Goal: General experience of comfort will improve Outcome: Progressing   Problem: Safety: Goal: Ability to remain free from injury will improve Outcome: Progressing   Problem: Skin Integrity: Goal: Risk for impaired skin integrity will decrease Outcome: Progressing   

## 2019-04-24 NOTE — Progress Notes (Signed)
PROGRESS NOTE    Zachary Decker  [id number] DOB: [birth year] DOA: 04/20/2019 PCP: [name], MD  Brief Narrative: 85 year old male with history of hypothyroidism, history of left leg cellulitis in April presented to the emergency room with worsening pain discomfort in his right leg associated with difficulty in ambulating, in the emergency room he was noted to have significant leukocytosis with white count of 31K  Assessment & Plan:   Sepsis, MSSA Bacteremia, Right lower extremity cellulitis -1 out of 2 blood cultures with MSSA, was on IV ceftriaxone now on IV Ancef -Clinically improving slowly, white count down from 31K on admission to 12 K now -Repeat blood cultures from 10/30 negative x2days -Appreciate infectious disease input, 2D echocardiogram with normal EF and no vegetations -Continue right leg elevation, -PT eval completed, SNF recommended, family and patient choose to go home with home health services instead -discharge planning  Acute kidney injury -Due to above sepsis, resolved  Generalized weakness, gait imbalance at home.   --secondary to above, out of bed, physical therapy -suspect movement disorder at baseline -d/w daughter recommended Outpatient Neurology consult  Symmetric dilatation of lateral ventricles.  -This is unchanged from prior CT head imaging, cannot rule out NPH,  -outpatient Neuro FU -Physical therapy evaluation completed  Hypothyroidism --Continue Synthroid  Hyperlipidemia --Continue statin  CKD 3 -Baseline creatinine around 1.3, this is stable  DVT prophylaxis: enoxaparin Code Status: Full per patient Family Communication:  No family at bedside, called and updated daughter [name]  Disposition: home in 48 hours if stable     Subjective: -feels ok, working with PT, OT  Objective: Vitals:   04/23/19 1652 04/23/19 1929 04/24/19 0358 04/24/19 0735  BP: 128/66 129/74 135/83 139/76  Pulse: 60 (!) 59 62 (!) 57   Resp: 15   16  Temp: 97.6 F (36.4 C) 98.3 F (36.8 C) 98 F (36.7 C) 98.4 F (36.9 C)  TempSrc: Oral Oral Oral Oral  SpO2: 98% 98% 92% 97%  Weight:      Height:        Intake/Output Summary (Last 24 hours) at 04/24/2019 1304 Last data filed at 04/24/2019 0815 Gross per 24 hour  Intake 224 ml  Output -  Net 224 ml   Filed Weights   04/20/19 1645  Weight: 83.9 kg    Examination:  Gen: Awake, Alert, Oriented X 2, somnolent but easily arouses, no distress HEENT: PERRLA, Neck supple, no JVD Lungs: Good air movement bilaterally, CTAB CVS: RRR,No Gallops,Rubs or new Murmurs Abd: soft, Non tender, non distended, BS present Extremities: Improving erythema warmth and tenderness of his right leg Skin: As above Psychiatry: Flat affect    Data Reviewed:   CBC: Recent Labs  Lab 04/20/19 1237 04/21/19 0316 04/22/19 0455 04/23/19 0331 04/24/19 0346  WBC 31.9* 23.3* 16.4* 12.7* 10.7*  NEUTROABS 29.0*  --   --   --   --   HGB 11.6* 10.7* 10.4* 10.9* 10.7*  HCT 34.8* 32.2* 30.9* 32.9* 31.8*  MCV 102.7* 103.9* 102.3* 102.5* 101.6*  PLT 256 244 217 234 [id number]   Basic Metabolic Panel: Recent Labs  Lab 04/20/19 1237 04/21/19 0316 04/22/19 0455 04/23/19 0331  NA 135 136 139 138  K 4.4 4.4 4.3 4.7  CL 105 106 108 106  CO2 20* 20* 23 25  GLUCOSE 128* 98 105* 100*  BUN 32* 31* 24* 20  CREATININE 1.47* 1.27* 1.12 1.14  CALCIUM 8.8* 8.3* 8.4* 8.6*   GFR: Estimated Creatinine Clearance: 57.1  mL/min (by C-G formula based on SCr of 1.14 mg/dL). Liver Function Tests: Recent Labs  Lab 04/20/19 1237  AST 27  ALT 20  ALKPHOS 45  BILITOT 1.0  PROT 6.5  ALBUMIN 3.4*   No results for input(s): LIPASE, AMYLASE in the last 168 hours. No results for input(s): AMMONIA in the last 168 hours. Coagulation Profile: No results for input(s): INR, PROTIME in the last 168 hours. Cardiac Enzymes: No results for input(s): CKTOTAL, CKMB, CKMBINDEX, TROPONINI in the last 168 hours. BNP  (last 3 results) No results for input(s): PROBNP in the last 8760 hours. HbA1C: No results for input(s): HGBA1C in the last 72 hours. CBG: No results for input(s): GLUCAP in the last 168 hours. Lipid Profile: No results for input(s): CHOL, HDL, LDLCALC, TRIG, CHOLHDL, LDLDIRECT in the last 72 hours. Thyroid Function Tests: No results for input(s): TSH, T4TOTAL, FREET4, T3FREE, THYROIDAB in the last 72 hours. Anemia Panel: No results for input(s): VITAMINB12, FOLATE, FERRITIN, TIBC, IRON, RETICCTPCT in the last 72 hours. Urine analysis:    Component Value Date/Time   COLORURINE YELLOW 10/07/2018 1700   APPEARANCEUR CLEAR 10/07/2018 1700   LABSPEC 1.025 10/07/2018 1700   PHURINE 5.0 10/07/2018 1700   GLUCOSEU NEGATIVE 10/07/2018 1700   HGBUR NEGATIVE 10/07/2018 1700   BILIRUBINUR NEGATIVE 10/07/2018 1700   BILIRUBINUR n 08/10/2012 1612   KETONESUR NEGATIVE 10/07/2018 1700   PROTEINUR NEGATIVE 10/07/2018 1700   UROBILINOGEN 0.2 08/10/2012 1612   UROBILINOGEN 0.2 02/24/2012 0133   NITRITE NEGATIVE 10/07/2018 1700   LEUKOCYTESUR NEGATIVE 10/07/2018 1700   Sepsis Labs: @LABRCNTIP (procalcitonin:4,lacticidven:4)  ) Recent Results (from the past 240 hour(s))  Culture, blood (Routine X 2) w Reflex to ID Panel     Status: None (Preliminary result)   Collection Time: 04/20/19  4:45 PM   Specimen: Site Not Specified; Blood  Result Value Ref Range Status   Specimen Description SITE NOT SPECIFIED  Final   Special Requests   Final    BOTTLES DRAWN AEROBIC AND ANAEROBIC Blood Culture adequate volume   Culture   Final    NO GROWTH 4 DAYS Performed at [facility], [place] [address] [place], [place] [address]    Report Status PENDING  Incomplete  SARS CORONAVIRUS 2 (TAT 6-24 HRS) Nasopharyngeal Nasopharyngeal Swab     Status: None   Collection Time: 04/20/19  5:05 PM   Specimen: Nasopharyngeal Swab  Result Value Ref Range Status   SARS Coronavirus 2 NEGATIVE NEGATIVE Final     Comment: (NOTE) SARS-CoV-2 target nucleic acids are NOT DETECTED. The SARS-CoV-2 RNA is generally detectable in upper and lower respiratory specimens during the acute phase of infection. Negative results do not preclude SARS-CoV-2 infection, do not rule out co-infections with other pathogens, and should not be used as the sole basis for treatment or other patient management decisions. Negative results must be combined with clinical observations, patient history, and epidemiological information. The expected result is Negative. Fact Sheet for Patients: SugarRoll.be Fact Sheet for Healthcare Providers: https://www.woods-mathews.com/ This test is not yet approved or cleared by the [place] FDA and  has been authorized for detection and/or diagnosis of SARS-CoV-2 by FDA under an Emergency Use Authorization (EUA). This EUA will remain  in effect (meaning this test can be used) for the duration of the COVID-19 declaration under Section 56 4(b)(1) of the Act, 21 U.S.C. section 360bbb-3(b)(1), unless the authorization is terminated or revoked sooner. Performed at [facility], [place],  [place] [address]   Culture, blood (Routine X 2) w Reflex to ID Panel     Status: Abnormal   Collection Time: 04/20/19  5:37 PM   Specimen: BLOOD RIGHT HAND  Result Value Ref Range Status   Specimen Description BLOOD RIGHT HAND  Final   Special Requests   Final    BOTTLES DRAWN AEROBIC AND ANAEROBIC Blood Culture adequate volume   Culture  Setup Time   Final    GRAM POSITIVE COCCI AEROBIC BOTTLE ONLY CRITICAL RESULT CALLED TO, READ BACK BY AND VERIFIED WITH: [place] [id number] AT 1024 BY CM Performed at [facility], [place] [address], [place], [place] [address]    Culture STAPHYLOCOCCUS AUREUS (A)  Final   Report Status 04/23/2019 FINAL  Final   Organism ID, Bacteria STAPHYLOCOCCUS AUREUS  Final      Susceptibility    Staphylococcus aureus - MIC*    CIPROFLOXACIN <=0.5 SENSITIVE Sensitive     ERYTHROMYCIN <=0.25 SENSITIVE Sensitive     GENTAMICIN <=0.5 SENSITIVE Sensitive     OXACILLIN 0.5 SENSITIVE Sensitive     TETRACYCLINE <=1 SENSITIVE Sensitive     VANCOMYCIN <=0.5 SENSITIVE Sensitive     TRIMETH/SULFA <=10 SENSITIVE Sensitive     CLINDAMYCIN <=0.25 SENSITIVE Sensitive     RIFAMPIN <=0.5 SENSITIVE Sensitive     Inducible Clindamycin NEGATIVE Sensitive     * STAPHYLOCOCCUS AUREUS  MRSA PCR Screening     Status: None   Collection Time: 04/21/19  4:25 AM   Specimen: Nasal Mucosa; Nasopharyngeal  Result Value Ref Range Status   MRSA by PCR NEGATIVE NEGATIVE Final    Comment:        The GeneXpert MRSA Assay (FDA approved for NASAL specimens only), is one component of a comprehensive MRSA colonization surveillance program. It is not intended to diagnose MRSA infection nor to guide or monitor treatment for MRSA infections. Performed at [facility], [place] [address], [place], [place] [address]   Culture, blood (routine x 2)     Status: None (Preliminary result)   Collection Time: 04/22/19  4:30 PM   Specimen: BLOOD LEFT ARM  Result Value Ref Range Status   Specimen Description BLOOD LEFT ARM  Final   Special Requests   Final    BOTTLES DRAWN AEROBIC AND ANAEROBIC Blood Culture adequate volume   Culture   Final    NO GROWTH 2 DAYS Performed at [facility], [place] [address], [place], [place] [address]    Report Status PENDING  Incomplete  Culture, blood (routine x 2)     Status: None (Preliminary result)   Collection Time: 04/22/19  4:35 PM   Specimen: BLOOD LEFT ARM  Result Value Ref Range Status   Specimen Description BLOOD LEFT ARM  Final   Special Requests   Final    BOTTLES DRAWN AEROBIC AND ANAEROBIC Blood Culture adequate volume   Culture   Final    NO GROWTH 2 DAYS Performed at [facility], [place] [address] [place], [place] [address]    Report Status PENDING   Incomplete         Radiology Studies: No results found.      Scheduled Meds: . aspirin  81 mg Oral Daily  . carvedilol  3.125 mg Oral BID WC  . enoxaparin (LOVENOX) injection  40 mg Subcutaneous Q24H  . levothyroxine  50 mcg Oral QAC breakfast  . simvastatin  20 mg Oral QHS  .  sodium chloride flush  3 mL Intravenous Q12H  . tamsulosin  0.4 mg Oral QHS   Continuous Infusions: .  ceFAZolin (ANCEF) IV 2 g (04/24/19 0551)     LOS: 4 days    Time spent: 50min    Domenic Polite, MD [facility]  04/24/2019, 1:04 PM

## 2019-04-25 ENCOUNTER — Inpatient Hospital Stay: Payer: Self-pay

## 2019-04-25 ENCOUNTER — Inpatient Hospital Stay (HOSPITAL_COMMUNITY): Payer: Medicare Other

## 2019-04-25 DIAGNOSIS — Z96652 Presence of left artificial knee joint: Secondary | ICD-10-CM

## 2019-04-25 DIAGNOSIS — B9561 Methicillin susceptible Staphylococcus aureus infection as the cause of diseases classified elsewhere: Secondary | ICD-10-CM

## 2019-04-25 DIAGNOSIS — R7881 Bacteremia: Secondary | ICD-10-CM

## 2019-04-25 DIAGNOSIS — I700 Atherosclerosis of aorta: Secondary | ICD-10-CM

## 2019-04-25 LAB — CULTURE, BLOOD (ROUTINE X 2)
Culture: NO GROWTH
Special Requests: ADEQUATE

## 2019-04-25 NOTE — Progress Notes (Signed)
Occupational Therapy Treatment Patient Details Name: Zachary Decker MRN: [id number] DOB: 1932/10/03 Today's Date: 04/25/2019    History of present illness Pt is an 85 y/o male admitted secondary to bilateral LE cellulitis and inability to ambulate. PMH including but not limited to MI, CAD s/p CABG.   OT comments  Pt making minimal progress towards OT goals this session. Session focus on functional mobility as precursor to higher level ADLs.  Pt with inability to advance BLEs to recliner without assist. Pt presents with heavy posterior and left lateral lean needing MAX A +2 to complete safe stand pivot transfer to recliner. Continue to recommend SNF level therapies due to level of assist pt currently needing but feel family will decline and insist on DC home. Will continue to follow per POC and assist with family education on pts current level of function.    Follow Up Recommendations  SNF;Home health OT;Supervision/Assistance - 24 hour    Equipment Recommendations  Hospital bed    Recommendations for Other Services      Precautions / Restrictions Precautions Precautions: Fall Restrictions Weight Bearing Restrictions: No       Mobility Bed Mobility Overal bed mobility: Needs Assistance Bed Mobility: Supine to Sit     Supine to sit: HOB elevated     General bed mobility comments: MODA to scoot hips forward with use of bed pad; heavy use of bed rails and elevated HOB  Transfers Overall transfer level: Needs assistance Equipment used: Rolling walker (2 wheeled) Transfers: Sit to/from [facility] Sit to Stand: Max assist;+2 physical assistance Stand pivot transfers: Max assist;+2 physical assistance       General transfer comment: x2 trials sit>stand; MAX A +2 due to heavy posterior lean; MAX A +2 for pt to initiate pivotal steps to recliner    Balance Overall balance assessment: Needs assistance Sitting-balance support: Feet supported Sitting  balance-Leahy Scale: Poor Sitting balance - Comments: L lat lean Postural control: Posterior lean;Left lateral lean Standing balance support: Bilateral upper extremity supported Standing balance-Leahy Scale: Poor Standing balance comment: reliant on BUE support and external support                           ADL either performed or assessed with clinical judgement   ADL Overall ADL's : Needs assistance/impaired                     Lower Body Dressing: Total assistance;Sitting/lateral leans Lower Body Dressing Details (indicate cue type and reason): to don socks and shoes at EOB Toilet Transfer: Maximal assistance;+2 for physical assistance;Stand-pivot Toilet Transfer Details (indicate cue type and reason): simulated to recliner; MAX A +2 sit>stand ( heavy posterior lean) needs assist to advance BLEs to initiate pivotal steps         Functional mobility during ADLs: Maximal assistance;+2 for physical assistance General ADL Comments: session focus on simulated toilet transfer as family requesting pt to DC home, pt requires MAX A + 2 to stand and has difficulty initiating pivotal steps to recliner, required assist from therapist to advance BLEs during stand pivot     Vision       Perception     Praxis      Cognition Arousal/Alertness: Awake/alert Behavior During Therapy: Flat affect;WFL for tasks assessed/performed Overall Cognitive Status: History of cognitive impairments - at baseline Area of Impairment: Attention;Memory;Following commands;Safety/judgement;Awareness;Problem solving  Memory: Decreased short-term memory Following Commands: Follows one step commands with increased time Safety/Judgement: Decreased awareness of safety;Decreased awareness of deficits Awareness: Emergent Problem Solving: Slow processing;Decreased initiation;Difficulty sequencing;Requires verbal cues;Requires tactile cues General Comments: is unable to  self correct movements, unaware of posterior lean, slow to initiate movements, insistent that he can go home despite education on current level of assist pt is needing        Exercises     Shoulder Instructions       General Comments education on level of assist pt currently needing with pt insistent that family can manage at home    Pertinent Vitals/ Pain       Pain Assessment: No/denies pain  Home Living                                          Prior Functioning/Environment              Frequency  Min 3X/week        Progress Toward Goals  OT Goals(current goals can now be found in the care plan section)  Progress towards OT goals: Progressing toward goals  Acute Rehab OT Goals Patient Stated Goal: to walk OT Goal Formulation: With patient Time For Goal Achievement: 05/07/19 Potential to Achieve Goals: Good  Plan Discharge plan remains appropriate    Co-evaluation                 AM-PAC OT "6 Clicks" Daily Activity     Outcome Measure   Help from another person eating meals?: A Little Help from another person taking care of personal grooming?: A Lot Help from another person toileting, which includes using toliet, bedpan, or urinal?: Total Help from another person bathing (including washing, rinsing, drying)?: A Lot Help from another person to put on and taking off regular upper body clothing?: A Lot Help from another person to put on and taking off regular lower body clothing?: Total 6 Click Score: 11    End of Session Equipment Utilized During Treatment: Gait belt;Rolling walker  OT Visit Diagnosis: Unsteadiness on feet (R26.81);Other abnormalities of gait and mobility (R26.89);Muscle weakness (generalized) (M62.81);Other symptoms and signs involving cognitive function;Pain Pain - Right/Left: Right Pain - part of body: Leg   Activity Tolerance Patient tolerated treatment well   Patient Left in chair;with call bell/phone  within reach;with chair alarm set;Other (comment)   Nurse Communication Mobility status;Other (comment)        [id number] OT Time Calculation (min): 21 min  Charges: OT General Charges $OT Visit: 1 Visit OT Treatments $Therapeutic Activity: 8-22 mins  Lanier Clam., COTA/L Acute Rehabilitation Services [phone] [place] 04/25/2019, 3:46 PM

## 2019-04-25 NOTE — Care Management Important Message (Signed)
Important Message  Patient Details  Name: Zachary Decker MRN: [id number] Date of Birth: Nov 02, 1932   Medicare Important Message Given:  Yes     Memory Argue 04/25/2019, 3:51 PM

## 2019-04-25 NOTE — Progress Notes (Addendum)
[facility]  Date of Admission:  04/20/2019      Total days of antibiotics 6   Day 4 cefazolin            ASSESSMENT: Zachary Decker is a 85 y.o. male with MSSA bacteremia in the setting of RLE cellulitis of the right leg with concern for acute osteomyelitis of the right #2 and or #3 toes. Will order xray to evaluate for any destructive changes. If it looks as if he has osteomyelitis would ask orthopedics to see him for surgical planning. TTE negative for any large vegetations, mildly reduced EF (45-50%) and moderate calcifications involving aortic valve.     He has a h/o L TKR that is cool and unaffected presently.   OK to place PICC line for home use - discussed with Dr. Broadus John and the patient. He has a support at home that can assist with these infusions. He is hopeful to go home tomorrow. Will see what the xray shows.     PLAN: 1. Continue cefazolin  2. OK to place PICC line 3. Will order plain film of R foot 4. CRP / ESR in AM for baseline     Principal Problem:   MSSA bacteremia Active Problems:   Cellulitis of right leg   Hypothyroidism   HYPERCHOLESTEROLEMIA   Dehydration   Generalized weakness   . aspirin  81 mg Oral Daily  . carvedilol  3.125 mg Oral BID WC  . enoxaparin (LOVENOX) injection  40 mg Subcutaneous Q24H  . levothyroxine  50 mcg Oral QAC breakfast  . simvastatin  20 mg Oral QHS  . sodium chloride flush  3 mL Intravenous Q12H  . tamsulosin  0.4 mg Oral QHS    SUBJECTIVE: Feeling better every day. Still not with great appetite that he attributes to antibiotics. No nausea/vomiting or rashes.    Review of Systems: Review of Systems  Constitutional: Negative for chills, diaphoresis, fever and malaise/fatigue.  Respiratory: Negative for cough.   Cardiovascular: Negative for chest pain.  Gastrointestinal: Negative for abdominal pain, nausea and vomiting.       Anorexia  Genitourinary: Negative for  dysuria.  Musculoskeletal: Negative for back pain.  Neurological: Negative for dizziness and headaches.    Allergies  Allergen Reactions  . Losartan Potassium Other (See Comments)    Not known  . Naproxen Nausea And Vomiting  . Penicillins Rash    Did it involve swelling of the face/tongue/throat, SOB, or low BP? No Did it involve sudden or severe rash/hives, skin peeling, or any reaction on the inside of your mouth or nose? Yes Did you need to seek medical attention at a hospital or doctor's office? Unknown When did it last happen? If all above answers are "NO", may proceed with cephalosporin use.  TOLERATED CEFTRIAXONE 10/07/2018    OBJECTIVE: Vitals:   04/24/19 0735 04/24/19 2014 04/25/19 0543 04/25/19 0824  BP: 139/76 122/66 127/75 123/69  Pulse: (!) 57 (!) 59 (!) 58 (!) 58  Resp: 16   19  Temp: 98.4 F (36.9 C) 98.6 F (37 C) 98.2 F (36.8 C) 98.3 F (36.8 C)  TempSrc: Oral Oral Oral Oral  SpO2: 97% 97% 94% 94%  Weight:      Height:       Body mass index is 23.75 kg/m.  Physical Exam Constitutional:      Appearance: Normal appearance.     Comments: Resting quietly in bed.  Eyes:     Pupils: Pupils are equal, round, and reactive to light.  Cardiovascular:     Rate and Rhythm: Normal rate and regular rhythm.     Heart sounds: No murmur.  Pulmonary:     Effort: Pulmonary effort is normal.     Breath sounds: Normal breath sounds. No rhonchi.  Musculoskeletal: Normal range of motion.     Comments: Right #2 and 3 toes are painful still with light pressure. Blanched/white in appearance. R anterior shin with some chronic reddish changes; + pitting edema, cool to the touch, non-tender.   Skin:    General: Skin is warm and dry.     Capillary Refill: Capillary refill takes less than 2 seconds.  Neurological:     Mental Status: He is alert and oriented to person, place, and time.  Psychiatric:        Mood and Affect: Mood normal.     Lab Results Lab  Results  Component Value Date   WBC 10.7 (H) 04/24/2019   HGB 10.7 (L) 04/24/2019   HCT 31.8 (L) 04/24/2019   MCV 101.6 (H) 04/24/2019   PLT 253 04/24/2019    Lab Results  Component Value Date   CREATININE 1.14 04/23/2019   BUN 20 04/23/2019   NA 138 04/23/2019   K 4.7 04/23/2019   CL 106 04/23/2019   CO2 25 04/23/2019    Lab Results  Component Value Date   ALT 20 04/20/2019   AST 27 04/20/2019   ALKPHOS 45 04/20/2019   BILITOT 1.0 04/20/2019     Microbiology: Recent Results (from the past 240 hour(s))  Culture, blood (Routine X 2) w Reflex to ID Panel     Status: None   Collection Time: 04/20/19  4:45 PM   Specimen: Site Not Specified; Blood  Result Value Ref Range Status   Specimen Description SITE NOT SPECIFIED  Final   Special Requests   Final    BOTTLES DRAWN AEROBIC AND ANAEROBIC Blood Culture adequate volume   Culture   Final    NO GROWTH 5 DAYS Performed at [facility], [address], [place], [place] [address]    Report Status 04/25/2019 FINAL  Final  SARS CORONAVIRUS 2 (TAT 6-24 HRS) Nasopharyngeal Nasopharyngeal Swab     Status: None   Collection Time: 04/20/19  5:05 PM   Specimen: Nasopharyngeal Swab  Result Value Ref Range Status   SARS Coronavirus 2 NEGATIVE NEGATIVE Final    Comment: (NOTE) SARS-CoV-2 target nucleic acids are NOT DETECTED. The SARS-CoV-2 RNA is generally detectable in upper and lower respiratory specimens during the acute phase of infection. Negative results do not preclude SARS-CoV-2 infection, do not rule out co-infections with other pathogens, and should not be used as the sole basis for treatment or other patient management decisions. Negative results must be combined with clinical observations, patient history, and epidemiological information. The expected result is Negative. Fact Sheet for Patients: SugarRoll.be Fact Sheet for Healthcare Providers:  https://www.woods-mathews.com/ This test is not yet approved or cleared by the [place] FDA and  has been authorized for detection and/or diagnosis of SARS-CoV-2 by FDA under an Emergency Use Authorization (EUA). This EUA will remain  in effect (meaning this test can be used) for the duration of the COVID-19 declaration under Section 56 4(b)(1) of the Act, 21 U.S.C. section 360bbb-3(b)(1), unless the authorization is terminated or revoked sooner. Performed at [facility], [place] [address] [place], [place] [address]   Culture,  blood (Routine X 2) w Reflex to ID Panel     Status: Abnormal   Collection Time: 04/20/19  5:37 PM   Specimen: BLOOD RIGHT HAND  Result Value Ref Range Status   Specimen Description BLOOD RIGHT HAND  Final   Special Requests   Final    BOTTLES DRAWN AEROBIC AND ANAEROBIC Blood Culture adequate volume   Culture  Setup Time   Final    GRAM POSITIVE COCCI AEROBIC BOTTLE ONLY CRITICAL RESULT CALLED TO, READ BACK BY AND VERIFIED WITH: [place] [id number] AT 1024 BY CM Performed at [facility], [place] [address], [place], [place] [address]    Culture STAPHYLOCOCCUS AUREUS (A)  Final   Report Status 04/23/2019 FINAL  Final   Organism ID, Bacteria STAPHYLOCOCCUS AUREUS  Final      Susceptibility   Staphylococcus aureus - MIC*    CIPROFLOXACIN <=0.5 SENSITIVE Sensitive     ERYTHROMYCIN <=0.25 SENSITIVE Sensitive     GENTAMICIN <=0.5 SENSITIVE Sensitive     OXACILLIN 0.5 SENSITIVE Sensitive     TETRACYCLINE <=1 SENSITIVE Sensitive     VANCOMYCIN <=0.5 SENSITIVE Sensitive     TRIMETH/SULFA <=10 SENSITIVE Sensitive     CLINDAMYCIN <=0.25 SENSITIVE Sensitive     RIFAMPIN <=0.5 SENSITIVE Sensitive     Inducible Clindamycin NEGATIVE Sensitive     * STAPHYLOCOCCUS AUREUS  MRSA PCR Screening     Status: None   Collection Time: 04/21/19  4:25 AM   Specimen: Nasal Mucosa; Nasopharyngeal  Result Value Ref Range Status   MRSA by PCR  NEGATIVE NEGATIVE Final    Comment:        The GeneXpert MRSA Assay (FDA approved for NASAL specimens only), is one component of a comprehensive MRSA colonization surveillance program. It is not intended to diagnose MRSA infection nor to guide or monitor treatment for MRSA infections. Performed at [facility], [place] [address] [place], [place] [address]   Culture, blood (routine x 2)     Status: None (Preliminary result)   Collection Time: 04/22/19  4:30 PM   Specimen: BLOOD LEFT ARM  Result Value Ref Range Status   Specimen Description BLOOD LEFT ARM  Final   Special Requests   Final    BOTTLES DRAWN AEROBIC AND ANAEROBIC Blood Culture adequate volume   Culture   Final    NO GROWTH 3 DAYS Performed at [facility], [place] [address], [place], [place] [address]    Report Status PENDING  Incomplete  Culture, blood (routine x 2)     Status: None (Preliminary result)   Collection Time: 04/22/19  4:35 PM   Specimen: BLOOD LEFT ARM  Result Value Ref Range Status   Specimen Description BLOOD LEFT ARM  Final   Special Requests   Final    BOTTLES DRAWN AEROBIC AND ANAEROBIC Blood Culture adequate volume   Culture   Final    NO GROWTH 3 DAYS Performed at [facility], [place] [address] [place], [place] [address]    Report Status PENDING  Incomplete    Janene Madeira, MSN, NP-C [facility] [email or web address] Pager: [phone] Office: [phone] [place]: [phone]

## 2019-04-25 NOTE — Progress Notes (Signed)
PROGRESS NOTE    Zachary Decker  [id number] DOB: [birth year] DOA: 04/20/2019 PCP: Zachary Olp, MD  Brief Narrative: 85 year old male with history of hypothyroidism, history of left leg cellulitis in April presented to the emergency room with worsening pain discomfort in his right leg associated with difficulty in ambulating, in the emergency room he was noted to have significant leukocytosis with white count of 31K  Assessment & Plan:   Sepsis, MSSA Bacteremia, Right lower extremity cellulitis -1 out of 2 blood cultures with MSSA, was on IV ceftriaxone now on IV Ancef, day 6 of Abx so far -Clinically improving slowly, white count down from 31K on admission to 10K now -Repeat blood cultures from 10/30 negative x3days -Appreciate infectious disease input, 2D echocardiogram with normal EF and no vegetations -Continue right leg elevation, -PT eval completed, SNF recommended, family and patient choose to go home with home health services instead -ID to FU with Abx recommendations today -discharge planning  Acute kidney injury -Due to above sepsis, resolved  Generalized weakness, gait imbalance at home.   --secondary to above, out of bed, physical therapy -suspect movement disorder at baseline -d/w daughter recommended Outpatient Neurology consult  Symmetric dilatation of lateral ventricles.  -This is unchanged from prior CT head imaging, cannot rule out NPH,  -outpatient Neuro FU -Physical therapy evaluation completed  Hypothyroidism --Continue Synthroid  Hyperlipidemia --Continue statin  CKD 3 -Baseline creatinine around 1.3, this is stable  DVT prophylaxis: enoxaparin Code Status: Full per patient Family Communication:  No family at bedside, called and updated daughter Zachary Decker 11/1 Disposition: home tomorrow if stable     Subjective: -feels better overall, no events overnight, sat up in recliner yesterday, using his leg a bit  Objective: Vitals:    04/24/19 0735 04/24/19 2014 04/25/19 0543 04/25/19 0824  BP: 139/76 122/66 127/75 123/69  Pulse: (!) 57 (!) 59 (!) 58 (!) 58  Resp: 16   19  Temp: 98.4 F (36.9 C) 98.6 F (37 C) 98.2 F (36.8 C) 98.3 F (36.8 C)  TempSrc: Oral Oral Oral Oral  SpO2: 97% 97% 94% 94%  Weight:      Height:        Intake/Output Summary (Last 24 hours) at 04/25/2019 1309 Last data filed at 04/25/2019 1100 Gross per 24 hour  Intake 240 ml  Output 1250 ml  Net -1010 ml   Filed Weights   04/20/19 1645  Weight: 83.9 kg    Examination:  Gen: Awake, Alert, Oriented X2, elderly frail, chronically ill appearing, no distress HEENT: PERRLA, Neck supple, no JVD Lungs: Good air movement bilaterally, CTAB CVS: RRR,No Gallops,Rubs or new Murmurs Abd: soft, Non tender, non distended, BS present Extremities: improving erythema and tenderness of R leg Skin: As above Psychiatry: Flat affect    Data Reviewed:   CBC: Recent Labs  Lab 04/20/19 1237 04/21/19 0316 04/22/19 0455 04/23/19 0331 04/24/19 0346  WBC 31.9* 23.3* 16.4* 12.7* 10.7*  NEUTROABS 29.0*  --   --   --   --   HGB 11.6* 10.7* 10.4* 10.9* 10.7*  HCT 34.8* 32.2* 30.9* 32.9* 31.8*  MCV 102.7* 103.9* 102.3* 102.5* 101.6*  PLT 256 244 217 234 [id number]   Basic Metabolic Panel: Recent Labs  Lab 04/20/19 1237 04/21/19 0316 04/22/19 0455 04/23/19 0331  NA 135 136 139 138  K 4.4 4.4 4.3 4.7  CL 105 106 108 106  CO2 20* 20* 23 25  GLUCOSE 128* 98 105* 100*  BUN 32* 31*  24* 20  CREATININE 1.47* 1.27* 1.12 1.14  CALCIUM 8.8* 8.3* 8.4* 8.6*   GFR: Estimated Creatinine Clearance: 57.1 mL/min (by C-G formula based on SCr of 1.14 mg/dL). Liver Function Tests: Recent Labs  Lab 04/20/19 1237  AST 27  ALT 20  ALKPHOS 45  BILITOT 1.0  PROT 6.5  ALBUMIN 3.4*   No results for input(s): LIPASE, AMYLASE in the last 168 hours. No results for input(s): AMMONIA in the last 168 hours. Coagulation Profile: No results for input(s): INR,  PROTIME in the last 168 hours. Cardiac Enzymes: No results for input(s): CKTOTAL, CKMB, CKMBINDEX, TROPONINI in the last 168 hours. BNP (last 3 results) No results for input(s): PROBNP in the last 8760 hours. HbA1C: No results for input(s): HGBA1C in the last 72 hours. CBG: No results for input(s): GLUCAP in the last 168 hours. Lipid Profile: No results for input(s): CHOL, HDL, LDLCALC, TRIG, CHOLHDL, LDLDIRECT in the last 72 hours. Thyroid Function Tests: No results for input(s): TSH, T4TOTAL, FREET4, T3FREE, THYROIDAB in the last 72 hours. Anemia Panel: No results for input(s): VITAMINB12, FOLATE, FERRITIN, TIBC, IRON, RETICCTPCT in the last 72 hours. Urine analysis:    Component Value Date/Time   COLORURINE YELLOW 10/07/2018 1700   APPEARANCEUR CLEAR 10/07/2018 1700   LABSPEC 1.025 10/07/2018 1700   PHURINE 5.0 10/07/2018 1700   GLUCOSEU NEGATIVE 10/07/2018 1700   HGBUR NEGATIVE 10/07/2018 1700   BILIRUBINUR NEGATIVE 10/07/2018 1700   BILIRUBINUR n 08/10/2012 1612   KETONESUR NEGATIVE 10/07/2018 1700   PROTEINUR NEGATIVE 10/07/2018 1700   UROBILINOGEN 0.2 08/10/2012 1612   UROBILINOGEN 0.2 02/24/2012 0133   NITRITE NEGATIVE 10/07/2018 1700   LEUKOCYTESUR NEGATIVE 10/07/2018 1700   Sepsis Labs: @LABRCNTIP (procalcitonin:4,lacticidven:4)  ) Recent Results (from the past 240 hour(s))  Culture, blood (Routine X 2) w Reflex to ID Panel     Status: None   Collection Time: 04/20/19  4:45 PM   Specimen: Site Not Specified; Blood  Result Value Ref Range Status   Specimen Description SITE NOT SPECIFIED  Final   Special Requests   Final    BOTTLES DRAWN AEROBIC AND ANAEROBIC Blood Culture adequate volume   Culture   Final    NO GROWTH 5 DAYS Performed at [facility], [place] [address], [place], [place] [address]    Report Status 04/25/2019 FINAL  Final  SARS CORONAVIRUS 2 (TAT 6-24 HRS) Nasopharyngeal Nasopharyngeal Swab     Status: None   Collection Time: 04/20/19  5:05  PM   Specimen: Nasopharyngeal Swab  Result Value Ref Range Status   SARS Coronavirus 2 NEGATIVE NEGATIVE Final    Comment: (NOTE) SARS-CoV-2 target nucleic acids are NOT DETECTED. The SARS-CoV-2 RNA is generally detectable in upper and lower respiratory specimens during the acute phase of infection. Negative results do not preclude SARS-CoV-2 infection, do not rule out co-infections with other pathogens, and should not be used as the sole basis for treatment or other patient management decisions. Negative results must be combined with clinical observations, patient history, and epidemiological information. The expected result is Negative. Fact Sheet for Patients: SugarRoll.be Fact Sheet for Healthcare Providers: https://www.woods-mathews.com/ This test is not yet approved or cleared by the [place] FDA and  has been authorized for detection and/or diagnosis of SARS-CoV-2 by FDA under an Emergency Use Authorization (EUA). This EUA will remain  in effect (meaning this test can be used) for the duration of the COVID-19 declaration under Section 56 4(b)(1) of the Act, 21 U.S.C. section  360bbb-3(b)(1), unless the authorization is terminated or revoked sooner. Performed at [facility], [place] [address], [place], [place] [address]   Culture, blood (Routine X 2) w Reflex to ID Panel     Status: Abnormal   Collection Time: 04/20/19  5:37 PM   Specimen: BLOOD RIGHT HAND  Result Value Ref Range Status   Specimen Description BLOOD RIGHT HAND  Final   Special Requests   Final    BOTTLES DRAWN AEROBIC AND ANAEROBIC Blood Culture adequate volume   Culture  Setup Time   Final    GRAM POSITIVE COCCI AEROBIC BOTTLE ONLY CRITICAL RESULT CALLED TO, READ BACK BY AND VERIFIED WITH: [place] [id number] AT 1024 BY CM Performed at [facility], [place] [address], [place], [place] [address]    Culture STAPHYLOCOCCUS AUREUS (A)  Final    Report Status 04/23/2019 FINAL  Final   Organism ID, Bacteria STAPHYLOCOCCUS AUREUS  Final      Susceptibility   Staphylococcus aureus - MIC*    CIPROFLOXACIN <=0.5 SENSITIVE Sensitive     ERYTHROMYCIN <=0.25 SENSITIVE Sensitive     GENTAMICIN <=0.5 SENSITIVE Sensitive     OXACILLIN 0.5 SENSITIVE Sensitive     TETRACYCLINE <=1 SENSITIVE Sensitive     VANCOMYCIN <=0.5 SENSITIVE Sensitive     TRIMETH/SULFA <=10 SENSITIVE Sensitive     CLINDAMYCIN <=0.25 SENSITIVE Sensitive     RIFAMPIN <=0.5 SENSITIVE Sensitive     Inducible Clindamycin NEGATIVE Sensitive     * STAPHYLOCOCCUS AUREUS  MRSA PCR Screening     Status: None   Collection Time: 04/21/19  4:25 AM   Specimen: Nasal Mucosa; Nasopharyngeal  Result Value Ref Range Status   MRSA by PCR NEGATIVE NEGATIVE Final    Comment:        The GeneXpert MRSA Assay (FDA approved for NASAL specimens only), is one component of a comprehensive MRSA colonization surveillance program. It is not intended to diagnose MRSA infection nor to guide or monitor treatment for MRSA infections. Performed at [facility], [place] [address], [place], [place] [address]   Culture, blood (routine x 2)     Status: None (Preliminary result)   Collection Time: 04/22/19  4:30 PM   Specimen: BLOOD LEFT ARM  Result Value Ref Range Status   Specimen Description BLOOD LEFT ARM  Final   Special Requests   Final    BOTTLES DRAWN AEROBIC AND ANAEROBIC Blood Culture adequate volume   Culture   Final    NO GROWTH 3 DAYS Performed at [facility], [place] [address], [place], [place] [address]    Report Status PENDING  Incomplete  Culture, blood (routine x 2)     Status: None (Preliminary result)   Collection Time: 04/22/19  4:35 PM   Specimen: BLOOD LEFT ARM  Result Value Ref Range Status   Specimen Description BLOOD LEFT ARM  Final   Special Requests   Final    BOTTLES DRAWN AEROBIC AND ANAEROBIC Blood Culture adequate volume   Culture   Final    NO  GROWTH 3 DAYS Performed at [facility], [place] [address] [place], [place] [address]    Report Status PENDING  Incomplete         Radiology Studies: No results found.      Scheduled Meds: . aspirin  81 mg Oral Daily  . carvedilol  3.125 mg Oral BID WC  . enoxaparin (LOVENOX) injection  40 mg Subcutaneous  Q24H  . levothyroxine  50 mcg Oral QAC breakfast  . simvastatin  20 mg Oral QHS  . sodium chloride flush  3 mL Intravenous Q12H  . tamsulosin  0.4 mg Oral QHS   Continuous Infusions: .  ceFAZolin (ANCEF) IV 2 g (04/25/19 0558)     LOS: 5 days    Time spent: 64min    Domenic Polite, MD [facility]  04/25/2019, 1:09 PM

## 2019-04-26 LAB — CBC
HCT: 31.1 % — ABNORMAL LOW (ref 39.0–52.0)
Hemoglobin: 10.4 g/dL — ABNORMAL LOW (ref 13.0–17.0)
MCH: 34.3 pg — ABNORMAL HIGH (ref 26.0–34.0)
MCHC: 33.4 g/dL (ref 30.0–36.0)
MCV: 102.6 fL — ABNORMAL HIGH (ref 80.0–100.0)
Platelets: 308 10*3/uL (ref 150–400)
RBC: 3.03 MIL/uL — ABNORMAL LOW (ref 4.22–5.81)
RDW: 13 % (ref 11.5–15.5)
WBC: 10.6 10*3/uL — ABNORMAL HIGH (ref 4.0–10.5)
nRBC: 0 % (ref 0.0–0.2)

## 2019-04-26 LAB — SEDIMENTATION RATE: Sed Rate: 27 mm/hr — ABNORMAL HIGH (ref 0–16)

## 2019-04-26 LAB — BASIC METABOLIC PANEL
Anion gap: 11 (ref 5–15)
BUN: 20 mg/dL (ref 8–23)
CO2: 26 mmol/L (ref 22–32)
Calcium: 8.4 mg/dL — ABNORMAL LOW (ref 8.9–10.3)
Chloride: 101 mmol/L (ref 98–111)
Creatinine, Ser: 1.07 mg/dL (ref 0.61–1.24)
GFR calc Af Amer: >60 mL/min (ref >60)
GFR calc non Af Amer: >60 mL/min (ref >60)
Glucose, Bld: 88 mg/dL (ref 70–99)
Potassium: 4 mmol/L (ref 3.5–5.1)
Sodium: 138 mmol/L (ref 135–145)

## 2019-04-26 LAB — C-REACTIVE PROTEIN: CRP: 3.8 mg/dL — ABNORMAL HIGH (ref <1.0)

## 2019-04-26 MED ORDER — POLYETHYLENE GLYCOL 3350 17 G PO PACK: 17.0000 g | PACK | Freq: Every day | ORAL | 0 refills | Status: DC | PRN

## 2019-04-26 MED ORDER — CEFAZOLIN IV (FOR PTA / DISCHARGE USE ONLY): 2.0000 g | Freq: Three times a day (TID) | INTRAVENOUS | 0 refills | Status: DC

## 2019-04-26 MED ORDER — SODIUM CHLORIDE 0.9% FLUSH
10.0000 mL | INTRAVENOUS | Status: DC | PRN
  Administered 2019-04-27: 10 mL
  Filled 2019-04-26: qty 40

## 2019-04-26 MED ORDER — SODIUM CHLORIDE 0.9% FLUSH
10.0000 mL | Freq: Two times a day (BID) | INTRAVENOUS | Status: DC
  Administered 2019-04-26 – 2019-04-30 (×6): 10 mL

## 2019-04-26 NOTE — Progress Notes (Signed)
Physical Therapy Treatment Patient Details Name: Zachary Decker MRN: [id number] DOB: 06-24-1932 Today's Date: 04/26/2019    History of Present Illness Pt is an 85 y/o male admitted secondary to bilateral LE cellulitis and inability to ambulate. PMH including but not limited to MI, CAD s/p CABG.    PT Comments     Pt seen for functional mobility progression.  Pt continues to require Max A +2 for transfers and progression of LE for side stepping 2 ft at EOB.  Pt has made progress with bed mobility; able to progress LE to EOB with Min A. Continues with poor sitting/ standing balance with heavy posterior lean, and poor awareness of safety and deficits.  Continue to recommend SNF level therapies due to level of assist pt currently needing. Will continue to follow acutely.    Follow Up Recommendations  SNF     Equipment Recommendations  None recommended by PT    Recommendations for Other Services       Precautions / Restrictions Precautions Precautions: Fall    Mobility  Bed Mobility Overal bed mobility: Needs Assistance Bed Mobility: Supine to Sit     Supine to sit: HOB elevated;Modified independent (Device/Increase time)     General bed mobility comments: Increased time with heavy use of hand rails for trunk elevation; Min A  with management of LE to EOB; Max +2 to scoot to EOB with use of pad  Transfers Overall transfer level: Needs assistance Equipment used: Rolling walker (2 wheeled) Transfers: Sit to/from Stand;Lateral/Scoot Transfers Sit to Stand: Max assist;+2 physical assistance;From elevated surface        Lateral/Scoot Transfers: Max assist;+2 physical assistance General transfer comment: Max A +2, Sit to/from stand with RW x 4, heavy posterior lateral left lean, with posterior LE leaning into bed; Min A +2 for safety with sit to stand with stedy; Miin A +2 for control descent to recliner  Ambulation/Gait Ambulation/Gait assistance: Max assist;+2 physical  assistance Gait Distance (Feet): 2 Feet Assistive device: Rolling walker (2 wheeled) Gait Pattern/deviations: Leaning posteriorly;Trunk flexed;Narrow base of support;Decreased weight shift to right;Shuffle;Decreased step length - left;Decreased stance time - right     General Gait Details: Attempted side stepping towards [facility] Max A +2 for step progression, RW management, and standing balance.      Balance Overall balance assessment: Needs assistance Sitting-balance support: Bilateral upper extremity supported;Feet supported Sitting balance-Leahy Scale: Poor Sitting balance - Comments: L lateral lean; heavy use of Rt hand on rail Postural control: Posterior lean;Left lateral lean Standing balance support: Bilateral upper extremity supported Standing balance-Leahy Scale: Poor Standing balance comment: reliant on BUE support and external support                            Cognition Arousal/Alertness: Lethargic Behavior During Therapy: Flat affect;WFL for tasks assessed/performed Overall Cognitive Status: History of cognitive impairments - at baseline Area of Impairment: Attention;Memory;Following commands;Safety/judgement;Awareness;Problem solving                   Current Attention Level: Sustained Memory: Decreased short-term memory Following Commands: Follows one step commands with increased time Safety/Judgement: Decreased awareness of safety;Decreased awareness of deficits Awareness: Emergent Problem Solving: Slow processing;Decreased initiation;Difficulty sequencing;Requires verbal cues;Requires tactile cues General Comments: is unable to self correct movements, unaware of posterior lean, slow to initiate movements, insistent that he can go home despite education on current level of assist pt is needing      Exercises  General Exercises - Upper Extremity Shoulder Flexion: AROM;Left;Right;5 reps General Exercises - Lower Extremity Ankle Circles/Pumps:  AROM;20 reps;Both Long Arc Quad: AROM;Right;Left;5 reps Heel Slides: AROM;Right;Left;5 reps    General Comments        Pertinent Vitals/Pain Pain Assessment: No/denies pain    Home Living                      Prior Function            PT Goals (current goals can now be found in the care plan section)      Frequency    Min 2X/week      PT Plan Current plan remains appropriate    Co-evaluation              AM-PAC PT "6 Clicks" Mobility   Outcome Measure  Help needed turning from your back to your side while in a flat bed without using bedrails?: Total Help needed moving from lying on your back to sitting on the side of a flat bed without using bedrails?: Total Help needed moving to and from a bed to a chair (including a wheelchair)?: Total Help needed standing up from a chair using your arms (e.g., wheelchair or bedside chair)?: Total Help needed to walk in hospital room?: Total Help needed climbing 3-5 steps with a railing? : Total 6 Click Score: 6    End of Session Equipment Utilized During Treatment: Gait belt Activity Tolerance: Patient limited by fatigue Patient left: in chair;with call bell/phone within reach;with chair alarm set Nurse Communication: Mobility status;Need for lift equipment PT Visit Diagnosis: Other abnormalities of gait and mobility (R26.89);Muscle weakness (generalized) (M62.81)     Time: [id number] PT Time Calculation (min) (ACUTE ONLY): 33 min  Charges:  $Therapeutic Exercise: 8-22 mins $Therapeutic Activity: 8-22 mins                     This entire session was performed under direct supervision and direction of a licensed physical therapist assistant. I have personally read, edited and approved of the note as written.  Kerin Perna, PTA 04/26/19 9:52 AM  Gardiner Rhyme, SPTA   Kerin Perna, PTA 04/26/19 9:52 AM

## 2019-04-26 NOTE — Progress Notes (Signed)
[place] for Infectious Disease  Date of Admission:  04/20/2019      Total days of antibiotics 7   Day 5 cefazolin            ASSESSMENT: Zachary Decker is a 85 y.o. male with MSSA bacteremia in the setting of RLE cellulitis of the right leg with concern for acute osteomyelitis of the right #2 and or #3 toes. No xray findings of osteomyelitis on right foot.  TTE negative for any large vegetations, mildly reduced EF (45-50%) and moderate calcifications involving aortic valve. With 1/4 bottles + and alternative source decided to forego TEE.   He has a h/o L TKR that is cool and unaffected.     PLAN: 1. Continue cefazolin OPAT as directed   OPAT ORDERS:  Diagnosis: Bacteremia 2/2 LE cellulitis   Culture Result: MSSA  Allergies  Allergen Reactions  . Losartan Potassium Other (See Comments)    Not known  . Naproxen Nausea And Vomiting  . Penicillins Rash    Did it involve swelling of the face/tongue/throat, SOB, or low BP? No Did it involve sudden or severe rash/hives, skin peeling, or any reaction on the inside of your mouth or nose? Yes Did you need to seek medical attention at a hospital or doctor's office? Unknown When did it last happen? If all above answers are "NO", may proceed with cephalosporin use.  TOLERATED CEFTRIAXONE 10/07/2018    Discharge antibiotics: Cefazolin IV 2gm Q8h  Duration: 2 weeks   End Date: Nov 12   [facility] Care and Maintenance Per Protocol _x_ Please pull PIC at completion of IV antibiotics __ Please leave PIC in place until doctor has seen patient or been notified  Labs weekly while on IV antibiotics: _x_ CBC with differential __ BMP __ BMP TWICE WEEKLY** _x_ CMP __ CRP __ ESR __ Vancomycin trough  Fax weekly labs to [phone]  Clinic Follow Up Appt: Primary care team     Principal Problem:   MSSA bacteremia Active Problems:   Cellulitis of right leg   Hypothyroidism   HYPERCHOLESTEROLEMIA    Dehydration   Generalized weakness   . aspirin  81 mg Oral Daily  . carvedilol  3.125 mg Oral BID WC  . enoxaparin (LOVENOX) injection  40 mg Subcutaneous Q24H  . levothyroxine  50 mcg Oral QAC breakfast  . simvastatin  20 mg Oral QHS  . sodium chloride flush  3 mL Intravenous Q12H  . tamsulosin  0.4 mg Oral QHS     Janene Madeira, MSN, NP-C [facility] Infectious Disease [email or web address] Pager: [phone] Office: [phone] [place]: [phone]

## 2019-04-26 NOTE — Progress Notes (Signed)
Occupational Therapy Treatment Patient Details Name: Zachary Decker MRN: [id number] DOB: 1933/02/01 Today's Date: 04/26/2019    History of present illness Pt is an 85 y/o male admitted secondary to bilateral LE cellulitis and inability to ambulate. PMH including but not limited to MI, CAD s/p CABG.   OT comments  Pt making steady progress towards OT goals this session. Session focus on functional sit>stands as precursor to higher level ADLs. Pts daughter present throughout session able to observe level of assist needed. Pt complete x4 sit>stands from EOB with RW. Pt required MAX A +2 d/t heavy posterior lean/ left lateral lean. Pts daughter aware of level of assist and feels they will be fine at home. Updated DC rec and DME needs as pt and family decline SNF placement and report DME needs are met. Will let OTR know about change in POC.    Follow Up Recommendations  Home health OT;Supervision/Assistance - 24 hour    Equipment Recommendations  Other (comment)(DME needs are met)    Recommendations for Other Services      Precautions / Restrictions Precautions Precautions: Fall Restrictions Weight Bearing Restrictions: No       Mobility Bed Mobility Overal bed mobility: Needs Assistance Bed Mobility: Supine to Sit;Sit to Supine     Supine to sit: HOB elevated;Min assist Sit to supine: HOB elevated;Mod assist   General bed mobility comments: MINA  to scoot hips to EOB, MOD A to elevate BLEs back to bed  Transfers Overall transfer level: Needs assistance Equipment used: Rolling walker (2 wheeled) Transfers: Sit to/from Stand Sit to Stand: Max assist;+2 physical assistance;From elevated surface         General transfer comment: Max A +2, Sit to/from stand with RW x 4, heavy posterior lateral left lean, Daughter present and able to observe how much assist is needed, pt requires MAX cues to self correct posterior/ left lateral lean, unable to correct with external assist     Balance Overall balance assessment: Needs assistance Sitting-balance support: Bilateral upper extremity supported;Feet supported Sitting balance-Leahy Scale: Poor   Postural control: Posterior lean;Left lateral lean Standing balance support: Bilateral upper extremity supported Standing balance-Leahy Scale: Poor Standing balance comment: reliant on BUE support and external support                           ADL either performed or assessed with clinical judgement   ADL Overall ADL's : Needs assistance/impaired                     Lower Body Dressing: Total assistance;Sitting/lateral leans Lower Body Dressing Details (indicate cue type and reason): to don socks and shoes at EOB Toilet Transfer: Maximal assistance;+2 for physical assistance;Stand-pivot Toilet Transfer Details (indicate cue type and reason): simulated to recliner; MAX A +2 sit>stand ( heavy posterior lean)         Functional mobility during ADLs: Maximal assistance;+2 for physical assistance General ADL Comments: session focus on sit>stand trials with daughter present to see how much assist pt is requiring, pt continues to beed MAX A +2 sit>stand with RW from EOB, daughter reports she feels like they can manage at home and have all needed DME     Vision       Perception     Praxis      Cognition Arousal/Alertness: Awake/alert Behavior During Therapy: Flat affect;WFL for tasks assessed/performed Overall Cognitive Status: History of cognitive impairments - at baseline Area  of Impairment: Attention;Following commands;Safety/judgement;Awareness;Problem solving                   Current Attention Level: Sustained   Following Commands: Follows one step commands with increased time Safety/Judgement: Decreased awareness of safety;Decreased awareness of deficits Awareness: Emergent Problem Solving: Slow processing;Decreased initiation;Difficulty sequencing;Requires verbal cues;Requires  tactile cues General Comments: unable to self correct movements, insistent that he will be fine at home, although was motivated throuhgout session to progress mobility        Exercises     Shoulder Instructions       General Comments daughter present throuhgout, assisted with sit>stand to see how much assist is needed, still insisting on home, feels they can manage    Pertinent Vitals/ Pain       Pain Assessment: No/denies pain  Home Living                                          Prior Functioning/Environment              Frequency  Min 3X/week        Progress Toward Goals  OT Goals(current goals can now be found in the care plan section)  Progress towards OT goals: Progressing toward goals  Acute Rehab OT Goals Patient Stated Goal: to walk OT Goal Formulation: With patient Time For Goal Achievement: 05/07/19 Potential to Achieve Goals: Good  Plan Discharge plan remains appropriate    Co-evaluation                 AM-PAC OT "6 Clicks" Daily Activity     Outcome Measure   Help from another person eating meals?: A Little Help from another person taking care of personal grooming?: A Lot Help from another person toileting, which includes using toliet, bedpan, or urinal?: Total Help from another person bathing (including washing, rinsing, drying)?: A Lot Help from another person to put on and taking off regular upper body clothing?: A Lot Help from another person to put on and taking off regular lower body clothing?: Total 6 Click Score: 11    End of Session Equipment Utilized During Treatment: Gait belt;Rolling walker  OT Visit Diagnosis: Unsteadiness on feet (R26.81);Other abnormalities of gait and mobility (R26.89);Muscle weakness (generalized) (M62.81);Other symptoms and signs involving cognitive function;Pain Pain - Right/Left: Right Pain - part of body: Leg   Activity Tolerance Patient tolerated treatment well   Patient  Left in bed;with call bell/phone within reach;with family/visitor present   Nurse Communication          Time: 1027-2536 OT Time Calculation (min): 34 min  Charges: OT General Charges $OT Visit: 1 Visit OT Treatments $Therapeutic Activity: 23-37 mins  Lanier Clam., COTA/L Acute Rehabilitation Services [phone] [place] 04/26/2019, 4:01 PM

## 2019-04-26 NOTE — Progress Notes (Signed)
PHARMACY CONSULT NOTE FOR:  OUTPATIENT  PARENTERAL ANTIBIOTIC THERAPY (OPAT)  Indication: bacteremia   Regimen: Cefazolin 2 grams iv Q 8 End date: 05/05/19  IV antibiotic discharge orders are pended. To discharging provider:  please sign these orders via discharge navigator,  Select New Orders & click on the button choice - Manage This Unsigned Work.     Thank you for allowing pharmacy to be a part of this patient's care.  Tad Moore 04/26/2019, 7:31 AM

## 2019-04-26 NOTE — TOC Progression Note (Signed)
Transition of Care [facility]) - Progression Note    Patient Details  Name: Zachary Decker MRN: [id number] Date of Birth: [birth year]  Transition of Care [facility]) CM/SW Contact  Atilano Median, LCSW Phone Number: 04/26/2019, 3:01 PM  Clinical Narrative:    Plan is to discharge home with home health services for IV abx and HHPT. [facility]. Pam will do the teaching today and patient will dc home tomorrow 11/4 after AM dose of IV abx.   Expected Discharge Plan: [place] Barriers to Discharge: Continued Medical Work up  Expected Discharge Plan and Services Expected Discharge Plan: [place] In-house Referral: Clinical Social Work     Living arrangements for the past 2 months: Single Family Home Expected Discharge Date: 04/26/19                         HH Arranged: PT [place]: [facility] Date [place]: 04/22/19 Time [place]: 45 Representative spoke with at [place]: [place] ([place]) Interventions    Readmission Risk Interventions Readmission Risk Prevention Plan 10/09/2018  Transportation Screening Complete  PCP or Specialist Appt within 5-7 Days Complete  Home Care Screening Complete  Medication Review (RN CM) Complete  Some recent data might be hidden

## 2019-04-27 ENCOUNTER — Encounter (HOSPITAL_COMMUNITY)
Admission: EM | Disposition: A | Discharge status: Home Health | Payer: Self-pay | Source: Home / Self Care | Attending: Internal Medicine

## 2019-04-27 ENCOUNTER — Encounter (HOSPITAL_COMMUNITY): Payer: Self-pay | Admitting: Anesthesiology

## 2019-04-27 ENCOUNTER — Other Ambulatory Visit: Payer: Self-pay | Admitting: Family Medicine

## 2019-04-27 DIAGNOSIS — D500 Iron deficiency anemia secondary to blood loss (chronic): Secondary | ICD-10-CM

## 2019-04-27 DIAGNOSIS — K922 Gastrointestinal hemorrhage, unspecified: Secondary | ICD-10-CM

## 2019-04-27 LAB — CULTURE, BLOOD (ROUTINE X 2)
Culture: NO GROWTH
Culture: NO GROWTH
Special Requests: ADEQUATE
Special Requests: ADEQUATE

## 2019-04-27 LAB — CBC
HCT: 28.7 % — ABNORMAL LOW (ref 39.0–52.0)
Hemoglobin: 9.8 g/dL — ABNORMAL LOW (ref 13.0–17.0)
MCH: 33 pg (ref 26.0–34.0)
MCHC: 34.1 g/dL (ref 30.0–36.0)
MCV: 96.6 fL (ref 80.0–100.0)
Platelets: 239 10*3/uL (ref 150–400)
RBC: 2.97 MIL/uL — ABNORMAL LOW (ref 4.22–5.81)
RDW: 16.1 % — ABNORMAL HIGH (ref 11.5–15.5)
WBC: 16.1 10*3/uL — ABNORMAL HIGH (ref 4.0–10.5)
nRBC: 0.1 % (ref 0.0–0.2)

## 2019-04-27 LAB — CREATININE, SERUM
Creatinine, Ser: 1.01 mg/dL (ref 0.61–1.24)
GFR calc Af Amer: >60 mL/min (ref >60)
GFR calc non Af Amer: >60 mL/min (ref >60)

## 2019-04-27 LAB — HEMOGLOBIN AND HEMATOCRIT, BLOOD
HCT: 22.6 % — ABNORMAL LOW (ref 39.0–52.0)
Hemoglobin: 7.3 g/dL — ABNORMAL LOW (ref 13.0–17.0)

## 2019-04-27 LAB — PREPARE RBC (CROSSMATCH)

## 2019-04-27 SURGERY — ESOPHAGOGASTRODUODENOSCOPY (EGD) WITH PROPOFOL
Anesthesia: Monitor Anesthesia Care

## 2019-04-27 MED ORDER — SODIUM CHLORIDE 0.9 % IV SOLN
8.0000 mg/h | INTRAVENOUS | Status: DC
  Administered 2019-04-27 – 2019-04-30 (×7): 8 mg/h via INTRAVENOUS
  Filled 2019-04-27 (×10): qty 80

## 2019-04-27 MED ORDER — PANTOPRAZOLE SODIUM 40 MG IV SOLR: 40.0000 mg | Freq: Two times a day (BID) | INTRAVENOUS | Status: DC

## 2019-04-27 MED ORDER — SODIUM CHLORIDE 0.9% IV SOLUTION
Freq: Once | INTRAVENOUS | Status: AC
  Administered 2019-04-27: 14:00:00 via INTRAVENOUS

## 2019-04-27 MED ORDER — SODIUM CHLORIDE 0.9 % IV BOLUS
1000.0000 mL | Freq: Once | INTRAVENOUS | Status: AC
  Administered 2019-04-27: 1000 mL via INTRAVENOUS

## 2019-04-27 MED ORDER — PANTOPRAZOLE SODIUM 40 MG PO TBEC
40.0000 mg | DELAYED_RELEASE_TABLET | Freq: Two times a day (BID) | ORAL | Status: DC
  Administered 2019-04-27: 40 mg via ORAL
  Filled 2019-04-27: qty 1

## 2019-04-27 MED ORDER — SODIUM CHLORIDE 0.9 % IV SOLN
80.0000 mg | Freq: Once | INTRAVENOUS | Status: AC
  Administered 2019-04-27: 80 mg via INTRAVENOUS
  Filled 2019-04-27: qty 80

## 2019-04-27 MED ORDER — SODIUM CHLORIDE 0.9% IV SOLUTION: Freq: Once | INTRAVENOUS | Status: DC

## 2019-04-27 MED ORDER — SODIUM CHLORIDE 0.9 % IV SOLN
INTRAVENOUS | Status: DC
  Administered 2019-04-27 – 2019-04-29 (×4): via INTRAVENOUS

## 2019-04-27 MED ORDER — METOCLOPRAMIDE HCL 5 MG/5ML PO SOLN
10.0000 mg | Freq: Once | ORAL | Status: AC
  Administered 2019-04-27: 10 mg via ORAL
  Filled 2019-04-27 (×2): qty 10

## 2019-04-27 NOTE — Discharge Summary (Signed)
Physician Discharge Summary  Zachary Decker JQG:[id number] DOB: [birth year] DOA: 04/20/2019  PCP: [name], MD  Admit date: 04/20/2019 Discharge date: 04/26/2019  Time spent: 35 minutes  Recommendations for Outpatient Follow-up:  1. PCP in 1 week 2. Continue IV Ancef via PICC line till 11/12 3. Home health PT/OT/RN 4. Please DC Picc line after Abx completed 5. Outpatient Neurology in 1 month for eval for movement disorder   Discharge Diagnoses:  Principal Problem:   MSSA bacteremia Active Problems:   Hypothyroidism   HYPERCHOLESTEROLEMIA   Cellulitis of right leg   Dehydration   Generalized weakness   Discharge Condition: stable  Diet recommendation: heart healthy  Filed Weights   04/20/19 1645  Weight: 83.9 kg    History of present illness:  85 year old male with history of hypothyroidism, history of left leg cellulitis in April presented to the emergency room with worsening pain discomfort in his right leg associated with difficulty in ambulating, in the emergency room he was noted to have significant leukocytosis with white count of 31K  Hospital Course:  Sepsis, MSSA Bacteremia, Right lower extremity cellulitis -1 out of 2 blood cultures with MSSA, was on IV ceftriaxone now on IV Ancef, day 6 of Abx so far -Clinically improving slowly, white count down from 31K on admission to 10K now, cellulitis is improving -Repeat blood cultures from 10/30 negative x3days -Infectious disease consulted , 2D echocardiogram with normal EF and no vegetations -Continue right leg elevation, -PT eval completed, SNF recommended, family and patient choose to go home with home health services instead -per ID recs he will discharge home on IV Ancef via PICC line till 11/12, home health RN to FU  Acute kidney injury -Due to above sepsis, resolved  Generalized weakness, gait imbalance at home. --secondary to above, out of bed, physical therapy -suspect movement disorder at  baseline -d/w daughter recommended Outpatient Neurology consult  Symmetric dilatation of lateral ventricles.  -This is unchanged from prior CT head imaging, cannot rule out NPH,  -outpatient Neuro FU -Physical therapy evaluation completed  Hypothyroidism --Continue Synthroid  Hyperlipidemia --Continue statin  CKD 3 -Baseline creatinine around 1.3, this is stable   Discharge Exam: Vitals:   04/26/19 1916 04/27/19 0325  BP: (!) 122/57 128/62  Pulse: 61 68  Resp: 18 16  Temp: (!) 97.3 F (36.3 C) 97.8 F (36.6 C)  SpO2: 95% 98%    General: stable Cardiovascular: S1S2/RR Respiratory: CTAB  Discharge Instructions   Discharge Instructions    Diet - low sodium heart healthy   Complete by: As directed    Home infusion instructions [facility] May follow [place] Dosing Protocol; May administer Cathflo as needed to maintain patency of vascular access device.; Flushing of vascular access device: per [facility] Protocol: 0.9% NaCl pre/post medica...   Complete by: As directed    Instructions: May follow [place] Dosing Protocol   Instructions: May administer Cathflo as needed to maintain patency of vascular access device.   Instructions: Flushing of vascular access device: per [facility] Protocol: 0.9% NaCl pre/post medication administration and prn patency; Heparin 100 u/ml, 11m for implanted ports and Heparin 10u/ml, 571mfor all other central venous catheters.   Instructions: May follow [facility] Anaphylaxis Protocol for First Dose Administration in the home: 0.9% NaCl at 25-50 ml/hr to maintain IV access for protocol meds. Epinephrine 0.3 ml IV/IM PRN and Benadryl 25-50 IV/IM PRN s/s of anaphylaxis.   Instructions: [facility] Coordinator (RN) to assist per patient  IV care needs in the home PRN.   Increase activity slowly   Complete by: As directed      Allergies as of 04/27/2019      Reactions   Losartan Potassium Other (See Comments)   Not known    Naproxen Nausea And Vomiting   Penicillins Rash   Did it involve swelling of the face/tongue/throat, SOB, or low BP? No Did it involve sudden or severe rash/hives, skin peeling, or any reaction on the inside of your mouth or nose? Yes Did you need to seek medical attention at a hospital or doctor's office? Unknown When did it last happen? If all above answers are "NO", may proceed with cephalosporin use. TOLERATED CEFTRIAXONE 10/07/2018      Medication List    STOP taking these medications   lisinopril 2.5 MG tablet Commonly known as: ZESTRIL     TAKE these medications   aspirin 81 MG tablet Take 81 mg by mouth daily.   carvedilol 3.125 MG tablet Commonly known as: COREG TAKE 1 TABLET BY MOUTH 2 TIMES A DAY WITH MEAL What changed:   how much to take  how to take this  when to take this  additional instructions   ceFAZolin  IVPB Commonly known as: ANCEF Inject 2 g into the vein every 8 (eight) hours. Indication: bacteremia Last Day of Therapy:  05/05/19 Labs - Once weekly:  CBC/D and BMP, Labs - Every other week:  ESR and CRP   Centrum Silver 50+Men Tabs Take 1 tablet by mouth daily.   diclofenac sodium 1 % Gel Commonly known as: VOLTAREN APPLY 2 GRAMS TO AFFECTED AREA 4 TIMES A DAY What changed: See the new instructions.   famotidine 20 MG tablet Commonly known as: PEPCID Take 20 mg by mouth as needed for heartburn or indigestion.   Folbee Plus Tabs TAKE 1 TABLET BY MOUTH EVERY DAY   levothyroxine 50 MCG tablet Commonly known as: SYNTHROID TAKE 1 TABLET BY MOUTH DAILY BEFORE BREAKFAST What changed: See the new instructions.   nitroGLYCERIN 0.4 MG SL tablet Commonly known as: NITROSTAT Place 1 tablet (0.4 mg total) under the tongue every 5 (five) minutes as needed for chest pain.   polyethylene glycol 17 g packet Commonly known as: MIRALAX / GLYCOLAX Take 17 g by mouth daily as needed for mild constipation.   simvastatin 20 MG tablet Commonly  known as: ZOCOR TAKE 1 TABLET BY MOUTH EVERYDAY AT BEDTIME What changed: See the new instructions.   tamsulosin 0.4 MG Caps capsule Commonly known as: FLOMAX TAKE 1 CAPSULE BY MOUTH EVERY DAY What changed: additional instructions   traZODone 50 MG tablet Commonly known as: DESYREL TAKE 0.5-1 TABLETS (25-50 MG TOTAL) BY MOUTH AT BEDTIME AS NEEDED FOR SLEEP.   TYLENOL EXTRA STRENGTH PO Take 1-2 tablets by mouth daily as needed (for back pain).   vitamin B-12 500 MCG tablet Commonly known as: CYANOCOBALAMIN Take 500 mcg by mouth daily.            Home Infusion Instuctions  (From admission, onward)         Start     Ordered   04/26/19 0000  Home infusion instructions [facility] May follow [place] Dosing Protocol; May administer Cathflo as needed to maintain patency of vascular access device.; Flushing of vascular access device: per [facility] Protocol: 0.9% NaCl pre/post medica...    Question Answer Comment  Instructions May follow [place] Dosing Protocol   Instructions May administer Cathflo as  needed to maintain patency of vascular access device.   Instructions Flushing of vascular access device: per [facility] Protocol: 0.9% NaCl pre/post medication administration and prn patency; Heparin 100 u/ml, 28m for implanted ports and Heparin 10u/ml, 550mfor all other central venous catheters.   Instructions May follow [facility] Anaphylaxis Protocol for First Dose Administration in the home: 0.9% NaCl at 25-50 ml/hr to maintain IV access for protocol meds. Epinephrine 0.3 ml IV/IM PRN and Benadryl 25-50 IV/IM PRN s/s of anaphylaxis.   Instructions [facility] Infusion Coordinator (RN) to assist per patient IV care needs in the home PRN.      04/26/19 0915         Allergies  Allergen Reactions  . Losartan Potassium Other (See Comments)    Not known  . Naproxen Nausea And Vomiting  . Penicillins Rash    Did it involve swelling of the face/tongue/throat, SOB, or low BP?  No Did it involve sudden or severe rash/hives, skin peeling, or any reaction on the inside of your mouth or nose? Yes Did you need to seek medical attention at a hospital or doctor's office? Unknown When did it last happen? If all above answers are "NO", may proceed with cephalosporin use.  TOLERATED CEFTRIAXONE 10/07/2018   Follow-up Information    HuMarin OlpMD. Schedule an appointment as soon as possible for a visit in 1 week(s).   Specialty: Family Medicine Contact information: [address] [id number]          The results of significant diagnostics from this hospitalization (including imaging, microbiology, ancillary and laboratory) are listed below for reference.    Significant Diagnostic Studies: Ct Head Wo Contrast  Result Date: 04/20/2019 CLINICAL DATA:  Difficulty with ambulation and balance. Generalized weakness. Probable right lower extremity cellulitis. No reported injury. EXAM: CT HEAD WITHOUT CONTRAST TECHNIQUE: Contiguous axial images were obtained from the base of the skull through the vertex without intravenous contrast. COMPARISON:  10/07/2018 head CT. FINDINGS: Brain: No evidence of parenchymal hemorrhage or extra-axial fluid collection. No mass lesion, mass effect, or midline shift. No CT evidence of acute infarction. Nonspecific mild subcortical and periventricular white matter hypodensity, most in keeping with chronic small vessel ischemic change. Generalized cerebral volume loss. There is symmetric dilatation of the lateral ventricles bilaterally, similar to prior. Vascular: No acute abnormality. Skull: No evidence of calvarial fracture. Sinuses/Orbits: The visualized paranasal sinuses are essentially clear. Other:  The mastoid air cells are unopacified. IMPRESSION: 1.  No evidence of acute intracranial abnormality. 2. Symmetric dilatation of the lateral ventricles, similar to prior head CT. While potentially due to  central cerebral volume loss, the lateral ventricular dilatation appears somewhat out of proportion to the generalized cerebral volume loss, and normal pressure hydrocephalus cannot be strictly excluded. Neurology consultation could be considered, as clinically warranted. 3. Mild chronic small vessel ischemic changes in the cerebral white matter. Electronically Signed   By: JaIlona Sorrel.D.   On: 04/20/2019 13:57   Dg Foot 2 Views Right  Result Date: 04/25/2019 CLINICAL DATA:  Right toe injury. Evaluate for osteomyelitis. Five months of right swelling. EXAM: RIGHT FOOT - 2 VIEW COMPARISON:  None. FINDINGS: Mild hallux valgus deformity. Minimal degenerate change over the first MTP joint and over the interphalangeal joints. No acute fracture or dislocation. Mild degenerate changes over the midfoot. Small inferior calcaneal spur. No bone destruction to suggest osteomyelitis. IMPRESSION: No acute findings.  No evidence of osteomyelitis. Mild hallux  valgus deformity.  Mild degenerate changes as described. Electronically Signed   By: [name] M.D.   On: 04/25/2019 16:27   [place] Ekg Site Rite  Result Date: 04/25/2019 If Site Rite image not attached, placement could not be confirmed due to current cardiac rhythm.   Microbiology: Recent Results (from the past 240 hour(s))  Culture, blood (Routine X 2) w Reflex to ID Panel     Status: None   Collection Time: 04/20/19  4:45 PM   Specimen: Site Not Specified; Blood  Result Value Ref Range Status   Specimen Description SITE NOT SPECIFIED  Final   Special Requests   Final    BOTTLES DRAWN AEROBIC AND ANAEROBIC Blood Culture adequate volume   Culture   Final    NO GROWTH 5 DAYS Performed at [facility], [address], [place], [place] [address]    Report Status 04/25/2019 FINAL  Final  SARS CORONAVIRUS 2 (TAT 6-24 HRS) Nasopharyngeal Nasopharyngeal Swab     Status: None   Collection Time: 04/20/19  5:05 PM   Specimen: Nasopharyngeal Swab   Result Value Ref Range Status   SARS Coronavirus 2 NEGATIVE NEGATIVE Final    Comment: (NOTE) SARS-CoV-2 target nucleic acids are NOT DETECTED. The SARS-CoV-2 RNA is generally detectable in upper and lower respiratory specimens during the acute phase of infection. Negative results do not preclude SARS-CoV-2 infection, do not rule out co-infections with other pathogens, and should not be used as the sole basis for treatment or other patient management decisions. Negative results must be combined with clinical observations, patient history, and epidemiological information. The expected result is Negative. Fact Sheet for Patients: SugarRoll.be Fact Sheet for Healthcare Providers: https://www.woods-mathews.com/ This test is not yet approved or cleared by the [place] FDA and  has been authorized for detection and/or diagnosis of SARS-CoV-2 by FDA under an Emergency Use Authorization (EUA). This EUA will remain  in effect (meaning this test can be used) for the duration of the COVID-19 declaration under Section 56 4(b)(1) of the Act, 21 U.S.C. section 360bbb-3(b)(1), unless the authorization is terminated or revoked sooner. Performed at [facility], [place] [address] [place], [place] [address]   Culture, blood (Routine X 2) w Reflex to ID Panel     Status: Abnormal   Collection Time: 04/20/19  5:37 PM   Specimen: BLOOD RIGHT HAND  Result Value Ref Range Status   Specimen Description BLOOD RIGHT HAND  Final   Special Requests   Final    BOTTLES DRAWN AEROBIC AND ANAEROBIC Blood Culture adequate volume   Culture  Setup Time   Final    GRAM POSITIVE COCCI AEROBIC BOTTLE ONLY CRITICAL RESULT CALLED TO, READ BACK BY AND VERIFIED WITH: Hendron [id number] AT 1024 BY CM Performed at [facility], [place] [address], [place], [place] [address]    Culture STAPHYLOCOCCUS AUREUS (A)  Final   Report Status 04/23/2019 FINAL  Final    Organism ID, Bacteria STAPHYLOCOCCUS AUREUS  Final      Susceptibility   Staphylococcus aureus - MIC*    CIPROFLOXACIN <=0.5 SENSITIVE Sensitive     ERYTHROMYCIN <=0.25 SENSITIVE Sensitive     GENTAMICIN <=0.5 SENSITIVE Sensitive     OXACILLIN 0.5 SENSITIVE Sensitive     TETRACYCLINE <=1 SENSITIVE Sensitive     VANCOMYCIN <=0.5 SENSITIVE Sensitive     TRIMETH/SULFA <=10 SENSITIVE Sensitive     CLINDAMYCIN <=0.25 SENSITIVE Sensitive     RIFAMPIN <=0.5  SENSITIVE Sensitive     Inducible Clindamycin NEGATIVE Sensitive     * STAPHYLOCOCCUS AUREUS  MRSA PCR Screening     Status: None   Collection Time: 04/21/19  4:25 AM   Specimen: Nasal Mucosa; Nasopharyngeal  Result Value Ref Range Status   MRSA by PCR NEGATIVE NEGATIVE Final    Comment:        The GeneXpert MRSA Assay (FDA approved for NASAL specimens only), is one component of a comprehensive MRSA colonization surveillance program. It is not intended to diagnose MRSA infection nor to guide or monitor treatment for MRSA infections. Performed at [facility], [place] [address], [place], [place] [address]   Culture, blood (routine x 2)     Status: None (Preliminary result)   Collection Time: 04/22/19  4:30 PM   Specimen: BLOOD LEFT ARM  Result Value Ref Range Status   Specimen Description BLOOD LEFT ARM  Final   Special Requests   Final    BOTTLES DRAWN AEROBIC AND ANAEROBIC Blood Culture adequate volume   Culture   Final    NO GROWTH 4 DAYS Performed at [facility], [place] [address] [place], [place] [address]    Report Status PENDING  Incomplete  Culture, blood (routine x 2)     Status: None (Preliminary result)   Collection Time: 04/22/19  4:35 PM   Specimen: BLOOD LEFT ARM  Result Value Ref Range Status   Specimen Description BLOOD LEFT ARM  Final   Special Requests   Final    BOTTLES DRAWN AEROBIC AND ANAEROBIC Blood Culture adequate volume   Culture   Final    NO GROWTH 4 DAYS Performed at [facility], [place] [address], [place], [place] [address]    Report Status PENDING  Incomplete     Labs: Basic Metabolic Panel: Recent Labs  Lab 04/20/19 1237 04/21/19 0316 04/22/19 0455 04/23/19 0331 04/26/19 0509 04/27/19 0317  NA 135 136 139 138 138  --   K 4.4 4.4 4.3 4.7 4.0  --   CL 105 106 108 106 101  --   CO2 20* 20* '23 25 26  ' --   GLUCOSE 128* 98 105* 100* 88  --   BUN 32* 31* 24* 20 20  --   CREATININE 1.47* 1.27* 1.12 1.14 1.07 1.01  CALCIUM 8.8* 8.3* 8.4* 8.6* 8.4*  --    Liver Function Tests: Recent Labs  Lab 04/20/19 1237  AST 27  ALT 20  ALKPHOS 45  BILITOT 1.0  PROT 6.5  ALBUMIN 3.4*   No results for input(s): LIPASE, AMYLASE in the last 168 hours. No results for input(s): AMMONIA in the last 168 hours. CBC: Recent Labs  Lab 04/20/19 1237 04/21/19 0316 04/22/19 0455 04/23/19 0331 04/24/19 0346 04/26/19 0509  WBC 31.9* 23.3* 16.4* 12.7* 10.7* 10.6*  NEUTROABS 29.0*  --   --   --   --   --   HGB 11.6* 10.7* 10.4* 10.9* 10.7* 10.4*  HCT 34.8* 32.2* 30.9* 32.9* 31.8* 31.1*  MCV 102.7* 103.9* 102.3* 102.5* 101.6* 102.6*  PLT 256 244 217 234 253 308   Cardiac Enzymes: No results for input(s): CKTOTAL, CKMB, CKMBINDEX, TROPONINI in the last 168 hours. BNP: BNP (last 3 results) No results for input(s): BNP in the last 8760 hours.  ProBNP (last 3 results) No results for input(s): PROBNP in the last 8760 hours.  CBG: No results for input(s): GLUCAP in the last 168 hours.  Signed:  Domenic Polite MD.  [facility] 04/27/2019, 8:01 AM

## 2019-04-27 NOTE — Significant Event (Signed)
Rapid Response Event Note  Overview: Hypotension - MEWS Yellow - Active GIB   Initial Focused Assessment: I received a call from nursing staff to report a yellow MEWS score, staff informed that patient's BP was soft, they had already notified an MD, and patient was being transferred to PCU, they stated that they had what they needed.   I went up to see the patient to make sure that he was okay, upon arrival, patient was quite pale but warm, SBP was in the 70s, MAPS upper 50s, HR 70s, 99% on RA, and normal respiratory effort and drive. I was then told that the low BPs started after the patient had several bloody dark stools. Lung sounds - clear - good air movement. Patient denied pain, denied being dizzy or lightheaded, patient remained fairly alert oriented x 4.  Interventions: -- Patient received 1L NS bolus, SBP did not improve and T/S was still being processed, I initiated a 500 cc NS bolus   Plan of Care: -- SBP improved into 80s overall -- Transferred to 5W28, 5W nurse to start blood as soon as its ready -- Monitor VS and monitor for signs of shock  -- I came by briefly around 1645 in between patients and first unit of blood was still infusing, I asked the nurse to increase the rate because per the nurse, patient was still having frequent stools and his BP was still marginal SBP 80s, MAP low 60s. RN to page MD and ask if more blood can be given, which I recommended.    Event Summary:  Start Time 1205 End Time 1435  Zachary Decker

## 2019-04-27 NOTE — Progress Notes (Signed)
08:30 Patient used bedpan and had a large dark stool, loose with formed stool mixed in.  08:35 Notified Dr Starla Link of dark stool.  09:30 Patient had second large, loose, black stool.  Dr. Starla Link notified.  Protonix PO was administered.    11:30 Patient had a third large, loose black stool.  BP  78/52, HR 67, patient was pale in color and sleepy, oriented x 4, easily arousable by voice.  Dr Starla Link was immediately notified.  One liter normal saline bolus was initiated.  Rapid response was called. Telemetry was put on patient.  HGB was drawn.  Consent to receive blood was signed. Type and screen was ordered. Transfer to Progressive care was ordered.  Patient was asked to remain NPO  13:30 Pantoprazole IVP was initiated.  13:35 Report given to [place] on 5 W.  Rapid response nurse and Lexi RN transferred patient to 5W.

## 2019-04-27 NOTE — Consult Note (Signed)
Referring Provider:  [facility]         Primary Care Physician:  Zachary Olp, MD Primary Gastroenterologist:  None           Reason for Consultation:  GI bleed                 ASSESSMENT /  PLAN     85 yo male with acute upper GI bleeding associated with hemodynamic instability requiring call to Rapid Response. He has passed 2-3 melenic stools this am in setting of baby asa and recent Lovenox -keep NPO -PPI changed to gtt for now -He will need EGD. The risks and benefits of EGD were discussed and the patient agrees to proceed.  -Patient being transferred ( ? To Progressive Care floor I think) -A uPRBC already ordered.  -Monitor H+H   HPI:     Z[facility] is a 85 y.o. male with pmh significant for HTN, CAD / CABG, PVD, hypothyroidism, [facility] of colon cancer in brother in his 29's.  admitted 10/28 with RLE cellulitis and leukocytosis. He has MSSA bacteremia, possible osteomyelitis of right toes. ID following. TEE negative for large vegetations. Patient has been on antibiotics, got PICC line for home antibiotics He was ready for discharge today but had two melenic stools. His hgb has now declined from 10.4 to 7.3. He was hypotensive earlier this am. Rapid response called. Blood pressure responded to fluids. Blood transfusion has been ordered. Patient was getting daily Lovenox in hospital ( last dose was last night).   [name] has no abdominal pain. No nausea / vomiting. At home he hasn't had any rectal bleeding / melena. He takes a daily baby asa and pepcid at home. No history of PUD   Past Medical History:  Diagnosis Date  . Acute myocardial infarction of inferoposterior wall, subsequent episode of care ([facility])   . CAD (coronary artery disease)   . Encounter for long-term (current) use of other medications   . Family history of malignant neoplasm of gastrointestinal tract   . GERD (gastroesophageal reflux disease)   . Hypercholesterolemia   . Hypertension   .  Hypertrophy of prostate with urinary obstruction and other lower urinary tract symptoms (LUTS)   . Personal history of thrombophlebitis   . PVD (peripheral vascular disease) ([facility])   . Respiratory failure ([facility])   . Unspecified adverse effect of unspecified drug, medicinal and biological substance   . Unspecified hypothyroidism     Past Surgical History:  Procedure Laterality Date  . CHOLECYSTECTOMY  02/26/2012   Procedure: LAPAROSCOPIC CHOLECYSTECTOMY WITH INTRAOPERATIVE CHOLANGIOGRAM;  Surgeon: Zachary Hook, DO;  Location: [facility];  Service: General;  Laterality: N/A;  . CORONARY ARTERY BYPASS GRAFT     x 5 on August 29, 2008  . KNEE SURGERY    . tachecostomy placement     after heart attack  . TONSILLECTOMY      Prior to Admission medications   Medication Sig Start Date End Date Taking? Authorizing Provider  Acetaminophen (TYLENOL EXTRA STRENGTH PO) Take 1-2 tablets by mouth daily as needed (for back pain).   Yes [provider]  aspirin 81 MG tablet Take 81 mg by mouth daily.     Yes [provider]  B Complex-C-Folic Acid (FOLBEE PLUS) TABS TAKE 1 TABLET BY MOUTH EVERY DAY 11/03/18  Yes Zachary Olp, MD  diclofenac sodium (VOLTAREN) 1 % GEL APPLY 2 GRAMS TO AFFECTED AREA 4 TIMES A DAY Patient taking differently:  Apply 2 g topically 4 (four) times daily.  04/11/19  Yes Zachary Olp, MD  famotidine (PEPCID) 20 MG tablet Take 20 mg by mouth as needed for heartburn or indigestion.    Yes [provider]  lisinopril (PRINIVIL,ZESTRIL) 2.5 MG tablet Take 1 tablet (2.5 mg total) by mouth daily. 04/13/18  Yes Zachary Olp, MD  Multiple Vitamins-Minerals (CENTRUM SILVER 50+MEN) TABS Take 1 tablet by mouth daily.   Yes [provider]  nitroGLYCERIN (NITROSTAT) 0.4 MG SL tablet Place 1 tablet (0.4 mg total) under the tongue every 5 (five) minutes as needed for chest pain. 04/13/18  Yes Zachary Olp, MD  simvastatin (ZOCOR) 20 MG tablet TAKE 1  TABLET BY MOUTH EVERYDAY AT BEDTIME Patient taking differently: Take 20 mg by mouth at bedtime.  04/13/19  Yes Zachary Olp, MD  traZODone (DESYREL) 50 MG tablet TAKE 0.5-1 TABLETS (25-50 MG TOTAL) BY MOUTH AT BEDTIME AS NEEDED FOR SLEEP. 12/03/18  Yes Zachary Olp, MD  vitamin B-12 (CYANOCOBALAMIN) 500 MCG tablet Take 500 mcg by mouth daily.   Yes [provider]  carvedilol (COREG) 3.125 MG tablet TAKE 1 TABLET TWICE A DAY WITH FOOD 04/27/19   Zachary Olp, MD  ceFAZolin (ANCEF) IVPB Inject 2 g into the vein every 8 (eight) hours. Indication: bacteremia Last Day of Therapy:  05/05/19 Labs - Once weekly:  CBC/D and BMP, Labs - Every other week:  ESR and CRP 04/26/19   Zachary Polite, MD  levothyroxine (SYNTHROID) 50 MCG tablet TAKE 1 TABLET BY MOUTH DAILY BEFORE BREAKFAST 04/25/19   Zachary Olp, MD  polyethylene glycol (MIRALAX / GLYCOLAX) 17 g packet Take 17 g by mouth daily as needed for mild constipation. 04/26/19   Zachary Polite, MD  tamsulosin (FLOMAX) 0.4 MG CAPS capsule TAKE 1 CAPSULE BY MOUTH EVERY DAY 04/25/19   Zachary Olp, MD    Current Facility-Administered Medications  Medication Dose Route Frequency Provider Last Rate Last Dose  . acetaminophen (TYLENOL) tablet 650 mg  650 mg Oral Q6H PRN Samuella Cota, MD   650 mg at 04/26/19 1905  . carvedilol (COREG) tablet 3.125 mg  3.125 mg Oral BID WC Samuella Cota, MD   3.125 mg at 04/27/19 0849  . ceFAZolin (ANCEF) IVPB 2g/100 mL premix  2 g Intravenous Q8H Carlyle Basques, MD 200 mL/hr at 04/27/19 0638 2 g at 04/27/19 7867  . famotidine (PEPCID) tablet 20 mg  20 mg Oral PRN Samuella Cota, MD      . levothyroxine (SYNTHROID) tablet 50 mcg  50 mcg Oral QAC breakfast Samuella Cota, MD   50 mcg at 04/27/19 [phone]  . ondansetron (ZOFRAN) tablet 4 mg  4 mg Oral Q6H PRN Samuella Cota, MD       Or  . ondansetron [facility]) injection 4 mg  4 mg Intravenous Q6H PRN Samuella Cota, MD       . pantoprazole (PROTONIX) injection 40 mg  40 mg Intravenous Q12H Alekh, Kshitiz, MD      . polyethylene glycol (MIRALAX / GLYCOLAX) packet 17 g  17 g Oral Daily PRN Samuella Cota, MD   17 g at 04/22/19 2150  . simvastatin (ZOCOR) tablet 20 mg  20 mg Oral QHS Samuella Cota, MD   20 mg at 04/26/19 2101  . sodium chloride 0.9 % bolus 1,000 mL  1,000 mL Intravenous Once Aline August, MD      . sodium chloride  flush (NS) 0.9 % injection 10-40 mL  10-40 mL Intracatheter Q12H Zachary Polite, MD   10 mL at 04/27/19 0853  . sodium chloride flush (NS) 0.9 % injection 10-40 mL  10-40 mL Intracatheter PRN Zachary Polite, MD   10 mL at 04/27/19 1046  . sodium chloride flush (NS) 0.9 % injection 3 mL  3 mL Intravenous Q12H Samuella Cota, MD   3 mL at 04/27/19 1046  . tamsulosin (FLOMAX) capsule 0.4 mg  0.4 mg Oral QHS Samuella Cota, MD   0.4 mg at 04/26/19 2101  . traZODone (DESYREL) tablet 25-50 mg  25-50 mg Oral QHS PRN Samuella Cota, MD   50 mg at 04/25/19 2229    Allergies as of 04/20/2019 - Review Complete 04/20/2019  Allergen Reaction Noted  . Losartan potassium Other (See Comments) 03/22/2007  . Naproxen Nausea And Vomiting 10/31/2008  . Penicillins Rash 03/22/2007    Family History  Problem Relation Age of Onset  . Heart disease Mother   . Tuberculosis Father     Social History   Socioeconomic History  . Marital status: Married    Spouse name: Not on file  . Number of children: Not on file  . Years of education: Not on file  . Highest education level: Master's degree (e.g., MA, MS, MEng, MEd, MSW, MBA)  Occupational History    Comment: Retired Careers adviser   Social Needs  . Financial resource strain: Not on file  . Food insecurity    Worry: Not on file    Inability: Not on file  . Transportation needs    Medical: Not on file    Non-medical: Not on file  Tobacco Use  . Smoking status: Never Smoker  . Smokeless tobacco: Never Used  Substance and  Sexual Activity  . Alcohol use: No  . Drug use: No  . Sexual activity: Not on file  Lifestyle  . Physical activity    Days per week: Not on file    Minutes per session: Not on file  . Stress: Not on file  Relationships  . Social Herbalist on phone: Not on file    Gets together: Not on file    Attends religious service: Not on file    Active member of club or organization: Not on file    Attends meetings of clubs or organizations: Not on file    Relationship status: Not on file  . Intimate partner violence    Fear of current or ex partner: Not on file    Emotionally abused: Not on file    Physically abused: Not on file    Forced sexual activity: Not on file  Other Topics Concern  . Not on file  Social History Narrative   Married 1958. 2 kids (boy, girl). 2 grandkids (boy and girl).       Retired from [employer] [place]      [place]: former Air cabin crew, tv, reading    Review of Systems: All systems reviewed and negative except where noted in HPI.  Physical Exam: Vital signs in last 24 hours: Temp:  [97.3 F (36.3 C)-97.8 F (36.6 C)] 97.4 F (36.3 C) (11/04 1157) Pulse Rate:  [61-69] 65 (11/04 1226) Resp:  [12-18] 12 (11/04 1157) BP: (78-128)/(52-63) 96/54 (11/04 1226) SpO2:  [95 %-99 %] 99 % (11/04 1226) Last BM Date: 04/27/19 General:  Pale, Alert, well-developed, male in NAD Psych:  Pleasant, cooperative. Normal mood and affect.  Eyes:  Pupils equal, sclera clear, no icterus.   Conjunctiva pink. Ears:  Normal auditory acuity. Nose:  No deformity, discharge,  or lesions. Neck:  Supple; no masses Lungs:  Clear throughout to auscultation.   No wheezes, crackles, or rhonchi.  Heart:  Regular rate and rhythm;  no lower extremity edema Abdomen:  Soft, non-distended, nontender, BS active, no palp mass   Rectal:  Deferred  Msk:  Symmetrical without gross deformities. . Neurologic:  Alert and  oriented x4;  grossly normal neurologically. Skin:   Intact without significant lesions or rashes.   Intake/Output from previous day: 11/03 0701 - 11/04 0700 In: 483 [P.O.:480; I.V.:3] Out: 800 [Urine:800] Intake/Output this shift: Total I/O In: 900 [IV Piggyback:900] Out: 1 [Stool:1]  Lab Results: Recent Labs    04/26/19 0509 04/27/19 1104  WBC 10.6*  --   HGB 10.4* 7.3*  HCT 31.1* 22.6*  PLT 308  --    BMET Recent Labs    04/26/19 0509 04/27/19 0317  NA 138  --   K 4.0  --   CL 101  --   CO2 26  --   GLUCOSE 88  --   BUN 20  --   CREATININE 1.07 1.01  CALCIUM 8.4*  --      . CBC Latest Ref Rng & Units 04/27/2019 04/26/2019 04/24/2019  WBC 4.0 - 10.5 K/uL - 10.6(H) 10.7(H)  Hemoglobin 13.0 - 17.0 g/dL 7.3(L) 10.4(L) 10.7(L)  Hematocrit 39.0 - 52.0 % 22.6(L) 31.1(L) 31.8(L)  Platelets 150 - 400 K/uL - 308 253    . CMP Latest Ref Rng & Units 04/27/2019 04/26/2019 04/23/2019  Glucose 70 - 99 mg/dL - 88 100(H)  BUN 8 - 23 mg/dL - 20 20  Creatinine 0.61 - 1.24 mg/dL 1.01 1.07 1.14  Sodium 135 - 145 mmol/L - 138 138  Potassium 3.5 - 5.1 mmol/L - 4.0 4.7  Chloride 98 - 111 mmol/L - 101 106  CO2 22 - 32 mmol/L - 26 25  Calcium 8.9 - 10.3 mg/dL - 8.4(L) 8.6(L)  Total Protein 6.5 - 8.1 g/dL - - -  Total Bilirubin 0.3 - 1.2 mg/dL - - -  Alkaline Phos 38 - 126 U/L - - -  AST 15 - 41 U/L - - -  ALT 0 - 44 U/L - - -   Studies/Results: Dg Foot 2 Views Right  Result Date: 04/25/2019 CLINICAL DATA:  Right toe injury. Evaluate for osteomyelitis. Five months of right swelling. EXAM: RIGHT FOOT - 2 VIEW COMPARISON:  None. FINDINGS: Mild hallux valgus deformity. Minimal degenerate change over the first MTP joint and over the interphalangeal joints. No acute fracture or dislocation. Mild degenerate changes over the midfoot. Small inferior calcaneal spur. No bone destruction to suggest osteomyelitis. IMPRESSION: No acute findings.  No evidence of osteomyelitis. Mild hallux valgus deformity.  Mild degenerate changes as described.  Electronically Signed   By: [facility] M.D.   On: 04/25/2019 16:27   [place] Ekg Site Rite  Result Date: 04/25/2019 If Site Rite image not attached, placement could not be confirmed due to current cardiac rhythm.   Principal Problem:   MSSA bacteremia Active Problems:   Hypothyroidism   HYPERCHOLESTEROLEMIA   Cellulitis of right leg   Dehydration   Generalized weakness    Tye Savoy, NP-C @  04/27/2019, 12:48 PM

## 2019-04-27 NOTE — Progress Notes (Signed)
Notified by floor RN regarding ongoing GI bleeding. Blood pressure has been 0000000 systolic with MAP mid 0000000.  Reports patient is tired but responding appropriately. Has 2 PIVs, 1 with pRBC transfusion, the other with PPI gtt  Rec: Continue resuscitation with IVFs and pRBC (an additional unit of pRBC ordered by hospitalist) Continue PPI gtt If ongoing bleeding with hemodynamic instability overnight (before planned EGD tomorrow) then patient will need to be transferred to the ICU for higher level of care and consideration be emergent bedside EGD.  I will follow response to treatment Repeat Hgb after completion of red cell transfusion If further decline, Critical Care consult and central line assess will be needed

## 2019-04-27 NOTE — Progress Notes (Signed)
Patient ID: Zachary Decker, male   DOB: 05/19/1933, 85 y.o.   MRN: [id number]  PROGRESS NOTE    BLAZEN GEORGIADIS  [id number] DOB: May 21, 1933 DOA: 04/20/2019 PCP: [name], MD   Brief Narrative:  85 year old male with history of hypothyroidism, left leg cellulitis in April 2020 presented with worsening pain and discomfort in his right leg associated with difficulty in ambulating.  He was found to have sepsis secondary to MSSA bacteremia with right lower extremity cellulitis.  ID has recommended 2 weeks of IV Ancef till 05/05/2019.  Assessment & Plan:   Probable upper GI bleeding causing melena -Patient had melena this morning.  Will get stat H&H.  DC aspirin and Lovenox.  Start Protonix. -We consult GI. -Hold discharge for today. -Blood pressure on the lower side, will start IV fluids.  Sepsis: Present on admission MSSA bacteremia Right lower extremity cellulitis -1 out of 2 blood cultures was positive with MSSA, was on IV Rocephin which was subsequently switched to IV Ancef -ID recommended 2 weeks of IV Ancef till 05/05/2019.  Patient has a PICC line. -2D echo with normal EF and no vegetations.  Continue right leg elevation -PT recommended SNF: Family and patient chose to go home with home health services instead  Acute kidney injury -Due to sepsis.  Resolved  Generalized weakness Gait imbalance at home -Suspect movement disorder at baseline.  Will need outpatient neurology evaluation  Symmetric dilatation of the lateral ventricles -Unchanged from prior CT head imaging.  Cannot rule out NPH.  Outpatient neuro eval  Hypothyroidism -Continue Synthroid  Hyperlipidemia -Continue statin  Chronic kidney disease stage III -Baseline creatinine around 1.3.  This is stable.  DVT prophylaxis: Lovenox Code Status: Full Family Communication: Called and spoke to wife/Rachel on phone on 04/27/2019 Disposition Plan: home in 1-2 days once cleared by GI  Consultants: GI/ID   Procedures: None  Antimicrobials:  Anti-infectives (From admission, onward)   Start     Dose/Rate Route Frequency Ordered Stop   04/26/19 0000  ceFAZolin (ANCEF) IVPB     2 g Intravenous Every 8 hours 04/26/19 0915     04/22/19 1700  ceFAZolin (ANCEF) IVPB 2g/100 mL premix     2 g 200 mL/hr over 30 Minutes Intravenous Every 8 hours 04/22/19 1600     04/20/19 2000  cefTRIAXone (ROCEPHIN) 2 g in sodium chloride 0.9 % 100 mL IVPB  Status:  Discontinued     2 g 200 mL/hr over 30 Minutes Intravenous Every 24 hours 04/20/19 1956 04/22/19 1600   04/20/19 1715  vancomycin (VANCOCIN) IVPB 1000 mg/200 mL premix     1,000 mg 200 mL/hr over 60 Minutes Intravenous  Once 04/20/19 1704 04/20/19 1953      Subjective: Patient seen and examined at bedside.  He denies any overnight fever, nausea, vomiting.  Nursing staff states that he had black bowel movement this morning.  Objective: Vitals:   04/27/19 1252 04/27/19 1254 04/27/19 1303 04/27/19 1307  BP: (!) 86/59 (!) 89/49 (!) 89/55 (!) 77/45  Pulse: 61 63 63 64  Resp:      Temp:      TempSrc:      SpO2:   99% 99%  Weight:      Height:        Intake/Output Summary (Last 24 hours) at 04/27/2019 1329 Last data filed at 04/27/2019 0847 Gross per 24 hour  Intake 1140 ml  Output 601 ml  Net 539 ml   [employer]  04/20/19 1645  Weight: 83.9 kg    Examination:  General exam: Appears calm and comfortable.  Looks chronically ill Respiratory system: Bilateral decreased breath sounds at bases with some scattered crackles Cardiovascular system: S1 & S2 heard, Rate controlled Gastrointestinal system: Abdomen is nondistended, soft and nontender. Normal bowel sounds heard. Extremities: No cyanosis, clubbing; right lower extremity erythema and mild tenderness present Central nervous system: Alert and oriented. No focal neurological deficits. Moving extremities Skin: No other rashes or ulcers. Psychiatry: Flat affect    Data Reviewed: I  have personally reviewed following labs and imaging studies  CBC: Recent Labs  Lab 04/21/19 0316 04/22/19 0455 04/23/19 0331 04/24/19 0346 04/26/19 0509 04/27/19 1104  WBC 23.3* 16.4* 12.7* 10.7* 10.6*  --   HGB 10.7* 10.4* 10.9* 10.7* 10.4* 7.3*  HCT 32.2* 30.9* 32.9* 31.8* 31.1* 22.6*  MCV 103.9* 102.3* 102.5* 101.6* 102.6*  --   PLT 244 217 234 253 308  --    Basic Metabolic Panel: Recent Labs  Lab 04/21/19 0316 04/22/19 0455 04/23/19 0331 04/26/19 0509 04/27/19 0317  NA 136 139 138 138  --   K 4.4 4.3 4.7 4.0  --   CL 106 108 106 101  --   CO2 20* 23 25 26   --   GLUCOSE 98 105* 100* 88  --   BUN 31* 24* 20 20  --   CREATININE 1.27* 1.12 1.14 1.07 1.01  CALCIUM 8.3* 8.4* 8.6* 8.4*  --    GFR: Estimated Creatinine Clearance: 64.4 mL/min (by C-G formula based on SCr of 1.01 mg/dL). Liver Function Tests: No results for input(s): AST, ALT, ALKPHOS, BILITOT, PROT, ALBUMIN in the last 168 hours. No results for input(s): LIPASE, AMYLASE in the last 168 hours. No results for input(s): AMMONIA in the last 168 hours. Coagulation Profile: No results for input(s): INR, PROTIME in the last 168 hours. Cardiac Enzymes: No results for input(s): CKTOTAL, CKMB, CKMBINDEX, TROPONINI in the last 168 hours. BNP (last 3 results) No results for input(s): PROBNP in the last 8760 hours. HbA1C: No results for input(s): HGBA1C in the last 72 hours. CBG: No results for input(s): GLUCAP in the last 168 hours. Lipid Profile: No results for input(s): CHOL, HDL, LDLCALC, TRIG, CHOLHDL, LDLDIRECT in the last 72 hours. Thyroid Function Tests: No results for input(s): TSH, T4TOTAL, FREET4, T3FREE, THYROIDAB in the last 72 hours. Anemia Panel: No results for input(s): VITAMINB12, FOLATE, FERRITIN, TIBC, IRON, RETICCTPCT in the last 72 hours. Sepsis Labs: Recent Labs  Lab 04/20/19 1706  LATICACIDVEN 1.0    Recent Results (from the past 240 hour(s))  Culture, blood (Routine X 2) w Reflex  to ID Panel     Status: None   Collection Time: 04/20/19  4:45 PM   Specimen: Site Not Specified; Blood  Result Value Ref Range Status   Specimen Description SITE NOT SPECIFIED  Final   Special Requests   Final    BOTTLES DRAWN AEROBIC AND ANAEROBIC Blood Culture adequate volume   Culture   Final    NO GROWTH 5 DAYS Performed at [facility], [address], [place], [place] [address]    Report Status 04/25/2019 FINAL  Final  SARS CORONAVIRUS 2 (TAT 6-24 HRS) Nasopharyngeal Nasopharyngeal Swab     Status: None   Collection Time: 04/20/19  5:05 PM   Specimen: Nasopharyngeal Swab  Result Value Ref Range Status   SARS Coronavirus 2 NEGATIVE NEGATIVE Final    Comment: (NOTE) SARS-CoV-2 target nucleic acids  are NOT DETECTED. The SARS-CoV-2 RNA is generally detectable in upper and lower respiratory specimens during the acute phase of infection. Negative results do not preclude SARS-CoV-2 infection, do not rule out co-infections with other pathogens, and should not be used as the sole basis for treatment or other patient management decisions. Negative results must be combined with clinical observations, patient history, and epidemiological information. The expected result is Negative. Fact Sheet for Patients: SugarRoll.be Fact Sheet for Healthcare Providers: https://www.woods-mathews.com/ This test is not yet approved or cleared by the [place] FDA and  has been authorized for detection and/or diagnosis of SARS-CoV-2 by FDA under an Emergency Use Authorization (EUA). This EUA will remain  in effect (meaning this test can be used) for the duration of the COVID-19 declaration under Section 56 4(b)(1) of the Act, 21 U.S.C. section 360bbb-3(b)(1), unless the authorization is terminated or revoked sooner. Performed at [facility], [place] [address] [place], [place] [address]   Culture, blood (Routine X 2) w Reflex to ID Panel      Status: Abnormal   Collection Time: 04/20/19  5:37 PM   Specimen: BLOOD RIGHT HAND  Result Value Ref Range Status   Specimen Description BLOOD RIGHT HAND  Final   Special Requests   Final    BOTTLES DRAWN AEROBIC AND ANAEROBIC Blood Culture adequate volume   Culture  Setup Time   Final    GRAM POSITIVE COCCI AEROBIC BOTTLE ONLY CRITICAL RESULT CALLED TO, READ BACK BY AND VERIFIED WITH: [place] [id number] AT 1024 BY CM Performed at [facility], [place] [address], [place], [place] [address]    Culture STAPHYLOCOCCUS AUREUS (A)  Final   Report Status 04/23/2019 FINAL  Final   Organism ID, Bacteria STAPHYLOCOCCUS AUREUS  Final      Susceptibility   Staphylococcus aureus - MIC*    CIPROFLOXACIN <=0.5 SENSITIVE Sensitive     ERYTHROMYCIN <=0.25 SENSITIVE Sensitive     GENTAMICIN <=0.5 SENSITIVE Sensitive     OXACILLIN 0.5 SENSITIVE Sensitive     TETRACYCLINE <=1 SENSITIVE Sensitive     VANCOMYCIN <=0.5 SENSITIVE Sensitive     TRIMETH/SULFA <=10 SENSITIVE Sensitive     CLINDAMYCIN <=0.25 SENSITIVE Sensitive     RIFAMPIN <=0.5 SENSITIVE Sensitive     Inducible Clindamycin NEGATIVE Sensitive     * STAPHYLOCOCCUS AUREUS  MRSA PCR Screening     Status: None   Collection Time: 04/21/19  4:25 AM   Specimen: Nasal Mucosa; Nasopharyngeal  Result Value Ref Range Status   MRSA by PCR NEGATIVE NEGATIVE Final    Comment:        The GeneXpert MRSA Assay (FDA approved for NASAL specimens only), is one component of a comprehensive MRSA colonization surveillance program. It is not intended to diagnose MRSA infection nor to guide or monitor treatment for MRSA infections. Performed at [facility], [place] [address] [place], [place] [address]   Culture, blood (routine x 2)     Status: None   Collection Time: 04/22/19  4:30 PM   Specimen: BLOOD LEFT ARM  Result Value Ref Range Status   Specimen Description BLOOD LEFT ARM  Final   Special Requests   Final    BOTTLES DRAWN  AEROBIC AND ANAEROBIC Blood Culture adequate volume   Culture   Final    NO GROWTH 5 DAYS Performed at [facility], [address] [place], [place] [address]    Report Status 04/27/2019 FINAL  Final  Culture, blood (routine x 2)     Status: None   Collection Time: 04/22/19  4:35 PM   Specimen: BLOOD LEFT ARM  Result Value Ref Range Status   Specimen Description BLOOD LEFT ARM  Final   Special Requests   Final    BOTTLES DRAWN AEROBIC AND ANAEROBIC Blood Culture adequate volume   Culture   Final    NO GROWTH 5 DAYS Performed at [facility], [address] [place], [place] [address]    Report Status 04/27/2019 FINAL  Final         Radiology Studies: Dg Foot 2 Views Right  Result Date: 04/25/2019 CLINICAL DATA:  Right toe injury. Evaluate for osteomyelitis. Five months of right swelling. EXAM: RIGHT FOOT - 2 VIEW COMPARISON:  None. FINDINGS: Mild hallux valgus deformity. Minimal degenerate change over the first MTP joint and over the interphalangeal joints. No acute fracture or dislocation. Mild degenerate changes over the midfoot. Small inferior calcaneal spur. No bone destruction to suggest osteomyelitis. IMPRESSION: No acute findings.  No evidence of osteomyelitis. Mild hallux valgus deformity.  Mild degenerate changes as described. Electronically Signed   By: [name] M.D.   On: 04/25/2019 16:27   [place] Ekg Site Rite  Result Date: 04/25/2019 If Site Rite image not attached, placement could not be confirmed due to current cardiac rhythm.       Scheduled Meds: . sodium chloride   Intravenous Once  . carvedilol  3.125 mg Oral BID WC  . levothyroxine  50 mcg Oral QAC breakfast  . pantoprazole (PROTONIX) IV  40 mg Intravenous Q12H  . [START ON 05/01/2019] pantoprazole  40 mg Intravenous Q12H  . simvastatin  20 mg Oral QHS  . sodium chloride flush  10-40 mL Intracatheter Q12H  . sodium chloride flush  3 mL Intravenous Q12H  . tamsulosin  0.4 mg Oral QHS    Continuous Infusions: .  ceFAZolin (ANCEF) IV 2 g (04/27/19 [id number])  . pantoprazole (PROTONIX) IVPB    . pantoprozole (PROTONIX) infusion            Aline August, MD [facility] 04/27/2019, 1:29 PM

## 2019-04-27 NOTE — Progress Notes (Signed)
Notified MD of patients multiple bloody bowel movements and if he wanted to order another unit of blood instead of waiting for a second h+h.

## 2019-04-28 ENCOUNTER — Encounter (HOSPITAL_COMMUNITY): Payer: Self-pay | Admitting: Emergency Medicine

## 2019-04-28 ENCOUNTER — Inpatient Hospital Stay (HOSPITAL_COMMUNITY): Payer: Medicare Other | Admitting: Certified Registered"

## 2019-04-28 ENCOUNTER — Encounter (HOSPITAL_COMMUNITY)
Admission: EM | Disposition: A | Discharge status: Home Health | Payer: Self-pay | Source: Home / Self Care | Attending: Internal Medicine

## 2019-04-28 DIAGNOSIS — D62 Acute posthemorrhagic anemia: Secondary | ICD-10-CM

## 2019-04-28 DIAGNOSIS — K269 Duodenal ulcer, unspecified as acute or chronic, without hemorrhage or perforation: Secondary | ICD-10-CM

## 2019-04-28 HISTORY — PX: BIOPSY: SHX5522

## 2019-04-28 HISTORY — PX: ESOPHAGOGASTRODUODENOSCOPY (EGD) WITH PROPOFOL: SHX5813

## 2019-04-28 LAB — BASIC METABOLIC PANEL
Anion gap: 7 (ref 5–15)
BUN: 49 mg/dL — ABNORMAL HIGH (ref 8–23)
CO2: 23 mmol/L (ref 22–32)
Calcium: 7.5 mg/dL — ABNORMAL LOW (ref 8.9–10.3)
Chloride: 108 mmol/L (ref 98–111)
Creatinine, Ser: 1.13 mg/dL (ref 0.61–1.24)
GFR calc Af Amer: >60 mL/min (ref >60)
GFR calc non Af Amer: 60 mL/min — ABNORMAL LOW (ref >60)
Glucose, Bld: 107 mg/dL — ABNORMAL HIGH (ref 70–99)
Potassium: 4.2 mmol/L (ref 3.5–5.1)
Sodium: 138 mmol/L (ref 135–145)

## 2019-04-28 LAB — CBC WITH DIFFERENTIAL/PLATELET
Abs Immature Granulocytes: 0.24 10*3/uL — ABNORMAL HIGH (ref 0.00–0.07)
Basophils Absolute: 0.1 10*3/uL (ref 0.0–0.1)
Basophils Relative: 1 %
Eosinophils Absolute: 0.2 10*3/uL (ref 0.0–0.5)
Eosinophils Relative: 2 %
HCT: 24.8 % — ABNORMAL LOW (ref 39.0–52.0)
Hemoglobin: 8.2 g/dL — ABNORMAL LOW (ref 13.0–17.0)
Immature Granulocytes: 2 %
Lymphocytes Relative: 16 %
Lymphs Abs: 2 10*3/uL (ref 0.7–4.0)
MCH: 32.5 pg (ref 26.0–34.0)
MCHC: 33.1 g/dL (ref 30.0–36.0)
MCV: 98.4 fL (ref 80.0–100.0)
Monocytes Absolute: 1 10*3/uL (ref 0.1–1.0)
Monocytes Relative: 9 %
Neutro Abs: 8.6 10*3/uL — ABNORMAL HIGH (ref 1.7–7.7)
Neutrophils Relative %: 70 %
Platelets: 213 10*3/uL (ref 150–400)
RBC: 2.52 MIL/uL — ABNORMAL LOW (ref 4.22–5.81)
RDW: 16.2 % — ABNORMAL HIGH (ref 11.5–15.5)
WBC: 12.1 10*3/uL — ABNORMAL HIGH (ref 4.0–10.5)
nRBC: 0 % (ref 0.0–0.2)

## 2019-04-28 LAB — MAGNESIUM: Magnesium: 1.9 mg/dL (ref 1.7–2.4)

## 2019-04-28 SURGERY — ESOPHAGOGASTRODUODENOSCOPY (EGD) WITH PROPOFOL
Anesthesia: Monitor Anesthesia Care

## 2019-04-28 MED ORDER — LACTATED RINGERS IV SOLN
INTRAVENOUS | Status: DC | PRN
  Administered 2019-04-28: 13:00:00 via INTRAVENOUS

## 2019-04-28 MED ORDER — PROPOFOL 10 MG/ML IV BOLUS
INTRAVENOUS | Status: DC | PRN
  Administered 2019-04-28: 20 mg via INTRAVENOUS

## 2019-04-28 MED ORDER — METOCLOPRAMIDE HCL 5 MG/ML IJ SOLN
10.0000 mg | Freq: Once | INTRAMUSCULAR | Status: AC
  Administered 2019-04-28: 10 mg via INTRAVENOUS
  Filled 2019-04-28: qty 2

## 2019-04-28 MED ORDER — PHENYLEPHRINE HCL (PRESSORS) 10 MG/ML IV SOLN
INTRAVENOUS | Status: DC | PRN
  Administered 2019-04-28 (×2): 40 ug via INTRAVENOUS

## 2019-04-28 MED ORDER — PROPOFOL 500 MG/50ML IV EMUL
INTRAVENOUS | Status: DC | PRN
  Administered 2019-04-28: 50 ug/kg/min via INTRAVENOUS

## 2019-04-28 SURGICAL SUPPLY — 15 items

## 2019-04-28 NOTE — Progress Notes (Signed)
Patient and family requesting hospital transport for discharge.

## 2019-04-28 NOTE — Anesthesia Preprocedure Evaluation (Addendum)
Anesthesia Evaluation  Patient identified by MRN, date of birth, ID band Patient awake    Reviewed: Allergy & Precautions, NPO status , Patient's Chart, lab work & pertinent test results  History of Anesthesia Complications Negative for: history of anesthetic complications  Airway Mallampati: II  TM Distance: >3 FB Neck ROM: Full    Dental no notable dental hx. (+) Dental Advisory Given   Pulmonary neg pulmonary ROS,    Pulmonary exam normal        Cardiovascular hypertension, + CAD and + Past MI  Normal cardiovascular exam  IMPRESSIONS    1. Left ventricular ejection fraction, by visual estimation, is 45 to 50%. The left ventricle has mildly decreased function. There is no left ventricular hypertrophy.  2. Basal and mid inferolateral wall is abnormal.  3. Left ventricular diastolic parameters are consistent with Grade I diastolic dysfunction (impaired relaxation).  4. Global right ventricle has normal systolic function.The right ventricular size is normal. No increase in right ventricular wall thickness.  5. Left atrial size was normal.  6. Right atrial size was normal.  7. The mitral valve is normal in structure. Mild mitral valve regurgitation. No evidence of mitral stenosis.  8. The tricuspid valve is normal in structure. Tricuspid valve regurgitation is mild.  9. The aortic valve is tricuspid. Aortic valve regurgitation is trivial. No evidence of aortic valve sclerosis or stenosis. 10. There is Moderate calcification of the aortic valve. 11. There is Moderate thickening of the aortic valve. 12. The pulmonic valve was normal in structure. Pulmonic valve regurgitation is trivial. 13. Mildly elevated pulmonary artery systolic pressure. 14. The inferior vena cava is normal in size with greater than 50% respiratory variability, suggesting right atrial pressure of 3 mmHg. 15. No vegetations detected.   Neuro/Psych Anxiety  negative neurological ROS     GI/Hepatic Neg liver ROS, GERD  ,  Endo/Other  Hypothyroidism   Renal/GU Renal disease     Musculoskeletal negative musculoskeletal ROS (+)   Abdominal   Peds  Hematology negative hematology ROS (+) anemia ,   Anesthesia Other Findings Day of surgery medications reviewed with the patient.  Reproductive/Obstetrics                            Anesthesia Physical Anesthesia Plan  ASA: III  Anesthesia Plan: MAC   Post-op Pain Management:    Induction:   PONV Risk Score and Plan: Ondansetron and Propofol infusion  Airway Management Planned: Natural Airway  Additional Equipment:   Intra-op Plan:   Post-operative Plan:   Informed Consent: I have reviewed the patients History and Physical, chart, labs and discussed the procedure including the risks, benefits and alternatives for the proposed anesthesia with the patient or authorized representative who has indicated his/her understanding and acceptance.     Dental advisory given  Plan Discussed with: Anesthesiologist and CRNA  Anesthesia Plan Comments:         Anesthesia Quick Evaluation

## 2019-04-28 NOTE — Progress Notes (Signed)
Physical Therapy Treatment Patient Details Name: Zachary Decker MRN: [id number] DOB: [birth year] Today's Date: 04/28/2019    History of Present Illness Pt is an 85 y/o male admitted secondary to bilateral LE cellulitis and inability to ambulate. PMH including but not limited to MI, CAD s/p CABG.    PT Comments    Improved sitting balance today, pt sat at edge of bed x 6 minutes without loss of balance. +2 mod assist to stand with stedy. Attempted sit to stand with RW, however pt was unable to achieve full upright standing position 2* heavy posterior lean. Performed supine BLE strengthening exercises. Pt puts forth good effort.   Follow Up Recommendations  SNF     Equipment Recommendations  None recommended by PT    Recommendations for Other Services       Precautions / Restrictions Precautions Precautions: Fall Restrictions Weight Bearing Restrictions: No    Mobility  Bed Mobility Overal bed mobility: Needs Assistance Bed Mobility: Supine to Sit     Supine to sit: HOB elevated;Mod assist     General bed mobility comments: Increased time with heavy use of hand rails for trunk elevation; mod A to advance BLEs to EOB  Transfers Overall transfer level: Needs assistance Equipment used: Rolling walker (2 wheeled) Transfers: Sit to/from Stand Sit to Stand: +2 physical assistance;From elevated surface;Mod assist Stand pivot transfers: Total assist(used stedy to transfer)       General transfer comment: +2 max sit to stand with RW, pt had posterior and L lateral lean and was unable to come to full upright position; +2 mod to pull up to stand with stedy, +2 min to control descent to recliner  Ambulation/Gait                 Stairs             Wheelchair Mobility    Modified Rankin (Stroke Patients Only)       Balance Overall balance assessment: Needs assistance Sitting-balance support: Bilateral upper extremity supported;Feet supported Sitting  balance-Leahy Scale: Fair Sitting balance - Comments: pt sat edge of bed x 6 minutes, performed forward reaching, no loss of balance   Standing balance support: Bilateral upper extremity supported Standing balance-Leahy Scale: Zero Standing balance comment: reliant on BUE support and external support, posterior and L lateral lean in standing with RW                            Cognition Arousal/Alertness: Awake/alert Behavior During Therapy: Flat affect Overall Cognitive Status: No family/caregiver present to determine baseline cognitive functioning Area of Impairment: Safety/judgement;Problem solving                         Safety/Judgement: Decreased awareness of deficits;Decreased awareness of safety   Problem Solving: Slow processing;Decreased initiation;Difficulty sequencing;Requires verbal cues        Exercises General Exercises - Lower Extremity Ankle Circles/Pumps: AROM;Both;15 reps;Supine Gluteal Sets: AROM;Both;5 reps;Supine Short Arc Quad: AROM;Both;10 reps Long Arc Quad: AROM;Right;Left;5 reps Heel Slides: Right;Left;10 reps;AAROM;Supine Hip ABduction/ADduction: AAROM;Both;10 reps;Supine    General Comments        Pertinent Vitals/Pain Faces Pain Scale: Hurts little more Pain Location: R thigh and hip Pain Descriptors / Indicators: Grimacing Pain Intervention(s): Limited activity within patient's tolerance;Monitored during session;Repositioned    Home Living  Prior Function            PT Goals (current goals can now be found in the care plan section) Acute Rehab PT Goals Patient Stated Goal: play golf PT Goal Formulation: With patient/family Time For Goal Achievement: 05/05/19 Potential to Achieve Goals: Fair Progress towards PT goals: Progressing toward goals    Frequency    Min 2X/week      PT Plan Current plan remains appropriate    Co-evaluation              AM-PAC PT "6 Clicks"  Mobility   Outcome Measure  Help needed turning from your back to your side while in a flat bed without using bedrails?: Total Help needed moving from lying on your back to sitting on the side of a flat bed without using bedrails?: Total Help needed moving to and from a bed to a chair (including a wheelchair)?: Total Help needed standing up from a chair using your arms (e.g., wheelchair or bedside chair)?: Total Help needed to walk in hospital room?: Total Help needed climbing 3-5 steps with a railing? : Total 6 Click Score: 6    End of Session Equipment Utilized During Treatment: Gait belt Activity Tolerance: Patient limited by fatigue Patient left: in chair;with call bell/phone within reach;with family/visitor present;with nursing/sitter in room Nurse Communication: Mobility status;Need for lift equipment PT Visit Diagnosis: Other abnormalities of gait and mobility (R26.89);Muscle weakness (generalized) (M62.81)     Time: [id number] PT Time Calculation (min) (ACUTE ONLY): 40 min  Charges:  $Therapeutic Exercise: 8-22 mins $Therapeutic Activity: 23-37 mins                    Blondell Reveal Kistler PT 04/28/2019  Acute Rehabilitation Services Pager [phone] Office [phone]

## 2019-04-28 NOTE — Progress Notes (Signed)
     Progress Note    ASSESSMENT AND PLAN:   85 yo male with acute upper GI bleeding on asa and Lovenox. Bleeding associated with hemodynamic instability. Transferred to [facility] yesterday ([facility]). Responded to fluid resuscitation . Received 2 uPRBC yesterday, hgb 7.3 >>> 8.2 today.  -EGD today, keep NPO -Continue PPI gtt    SUBJECTIVE   No bleeding this am. Feels okay   OBJECTIVE:     Vital signs in last 24 hours: Temp:  [97.4 F (36.3 C)-99.1 F (37.3 C)] 98.3 F (36.8 C) (11/05 0800) Pulse Rate:  [58-75] 68 (11/05 0800) Resp:  [12-21] 15 (11/05 0800) BP: (67-128)/(41-76) 107/52 (11/05 0800) SpO2:  [95 %-100 %] 97 % (11/05 0800) Last BM Date: 04/27/19 General:   Alert, well-developed male in NAD Heart:  Regular rate Pulm: Normal respiratory effort Abdomen:  Soft, nondistended, nontender.  Normal bowel sounds.          Neurologic:  Alert and  oriented x4;  grossly normal neurologically. Psych:  Pleasant, cooperative.  Normal mood and affect.   Intake/Output from previous day: 11/04 0701 - 11/05 0700 In: 2739.1 [I.V.:1040.9; Blood:590; IV Piggyback:1108.2] Out: 376 [Urine:375; Stool:1] Intake/Output this shift: No intake/output data recorded.  Lab Results: Recent Labs    04/26/19 0509 04/27/19 1104 04/27/19 2116 04/28/19 0405  WBC 10.6*  --  16.1* 12.1*  HGB 10.4* 7.3* 9.8* 8.2*  HCT 31.1* 22.6* 28.7* 24.8*  PLT 308  --  239 213   BMET Recent Labs    04/26/19 0509 04/27/19 0317 04/28/19 0405  NA 138  --  138  K 4.0  --  4.2  CL 101  --  108  CO2 26  --  23  GLUCOSE 88  --  107*  BUN 20  --  49*  CREATININE 1.07 1.01 1.13  CALCIUM 8.4*  --  7.5*    Principal Problem:   MSSA bacteremia Active Problems:   Hypothyroidism   HYPERCHOLESTEROLEMIA   Cellulitis of right leg   Dehydration   Generalized weakness     LOS: 8 days   Tye Savoy ,NP 04/28/2019, 9:52 AM

## 2019-04-28 NOTE — Transfer of Care (Signed)
Immediate Anesthesia Transfer of Care Note  Patient: Zachary Decker  Procedure(s) Performed: ESOPHAGOGASTRODUODENOSCOPY (EGD) WITH PROPOFOL (N/A ) BIOPSY  Patient Location: Endoscopy Unit  Anesthesia Type:MAC  Level of Consciousness: awake, alert  and oriented  Airway & Oxygen Therapy: Patient Spontanous Breathing  Post-op Assessment: Report given to RN, Post -op Vital signs reviewed and stable and Patient moving all extremities  Post vital signs: Reviewed and stable  Last Vitals:  Vitals Value Taken Time  BP    Temp    Pulse    Resp    SpO2      Last Pain:  Vitals:   04/28/19 1306  TempSrc: Oral  PainSc: 0-No pain         Complications: No apparent anesthesia complications

## 2019-04-28 NOTE — Plan of Care (Signed)

## 2019-04-28 NOTE — Op Note (Addendum)
[facility] Patient Name: Zachary Decker Procedure Date : 04/28/2019 MRN: [id number] Attending MD: Thornton Park MD, MD Date of Birth: [birth year] CSN: [id number] Age: 85 Admit Type: Inpatient Procedure:                Upper GI endoscopy Indications:              Acute post hemorrhagic anemia, Melena Providers:                Thornton Park MD, MD, Ashley Jacobs, RN, Elspeth Cho Tech., Technician Referring MD:              Medicines:                Monitored Anesthesia Care Complications:            No immediate complications. Estimated blood loss:                            Minimal. Estimated Blood Loss:     Estimated blood loss was minimal. Procedure:                Pre-Anesthesia Assessment:                           - Prior to the procedure, a History and Physical                            was performed, and patient medications and                            allergies were reviewed. The patient's tolerance of                            previous anesthesia was also reviewed. The risks                            and benefits of the procedure and the sedation                            options and risks were discussed with the patient.                            All questions were answered, and informed consent                            was obtained. Prior Anticoagulants: The patient has                            taken Lovenox (enoxaparin), last dose was 1 day                            prior to procedure. ASA Grade Assessment: III - A  patient with severe systemic disease. After                            reviewing the risks and benefits, the patient was                            deemed in satisfactory condition to undergo the                            procedure.                           After obtaining informed consent, the endoscope was                            passed under direct vision. Throughout  the                            procedure, the patient's blood pressure, pulse, and                            oxygen saturations were monitored continuously. The                            [id number] Olympus gastroscope was                            introduced through the mouth, and advanced to the                            third part of duodenum. The upper GI endoscopy was                            accomplished without difficulty. The patient                            tolerated the procedure well. Scope In: Scope Out: Findings:      The esophagus was normal.      The entire examined stomach was normal. Biopsies were taken with a cold       forceps for histology. Estimated blood loss was minimal. No blood or       hematin present in the stomach.      One non-bleeding cratered duodenal ulcer with adherent clot was found in       the duodenal bulb. The lesion was 12 mm in largest dimension. No heaped       margins. No blood or hematin present in the duodenum.      The cardia and gastric fundus were normal on retroflexion. Impression:               - Normal esophagus.                           - Normal stomach. Biopsied.                           -  Non-bleeding duodenal ulcer with adherent clot.                            No ongoing bleeding. Recommendation:           - Return patient to hospital ward for ongoing care.                           - Clear liquid diet today. No red liquids, please.                           - Continue present medications including IV PPI.                           - Continue to hold Lovenox for at least 72 hours.                           - Await pathology results to exclude H Pylori.                           - Avoid all NSAIDs.                           - Repeat upper endoscopy in 8 weeks to check                            healing.                           I was unable to reach family using the phone number                            on  record or when calling the patient's room to                            review these results and my recommendations. Procedure Code(s):        --- Professional ---                           [phone], Esophagogastroduodenoscopy, flexible,                            transoral; with biopsy, single or multiple Diagnosis Code(s):        --- Professional ---                           K26.4, Chronic or unspecified duodenal ulcer with                            hemorrhage                           D62, Acute posthemorrhagic anemia                           K92.1, Melena (includes Hematochezia)  CPT copyright 2019 [employer] Association. All rights reserved. The codes documented in this report are preliminary and upon coder review may  be revised to meet current compliance requirements. Thornton Park MD, MD 04/28/2019 1:46:28 PM This report has been signed electronically. Number of Addenda: 0

## 2019-04-28 NOTE — Progress Notes (Signed)
Patient ID: Zachary Decker, male   DOB: 19-Apr-1933, 85 y.o.   MRN: [id number]  PROGRESS NOTE    GARALD BUONOCORE  [id number] DOB: 04/27/1933 DOA: 04/20/2019 PCP: [name], MD   Brief Narrative:  85 year old male with history of hypothyroidism, left leg cellulitis in April 2020 presented with worsening pain and discomfort in his right leg associated with difficulty in ambulating.  He was found to have sepsis secondary to MSSA bacteremia with right lower extremity cellulitis.  ID has recommended 2 weeks of IV Ancef till 05/05/2019.  Assessment & Plan:   Probable upper GI bleeding causing melena and bloody stools Acute blood loss anemia -Patient had many episodes of melena and bloody stools on 04/27/2019.  Hemoglobin dropped to 7.3.  DC'd aspirin and Lovenox.  Started iv Protonix. -GI was consulted. -Status post 2 units packed red cells transfusion on 04/27/2019.  Hemoglobin 8.2 today.  Monitor H&H. -Probable EGD by GI today.  Leukocytosis -Probably reactive from above.  Monitor.  Improving.  Sepsis: Present on admission MSSA bacteremia Right lower extremity cellulitis -1 out of 2 blood cultures was positive with MSSA, was on IV Rocephin which was subsequently switched to IV Ancef -ID recommended 2 weeks of IV Ancef till 05/05/2019.  Patient has a PICC line. -2D echo with normal EF and no vegetations.  Continue right leg elevation -PT recommended SNF: Family and patient chose to go home with home health services instead  Acute kidney injury -Due to sepsis.  Resolved  Generalized weakness Gait imbalance at home -Suspect movement disorder at baseline.  Will need outpatient neurology evaluation  Symmetric dilatation of the lateral ventricles -Unchanged from prior CT head imaging.  Cannot rule out NPH.  Outpatient neuro eval  Hypothyroidism -Continue Synthroid  Hyperlipidemia -Continue statin  Chronic kidney disease stage III -Baseline creatinine around 1.3.  This  is stable.  DVT prophylaxis: Lovenox Code Status: Full Family Communication: spoke to wife/Rachel on phone on 04/27/2019 Disposition Plan: home in 1-2 days once cleared by GI  Consultants: GI/ID  Procedures: None  Antimicrobials:  Anti-infectives (From admission, onward)   Start     Dose/Rate Route Frequency Ordered Stop   04/26/19 0000  ceFAZolin (ANCEF) IVPB     2 g Intravenous Every 8 hours 04/26/19 0915     04/22/19 1700  ceFAZolin (ANCEF) IVPB 2g/100 mL premix     2 g 200 mL/hr over 30 Minutes Intravenous Every 8 hours 04/22/19 1600     04/20/19 2000  cefTRIAXone (ROCEPHIN) 2 g in sodium chloride 0.9 % 100 mL IVPB  Status:  Discontinued     2 g 200 mL/hr over 30 Minutes Intravenous Every 24 hours 04/20/19 1956 04/22/19 1600   04/20/19 1715  vancomycin (VANCOCIN) IVPB 1000 mg/200 mL premix     1,000 mg 200 mL/hr over 60 Minutes Intravenous  Once 04/20/19 1704 04/20/19 1953      Subjective: Patient seen and examined at bedside.  Denies worsening shortness of breath, cough, chest pain or fever.  As per nursing staff, he had few episodes of black/bloody stools yesterday during the day and afternoon.  No overnight black or bloody bowel movements. Objective: Vitals:   04/28/19 0428 04/28/19 0500 04/28/19 0639 04/28/19 0700  BP:  (!) 85/54 112/60 (!) 109/56  Pulse:  64 73 75  Resp:  15 (!) 21 17  Temp: 98.4 F (36.9 C)     TempSrc: Oral     SpO2:  98% 98% 98%  Weight:  Height:        Intake/Output Summary (Last 24 hours) at 04/28/2019 0738 Last data filed at 04/28/2019 0336 Gross per 24 hour  Intake 2739.06 ml  Output 376 ml  Net 2363.06 ml   Filed Weights   04/20/19 1645  Weight: 83.9 kg    Examination:  General exam: No distress.  Looks chronically ill Respiratory system: Bilateral decreased breath sounds at bases with no wheezing.  Scattered crackles  cardiovascular system: Rate controlled, S1-S2 heard Gastrointestinal system: Abdomen is nondistended,  soft and nontender. Normal bowel sounds heard. Extremities: No cyanosis; right lower extremity erythema and mild tenderness present   Data Reviewed: I have personally reviewed following labs and imaging studies  CBC: Recent Labs  Lab 04/23/19 0331 04/24/19 0346 04/26/19 0509 04/27/19 1104 04/27/19 2116 04/28/19 0405  WBC 12.7* 10.7* 10.6*  --  16.1* 12.1*  NEUTROABS  --   --   --   --   --  8.6*  HGB 10.9* 10.7* 10.4* 7.3* 9.8* 8.2*  HCT 32.9* 31.8* 31.1* 22.6* 28.7* 24.8*  MCV 102.5* 101.6* 102.6*  --  96.6 98.4  PLT 234 253 308  --  239 [id number]   Basic Metabolic Panel: Recent Labs  Lab 04/22/19 0455 04/23/19 0331 04/26/19 0509 04/27/19 0317 04/28/19 0405  NA 139 138 138  --  138  K 4.3 4.7 4.0  --  4.2  CL 108 106 101  --  108  CO2 23 25 26   --  23  GLUCOSE 105* 100* 88  --  107*  BUN 24* 20 20  --  49*  CREATININE 1.12 1.14 1.07 1.01 1.13  CALCIUM 8.4* 8.6* 8.4*  --  7.5*  MG  --   --   --   --  1.9   GFR: Estimated Creatinine Clearance: 57.6 mL/min (by C-G formula based on SCr of 1.13 mg/dL). Liver Function Tests: No results for input(s): AST, ALT, ALKPHOS, BILITOT, PROT, ALBUMIN in the last 168 hours. No results for input(s): LIPASE, AMYLASE in the last 168 hours. No results for input(s): AMMONIA in the last 168 hours. Coagulation Profile: No results for input(s): INR, PROTIME in the last 168 hours. Cardiac Enzymes: No results for input(s): CKTOTAL, CKMB, CKMBINDEX, TROPONINI in the last 168 hours. BNP (last 3 results) No results for input(s): PROBNP in the last 8760 hours. HbA1C: No results for input(s): HGBA1C in the last 72 hours. CBG: No results for input(s): GLUCAP in the last 168 hours. Lipid Profile: No results for input(s): CHOL, HDL, LDLCALC, TRIG, CHOLHDL, LDLDIRECT in the last 72 hours. Thyroid Function Tests: No results for input(s): TSH, T4TOTAL, FREET4, T3FREE, THYROIDAB in the last 72 hours. Anemia Panel: No results for input(s):  VITAMINB12, FOLATE, FERRITIN, TIBC, IRON, RETICCTPCT in the last 72 hours. Sepsis Labs: No results for input(s): PROCALCITON, LATICACIDVEN in the last 168 hours.  Recent Results (from the past 240 hour(s))  Culture, blood (Routine X 2) w Reflex to ID Panel     Status: None   Collection Time: 04/20/19  4:45 PM   Specimen: Site Not Specified; Blood  Result Value Ref Range Status   Specimen Description SITE NOT SPECIFIED  Final   Special Requests   Final    BOTTLES DRAWN AEROBIC AND ANAEROBIC Blood Culture adequate volume   Culture   Final    NO GROWTH 5 DAYS Performed at [facility], [address], [place], [place] [address]    Report Status 04/25/2019 FINAL  Final  SARS CORONAVIRUS 2 (TAT 6-24 HRS) Nasopharyngeal Nasopharyngeal Swab     Status: None   Collection Time: 04/20/19  5:05 PM   Specimen: Nasopharyngeal Swab  Result Value Ref Range Status   SARS Coronavirus 2 NEGATIVE NEGATIVE Final    Comment: (NOTE) SARS-CoV-2 target nucleic acids are NOT DETECTED. The SARS-CoV-2 RNA is generally detectable in upper and lower respiratory specimens during the acute phase of infection. Negative results do not preclude SARS-CoV-2 infection, do not rule out co-infections with other pathogens, and should not be used as the sole basis for treatment or other patient management decisions. Negative results must be combined with clinical observations, patient history, and epidemiological information. The expected result is Negative. Fact Sheet for Patients: SugarRoll.be Fact Sheet for Healthcare Providers: https://www.woods-mathews.com/ This test is not yet approved or cleared by the [place] FDA and  has been authorized for detection and/or diagnosis of SARS-CoV-2 by FDA under an Emergency Use Authorization (EUA). This EUA will remain  in effect (meaning this test can be used) for the duration of the COVID-19 declaration under Section 56  4(b)(1) of the Act, 21 U.S.C. section 360bbb-3(b)(1), unless the authorization is terminated or revoked sooner. Performed at [facility], [place] [address] [place], [place] [address]   Culture, blood (Routine X 2) w Reflex to ID Panel     Status: Abnormal   Collection Time: 04/20/19  5:37 PM   Specimen: BLOOD RIGHT HAND  Result Value Ref Range Status   Specimen Description BLOOD RIGHT HAND  Final   Special Requests   Final    BOTTLES DRAWN AEROBIC AND ANAEROBIC Blood Culture adequate volume   Culture  Setup Time   Final    GRAM POSITIVE COCCI AEROBIC BOTTLE ONLY CRITICAL RESULT CALLED TO, READ BACK BY AND VERIFIED WITH: [place] [id number] AT 1024 BY CM Performed at [facility], [place] [address] [place], [place] [address]    Culture STAPHYLOCOCCUS AUREUS (A)  Final   Report Status 04/23/2019 FINAL  Final   Organism ID, Bacteria STAPHYLOCOCCUS AUREUS  Final      Susceptibility   Staphylococcus aureus - MIC*    CIPROFLOXACIN <=0.5 SENSITIVE Sensitive     ERYTHROMYCIN <=0.25 SENSITIVE Sensitive     GENTAMICIN <=0.5 SENSITIVE Sensitive     OXACILLIN 0.5 SENSITIVE Sensitive     TETRACYCLINE <=1 SENSITIVE Sensitive     VANCOMYCIN <=0.5 SENSITIVE Sensitive     TRIMETH/SULFA <=10 SENSITIVE Sensitive     CLINDAMYCIN <=0.25 SENSITIVE Sensitive     RIFAMPIN <=0.5 SENSITIVE Sensitive     Inducible Clindamycin NEGATIVE Sensitive     * STAPHYLOCOCCUS AUREUS  MRSA PCR Screening     Status: None   Collection Time: 04/21/19  4:25 AM   Specimen: Nasal Mucosa; Nasopharyngeal  Result Value Ref Range Status   MRSA by PCR NEGATIVE NEGATIVE Final    Comment:        The GeneXpert MRSA Assay (FDA approved for NASAL specimens only), is one component of a comprehensive MRSA colonization surveillance program. It is not intended to diagnose MRSA infection nor to guide or monitor treatment for MRSA infections. Performed at [facility], [place] [address] [place],  [place] [address]   Culture, blood (routine x 2)     Status: None   Collection Time: 04/22/19  4:30 PM   Specimen: BLOOD LEFT ARM  Result Value Ref Range Status   Specimen Description BLOOD LEFT ARM  Final  Special Requests   Final    BOTTLES DRAWN AEROBIC AND ANAEROBIC Blood Culture adequate volume   Culture   Final    NO GROWTH 5 DAYS Performed at [facility], [place] [address] [place], [place] [address]    Report Status 04/27/2019 FINAL  Final  Culture, blood (routine x 2)     Status: None   Collection Time: 04/22/19  4:35 PM   Specimen: BLOOD LEFT ARM  Result Value Ref Range Status   Specimen Description BLOOD LEFT ARM  Final   Special Requests   Final    BOTTLES DRAWN AEROBIC AND ANAEROBIC Blood Culture adequate volume   Culture   Final    NO GROWTH 5 DAYS Performed at [facility], [place] [address] [place], [place] [address]    Report Status 04/27/2019 FINAL  Final         Radiology Studies: No results found.      Scheduled Meds: . sodium chloride   Intravenous Once  . carvedilol  3.125 mg Oral BID WC  . levothyroxine  50 mcg Oral QAC breakfast  . [START ON 05/01/2019] pantoprazole  40 mg Intravenous Q12H  . simvastatin  20 mg Oral QHS  . sodium chloride flush  10-40 mL Intracatheter Q12H  . sodium chloride flush  3 mL Intravenous Q12H  . tamsulosin  0.4 mg Oral QHS   Continuous Infusions: . sodium chloride 100 mL/hr at 04/28/19 0304  .  ceFAZolin (ANCEF) IV 2 g (04/28/19 [id number])  . pantoprozole (PROTONIX) infusion 8 mg/hr (04/28/19 0034)          Aline August, MD [facility] 04/28/2019, 7:38 AM

## 2019-04-29 ENCOUNTER — Encounter (HOSPITAL_COMMUNITY): Payer: Self-pay | Admitting: Gastroenterology

## 2019-04-29 LAB — CBC WITH DIFFERENTIAL/PLATELET
Abs Immature Granulocytes: 0.4 10*3/uL — ABNORMAL HIGH (ref 0.00–0.07)
Basophils Absolute: 0.1 10*3/uL (ref 0.0–0.1)
Basophils Relative: 0 %
Eosinophils Absolute: 0.5 10*3/uL (ref 0.0–0.5)
Eosinophils Relative: 4 %
HCT: 22 % — ABNORMAL LOW (ref 39.0–52.0)
Hemoglobin: 7.1 g/dL — ABNORMAL LOW (ref 13.0–17.0)
Immature Granulocytes: 3 %
Lymphocytes Relative: 15 %
Lymphs Abs: 1.8 10*3/uL (ref 0.7–4.0)
MCH: 32.9 pg (ref 26.0–34.0)
MCHC: 32.3 g/dL (ref 30.0–36.0)
MCV: 101.9 fL — ABNORMAL HIGH (ref 80.0–100.0)
Monocytes Absolute: 1.1 10*3/uL — ABNORMAL HIGH (ref 0.1–1.0)
Monocytes Relative: 9 %
Neutro Abs: 8 10*3/uL — ABNORMAL HIGH (ref 1.7–7.7)
Neutrophils Relative %: 69 %
Platelets: 221 10*3/uL (ref 150–400)
RBC: 2.16 MIL/uL — ABNORMAL LOW (ref 4.22–5.81)
RDW: 15.9 % — ABNORMAL HIGH (ref 11.5–15.5)
WBC: 11.7 10*3/uL — ABNORMAL HIGH (ref 4.0–10.5)
nRBC: 0.3 % — ABNORMAL HIGH (ref 0.0–0.2)

## 2019-04-29 LAB — BASIC METABOLIC PANEL
Anion gap: 5 (ref 5–15)
BUN: 36 mg/dL — ABNORMAL HIGH (ref 8–23)
CO2: 23 mmol/L (ref 22–32)
Calcium: 7.7 mg/dL — ABNORMAL LOW (ref 8.9–10.3)
Chloride: 111 mmol/L (ref 98–111)
Creatinine, Ser: 1.02 mg/dL (ref 0.61–1.24)
GFR calc Af Amer: >60 mL/min (ref >60)
GFR calc non Af Amer: >60 mL/min (ref >60)
Glucose, Bld: 93 mg/dL (ref 70–99)
Potassium: 3.9 mmol/L (ref 3.5–5.1)
Sodium: 139 mmol/L (ref 135–145)

## 2019-04-29 LAB — SURGICAL PATHOLOGY

## 2019-04-29 LAB — HEMOGLOBIN AND HEMATOCRIT, BLOOD
HCT: 24.4 % — ABNORMAL LOW (ref 39.0–52.0)
Hemoglobin: 7.7 g/dL — ABNORMAL LOW (ref 13.0–17.0)

## 2019-04-29 LAB — MAGNESIUM: Magnesium: 1.7 mg/dL (ref 1.7–2.4)

## 2019-04-29 NOTE — TOC Progression Note (Signed)
Transition of Care [facility]) - Progression Note    Patient Details  Name: LINNELL LUEDEMAN MRN: [id number] Date of Birth: 1932-09-15  Transition of Care [facility]) CM/SW [place], LCSW Phone Number: 04/29/2019, 4:40 PM  Clinical Narrative:    CSW alerted [facility] and [facility] that patient may be able to discharge home tomorrow.    Expected Discharge Plan: [place] Barriers to Discharge: Continued Medical Work up  Expected Discharge Plan and Services Expected Discharge Plan: [place] In-house Referral: Clinical Social Work     Living arrangements for the past 2 months: Single Family Home Expected Discharge Date: 04/26/19                         HH Arranged: PT [place]: [facility] Date [place]: 04/22/19 Time [place]: 58 Representative spoke with at [place]: [place] ([place]) Interventions    Readmission Risk Interventions Readmission Risk Prevention Plan 10/09/2018  Transportation Screening Complete  PCP or Specialist Appt within 5-7 Days Complete  Home Care Screening Complete  Medication Review (RN CM) Complete  Some recent data might be hidden

## 2019-04-29 NOTE — Progress Notes (Signed)
Patient ID: Zachary Decker, male   DOB: [birth year], 85 y.o.   MRN: [id number]  PROGRESS NOTE    Zachary Decker  [id number] DOB: 1932/12/29 DOA: 04/20/2019 PCP: [name], MD   Brief Narrative:  85 year old male with history of hypothyroidism, left leg cellulitis in April 2020 presented with worsening pain and discomfort in his right leg associated with difficulty in ambulating.  He was found to have sepsis secondary to MSSA bacteremia with right lower extremity cellulitis.  ID has recommended 2 weeks of IV Ancef till 05/05/2019. Patient had episodes of melena and bloody stools on 04/27/2019 with drop in hemoglobin.  He required 2 units packed red cells transfusion.  GI was consulted.  Assessment & Plan:   Probable upper GI bleeding causing melena and bloody stools Acute blood loss anemia -Patient had many episodes of melena and bloody stools on 04/27/2019.  Hemoglobin dropped to 7.3.  DC'd aspirin and Lovenox.  Started iv Protonix. -GI was consulted. -Status post 2 units packed red cells transfusion on 04/27/2019.  Hemoglobin 7.1 today.  Monitor H&H.  Transfuse if hemoglobin drops to less than 7 or 8 days evidence of active bleeding -Status post EGD on 04/28/2019 by GI which showed duodenal ulcer with adherent clot.  Continue PPI for now.  Follow further GI recommendations  Leukocytosis -Probably reactive from above.  Monitor.  Improving.  Sepsis: Present on admission MSSA bacteremia Right lower extremity cellulitis -1 out of 2 blood cultures was positive with MSSA, was on IV Rocephin which was subsequently switched to IV Ancef -ID recommended 2 weeks of IV Ancef till 05/05/2019.  Patient has a PICC line. -2D echo with normal EF and no vegetations.  Continue right leg elevation -PT recommended SNF: Family and patient chose to go home with home health services instead  Acute kidney injury -Due to sepsis.  Resolved  Generalized weakness Gait imbalance at home -Suspect  movement disorder at baseline.  Will need outpatient neurology evaluation  Symmetric dilatation of the lateral ventricles -Unchanged from prior CT head imaging.  Cannot rule out NPH.  Outpatient neuro eval  Hypothyroidism -Continue Synthroid  Hyperlipidemia -Continue statin  Chronic kidney disease stage III -Baseline creatinine around 1.3.  This is stable.  DVT prophylaxis: Lovenox Code Status: Full Family Communication: spoke to wife/Rachel on phone on 04/27/2019 Disposition Plan: home tomorrow if cleared by GI and hemoglobin remains stable.  Consultants: GI/ID  Procedures:  EGD on 04/28/2019 Impression:               - Normal esophagus.                           - Normal stomach. Biopsied.                           - Non-bleeding duodenal ulcer with adherent clot.                            No ongoing bleeding. Recommendation:           - Return patient to hospital ward for ongoing care.                           - Clear liquid diet today. No red liquids, please.                           -  Continue present medications including IV PPI.                           - Continue to hold Lovenox for at least 72 hours.                           - Await pathology results to exclude H Pylori.                           - Avoid all NSAIDs.                           - Repeat upper endoscopy in 8 weeks to check                            healing.                           I was unable to reach family using the phone number                            on record or when calling the patient's room to                            review these results and my recommendations.  Antimicrobials:  Anti-infectives (From admission, onward)   Start     Dose/Rate Route Frequency Ordered Stop   04/26/19 0000  ceFAZolin (ANCEF) IVPB     2 g Intravenous Every 8 hours 04/26/19 0915     04/22/19 1700  ceFAZolin (ANCEF) IVPB 2g/100 mL premix     2 g 200 mL/hr over 30 Minutes Intravenous Every 8 hours  04/22/19 1600     04/20/19 2000  cefTRIAXone (ROCEPHIN) 2 g in sodium chloride 0.9 % 100 mL IVPB  Status:  Discontinued     2 g 200 mL/hr over 30 Minutes Intravenous Every 24 hours 04/20/19 1956 04/22/19 1600   04/20/19 1715  vancomycin (VANCOCIN) IVPB 1000 mg/200 mL premix     1,000 mg 200 mL/hr over 60 Minutes Intravenous  Once 04/20/19 1704 04/20/19 1953      Subjective: Patient seen and examined at bedside.  Denies any overnight fever, nausea, vomiting, abdominal pain, black or bloody bowel movements.   Objective: Vitals:   04/28/19 2022 04/29/19 0031 04/29/19 0032 04/29/19 0353  BP:   (!) 103/56 (!) 111/59  Pulse:   70 72  Resp:   16 17  Temp: 98 F (36.7 C) 98.1 F (36.7 C)  98.3 F (36.8 C)  TempSrc:    Oral  SpO2:   98% 97%  Weight:      Height:        Intake/Output Summary (Last 24 hours) at 04/29/2019 0736 Last data filed at 04/29/2019 0700 Gross per 24 hour  Intake 2289.39 ml  Output 1375 ml  Net 914.39 ml   Filed Weights   04/20/19 1645  Weight: 83.9 kg    Examination:  General exam: No acute distress.  Looks chronically ill Respiratory system: Bilateral decreased breath sounds at bases with some scattered crackles  cardiovascular system: S1-S2 heard, rate controlled Gastrointestinal system: Abdomen is nondistended, soft and  nontender. Normal bowel sounds heard. Extremities: No cyanosis; right lower extremity erythema and mild tenderness present.  Bilateral lower extremity pitting edema present   Data Reviewed: I have personally reviewed following labs and imaging studies  CBC: Recent Labs  Lab 04/24/19 0346 04/26/19 0509 04/27/19 1104 04/27/19 2116 04/28/19 0405 04/29/19 0441  WBC 10.7* 10.6*  --  16.1* 12.1* 11.7*  NEUTROABS  --   --   --   --  8.6* 8.0*  HGB 10.7* 10.4* 7.3* 9.8* 8.2* 7.1*  HCT 31.8* 31.1* 22.6* 28.7* 24.8* 22.0*  MCV 101.6* 102.6*  --  96.6 98.4 101.9*  PLT 253 308  --  239 213 [id number]   Basic Metabolic Panel: Recent Labs   Lab 04/23/19 0331 04/26/19 0509 04/27/19 0317 04/28/19 0405 04/29/19 0441  NA 138 138  --  138 139  K 4.7 4.0  --  4.2 3.9  CL 106 101  --  108 111  CO2 25 26  --  23 23  GLUCOSE 100* 88  --  107* 93  BUN 20 20  --  49* 36*  CREATININE 1.14 1.07 1.01 1.13 1.02  CALCIUM 8.6* 8.4*  --  7.5* 7.7*  MG  --   --   --  1.9 1.7   GFR: Estimated Creatinine Clearance: 63.8 mL/min (by C-G formula based on SCr of 1.02 mg/dL). Liver Function Tests: No results for input(s): AST, ALT, ALKPHOS, BILITOT, PROT, ALBUMIN in the last 168 hours. No results for input(s): LIPASE, AMYLASE in the last 168 hours. No results for input(s): AMMONIA in the last 168 hours. Coagulation Profile: No results for input(s): INR, PROTIME in the last 168 hours. Cardiac Enzymes: No results for input(s): CKTOTAL, CKMB, CKMBINDEX, TROPONINI in the last 168 hours. BNP (last 3 results) No results for input(s): PROBNP in the last 8760 hours. HbA1C: No results for input(s): HGBA1C in the last 72 hours. CBG: No results for input(s): GLUCAP in the last 168 hours. Lipid Profile: No results for input(s): CHOL, HDL, LDLCALC, TRIG, CHOLHDL, LDLDIRECT in the last 72 hours. Thyroid Function Tests: No results for input(s): TSH, T4TOTAL, FREET4, T3FREE, THYROIDAB in the last 72 hours. Anemia Panel: No results for input(s): VITAMINB12, FOLATE, FERRITIN, TIBC, IRON, RETICCTPCT in the last 72 hours. Sepsis Labs: No results for input(s): PROCALCITON, LATICACIDVEN in the last 168 hours.  Recent Results (from the past 240 hour(s))  Culture, blood (Routine X 2) w Reflex to ID Panel     Status: None   Collection Time: 04/20/19  4:45 PM   Specimen: Site Not Specified; Blood  Result Value Ref Range Status   Specimen Description SITE NOT SPECIFIED  Final   Special Requests   Final    BOTTLES DRAWN AEROBIC AND ANAEROBIC Blood Culture adequate volume   Culture   Final    NO GROWTH 5 DAYS Performed at [facility], [address] [place], [place] [address]    Report Status 04/25/2019 FINAL  Final  SARS CORONAVIRUS 2 (TAT 6-24 HRS) Nasopharyngeal Nasopharyngeal Swab     Status: None   Collection Time: 04/20/19  5:05 PM   Specimen: Nasopharyngeal Swab  Result Value Ref Range Status   SARS Coronavirus 2 NEGATIVE NEGATIVE Final    Comment: (NOTE) SARS-CoV-2 target nucleic acids are NOT DETECTED. The SARS-CoV-2 RNA is generally detectable in upper and lower respiratory specimens during the acute phase of infection. Negative results do not preclude SARS-CoV-2 infection, do not rule out co-infections with other  pathogens, and should not be used as the sole basis for treatment or other patient management decisions. Negative results must be combined with clinical observations, patient history, and epidemiological information. The expected result is Negative. Fact Sheet for Patients: SugarRoll.be Fact Sheet for Healthcare Providers: https://www.woods-mathews.com/ This test is not yet approved or cleared by the [place] FDA and  has been authorized for detection and/or diagnosis of SARS-CoV-2 by FDA under an Emergency Use Authorization (EUA). This EUA will remain  in effect (meaning this test can be used) for the duration of the COVID-19 declaration under Section 56 4(b)(1) of the Act, 21 U.S.C. section 360bbb-3(b)(1), unless the authorization is terminated or revoked sooner. Performed at [facility], [place] [address] [place], [place] [address]   Culture, blood (Routine X 2) w Reflex to ID Panel     Status: Abnormal   Collection Time: 04/20/19  5:37 PM   Specimen: BLOOD RIGHT HAND  Result Value Ref Range Status   Specimen Description BLOOD RIGHT HAND  Final   Special Requests   Final    BOTTLES DRAWN AEROBIC AND ANAEROBIC Blood Culture adequate volume   Culture  Setup Time   Final    GRAM POSITIVE COCCI AEROBIC BOTTLE ONLY CRITICAL RESULT CALLED TO, READ  BACK BY AND VERIFIED WITH: [place] [id number] AT 1024 BY CM Performed at [facility], [place] [address] [place], [place] [address]    Culture STAPHYLOCOCCUS AUREUS (A)  Final   Report Status 04/23/2019 FINAL  Final   Organism ID, Bacteria STAPHYLOCOCCUS AUREUS  Final      Susceptibility   Staphylococcus aureus - MIC*    CIPROFLOXACIN <=0.5 SENSITIVE Sensitive     ERYTHROMYCIN <=0.25 SENSITIVE Sensitive     GENTAMICIN <=0.5 SENSITIVE Sensitive     OXACILLIN 0.5 SENSITIVE Sensitive     TETRACYCLINE <=1 SENSITIVE Sensitive     VANCOMYCIN <=0.5 SENSITIVE Sensitive     TRIMETH/SULFA <=10 SENSITIVE Sensitive     CLINDAMYCIN <=0.25 SENSITIVE Sensitive     RIFAMPIN <=0.5 SENSITIVE Sensitive     Inducible Clindamycin NEGATIVE Sensitive     * STAPHYLOCOCCUS AUREUS  MRSA PCR Screening     Status: None   Collection Time: 04/21/19  4:25 AM   Specimen: Nasal Mucosa; Nasopharyngeal  Result Value Ref Range Status   MRSA by PCR NEGATIVE NEGATIVE Final    Comment:        The GeneXpert MRSA Assay (FDA approved for NASAL specimens only), is one component of a comprehensive MRSA colonization surveillance program. It is not intended to diagnose MRSA infection nor to guide or monitor treatment for MRSA infections. Performed at [facility], [place] [address], [place], [place] [address]   Culture, blood (routine x 2)     Status: None   Collection Time: 04/22/19  4:30 PM   Specimen: BLOOD LEFT ARM  Result Value Ref Range Status   Specimen Description BLOOD LEFT ARM  Final   Special Requests   Final    BOTTLES DRAWN AEROBIC AND ANAEROBIC Blood Culture adequate volume   Culture   Final    NO GROWTH 5 DAYS Performed at [facility], [address], [place], [place] [address]    Report Status 04/27/2019 FINAL  Final  Culture, blood (routine x 2)     Status: None   Collection Time: 04/22/19  4:35 PM   Specimen: BLOOD LEFT ARM  Result Value Ref Range Status   Specimen  Description BLOOD LEFT ARM  Final   Special Requests   Final    BOTTLES DRAWN AEROBIC AND ANAEROBIC Blood Culture adequate volume   Culture   Final    NO GROWTH 5 DAYS Performed at [facility], [address] [place], [place] [address]    Report Status 04/27/2019 FINAL  Final         Radiology Studies: No results found.      Scheduled Meds: . sodium chloride   Intravenous Once  . carvedilol  3.125 mg Oral BID WC  . levothyroxine  50 mcg Oral QAC breakfast  . [START ON 05/01/2019] pantoprazole  40 mg Intravenous Q12H  . simvastatin  20 mg Oral QHS  . sodium chloride flush  10-40 mL Intracatheter Q12H  . sodium chloride flush  3 mL Intravenous Q12H  . tamsulosin  0.4 mg Oral QHS   Continuous Infusions: . sodium chloride 100 mL/hr at 04/29/19 0351  .  ceFAZolin (ANCEF) IV 2 g (04/29/19 [id number])  . pantoprozole (PROTONIX) infusion 8 mg/hr (04/29/19 0736)          Aline August, MD [facility] 04/29/2019, 7:36 AM

## 2019-04-29 NOTE — Anesthesia Postprocedure Evaluation (Signed)
Anesthesia Post Note  Patient: Zachary Decker  Procedure(s) Performed: ESOPHAGOGASTRODUODENOSCOPY (EGD) WITH PROPOFOL (N/A ) BIOPSY     Patient location during evaluation: Endoscopy Anesthesia Type: MAC Level of consciousness: awake and alert Pain management: pain level controlled Vital Signs Assessment: post-procedure vital signs reviewed and stable Respiratory status: spontaneous breathing, nonlabored ventilation, respiratory function stable and patient connected to nasal cannula oxygen Cardiovascular status: blood pressure returned to baseline and stable Postop Assessment: no apparent nausea or vomiting Anesthetic complications: no    Last Vitals:  Vitals:   04/29/19 0032 04/29/19 0353  BP: (!) 103/56 (!) 111/59  Pulse: 70 72  Resp: 16 17  Temp:  36.8 C  SpO2: 98% 97%    Last Pain:  Vitals:   04/29/19 0353  TempSrc: Oral  PainSc:                  Nyellie Yetter DANIEL

## 2019-04-29 NOTE — Progress Notes (Signed)
     Progress Note    ASSESSMENT AND PLAN:   85 yo male with hemodynamically unstable acute upper GI bleeding on asa and Lovenox. EGD >>> non-bleeding cratered duodenal ulcer with clot. Biopsies pending. Received 2 [facility] 04/27/19. Hgb improved but today down to 7.1.  No overt bleeding in two days.  -Repeat H+H at noon, hopefully will remain > 7 -Follow up EGD in 8 weeks. Given advanced age I will arrange for follow up visit in office first - our office will call him with the appointment.   -Will notify patient when ulcer pathology available.  -Home on BID PPI until follow up EGD -Recommend oral iron supplements at discharge. Add stool softener to avoid constipation.     SUBJECTIVE    No complaints. No further GI bleeding. Doesn't feel weak nor SOB   OBJECTIVE:     Vital signs in last 24 hours: Temp:  [98 F (36.7 C)-99 F (37.2 C)] 99 F (37.2 C) (11/06 0817) Pulse Rate:  [65-74] 74 (11/06 0817) Resp:  [15-21] 15 (11/06 0817) BP: (90-143)/(28-82) 119/55 (11/06 0817) SpO2:  [96 %-100 %] 97 % (11/06 0817) Last BM Date: 04/27/19 General:   Alert, well developed male in NAD Heart:  Regular rate and rhythm;  No lower extremity edema   Pulm: Normal respiratory effort Abdomen:  Soft, nondistended, nontender.  Normal bowel sounds.          Neurologic:  Alert and  oriented x4;  grossly normal neurologically. Psych:  Pleasant, cooperative.  Normal mood and affect.   Intake/Output from previous day: 11/05 0701 - 11/06 0700 In: 2289.4 [P.O.:300; I.V.:1834.7; IV Piggyback:154.7] Out: 1375 [Urine:1375] Intake/Output this shift: No intake/output data recorded.  Lab Results: Recent Labs    04/27/19 2116 04/28/19 0405 04/29/19 0441  WBC 16.1* 12.1* 11.7*  HGB 9.8* 8.2* 7.1*  HCT 28.7* 24.8* 22.0*  PLT 239 213 221   BMET Recent Labs    04/27/19 0317 04/28/19 0405 04/29/19 0441  NA  --  138 139  K  --  4.2 3.9  CL  --  108 111  CO2  --  23 23  GLUCOSE  --  107* 93   BUN  --  49* 36*  CREATININE 1.01 1.13 1.02  CALCIUM  --  7.5* 7.7*    Principal Problem:   MSSA bacteremia Active Problems:   Hypothyroidism   HYPERCHOLESTEROLEMIA   Cellulitis of right leg   Dehydration   Generalized weakness   Duodenal ulcer     LOS: 9 days   Tye Savoy ,NP 04/29/2019, 9:10 AM

## 2019-04-30 LAB — CBC WITH DIFFERENTIAL/PLATELET
Abs Immature Granulocytes: 0.32 10*3/uL — ABNORMAL HIGH (ref 0.00–0.07)
Basophils Absolute: 0.1 10*3/uL (ref 0.0–0.1)
Basophils Relative: 0 %
Eosinophils Absolute: 0.5 10*3/uL (ref 0.0–0.5)
Eosinophils Relative: 4 %
HCT: 21.4 % — ABNORMAL LOW (ref 39.0–52.0)
Hemoglobin: 7 g/dL — ABNORMAL LOW (ref 13.0–17.0)
Immature Granulocytes: 3 %
Lymphocytes Relative: 17 %
Lymphs Abs: 2 10*3/uL (ref 0.7–4.0)
MCH: 33.3 pg (ref 26.0–34.0)
MCHC: 32.7 g/dL (ref 30.0–36.0)
MCV: 101.9 fL — ABNORMAL HIGH (ref 80.0–100.0)
Monocytes Absolute: 1.1 10*3/uL — ABNORMAL HIGH (ref 0.1–1.0)
Monocytes Relative: 9 %
Neutro Abs: 8.3 10*3/uL — ABNORMAL HIGH (ref 1.7–7.7)
Neutrophils Relative %: 67 %
Platelets: 235 10*3/uL (ref 150–400)
RBC: 2.1 MIL/uL — ABNORMAL LOW (ref 4.22–5.81)
RDW: 15.3 % (ref 11.5–15.5)
WBC: 12.2 10*3/uL — ABNORMAL HIGH (ref 4.0–10.5)
nRBC: 0.5 % — ABNORMAL HIGH (ref 0.0–0.2)

## 2019-04-30 LAB — BASIC METABOLIC PANEL
Anion gap: 7 (ref 5–15)
BUN: 28 mg/dL — ABNORMAL HIGH (ref 8–23)
CO2: 22 mmol/L (ref 22–32)
Calcium: 7.9 mg/dL — ABNORMAL LOW (ref 8.9–10.3)
Chloride: 108 mmol/L (ref 98–111)
Creatinine, Ser: 1.14 mg/dL (ref 0.61–1.24)
GFR calc Af Amer: >60 mL/min (ref >60)
GFR calc non Af Amer: 59 mL/min — ABNORMAL LOW (ref >60)
Glucose, Bld: 93 mg/dL (ref 70–99)
Potassium: 3.5 mmol/L (ref 3.5–5.1)
Sodium: 137 mmol/L (ref 135–145)

## 2019-04-30 LAB — PREPARE RBC (CROSSMATCH)

## 2019-04-30 LAB — MAGNESIUM: Magnesium: 1.8 mg/dL (ref 1.7–2.4)

## 2019-04-30 MED ORDER — PANTOPRAZOLE SODIUM 40 MG PO TBEC: 40.0000 mg | DELAYED_RELEASE_TABLET | Freq: Two times a day (BID) | ORAL | 1 refills | Status: DC

## 2019-04-30 MED ORDER — SODIUM CHLORIDE 0.9% IV SOLUTION
Freq: Once | INTRAVENOUS | Status: AC
  Administered 2019-04-30: 09:00:00 via INTRAVENOUS

## 2019-04-30 MED ORDER — FERROUS SULFATE 325 (65 FE) MG PO TBEC: 325.0000 mg | DELAYED_RELEASE_TABLET | Freq: Two times a day (BID) | ORAL | 0 refills | Status: DC

## 2019-04-30 MED ORDER — CEFAZOLIN IV (FOR PTA / DISCHARGE USE ONLY): 2.0000 g | Freq: Three times a day (TID) | INTRAVENOUS | 0 refills | Status: AC

## 2019-04-30 MED ORDER — PANTOPRAZOLE SODIUM 40 MG PO TBEC
40.0000 mg | DELAYED_RELEASE_TABLET | Freq: Two times a day (BID) | ORAL | Status: DC
  Administered 2019-04-30: 40 mg via ORAL

## 2019-04-30 NOTE — Discharge Summary (Signed)
Physician Discharge Summary  SAL SPRATLEY EPP:[id number] DOB: [birth year] DOA: 04/20/2019  PCP: [name], MD  Admit date: 04/20/2019 Discharge date: 04/30/2019  Admitted From: Home Disposition: Home.  Patient refused SNF placement.  Recommendations for Outpatient Follow-up:  1. Follow up with PCP in 1 week with repeat CBC/BMP 2. Outpatient follow-up with GI. 3. Outpatient follow-up with ID if needed 4. Outpatient follow-up with neurology 5. Follow up in ED if symptoms worsen or new appear   Home Health: Home health PT Equipment/Devices: PICC line  Discharge Condition: Stable CODE STATUS: Full Diet recommendation: Heart healthy  Brief/Interim Summary: 85 year old male with history of hypothyroidism, left leg cellulitis in April 2020 presented with worsening pain and discomfort in his right leg associated with difficulty in ambulating.  He was found to have sepsis secondary to MSSA bacteremia with right lower extremity cellulitis.  ID has recommended 2 weeks of IV Ancef till 05/05/2019. Patient had episodes of melena and bloody stools on 04/27/2019 with drop in hemoglobin.  He required 2 units packed red cells transfusion.  GI was consulted.  He underwent EGD on 04/29/2019 which showed duodenal ulcer with adherent clot.  Treated with PPI.  No further episodes of melena or hematochezia.  GI recommends outpatient follow-up and to continue oral PPI twice daily.  Hemoglobin is 7 this morning.  Will transfuse 1 unit packed red cells and subsequently discharge the patient home.  Discharge Diagnoses:   Probable upper GI bleeding causing melena and bloody stools Acute blood loss anemia -Patient had many episodes of melena and bloody stools on 04/27/2019.  Hemoglobin dropped to 7.3.  DC'd aspirin and Lovenox.  Started iv Protonix. -GI was consulted. -Status post 2 units packed red cells transfusion on 04/27/2019.  Hemoglobin 7.1 today.  Monitor H&H.  Transfuse if hemoglobin drops to  less than 7 or 8 days evidence of active bleeding -Status post EGD on 04/28/2019 by GI which showed duodenal ulcer with adherent clot.   -GI recommends to continue oral PPI twice a day for 8 weeks and possible repeat EGD afterwards.  We will also start iron supplement as per GI recommendations.  Patient needs to avoid constipation.  Outpatient follow-up with GI. -Hemoglobin 7 today but no evidence of hematemesis or melena.  Will transfuse 1 unit packed red cells and subsequently discharge the patient afterwards. -We will not restart aspirin on discharge.  Leukocytosis -Probably reactive from above.    Stable.  Sepsis: Present on admission MSSA bacteremia Right lower extremity cellulitis -1 out of 2 blood cultures was positive with MSSA, was on IV Rocephin which was subsequently switched to IV Ancef -ID recommended 2 weeks of IV Ancef till 05/05/2019.  Patient has a PICC line.  DC PICC line after antibiotic completion.  Outpatient ID follow-up if needed. -2D echo with normal EF and no vegetations.  Continue right leg elevation -PT recommended SNF: Family and patient chose to go home with home health services instead  Acute kidney injury -Due to sepsis.  Resolved  Generalized weakness Gait imbalance at home -Suspect movement disorder at baseline.  Will need outpatient neurology evaluation  Symmetric dilatation of the lateral ventricles -Unchanged from prior CT head imaging.  Cannot rule out NPH.  Outpatient neuro eval  Hypothyroidism -Continue Synthroid  Hyperlipidemia -Continue statin  Chronic kidney disease stage III -Baseline creatinine around 1.3.  This is stable.  Discharge Instructions  Discharge Instructions    Ambulatory referral to Gastroenterology   Complete by: As directed  Diet - low sodium heart healthy   Complete by: As directed    Diet - low sodium heart healthy   Complete by: As directed    Home infusion instructions [facility] May follow [place] Dosing Protocol; May administer Cathflo as needed to maintain patency of vascular access device.; Flushing of vascular access device: per [facility] Protocol: 0.9% NaCl pre/post medica...   Complete by: As directed    Instructions: May follow [place] Dosing Protocol   Instructions: May administer Cathflo as needed to maintain patency of vascular access device.   Instructions: Flushing of vascular access device: per [facility] Protocol: 0.9% NaCl pre/post medication administration and prn patency; Heparin 100 u/ml, 64m for implanted ports and Heparin 10u/ml, 528mfor all other central venous catheters.   Instructions: May follow [facility] Anaphylaxis Protocol for First Dose Administration in the home: 0.9% NaCl at 25-50 ml/hr to maintain IV access for protocol meds. Epinephrine 0.3 ml IV/IM PRN and Benadryl 25-50 IV/IM PRN s/s of anaphylaxis.   Instructions: AdBerksnfusion Coordinator (RN) to assist per patient IV care needs in the home PRN.   Home infusion instructions [facility] May follow [facility] [place] Protocol; May administer Cathflo as needed to maintain patency of vascular access device.; Flushing of vascular access device: [facility]: 0.9% NaCl pre/post medica...   Complete by: As directed    Instructions: May follow [facility] Protocol   Instructions: May administer Cathflo as needed to maintain patency of vascular access device.   Instructions: [place]ushing of vascular access device: [facility]: 0.9% NaCl pre/post medication administration and prn patency; Heparin 100 u/ml, 59m30mor implanted ports and Heparin 10u/ml, 59ml60mr all other central venous catheters.   Instructions: May follow [facility] Anaphylaxis Protocol for First Dose Administration in the home: 0.9% NaCl at 25-50 ml/hr to maintain IV access for protocol meds. Epinephrine 0.3 ml IV/IM PRN and Benadryl 25-50 IV/IM PRN s/s of anaphylaxis.   Instructions: Adva[place]ickson Cityusion Coordinator (RN) to  assist per patient IV care needs in the home PRN.   Increase activity slowly   Complete by: As directed    Increase activity slowly   Complete by: As directed      Allergies as of 04/30/2019      Reactions   Losartan Potassium Other (See Comments)   Not known   Naproxen Nausea And Vomiting   Penicillins Rash   Did it involve swelling of the face/tongue/throat, SOB, or low BP? No Did it involve sudden or severe rash/hives, skin peeling, or any reaction on the inside of your mouth or nose? Yes Did you need to seek medical attention at a hospital or doctor's office? Unknown When did it last happen? If all above answers are "NO", may proceed with cephalosporin use. TOLERATED CEFTRIAXONE 10/07/2018      Medication List    STOP taking these medications   aspirin 81 MG tablet   lisinopril 2.5 MG tablet Commonly known as: ZESTRIL     TAKE these medications   carvedilol 3.125 MG tablet Commonly known as: COREG TAKE 1 TABLET TWICE A DAY WITH FOOD What changed: additional instructions   ceFAZolin  IVPB Commonly known as: ANCEF Inject 2 g into the vein every 8 (eight) hours. Indication: bacteremia Last Day of Therapy:  05/05/19 Labs - Once weekly:  CBC/D and BMP, Labs - Every other week:  ESR and CRP   ceFAZolin  IVPB Commonly known as: ANCEF Inject  2 g into the vein every 8 (eight) hours for 5 days. Indication:  MSSA bacteremia Last Day of Therapy:  05/05/2019 Labs - Once weekly:  CBC/D and BMP, Labs - Every other week:  ESR and CRP   Centrum Silver 50+Men Tabs Take 1 tablet by mouth daily.   diclofenac sodium 1 % Gel Commonly known as: VOLTAREN APPLY 2 GRAMS TO AFFECTED AREA 4 TIMES A DAY What changed: See the new instructions.   famotidine 20 MG tablet Commonly known as: PEPCID Take 20 mg by mouth as needed for heartburn or indigestion.   ferrous sulfate 325 (65 FE) MG EC tablet Take 1 tablet (325 mg total) by mouth 2 (two) times daily.   Folbee Plus  Tabs TAKE 1 TABLET BY MOUTH EVERY DAY   levothyroxine 50 MCG tablet Commonly known as: SYNTHROID TAKE 1 TABLET BY MOUTH DAILY BEFORE BREAKFAST What changed: See the new instructions.   nitroGLYCERIN 0.4 MG SL tablet Commonly known as: NITROSTAT Place 1 tablet (0.4 mg total) under the tongue every 5 (five) minutes as needed for chest pain.   pantoprazole 40 MG tablet Commonly known as: PROTONIX Take 1 tablet (40 mg total) by mouth 2 (two) times daily.   polyethylene glycol 17 g packet Commonly known as: MIRALAX / GLYCOLAX Take 17 g by mouth daily as needed for mild constipation.   simvastatin 20 MG tablet Commonly known as: ZOCOR TAKE 1 TABLET BY MOUTH EVERYDAY AT BEDTIME What changed: See the new instructions.   tamsulosin 0.4 MG Caps capsule Commonly known as: FLOMAX TAKE 1 CAPSULE BY MOUTH EVERY DAY What changed: additional instructions   traZODone 50 MG tablet Commonly known as: DESYREL TAKE 0.5-1 TABLETS (25-50 MG TOTAL) BY MOUTH AT BEDTIME AS NEEDED FOR SLEEP.   TYLENOL EXTRA STRENGTH PO Take 1-2 tablets by mouth daily as needed (for back pain).   vitamin B-12 500 MCG tablet Commonly known as: CYANOCOBALAMIN Take 500 mcg by mouth daily.            Home Infusion Instuctions  (From admission, onward)         Start     Ordered   04/30/19 0000  Home infusion instructions [facility] May follow [place] Dosing Protocol; May administer Cathflo as needed to maintain patency of vascular access device.; Flushing of vascular access device: per [facility] Protocol: 0.9% NaCl pre/post medica...    Question Answer Comment  Instructions May follow [place] Dosing Protocol   Instructions May administer Cathflo as needed to maintain patency of vascular access device.   Instructions Flushing of vascular access device: per [facility] Protocol: 0.9% NaCl pre/post medication administration and prn patency; Heparin 100 u/ml, 16m for implanted ports and Heparin 10u/ml, 564mfor  all other central venous catheters.   Instructions May follow [facility] Anaphylaxis Protocol for First Dose Administration in the home: 0.9% NaCl at 25-50 ml/hr to maintain IV access for protocol meds. Epinephrine 0.3 ml IV/IM PRN and Benadryl 25-50 IV/IM PRN s/s of anaphylaxis.   Instructions [facility] Infusion Coordinator (RN) to assist per patient IV care needs in the home PRN.      04/30/19 0940   04/26/19 0000  Home infusion instructions [facility] May follow [facility] Protocol; May administer Cathflo as needed to maintain patency of vascular access device.; Flushing of vascular access device: per [facility]: 0.9% NaCl pre/post medica...    Question Answer Comment  Instructions May follow [facility] Protocol  Instructions May administer Cathflo as needed to maintain patency of vascular access device.   Instructions Flushing of vascular access device: per [facility] Protocol: 0.9% NaCl pre/post medication administration and prn patency; Heparin 100 u/ml, 64m for implanted ports and Heparin 10u/ml, 568mfor all other central venous catheters.   Instructions May follow [facility] Anaphylaxis Protocol for First Dose Administration in the home: 0.9% NaCl at 25-50 ml/hr to maintain IV access for protocol meds. Epinephrine 0.3 ml IV/IM PRN and Benadryl 25-50 IV/IM PRN s/s of anaphylaxis.   Instructions [facility] Infusion Coordinator (RN) to assist per patient IV care needs in the home PRN.      04/26/19 0915         Follow-up Information    HuMarin OlpMD. Schedule an appointment as soon as possible for a visit in 1 week(s).   Specialty: Family Medicine Why: With repeat CBC/BMP Contact information: [phone]        Allergies  Allergen Reactions  . Losartan Potassium Other (See Comments)    Not known  . Naproxen Nausea And Vomiting  . Penicillins Rash    Did it involve swelling of the  face/tongue/throat, SOB, or low BP? No Did it involve sudden or severe rash/hives, skin peeling, or any reaction on the inside of your mouth or nose? Yes Did you need to seek medical attention at a hospital or doctor's office? Unknown When did it last happen? If all above answers are "NO", may proceed with cephalosporin use.  TOLERATED CEFTRIAXONE 10/07/2018    Consultations:  ID/GI   Procedures/Studies: Ct Head Wo Contrast  Result Date: 04/20/2019 CLINICAL DATA:  Difficulty with ambulation and balance. Generalized weakness. Probable right lower extremity cellulitis. No reported injury. EXAM: CT HEAD WITHOUT CONTRAST TECHNIQUE: Contiguous axial images were obtained from the base of the skull through the vertex without intravenous contrast. COMPARISON:  10/07/2018 head CT. FINDINGS: Brain: No evidence of parenchymal hemorrhage or extra-axial fluid collection. No mass lesion, mass effect, or midline shift. No CT evidence of acute infarction. Nonspecific mild subcortical and periventricular white matter hypodensity, most in keeping with chronic small vessel ischemic change. Generalized cerebral volume loss. There is symmetric dilatation of the lateral ventricles bilaterally, similar to prior. Vascular: No acute abnormality. Skull: No evidence of calvarial fracture. Sinuses/Orbits: The visualized paranasal sinuses are essentially clear. Other:  The mastoid air cells are unopacified. IMPRESSION: 1.  No evidence of acute intracranial abnormality. 2. Symmetric dilatation of the lateral ventricles, similar to prior head CT. While potentially due to central cerebral volume loss, the lateral ventricular dilatation appears somewhat out of proportion to the generalized cerebral volume loss, and normal pressure hydrocephalus cannot be strictly excluded. Neurology consultation could be considered, as clinically warranted. 3. Mild chronic small vessel ischemic changes in the cerebral white matter.  Electronically Signed   By: JaIlona Sorrel.D.   On: 04/20/2019 13:57   Dg Foot 2 Views Right  Result Date: 04/25/2019 CLINICAL DATA:  Right toe injury. Evaluate for osteomyelitis. Five months of right swelling. EXAM: RIGHT FOOT - 2 VIEW COMPARISON:  None. FINDINGS: Mild hallux valgus deformity. Minimal degenerate change over the first MTP joint and over the interphalangeal joints. No acute fracture or dislocation. Mild degenerate changes over the midfoot. Small inferior calcaneal spur. No bone destruction to suggest osteomyelitis. IMPRESSION: No acute findings.  No evidence of osteomyelitis. Mild hallux valgus deformity.  Mild degenerate changes as described. Electronically Signed  By: [name] M.D.   On: 04/25/2019 16:27   [place] Ekg Site Rite  Result Date: 04/25/2019 If Site Rite image not attached, placement could not be confirmed due to current cardiac rhythm.   EGD on 04/28/2019 Impression: - Normal esophagus. - Normal stomach. Biopsied. - Non-bleeding duodenal ulcer with adherent clot.  No ongoing bleeding. Recommendation: - Return patient to hospital ward for ongoing care. - Clear liquid diet today. No red liquids, please. - Continue present medications including IV PPI. - Continue to hold Lovenox for at least 72 hours. - Await pathology results to exclude H Pylori. - Avoid all NSAIDs. - Repeat upper endoscopy in 8 weeks to check  healing. I was unable to reach family using the phone number  on record or when calling the patient's room to  review these results and my recommendations.  Subjective: Patient seen and examined at  bedside.  He denies any overnight fever, nausea, vomiting, black or bloody stools.  He feels okay to go home today.  Discharge Exam: Vitals:   04/30/19 0818 04/30/19 0833  BP: 111/65 111/68  Pulse: 64 64  Resp: 18 18  Temp: 98.7 F (37.1 C) 98.3 F (36.8 C)  SpO2: 97% 96%    General: Pt is alert, awake, not in acute distress.  Looks chronically ill. Cardiovascular: rate controlled, S1/S2 + Respiratory: bilateral decreased breath sounds at bases with some scattered crackles Abdominal: Soft, NT, ND, bowel sounds + Extremities: Trace lower extremity edema, more on his right side; no cyanosis    The results of significant diagnostics from this hospitalization (including imaging, microbiology, ancillary and laboratory) are listed below for reference.     Microbiology: Recent Results (from the past 240 hour(s))  Culture, blood (Routine X 2) w Reflex to ID Panel     Status: None   Collection Time: 04/20/19  4:45 PM   Specimen: Site Not Specified; Blood  Result Value Ref Range Status   Specimen Description SITE NOT SPECIFIED  Final   Special Requests   Final    BOTTLES DRAWN AEROBIC AND ANAEROBIC Blood Culture adequate volume   Culture   Final    NO GROWTH 5 DAYS Performed at [facility], [address] [place], [place] [address]    Report Status 04/25/2019 FINAL  Final  SARS CORONAVIRUS 2 (TAT 6-24 HRS) Nasopharyngeal Nasopharyngeal Swab     Status: None   Collection Time: 04/20/19  5:05 PM   Specimen: Nasopharyngeal Swab  Result Value Ref Range Status   SARS Coronavirus 2 NEGATIVE NEGATIVE Final    Comment: (NOTE) SARS-CoV-2 target nucleic acids are NOT DETECTED. The SARS-CoV-2 RNA is generally detectable in upper and lower respiratory specimens during the acute phase of infection. Negative results do not preclude SARS-CoV-2 infection, do not rule out co-infections with other pathogens, and should not be used as the sole basis for treatment or other patient  management decisions. Negative results must be combined with clinical observations, patient history, and epidemiological information. The expected result is Negative. Fact Sheet for Patients: SugarRoll.be Fact Sheet for Healthcare Providers: https://www.woods-mathews.com/ This test is not yet approved or cleared by the [place] FDA and  has been authorized for detection and/or diagnosis of SARS-CoV-2 by FDA under an Emergency Use Authorization (EUA). This EUA will remain  in effect (meaning this test can be used) for the duration of the COVID-19 declaration under Section 56 4(b)(1) of the Act, 21 U.S.C. section 360bbb-3(b)(1), unless the authorization is terminated or  revoked sooner. Performed at [facility], [place] [address], [place], [place] [address]   Culture, blood (Routine X 2) w Reflex to ID Panel     Status: Abnormal   Collection Time: 04/20/19  5:37 PM   Specimen: BLOOD RIGHT HAND  Result Value Ref Range Status   Specimen Description BLOOD RIGHT HAND  Final   Special Requests   Final    BOTTLES DRAWN AEROBIC AND ANAEROBIC Blood Culture adequate volume   Culture  Setup Time   Final    GRAM POSITIVE COCCI AEROBIC BOTTLE ONLY CRITICAL RESULT CALLED TO, READ BACK BY AND VERIFIED WITH: [place] [id number] AT 1024 BY CM Performed at [facility], [place] [address], [place], [place] [address]    Culture STAPHYLOCOCCUS AUREUS (A)  Final   Report Status 04/23/2019 FINAL  Final   Organism ID, Bacteria STAPHYLOCOCCUS AUREUS  Final      Susceptibility   Staphylococcus aureus - MIC*    CIPROFLOXACIN <=0.5 SENSITIVE Sensitive     ERYTHROMYCIN <=0.25 SENSITIVE Sensitive     GENTAMICIN <=0.5 SENSITIVE Sensitive     OXACILLIN 0.5 SENSITIVE Sensitive     TETRACYCLINE <=1 SENSITIVE Sensitive     VANCOMYCIN <=0.5 SENSITIVE Sensitive     TRIMETH/SULFA <=10 SENSITIVE Sensitive     CLINDAMYCIN <=0.25 SENSITIVE Sensitive      RIFAMPIN <=0.5 SENSITIVE Sensitive     Inducible Clindamycin NEGATIVE Sensitive     * STAPHYLOCOCCUS AUREUS  MRSA PCR Screening     Status: None   Collection Time: 04/21/19  4:25 AM   Specimen: Nasal Mucosa; Nasopharyngeal  Result Value Ref Range Status   MRSA by PCR NEGATIVE NEGATIVE Final    Comment:        The GeneXpert MRSA Assay (FDA approved for NASAL specimens only), is one component of a comprehensive MRSA colonization surveillance program. It is not intended to diagnose MRSA infection nor to guide or monitor treatment for MRSA infections. Performed at [facility], [place] [address], [place], [place] [address]   Culture, blood (routine x 2)     Status: None   Collection Time: 04/22/19  4:30 PM   Specimen: BLOOD LEFT ARM  Result Value Ref Range Status   Specimen Description BLOOD LEFT ARM  Final   Special Requests   Final    BOTTLES DRAWN AEROBIC AND ANAEROBIC Blood Culture adequate volume   Culture   Final    NO GROWTH 5 DAYS Performed at [facility], [address], [place], [place] [address]    Report Status 04/27/2019 FINAL  Final  Culture, blood (routine x 2)     Status: None   Collection Time: 04/22/19  4:35 PM   Specimen: BLOOD LEFT ARM  Result Value Ref Range Status   Specimen Description BLOOD LEFT ARM  Final   Special Requests   Final    BOTTLES DRAWN AEROBIC AND ANAEROBIC Blood Culture adequate volume   Culture   Final    NO GROWTH 5 DAYS Performed at [facility], [place] [address] [place],  [address]    Report Status 04/27/2019 FINAL  Final     Labs: BNP (last 3 results) No results for input(s): BNP in the last 8760 hours. Basic Metabolic Panel: Recent Labs  Lab 04/26/19 0509 04/27/19 0317 04/28/19 0405 04/29/19 0441 04/30/19 0403  NA 138  --  138 139 137  K 4.0  --  4.2 3.9 3.5  CL 101  --  108 111 108  CO2 26  --  _0 GLUCOSE 88  --  107* 93 93  BUN 20  --  49* 36* 28*  CREATININE 1.07 1.01 1.13 1.02 1.14   CALCIUM 8.4*  --  7.5* 7.7* 7.9*  MG  --   --  1.9 1.7 1.8   Liver Function Tests: No results for input(s): AST, ALT, ALKPHOS, BILITOT, PROT, ALBUMIN in the last 168 hours. No results for input(s): LIPASE, AMYLASE in the last 168 hours. No results for input(s): AMMONIA in the last 168 hours. CBC: Recent Labs  Lab 04/26/19 0509  04/27/19 2116 04/28/19 0405 04/29/19 0441 04/29/19 1759 04/30/19 0403  WBC 10.6*  --  16.1* 12.1* 11.7*  --  12.2*  NEUTROABS  --   --   --  8.6* 8.0*  --  8.3*  HGB 10.4*   < > 9.8* 8.2* 7.1* 7.7* 7.0*  HCT 31.1*   < > 28.7* 24.8* 22.0* 24.4* 21.4*  MCV 102.6*  --  96.6 98.4 101.9*  --  101.9*  PLT 308  --  239 213 221  --  235   < > = values in this interval not displayed.   Cardiac Enzymes: No results for input(s): CKTOTAL, CKMB, CKMBINDEX, TROPONINI in the last 168 hours. BNP: Invalid input(s): POCBNP CBG: No results for input(s): GLUCAP in the last 168 hours. D-Dimer No results for input(s): DDIMER in the last 72 hours. Hgb A1c No results for input(s): HGBA1C in the last 72 hours. Lipid Profile No results for input(s): CHOL, HDL, LDLCALC, TRIG, CHOLHDL, LDLDIRECT in the last 72 hours. Thyroid function studies No results for input(s): TSH, T4TOTAL, T3FREE, THYROIDAB in the last 72 hours.  Invalid input(s): FREET3 Anemia work up No results for input(s): VITAMINB12, FOLATE, FERRITIN, TIBC, IRON, RETICCTPCT in the last 72 hours. Urinalysis    Component Value Date/Time   COLORURINE YELLOW 10/07/2018 1700   APPEARANCEUR CLEAR 10/07/2018 1700   LABSPEC 1.025 10/07/2018 1700   PHURINE 5.0 10/07/2018 1700   GLUCOSEU NEGATIVE 10/07/2018 1700   HGBUR NEGATIVE 10/07/2018 1700   BILIRUBINUR NEGATIVE 10/07/2018 1700   BILIRUBINUR n 08/10/2012 1612   KETONESUR NEGATIVE 10/07/2018 1700   PROTEINUR NEGATIVE 10/07/2018 1700   UROBILINOGEN 0.2 08/10/2012 1612   UROBILINOGEN 0.2 02/24/2012 0133   NITRITE NEGATIVE 10/07/2018 1700   LEUKOCYTESUR  NEGATIVE 10/07/2018 1700   Sepsis Labs Invalid input(s): PROCALCITONIN,  WBC,  LACTICIDVEN Microbiology Recent Results (from the past 240 hour(s))  Culture, blood (Routine X 2) w Reflex to ID Panel     Status: None   Collection Time: 04/20/19  4:45 PM   Specimen: Site Not Specified; Blood  Result Value Ref Range Status   Specimen Description SITE NOT SPECIFIED  Final   Special Requests   Final    BOTTLES DRAWN AEROBIC AND ANAEROBIC Blood Culture adequate volume   Culture   Final    NO GROWTH 5 DAYS Performed at [facility], [place] [address] [place], [place] [address]    Report Status 04/25/2019 FINAL  Final  SARS CORONAVIRUS 2 (TAT 6-24 HRS) Nasopharyngeal Nasopharyngeal Swab     Status: None   Collection Time: 04/20/19  5:05 PM   Specimen: Nasopharyngeal Swab  Result Value Ref Range Status   SARS Coronavirus 2 NEGATIVE NEGATIVE Final    Comment: (NOTE) SARS-CoV-2 target nucleic acids are NOT DETECTED. The SARS-CoV-2 RNA is generally detectable in upper and lower  respiratory specimens during the acute phase of infection. Negative results do not preclude SARS-CoV-2 infection, do not rule out co-infections with other pathogens, and should not be used as the sole basis for treatment or other patient management decisions. Negative results must be combined with clinical observations, patient history, and epidemiological information. The expected result is Negative. Fact Sheet for Patients: SugarRoll.be Fact Sheet for Healthcare Providers: https://www.woods-mathews.com/ This test is not yet approved or cleared by the [place] FDA and  has been authorized for detection and/or diagnosis of SARS-CoV-2 by FDA under an Emergency Use Authorization (EUA). This EUA will remain  in effect (meaning this test can be used) for the duration of the COVID-19 declaration under Section 56 4(b)(1) of the Act, 21 U.S.C. section 360bbb-3(b)(1),  unless the authorization is terminated or revoked sooner. Performed at [facility], [place] [address] [place], [place] [address]   Culture, blood (Routine X 2) w Reflex to ID Panel     Status: Abnormal   Collection Time: 04/20/19  5:37 PM   Specimen: BLOOD RIGHT HAND  Result Value Ref Range Status   Specimen Description BLOOD RIGHT HAND  Final   Special Requests   Final    BOTTLES DRAWN AEROBIC AND ANAEROBIC Blood Culture adequate volume   Culture  Setup Time   Final    GRAM POSITIVE COCCI AEROBIC BOTTLE ONLY CRITICAL RESULT CALLED TO, READ BACK BY AND VERIFIED WITH: Gr[place] [id number] AT 1024 BY CM Performed at [facility], [place] [address], [place], [place] [address]    Culture STAPHYLOCOCCUS AUREUS (A)  Final   Report Status 04/23/2019 FINAL  Final   Organism ID, Bacteria STAPHYLOCOCCUS AUREUS  Final      Susceptibility   Staphylococcus aureus - MIC*    CIPROFLOXACIN <=0.5 SENSITIVE Sensitive     ERYTHROMYCIN <=0.25 SENSITIVE Sensitive     GENTAMICIN <=0.5 SENSITIVE Sensitive     OXACILLIN 0.5 SENSITIVE Sensitive     TETRACYCLINE <=1 SENSITIVE Sensitive     VANCOMYCIN <=0.5 SENSITIVE Sensitive     TRIMETH/SULFA <=10 SENSITIVE Sensitive     CLINDAMYCIN <=0.25 SENSITIVE Sensitive     RIFAMPIN <=0.5 SENSITIVE Sensitive     Inducible Clindamycin NEGATIVE Sensitive     * STAPHYLOCOCCUS AUREUS  MRSA PCR Screening     Status: None   Collection Time: 04/21/19  4:25 AM   Specimen: Nasal Mucosa; Nasopharyngeal  Result Value Ref Range Status   MRSA by PCR NEGATIVE NEGATIVE Final    Comment:        The GeneXpert MRSA Assay (FDA approved for NASAL specimens only), is one component of a comprehensive MRSA colonization surveillance program. It is not intended to diagnose MRSA infection nor to guide or monitor treatment for MRSA infections. Performed at [facility], [place] [address] [place], [place] [address]   Culture, blood (routine x 2)     Status:  None   Collection Time: 04/22/19  4:30 PM   Specimen: BLOOD LEFT ARM  Result Value Ref Range Status   Specimen Description BLOOD LEFT ARM  Final   Special Requests   Final    BOTTLES DRAWN AEROBIC AND ANAEROBIC Blood Culture adequate volume   Culture   Final    NO GROWTH 5 DAYS Performed at [facility], [address], [place], [place] [address]    Report Status 04/27/2019 FINAL  Final  Culture, blood (routine x 2)     Status: None  Collection Time: 04/22/19  4:35 PM   Specimen: BLOOD LEFT ARM  Result Value Ref Range Status   Specimen Description BLOOD LEFT ARM  Final   Special Requests   Final    BOTTLES DRAWN AEROBIC AND ANAEROBIC Blood Culture adequate volume   Culture   Final    NO GROWTH 5 DAYS Performed at [facility], [address] [place], [place] [address]    Report Status 04/27/2019 FINAL  Final     Time coordinating discharge: 35 minutes  SIGNED:   Aline August, MD  [facility] 04/30/2019, 9:40 AM

## 2019-04-30 NOTE — Plan of Care (Signed)

## 2019-04-30 NOTE — TOC Transition Note (Addendum)
Transition of Care [facility]) - CM/SW Discharge Note   Patient Details  Name: Zachary Decker MRN: [id number] Date of Birth: Jun 27, 1932  Transition of Care [facility]) CM/SW Contact:  Carles Collet, RN Phone Number: 04/30/2019, 10:36 AM   Clinical Narrative:   Dorna Bloom from [facility] who states she been in coordination with Pam from [facility]) about plan for DC. [facility] will start home services tomorrow.  Spoke w [employer] whp confirms that teaching has been done w daughter. Will plan on afternoon dose being administered in the hospital, and the 10 pm dose done at home by daughter Olivia Mackie.  Pam states that meds have been delivered to house, and family and patient are in agreement with plan. No other CM needs identified at this time.  Spoke w family they are ready for patient to come home, called [facility] and he is on the list. Placed PT[facility] papers on chart, nurse aware.      Final next level of care: [place] Barriers to Discharge: No Barriers Identified   Patient Goals and CMS Choice Patient states their goals for this hospitalization and ongoing recovery are:: return home CMS Medicare.gov Compare Post Acute Care list provided to:: Patient Choice offered to / list presented to : Patient  Discharge Placement                       Discharge Plan and Services In-house Referral: Clinical Social Work                        HH Arranged: Therapist, sports, PT [facility] Agency: [facility] Date [place]: 04/30/19 Time [place]: 1036 Representative spoke with at [place]: [place] ([place]) Interventions     Readmission Risk Interventions Readmission Risk Prevention Plan 10/09/2018  Transportation Screening Complete  PCP or Specialist Appt within 5-7 Days Complete  Home Care Screening Complete  Medication Review (RN CM) Complete  Some recent data might be hidden

## 2019-04-30 NOTE — Progress Notes (Signed)
PHARMACY CONSULT NOTE FOR:  OUTPATIENT  PARENTERAL ANTIBIOTIC THERAPY (OPAT)  Indication: MSSA bacteremia  Regimen: Cefazolin 2g IV q8hr End date: 05/05/2019  IV antibiotic discharge orders are pended. To discharging provider:  please sign these orders via discharge navigator,  Select New Orders & click on the button choice - Manage This Unsigned Work.     Thank you for allowing pharmacy to be a part of this patient's care.  Agnes Lawrence, PharmD PGY1 Pharmacy Resident

## 2019-05-01 LAB — BPAM RBC
Blood Product Expiration Date: 202012052359
Blood Product Expiration Date: 202012082359
Blood Product Expiration Date: 202012112359
ISSUE DATE / TIME: 202011041502
ISSUE DATE / TIME: 202011041732
ISSUE DATE / TIME: 202011070814
Unit Type and Rh: 5100
Unit Type and Rh: 5100
Unit Type and Rh: 5100

## 2019-05-01 LAB — TYPE AND SCREEN
ABO/RH(D): O POS
Antibody Screen: NEGATIVE
Unit division: 0
Unit division: 0
Unit division: 0

## 2019-05-02 ENCOUNTER — Telehealth: Payer: Self-pay

## 2019-05-02 NOTE — Telephone Encounter (Signed)
NOted  Team- please see if patient is taking anything to help have bowel movement. If he is not- would try gentle regimen of half capful of miralax once a day mixed in water until visit with me. Can take stool softener like colace as well.

## 2019-05-02 NOTE — Telephone Encounter (Signed)
Transition Care Management Follow-up Telephone Call  Date of discharge and from where: [facility] 04/30/19  How have you been since you were released from the hospital? Stable; Wife does report decrease in appetite while on abt therapy  Any questions or concerns? Yes no bm since hospital discharge, and see above (advised wife to call if patient has not had bm by Wed 05/04/19)  Items Reviewed:  Did the pt receive and understand the discharge instructions provided? Yes   Medications obtained and verified? Yes   Any new allergies since your discharge? No   Dietary orders reviewed? Yes  Do you have support at home? Yes   Other (ie: DME, Home Health, etc) current services through [facility] last in home on 05/02/19  Functional Questionnaire: (I = Independent and D = Dependent) ADL's: D  Bathing/Dressing- I   Meal Prep- D  Eating- I  Maintaining continence- I  Transferring/Ambulation- D  Managing Meds- D   Follow up appointments reviewed:    PCP Hospital f/u appt confirmed? Yes  Scheduled to see Dr. Yong Channel on 05/06/19 @ 0920. (virtual visit)   Specialist Hospital f/u appt confirmed? Yes  Scheduled to see Dr. Tarri Glenn on 05/31/19 @ 1:50 for GI follow up .  Are transportation arrangements needed? No   If their condition worsens, is the pt aware to call  their PCP or go to the ED? Yes  Was the patient provided with contact information for the PCP's office or ED? Yes  Was the pt encouraged to call back with questions or concerns? Yes

## 2019-05-03 NOTE — Telephone Encounter (Signed)
Called and spoke with pt daugter and instructed her of doctor [facility] [place] recommendations. She states she thinks pt HR was 64 this morning and she will hold the carvedilol, the hospital took him off of lisinopril and I asked her to keep a log of pt bp and HR and you can review them at his visit on Friday. Pt voiced understanding.

## 2019-05-03 NOTE — Telephone Encounter (Signed)
If his HR is under 75- lets have him hold the carvedilol. Please confirm hes not on any other BP meds like lisinopril.

## 2019-05-03 NOTE — Telephone Encounter (Signed)
(  FYI) Called and spoke with pt daughter and informed her of the below. She informed me that the home health nurse was there and the pt BP has been hoovering around 92-96/50. She just wanted you to be aware.

## 2019-05-04 ENCOUNTER — Telehealth: Payer: Self-pay | Admitting: Family Medicine

## 2019-05-04 DIAGNOSIS — G729 Myopathy, unspecified: Secondary | ICD-10-CM

## 2019-05-04 NOTE — Telephone Encounter (Signed)
Home Health Verbal Orders - Caller/Agency: Coralyn Mark with [facility] Callback Number: [phone] Requesting OT/PT/Skilled Nursing/Social Work/Speech Therapy: PT Frequency: 2x a week for 4 weeks working on transfer, gait, and strength training

## 2019-05-04 NOTE — Telephone Encounter (Signed)
Returned [employer] call and lm on his vm with the OK for VO.

## 2019-05-04 NOTE — Telephone Encounter (Signed)
See note

## 2019-05-06 ENCOUNTER — Other Ambulatory Visit: Payer: Self-pay | Admitting: Family Medicine

## 2019-05-06 ENCOUNTER — Encounter: Payer: Self-pay | Admitting: Family Medicine

## 2019-05-06 ENCOUNTER — Ambulatory Visit (INDEPENDENT_AMBULATORY_CARE_PROVIDER_SITE_OTHER): Payer: Medicare Other | Admitting: Family Medicine

## 2019-05-06 VITALS — BP 108/57 | HR 68

## 2019-05-06 DIAGNOSIS — R531 Weakness: Secondary | ICD-10-CM

## 2019-05-06 DIAGNOSIS — R2689 Other abnormalities of gait and mobility: Secondary | ICD-10-CM

## 2019-05-06 DIAGNOSIS — B9561 Methicillin susceptible Staphylococcus aureus infection as the cause of diseases classified elsewhere: Secondary | ICD-10-CM

## 2019-05-06 DIAGNOSIS — I2510 Atherosclerotic heart disease of native coronary artery without angina pectoris: Secondary | ICD-10-CM

## 2019-05-06 DIAGNOSIS — I255 Ischemic cardiomyopathy: Secondary | ICD-10-CM | POA: Diagnosis not present

## 2019-05-06 DIAGNOSIS — N183 Chronic kidney disease, stage 3 unspecified: Secondary | ICD-10-CM

## 2019-05-06 DIAGNOSIS — N401 Enlarged prostate with lower urinary tract symptoms: Secondary | ICD-10-CM

## 2019-05-06 DIAGNOSIS — G4700 Insomnia, unspecified: Secondary | ICD-10-CM

## 2019-05-06 DIAGNOSIS — R7881 Bacteremia: Secondary | ICD-10-CM

## 2019-05-06 DIAGNOSIS — K269 Duodenal ulcer, unspecified as acute or chronic, without hemorrhage or perforation: Secondary | ICD-10-CM

## 2019-05-06 DIAGNOSIS — R338 Other retention of urine: Secondary | ICD-10-CM

## 2019-05-06 DIAGNOSIS — E034 Atrophy of thyroid (acquired): Secondary | ICD-10-CM

## 2019-05-06 DIAGNOSIS — I10 Essential (primary) hypertension: Secondary | ICD-10-CM

## 2019-05-06 DIAGNOSIS — L03115 Cellulitis of right lower limb: Secondary | ICD-10-CM

## 2019-05-06 DIAGNOSIS — E7800 Pure hypercholesterolemia, unspecified: Secondary | ICD-10-CM

## 2019-05-06 MED ORDER — PANTOPRAZOLE SODIUM 40 MG PO TBEC: 40.0000 mg | DELAYED_RELEASE_TABLET | Freq: Two times a day (BID) | ORAL | 1 refills | Status: DC

## 2019-05-06 NOTE — Assessment & Plan Note (Signed)
Patient with MSSA on 1 of 2 blood cultures initially drawn.  Has completed 2-week course of Ancef with plans to remove PICC line likely today-I signed order for home health yesterday.  Repeat blood cultures on 04/22/2019 were negative thankfully.  Patient had echocardiogram which did not show any vegetations.  Plan was for infectious disease follow-up per hospitalist note outpatient but this has not been scheduled-family would prefer not to follow-up unless absolutely necessary-I will message infectious disease.

## 2019-05-06 NOTE — Assessment & Plan Note (Signed)
Hospitalist recommended outpatient neurology follow-up for the balance issues as well as CT scan of the head and question of NPH/normal pressure hydrocephalus.  Patient also potentially has masked facies and we want to get their opinion on possible Parkinson's.  Patient agreeable to referral today and this was placed.

## 2019-05-06 NOTE — Assessment & Plan Note (Signed)
Lab Results  Component Value Date   TSH 2.29 10/08/2017  Controlled previously but not recently updated-need to get TSH next labs in office-or if if we have a chance to order labs again through home health

## 2019-05-06 NOTE — Progress Notes (Signed)
Phone [phone] Virtual visit via Video note   Subjective:  Chief complaint: Chief Complaint  Patient presents with  . Follow-up  . virtual hospital f/u   This visit type was conducted due to national recommendations for restrictions regarding the COVID-19 Pandemic (e.g. social distancing).  This format is felt to be most appropriate for this patient at this time balancing risks to patient and risks to population by having him in for in person visit.  No physical exam was performed (except for noted visual exam or audio findings with Telehealth visits).    Our team/I connected with Zachary Decker at  9:20 AM EST by a video enabled telemedicine application (doxy.me or caregility through epic) and verified that I am speaking with the correct person using two identifiers.  Location patient: [facility] Location provider: [facility], office Persons participating in the virtual visit:  patient  Our team/I discussed the limitations of evaluation and management by telemedicine and the availability of in person appointments. In light of current covid-19 pandemic, patient also understands that we are trying to protect them by minimizing in office contact if at all possible.  The patient expressed consent for telemedicine visit and agreed to proceed. Patient understands insurance will be billed.   Zachary Decker is a 85 y.o. year old very pleasant male patient who presents for transitional care management and hospital follow up for abscess from cellulitis and GI bleed. Patient was hospitalized from 04/20/2019 to 04/30/2019. A TCM phone call was completed on 05/02/2019. Medical complexity high  85 year old male hospitalized for MSSA bacteremia related to right lower extremity cellulitis and also treated for upper GI bleed from duodenal ulcer.  Patient was noted to have melena and bloody stools with acute blood loss anemia and hemoglobin down to 7.1 at the lowest.  Aspirin and Lovenox were discontinued.   He was initially started on IV Protonix and later transitioned to high-dose PPI orally twice daily.  EGD on 04/28/2019 showed duodenal ulcer with adherent clot.  Patient also started on iron supplement while hospitalized.  Due to constipation issues on an outpatient basis we started him on MiraLAX which was helpful. -Despite CAD history aspirin was not restarted on discharge and we will not restart today without GI input -Outpatient GI follow-up planned on December 8 with Dr. Tarri Glenn  Patient with right lower extremity cellulitis leading to sepsis and MSSA bacteremia.  1 out of 2 blood cultures positive for MSSA patient initially on ceftriaxone IV later transitioned to Ancef IV.  He was treated with 2 weeks of IV Ancef and completed these yesterday.  Echocardiogram without vegetations and EF was 45 to 50% which is up from his baseline years ago related to ischemic cardiomyopathy.  Patient with significant weakness and was advised skilled nursing facility but patient preferred to bring him home.  He is doing PT and OT at home.  Patient with baseline CKD stage III and had acute kidney injury elated to sepsis-improved by discharge and we are trying to get outpatient BMP records  With gait and balance issues they recommended outpatient neurology follow-up particularly with symmetric dilation of the lateral ventricles on head CT with concern for NPH.  No memory issues reported whatsoever  Hypothyroidism-maintained on levothyroxine-TSH was not checked while hospitalized  Hyperlipidemia-maintained on simvastatin while hospitalized.  Lipid panel was not obtained  For hypertension and cardiomyopathy-lisinopril was held due to low blood pressures before discharge.  We discharged carvedilol after discharge and blood pressure is starting to improve.  Patient suffering from insomnia-try to trazodone half tablet last night was able to sleep 3 hours and then took another half tablet when he woke up but unable to  fall back to sleep.  This can be anxiety provoking for him and he may try his diazepam if family is very close  Family is concerned about bedsores since discharge.  Of note I personally reviewed head CT 1.  No bony abnormalities.  No obvious stroke or mass.  Patient does have dilation of bilateral lateral ventricles but appears similar to prior head CTs.  Some generalized cerebral volume loss.  Some mild chronic small vessel ischemic changes throughout cerebral white matter also noted.  Of note [facility] and [facility] on Wednesday-we have yet to receive records   See problem oriented charting as well ROS-generalized weakness, no fever or chills.  No worsening redness on lower extremities.  No chest pain reported  Past Medical History-  Patient Active Problem List   Diagnosis Date Noted  . Duodenal ulcer     Priority: High  . MSSA bacteremia 04/25/2019    Priority: High  . Cellulitis of right leg 04/20/2019    Priority: High  . Balance disorder 04/13/2018    Priority: High  . CAD w/ hx MI s/p CABG 09/07/2012    Priority: High  . Cardiomyopathy, ischemic 03/22/2010    Priority: High  . Low back pain 04/19/2015    Priority: Medium  . CKD (chronic kidney disease), stage III 04/20/2014    Priority: Medium  . Anxiety state 04/17/2014    Priority: Medium  . HYPERCHOLESTEROLEMIA 10/31/2008    Priority: Medium  . BPH (benign prostatic hyperplasia) 09/28/2007    Priority: Medium  . Hypothyroidism 03/26/2007    Priority: Medium  . Essential hypertension 03/22/2007    Priority: Medium  . Senile purpura ([place]) 04/13/2018    Priority: Low  . ANEMIA, VITAMIN B12 DEFICIENCY 04/22/2010    Priority: Low  . Actinic keratosis 06/25/2009    Priority: Low  . BASAL CELL CARCINOMA, NOSE 02/05/2009    Priority: Low  . Localized osteoarthrosis, lower leg 12/06/2008    Priority: Low  . GERD 03/22/2007    Priority: Low  .  Dehydration 04/20/2019  . Generalized weakness 04/20/2019  . Cellulitis of left foot   . Cellulitis of left leg 10/07/2018  . Insomnia 04/13/2018    Medications- reviewed and updated  A medical reconciliation was performed comparing current medicines to hospital discharge medications. Current Outpatient Medications  Medication Sig Dispense Refill  . Acetaminophen (TYLENOL EXTRA STRENGTH PO) Take 1-2 tablets by mouth daily as needed (for back pain).    . B Complex-C-Folic Acid (FOLBEE PLUS) TABS TAKE 1 TABLET BY MOUTH EVERY DAY 90 tablet 1  . diclofenac sodium (VOLTAREN) 1 % GEL APPLY 2 GRAMS TO AFFECTED AREA 4 TIMES A DAY (Patient taking differently: Apply 2 g topically 4 (four) times daily. ) 100 g 11  . ferrous sulfate 325 (65 FE) MG EC tablet Take 1 tablet (325 mg total) by mouth 2 (two) times daily. 60 tablet 0  . levothyroxine (SYNTHROID) 50 MCG tablet TAKE 1 TABLET BY MOUTH DAILY BEFORE BREAKFAST 90 tablet 1  . Multiple Vitamins-Minerals (CENTRUM SILVER 50+MEN) TABS Take 1 tablet by mouth daily.    . nitroGLYCERIN (NITROSTAT) 0.4 MG SL tablet Place 1 tablet (0.4 mg total) under the tongue every 5 (five) minutes as needed for chest pain. [place]  tablet 1  . pantoprazole (PROTONIX) 40 MG tablet Take 1 tablet (40 mg total) by mouth 2 (two) times daily. 60 tablet 1  . simvastatin (ZOCOR) 20 MG tablet TAKE 1 TABLET BY MOUTH EVERYDAY AT BEDTIME (Patient taking differently: Take 20 mg by mouth at bedtime. ) 90 tablet 3  . tamsulosin (FLOMAX) 0.4 MG CAPS capsule TAKE 1 CAPSULE BY MOUTH EVERY DAY 90 capsule 3  . traZODone (DESYREL) 50 MG tablet TAKE 0.5-1 TABLETS (25-50 MG TOTAL) BY MOUTH AT BEDTIME AS NEEDED FOR SLEEP. 90 tablet 1  . vitamin B-12 (CYANOCOBALAMIN) 500 MCG tablet Take 500 mcg by mouth daily.    . famotidine (PEPCID) 20 MG tablet Take 20 mg by mouth as needed for heartburn or indigestion.      No current facility-administered medications for this visit.    Objective  Objective:   BP (!) 108/57   Pulse 68  Gen: NAD, resting comfortably in chair, head tends to tilt forward. Not a lot of expression in patients face Normal respiratory rate, non labored breathing    Assessment and Plan:   Hospital follow-up for cellulitis leading to bacteremia and severe GI bleed due to duodenal ulcer  Cellulitis of right leg Resolved per patient.  He completed 2 weeks of IV antibiotics with Ancef.  Unfortunately cellulitis appears to have led to MSSA bacteremia and this was also covered by Ancef.  Patient has PICC line in place with plan to pull it later today by nursing who is coming by the house  MSSA bacteremia Patient with MSSA on 1 of 2 blood cultures initially drawn.  Has completed 2-week course of Ancef with plans to remove PICC line likely today-I signed order for home health yesterday.  Repeat blood cultures on 04/22/2019 were negative thankfully.  Patient had echocardiogram which did not show any vegetations.  Plan was for infectious disease follow-up per hospitalist note outpatient but this has not been scheduled-family would prefer not to follow-up unless absolutely necessary-I will message infectious disease.  Duodenal ulcer Duodenal ulcer led to significant GI bleed.  EGD 04/28/2019.  hemoglobin is low at 7.1.  Received 3 units of packed red blood cells during hospitalization.  He is on high-dose PPI twice daily.  Patient has close GI follow-up with Dr. Tarri Glenn on December 8.  We are trying to obtain a CBC, CMP-apparently blood was drawn this week but we have yet to receive it  Cardiomyopathy, ischemic EF up to 45 to 50% during hospitalization.  Unfortunately patient's blood pressure is not tolerating lisinopril or carvedilol-we discontinued carvedilol post discharge and blood pressure is doing better.  I have asked patient's family to let me know if blood pressure gets over 140/90 and likely restart carvedilol first and lisinopril later particularly if creatinine looks good  when we receive labs.  Balance disorder Hospitalist recommended outpatient neurology follow-up for the balance issues as well as CT scan of the head and question of NPH/normal pressure hydrocephalus.  Patient also potentially has masked facies and we want to get their opinion on possible Parkinson's.  Patient agreeable to referral today and this was placed.  CAD w/ hx MI s/p CABG Patient is chest pain-free.  Unfortunately we are having to hold aspirin at present and will defer to GI at their follow-up visit.  He is maintained on statin.  Continue to monitor  CKD (chronic kidney disease), stage III Short-term worsening of renal function while hospitalized.  We are trying to get records of what sounds like  potential updated BMP outpatient.  We will trend.  Patient remain off blood pressure medicine for now as hypotension could worsen renal function.  Essential hypertension Patient's blood pressure running low despite not being on carvedilol or lisinopril at the moment.  Will remain off unless blood pressure greater than 140/90  HYPERCHOLESTEROLEMIA Maintained on statin-continue simvastatin.  Had joint aches on atorvastatin and Crestor was expensive in the past.  Patient overdue for lipid panel and will obtain next time Patient in office-not pressing given COVID-19 Lab Results  Component Value Date   CHOL 114 10/08/2017   HDL 43.40 10/08/2017   LDLCALC 60 10/08/2017   LDLDIRECT 60.0 04/08/2016   TRIG 54.0 10/08/2017   CHOLHDL 3 10/08/2017     Hypothyroidism Lab Results  Component Value Date   TSH 2.29 10/08/2017  Controlled previously but not recently updated-need to get TSH next labs in office-or if if we have a chance to order labs again through home health   Generalized weakness Patient with severe weakness after hospitalization and skilled nursing facility was suggested.  He is trying to work with PT and OT at home-family would much rather keep him at home if at all possible  BPH  (benign prostatic hyperplasia) Patient with history of urinary retention so we need to maintain Flomax  Insomnia Insomnia is a significant issue.  They ask about using something stronger than trazodone-I am concerned given his fall risk to try anything else.  Apparently he has some old diazepam and expected to be used this to have family very close and available to help.  Sounds like patient may have some redness on his buttocks with slight skin breakthrough-sounds like stage II ulcer-recommended Tegaderm type dressing and can have home health provide wound care if needed -Recommended frequent turns as well  Of note apparently [facility] and [facility] on Wednesday-we have yet to receive records-I set a reminder to myself to check in 5 days if not yet received  Recommended follow up: Sent to staff "We did not schedule follow-up but I think 1 month visit would be reasonable or sooner if needed" Future Appointments  Date Time Provider [place]  05/31/2019  1:50 PM Thornton Park, MD [facility]   Lab/Order associations:   ICD-10-CM   1. Balance disorder  R26.89 Ambulatory referral to Neurology  2. Cellulitis of right leg  L03.115   3. MSSA bacteremia  R78.81    B95.61   4. Duodenal ulcer  K26.9   5. Cardiomyopathy, ischemic  I25.5   6. Coronary artery disease involving native coronary artery of native heart without angina pectoris  I25.10   7. Stage 3 chronic kidney disease, unspecified whether stage 3a or 3b CKD  N18.30   8. Essential hypertension  I10   9. HYPERCHOLESTEROLEMIA  E78.00   10. Hypothyroidism due to acquired atrophy of thyroid  E03.4   11. Generalized weakness  R53.1   12. Benign prostatic hyperplasia with urinary retention  N40.1    R33.8   13. Insomnia, unspecified type  G47.00    Meds ordered this encounter  Medications  . pantoprazole (PROTONIX) 40 MG tablet    Sig: Take 1 tablet (40 mg total)  by mouth 2 (two) times daily.    Dispense:  60 tablet    Refill:  1   Return precautions advised.  Garret Reddish, MD

## 2019-05-06 NOTE — Assessment & Plan Note (Signed)
Insomnia is a significant issue.  They ask about using something stronger than trazodone-I am concerned given his fall risk to try anything else.  Apparently he has some old diazepam and expected to be used this to have family very close and available to help.

## 2019-05-06 NOTE — Assessment & Plan Note (Signed)
Maintained on statin-continue simvastatin.  Had joint aches on atorvastatin and Crestor was expensive in the past.  Patient overdue for lipid panel and will obtain next time Patient in office-not pressing given COVID-19 Lab Results  Component Value Date   CHOL 114 10/08/2017   HDL 43.40 10/08/2017   LDLCALC 60 10/08/2017   LDLDIRECT 60.0 04/08/2016   TRIG 54.0 10/08/2017   CHOLHDL 3 10/08/2017

## 2019-05-06 NOTE — Assessment & Plan Note (Signed)
Duodenal ulcer led to significant GI bleed.  EGD 04/28/2019.  hemoglobin is low at 7.1.  Received 3 units of packed red blood cells during hospitalization.  He is on high-dose PPI twice daily.  Patient has close GI follow-up with Dr. Tarri Glenn on December 8.  We are trying to obtain a CBC, CMP-apparently blood was drawn this week but we have yet to receive it

## 2019-05-06 NOTE — Assessment & Plan Note (Signed)
Patient with severe weakness after hospitalization and skilled nursing facility was suggested.  He is trying to work with PT and OT at home-family would much rather keep him at home if at all possible

## 2019-05-06 NOTE — Assessment & Plan Note (Signed)
Patient's blood pressure running low despite not being on carvedilol or lisinopril at the moment.  Will remain off unless blood pressure greater than 140/90

## 2019-05-06 NOTE — Assessment & Plan Note (Signed)
Resolved per patient.  He completed 2 weeks of IV antibiotics with Ancef.  Unfortunately cellulitis appears to have led to MSSA bacteremia and this was also covered by Ancef.  Patient has PICC line in place with plan to pull it later today by nursing who is coming by the house

## 2019-05-06 NOTE — Assessment & Plan Note (Signed)
Patient with history of urinary retention so we need to maintain Flomax

## 2019-05-06 NOTE — Patient Instructions (Addendum)
Health Maintenance Due  Topic Date Due  . TETANUS/TDAP  06/24/2015

## 2019-05-06 NOTE — Assessment & Plan Note (Signed)
Short-term worsening of renal function while hospitalized.  We are trying to get records of what sounds like potential updated BMP outpatient.  We will trend.  Patient remain off blood pressure medicine for now as hypotension could worsen renal function.

## 2019-05-06 NOTE — Assessment & Plan Note (Signed)
Patient is chest pain-free.  Unfortunately we are having to hold aspirin at present and will defer to GI at their follow-up visit.  He is maintained on statin.  Continue to monitor

## 2019-05-06 NOTE — Assessment & Plan Note (Signed)
EF up to 45 to 50% during hospitalization.  Unfortunately patient's blood pressure is not tolerating lisinopril or carvedilol-we discontinued carvedilol post discharge and blood pressure is doing better.  I have asked patient's family to let me know if blood pressure gets over 140/90 and likely restart carvedilol first and lisinopril later particularly if creatinine looks good when we receive labs.

## 2019-05-09 NOTE — Telephone Encounter (Signed)
Copied from [place] [id number]. Topic: General - Other >> May 09, 2019 11:14 AM Yvette Rack wrote: Reason for CRM: [name] with [facility] called to see if Dr. Yong Channel wanted to order another round of labs due to pt white blood cell count being so high. Cb# [phone]

## 2019-05-09 NOTE — Telephone Encounter (Signed)
I believe you completed an adequate course of antibiotics-I do not recommend further antibiotics at this time.  I will also CC Dr. Baxter Flattery on this to see if she wants outpatient ID follow up and to follow up with patient in clinic about this concern.   I am more concerned about how low hemoglobin remains at 7.0. Team can you help set up another repeat CBC with differential under anemia unspecified sometime this week through home health? I am ccing Dr. Tarri Glenn as Juluis Rainier as sees her outpatient on December 8th. I have not had many people transfused many patients outpatient but would be interested in her opinion on this at this level for him

## 2019-05-09 NOTE — Telephone Encounter (Signed)
Zachary Decker states pt last WBC was 12.9 and his picc line was removed and the family wants to know if you want to continue oral antibiotics since no longer getting it through the pic line.

## 2019-05-10 ENCOUNTER — Encounter: Payer: Self-pay | Admitting: Neurology

## 2019-05-10 ENCOUNTER — Encounter: Payer: Self-pay | Admitting: Family Medicine

## 2019-05-10 DIAGNOSIS — M6281 Muscle weakness (generalized): Secondary | ICD-10-CM | POA: Diagnosis not present

## 2019-05-10 DIAGNOSIS — Z452 Encounter for adjustment and management of vascular access device: Secondary | ICD-10-CM

## 2019-05-10 DIAGNOSIS — E039 Hypothyroidism, unspecified: Secondary | ICD-10-CM

## 2019-05-10 DIAGNOSIS — I2510 Atherosclerotic heart disease of native coronary artery without angina pectoris: Secondary | ICD-10-CM

## 2019-05-10 DIAGNOSIS — I252 Old myocardial infarction: Secondary | ICD-10-CM

## 2019-05-10 DIAGNOSIS — E785 Hyperlipidemia, unspecified: Secondary | ICD-10-CM

## 2019-05-10 DIAGNOSIS — N183 Chronic kidney disease, stage 3 unspecified: Secondary | ICD-10-CM

## 2019-05-10 DIAGNOSIS — G729 Myopathy, unspecified: Secondary | ICD-10-CM

## 2019-05-10 DIAGNOSIS — Z951 Presence of aortocoronary bypass graft: Secondary | ICD-10-CM

## 2019-05-10 DIAGNOSIS — L03115 Cellulitis of right lower limb: Secondary | ICD-10-CM | POA: Diagnosis not present

## 2019-05-10 NOTE — Telephone Encounter (Signed)
Noted. Thanks.

## 2019-05-10 NOTE — Telephone Encounter (Signed)
[place] please see if we have an earlier appointment with me or Wallace Going to see Lezlie Lye.  Thank you.

## 2019-05-10 NOTE — Telephone Encounter (Signed)
Alnita Aybar RN spoke with patient's wife about an earlier appointment. Mrs. Chugh stated she preferred to keep the appointment which was scheduled on 12/8. She stated they were not available during the only opening in [facility] schedule this week. No other availability with [facility] or [facility].

## 2019-05-16 ENCOUNTER — Telehealth: Payer: Self-pay | Admitting: Family Medicine

## 2019-05-16 NOTE — Telephone Encounter (Signed)
Ok to give order? 

## 2019-05-16 NOTE — Telephone Encounter (Signed)
See note

## 2019-05-16 NOTE — Telephone Encounter (Signed)
Yes thanks, also ordered BMP under elevated BUN

## 2019-05-16 NOTE — Telephone Encounter (Signed)
Brittney, RN with [facility], requesting order to check patients CBC with diff.

## 2019-05-17 NOTE — Telephone Encounter (Signed)
Called and lm on [place] with [facility] for labs.

## 2019-05-23 ENCOUNTER — Other Ambulatory Visit: Payer: Self-pay | Admitting: Family Medicine

## 2019-05-23 LAB — BASIC METABOLIC PANEL
BUN: 15 (ref 4–21)
CO2: 28 — AB (ref 13–22)
Chloride: 104 (ref 99–108)
Creatinine: 1 (ref 0.6–1.3)
Glucose: 77
Potassium: 4.4 (ref 3.4–5.3)
Sodium: 141 (ref 137–147)

## 2019-05-23 LAB — CBC AND DIFFERENTIAL
HCT: 30 — AB (ref 41–53)
Hemoglobin: 9.1 — AB (ref 13.5–17.5)
Neutrophils Absolute: 56
Platelets: 311 (ref 150–399)
WBC: 7.2

## 2019-05-23 LAB — CBC: RBC: 2.84 — AB (ref 3.87–5.11)

## 2019-05-23 LAB — COMPREHENSIVE METABOLIC PANEL: Calcium: 8.3 — AB (ref 8.7–10.7)

## 2019-05-27 ENCOUNTER — Encounter: Payer: Self-pay | Admitting: Family Medicine

## 2019-05-31 ENCOUNTER — Ambulatory Visit: Payer: Medicare Other | Admitting: Gastroenterology

## 2019-06-01 ENCOUNTER — Encounter: Payer: Self-pay | Admitting: Gastroenterology

## 2019-06-01 ENCOUNTER — Ambulatory Visit (INDEPENDENT_AMBULATORY_CARE_PROVIDER_SITE_OTHER): Payer: Medicare Other | Admitting: Gastroenterology

## 2019-06-01 VITALS — BP 124/70 | HR 67 | Temp 97.1°F | Ht 74.0 in | Wt 185.0 lb

## 2019-06-01 DIAGNOSIS — D500 Iron deficiency anemia secondary to blood loss (chronic): Secondary | ICD-10-CM | POA: Diagnosis not present

## 2019-06-01 DIAGNOSIS — K264 Chronic or unspecified duodenal ulcer with hemorrhage: Secondary | ICD-10-CM | POA: Diagnosis not present

## 2019-06-01 NOTE — Patient Instructions (Signed)
If you are age 83 or older, your body mass index should be between 23-30. Your Body mass index is 23.75 kg/m. If this is out of the aforementioned range listed, please consider follow up with your Primary Care Provider.  If you are age 42 or younger, your body mass index should be between 19-25. Your Body mass index is 23.75 kg/m. If this is out of the aformentioned range listed, please consider follow up with your Primary Care Provider.

## 2019-06-01 NOTE — Progress Notes (Signed)
Referring Provider: Aline August, MD Primary Care Physician:  [name], MD  Chief complaint:  Hospital follow-up   IMPRESSION:  GI bleed due to 12 mm cratered duodenal bulb ulcer GI blood loss anemia Wheelchair-bound Brother with colon cancer in his 41s  Etiology of duodenal ulcer remains unclear.  Follow-up EGD had been recommended 8 to 10 weeks following his hospitalization to document healing.  Thankfully his hemoglobin has improved with medical therapy and iron supplementation.  Given the recent Covid surge and his clinical stability, I recommend deferring endoscopic evaluation until Mr. Kobashigawa can more safely have an outpatient procedure performed at the hospital.  Could proceed more urgently with any recurrent symptoms.  Given his advanced age and comorbidities would only proceed with colonoscopy if there is progressive anemia with evidence for ongoing GI blood loss despite resolution of the duodenal ulcer.  PLAN: Protonix 40 mg BID EGD at the hospital after Covid surge has improved Follow-up in the office in 6 months, or earlier as needed  Please see the "Patient Instructions" section for addition details about the plan.  HPI: Zachary Decker is a 85 y.o. male who was seen in hospital follow-up.  He was hospitalized 04/20/2019 through 04/30/2019 for right lower extremity cellulitis.  His hospitalization was complicated by symptomatic GI blood loss anemia related to a duodenal ulcer bleed occurring while on Lovenox. Daughter accompanies him to this appointment. She has been staying with him since his hospitalization.  Interval history is obtained through the patient, his daughter, and review of his electronic health record.  He has a history of hypertension, coronary artery disease status post CABG, peripheral vascular disease hypothyroidism, hyperlipidemia, stage III chronic kidney disease, symmetric dilatation of the lateral ventricles, gait imbalance, and recent lower  extremity cellulitis.  Boese has been doing well.  He has been taking Protonix 40 mg twice daily and iron supplements. Dr. Yong Channel told her that his hemoglobin has improved from 7 to 9.1.  Daughter notes dark stools on iron.  Come more formed since he was hospitalized.  No melena. GI ROS is negative today.   Hemoglobin on admission was 7.3. The most recent records in epic from 04/30/2019 show a hemoglobin of 7.0, MCV 101, RDW 15.3, platelets 235.  Mr. Saxon denies the use of any NSAIDs.  Endoscopic history: Flexible sigmoidoscopy 03/25/1990: Diverticulosis EGD 04/28/2019: Normal esophagus normal stomach, 12 mm cratered duodenal bulb ulcer  Gastric biopsies negative for H pylori or intestinal metaplasia.   His brother had colon cancer in his 42s.   Past Medical History:  Diagnosis Date  . Acute myocardial infarction of inferoposterior wall, subsequent episode of care ([place])   . CAD (coronary artery disease)   . Encounter for long-term (current) use of other medications   . Family history of malignant neoplasm of gastrointestinal tract   . GERD (gastroesophageal reflux disease)   . Hypercholesterolemia   . Hypertension   . Hypertrophy of prostate with urinary obstruction and other lower urinary tract symptoms (LUTS)   . Personal history of thrombophlebitis   . PVD (peripheral vascular disease) ([place])   . Respiratory failure ([place])   . Unspecified adverse effect of unspecified drug, medicinal and biological substance   . Unspecified hypothyroidism     Past Surgical History:  Procedure Laterality Date  . BIOPSY  04/28/2019   Procedure: BIOPSY;  Surgeon: Thornton Park, MD;  Location: [facility] ENDOSCOPY;  Service: Gastroenterology;;  . CHOLECYSTECTOMY  02/26/2012   Procedure: LAPAROSCOPIC CHOLECYSTECTOMY WITH INTRAOPERATIVE CHOLANGIOGRAM;  Surgeon: Madilyn Hook, DO;  Location: [facility];  Service: General;  Laterality: N/A;  . CORONARY ARTERY BYPASS GRAFT     x 5 on August 29, 2008  .  ESOPHAGOGASTRODUODENOSCOPY (EGD) WITH PROPOFOL N/A 04/28/2019   Procedure: ESOPHAGOGASTRODUODENOSCOPY (EGD) WITH PROPOFOL;  Surgeon: Thornton Park, MD;  Location: [facility];  Service: Gastroenterology;  Laterality: N/A;  . KNEE SURGERY    . tachecostomy placement     after heart attack  . TONSILLECTOMY      Current Outpatient Medications  Medication Sig Dispense Refill  . Acetaminophen (TYLENOL EXTRA STRENGTH PO) Take 1-2 tablets by mouth daily as needed (for back pain).    . B Complex-C-Folic Acid (FOLBEE PLUS) TABS TAKE 1 TABLET BY MOUTH EVERY DAY 90 tablet 1  . diclofenac sodium (VOLTAREN) 1 % GEL APPLY 2 GRAMS TO AFFECTED AREA 4 TIMES A DAY (Patient taking differently: Apply 2 g topically 4 (four) times daily. ) 100 g 11  . famotidine (PEPCID) 20 MG tablet Take 20 mg by mouth as needed for heartburn or indigestion.     . ferrous sulfate 325 (65 FE) MG EC tablet TAKE 1 TABLET BY MOUTH TWICE A DAY 60 tablet 0  . levothyroxine (SYNTHROID) 50 MCG tablet TAKE 1 TABLET BY MOUTH DAILY BEFORE BREAKFAST 90 tablet 1  . Multiple Vitamins-Minerals (CENTRUM SILVER 50+MEN) TABS Take 1 tablet by mouth daily.    . nitroGLYCERIN (NITROSTAT) 0.4 MG SL tablet Place 1 tablet (0.4 mg total) under the tongue every 5 (five) minutes as needed for chest pain. 20 tablet 1  . pantoprazole (PROTONIX) 40 MG tablet Take 1 tablet (40 mg total) by mouth 2 (two) times daily. 60 tablet 1  . simvastatin (ZOCOR) 20 MG tablet TAKE 1 TABLET BY MOUTH EVERYDAY AT BEDTIME (Patient taking differently: Take 20 mg by mouth at bedtime. ) 90 tablet 3  . tamsulosin (FLOMAX) 0.4 MG CAPS capsule TAKE 1 CAPSULE BY MOUTH EVERY DAY 90 capsule 3  . traZODone (DESYREL) 50 MG tablet TAKE 0.5-1 TABLETS (25-50 MG TOTAL) BY MOUTH AT BEDTIME AS NEEDED FOR SLEEP. 90 tablet 1  . vitamin B-12 (CYANOCOBALAMIN) 500 MCG tablet Take 500 mcg by mouth daily.     No current facility-administered medications for this visit.     Allergies as of  06/01/2019 - Review Complete 06/01/2019  Allergen Reaction Noted  . Losartan potassium Other (See Comments) 03/22/2007  . Naproxen Nausea And Vomiting 10/31/2008  . Penicillins Rash 03/22/2007    Family History  Problem Relation Age of Onset  . Heart disease Mother   . Tuberculosis Father     Social History   Socioeconomic History  . Marital status: Married    Spouse name: Not on file  . Number of children: Not on file  . Years of education: Not on file  . Highest education level: Master's degree (e.g., MA, MS, MEng, MEd, MSW, MBA)  Occupational History    Comment: Retired Careers adviser   Social Needs  . Financial resource strain: Not on file  . Food insecurity    Worry: Not on file    Inability: Not on file  . Transportation needs    Medical: Not on file    Non-medical: Not on file  Tobacco Use  . Smoking status: Never Smoker  . Smokeless tobacco: Never Used  Substance and Sexual Activity  . Alcohol use: No  . Drug use: No  . Sexual activity: Not on file  Lifestyle  . Physical activity  Days per week: Not on file    Minutes per session: Not on file  . Stress: Not on file  Relationships  . Social Herbalist on phone: Not on file    Gets together: Not on file    Attends religious service: Not on file    Active member of club or organization: Not on file    Attends meetings of clubs or organizations: Not on file    Relationship status: Not on file  . Intimate partner violence    Fear of current or ex partner: Not on file    Emotionally abused: Not on file    Physically abused: Not on file    Forced sexual activity: Not on file  Other Topics Concern  . Not on file  Social History Narrative   Married 1958. 2 kids (boy, girl). 2 grandkids (boy and girl).       Retired from [employer] ([employer] in [place])      [place]: former Air cabin crew, tv, reading    Review of Systems: 12 system ROS is negative except as noted above.   Physical Exam:  General:   Alert,  well-nourished, pleasant and cooperative in NAD. Examined in a wheelchair.  Head:  Normocephalic and atraumatic. Eyes:  Sclera clear, no icterus.   Conjunctiva pink. Ears:  Normal auditory acuity. Nose:  No deformity, discharge,  or lesions. Mouth:  No deformity or lesions.   Neck:  Supple; no masses or thyromegaly. Lungs:  Clear throughout to auscultation.   No wheezes. Heart:  Regular rate and rhythm; no murmurs. Abdomen:  Soft,nontender, nondistended, normal bowel sounds, no rebound or guarding. No hepatosplenomegaly.   Rectal:  Deferred  Msk:  Symmetrical. No boney deformities LAD: No inguinal or umbilical LAD Extremities:  No clubbing or edema. Neurologic:  Alert and  oriented x4;  grossly nonfocal Skin:  Intact without significant lesions or rashes. Psych:  Alert and cooperative. Normal mood and affect.   Raychel Dowler L. Tarri Glenn, MD, MPH 06/01/2019, 3:26 PM

## 2019-06-03 NOTE — Progress Notes (Signed)
Zachary Decker was seen today in the movement disorders clinic for neurologic consultation at the request of Zachary Olp, MD.  This patient is accompanied in the office by his daughter who supplements the history. The consultation is for the evaluation of abnormal brain scan and possible parkinsonism.  Patient is an 85 year old male with a complex medical history.  Numerous records have been reviewed prior to today's visit.  Patient has a history of hypothyroidism, left leg cellulitis who was recently in the hospital in November for GI bleed and sepsis.  While in the hospital, it was noted that he had generalized weakness and gait instability and it was suspected by the hospitalist that the patient may have a movement disorder at baseline, so it was suggested that he be referred here for evaluation.  In addition, the patient had a CT of the brain while in the hospital and the radiologist noted that there was some ventriculomegaly that they felt was somewhat out of proportion to atrophy.  However, it was stable when compared to prior scans from April, 2020.     Specific Symptoms:  Tremor: Yes.  ,  hands x 6-8 months Family hx of similar:  No. Voice: weaker until 2010 - prolonged intubation and subsequent trach for CABG and raspy ever since Sleep: sleeps well x last month (was bad)  Vivid Dreams:  No.  Acting out dreams:  No. Wet Pillows: Yes.   Postural symptoms:  Yes.  , uses transport chair at home but freezes in doorways if tries to walk with the walker.  Feels like being thrust foreward with ambulation x 6-8 months.  Trouble with transfers.  Pre-covid he was able to drive and went to gym but got stuck in the home and increased troubles since then.  Back pain also increased since then.  Has PT in the home with [facility]  Falls?  No. Bradykinesia symptoms: shuffling gait, slow movements and difficulty getting out of a chair Loss of smell:  Yes.   Loss of taste:  No. Urinary  Incontinence:  No. Difficulty Swallowing:  No. Handwriting, micrographia: Yes.   Trouble with ADL's:  Yes.  , wife assists with dressing x 2-3 months  Trouble buttoning clothing: Yes.   Depression:  No. Memory changes:  No. Hallucinations:  No.  visual distortions: No. N/V:  No. Lightheaded:  No.  Syncope: No. Diplopia:  No. Dyskinesia:  No. Prior exposure to reglan/antipsychotics: No.   ALLERGIES:   Allergies  Allergen Reactions  . Losartan Potassium Other (See Comments)    Not known  . Naproxen Nausea And Vomiting  . Penicillins Rash    Did it involve swelling of the face/tongue/throat, SOB, or low BP? No Did it involve sudden or severe rash/hives, skin peeling, or any reaction on the inside of your mouth or nose? Yes Did you need to seek medical attention at a hospital or doctor's office? Unknown When did it last happen? If all above answers are "NO", may proceed with cephalosporin use.  TOLERATED CEFTRIAXONE 10/07/2018    CURRENT MEDICATIONS:  Current Outpatient Medications  Medication Instructions  . Acetaminophen (TYLENOL EXTRA STRENGTH PO) 1-2 tablets, Oral, Daily PRN  . B Complex-C-Folic Acid (FOLBEE PLUS) TABS TAKE 1 TABLET BY MOUTH EVERY DAY  . diclofenac sodium (VOLTAREN) 1 % GEL APPLY 2 GRAMS TO AFFECTED AREA 4 TIMES A DAY  . ferrous sulfate 325 (65 FE) MG EC tablet TAKE 1 TABLET BY MOUTH TWICE A DAY  . levothyroxine (  SYNTHROID) 50 MCG tablet TAKE 1 TABLET BY MOUTH DAILY BEFORE BREAKFAST  . nitroGLYCERIN (NITROSTAT) 0.4 mg, Sublingual, Every 5 min PRN  . pantoprazole (PROTONIX) 40 mg, Oral, 2 times daily  . simvastatin (ZOCOR) 20 MG tablet TAKE 1 TABLET BY MOUTH EVERYDAY AT BEDTIME  . tamsulosin (FLOMAX) 0.4 MG CAPS capsule TAKE 1 CAPSULE BY MOUTH EVERY DAY  . vitamin B-12 (CYANOCOBALAMIN) 500 mcg, Oral, Daily    PAST MEDICAL HISTORY:   Past Medical History:  Diagnosis Date  . Acute myocardial infarction of inferoposterior wall, subsequent episode  of care ([place])   . CAD (coronary artery disease)   . Encounter for long-term (current) use of other medications   . Family history of malignant neoplasm of gastrointestinal tract   . GERD (gastroesophageal reflux disease)   . Hypercholesterolemia   . Hypertension   . Hypertrophy of prostate with urinary obstruction and other lower urinary tract symptoms (LUTS)   . Personal history of thrombophlebitis   . PVD (peripheral vascular disease) ([place])   . Respiratory failure ([place])   . Unspecified adverse effect of unspecified drug, medicinal and biological substance   . Unspecified hypothyroidism     PAST SURGICAL HISTORY:   Past Surgical History:  Procedure Laterality Date  . BIOPSY  04/28/2019   Procedure: BIOPSY;  Surgeon: Zachary Park, MD;  Location: [facility];  Service: Gastroenterology;;  . CATARACT EXTRACTION, BILATERAL    . CHOLECYSTECTOMY  02/26/2012   Procedure: LAPAROSCOPIC CHOLECYSTECTOMY WITH INTRAOPERATIVE CHOLANGIOGRAM;  Surgeon: Zachary Hook, DO;  Location: [facility];  Service: General;  Laterality: N/A;  . CORONARY ARTERY BYPASS GRAFT     x 5 on August 29, 2008  . ESOPHAGOGASTRODUODENOSCOPY (EGD) WITH PROPOFOL N/A 04/28/2019   Procedure: ESOPHAGOGASTRODUODENOSCOPY (EGD) WITH PROPOFOL;  Surgeon: Zachary Park, MD;  Location: [facility];  Service: Gastroenterology;  Laterality: N/A;  . REPLACEMENT TOTAL KNEE Left   . tachecostomy placement     after heart attack  . TONSILLECTOMY      SOCIAL HISTORY:   Social History   Socioeconomic History  . Marital status: Married    Spouse name: Not on file  . Number of children: Not on file  . Years of education: Not on file  . Highest education level: Master's degree (e.g., MA, MS, MEng, MEd, MSW, MBA)  Occupational History    Comment: Retired Careers adviser   Tobacco Use  . Smoking status: Never Smoker  . Smokeless tobacco: Never Used  Substance and Sexual Activity  . Alcohol use: No  . Drug use: No  . Sexual  activity: Not on file  Other Topics Concern  . Not on file  Social History Narrative   Married 1958. 2 kids (boy, girl). 2 grandkids (boy and girl).       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   Social Determinants of Health   Financial Resource Strain:   . Difficulty of Paying Living Expenses: Not on file  Food Insecurity:   . Worried About Charity fundraiser in the Last Year: Not on file  . Ran Out of Food in the Last Year: Not on file  Transportation Needs:   . Lack of Transportation (Medical): Not on file  . Lack of Transportation (Non-Medical): Not on file  Physical Activity:   .  Days of Exercise per Week: Not on file  . Minutes of Exercise per Session: Not on file  Stress:   . Feeling of Stress : Not on file  Social Connections:   . Frequency of Communication with Friends and Family: Not on file  . Frequency of Social Gatherings with Friends and Family: Not on file  . Attends Religious Services: Not on file  . Active Member of Clubs or Organizations: Not on file  . Attends Archivist Meetings: Not on file  . Marital Status: Not on file  Intimate Partner Violence:   . Fear of Current or Ex-Partner: Not on file  . Emotionally Abused: Not on file  . Physically Abused: Not on file  . Sexually Abused: Not on file    FAMILY HISTORY:   Family Status  Relation Name Status  . Mother  Deceased at age 60  . Father  Deceased       late 25s  . MGM  Deceased  . MGF  Deceased  . PGM  Deceased  . PGF  Deceased  . Brother  Deceased  . Child 2 Alive    ROS:  Review of Systems  Constitutional: Negative.   HENT: Negative.   Eyes: Negative.   Respiratory: Negative.   Cardiovascular: Negative.   Gastrointestinal: Negative.   Genitourinary: Positive for urgency.  Musculoskeletal: Positive for back pain.  Skin: Negative.     Neurological: Positive for weakness.    PHYSICAL EXAMINATION:    VITALS:   Vitals:   06/07/19 1309  BP: (!) 163/79  Pulse: 66  SpO2: 97%  Weight: 185 lb (83.9 kg)  Height: 6\' 2"  (1.88 m)    GEN:  The patient appears stated age and is in NAD. HEENT:  Normocephalic, atraumatic.  The mucous membranes are moist. The superficial temporal arteries are without ropiness or tenderness. CV:  RRR Lungs:  CTAB Neck/HEME:  There are no carotid bruits bilaterally.  Neurological examination:  Orientation: The patient is alert and oriented x3. Fund of knowledge is appropriate.  Recent and remote memory are intact.  Attention and concentration are normal.    Able to name objects and repeat phrases. Cranial nerves: There is good facial symmetry.  There is pseudoptosis from lid lag bilaterally.  There is significant facial hypomimia.  Extraocular muscles are intact. The visual fields are full to confrontational testing. The speech is fluent and clear. Soft palate rises symmetrically and there is no tongue deviation. Hearing is intact to conversational tone. Sensation: Sensation is intact to light and pinprick throughout (facial, trunk, extremities). Vibration is intact at the bilateral big toe. There is no extinction with double simultaneous stimulation. There is no sensory dermatomal level identified. Motor: Strength is 5/5 in the bilateral upper and lower extremities.   Shoulder shrug is equal and symmetric.  There is no pronator drift. Deep tendon reflexes: Deep tendon reflexes are 2-/4 at the bilateral biceps, triceps, brachioradialis, patella and achilles. Plantar responses are downgoing bilaterally.  Movement examination: Tone: There is mod to mod severe increased tone in the bilateral upper extremities.  The tone in the lower extremities is mildly increased.  Abnormal movements: there is L>RUE rest tremor Coordination:  There is  decremation with RAM's, with any form of RAMS, including  alternating supination and pronation of the forearm, hand opening and closing, finger taps, heel taps and toe taps, left greater than right Gait and Station: The patient requires assistance out of the chair.  He is given a  walker.  He has start hesitation.  He is short stepped once he gets started.  He is not particularly stable.    Chemistry      Component Value Date/Time   NA 137 04/30/2019 0403   K 3.5 04/30/2019 0403   CL 108 04/30/2019 0403   CO2 22 04/30/2019 0403   BUN 28 (H) 04/30/2019 0403   CREATININE 1.14 04/30/2019 0403      Component Value Date/Time   CALCIUM 7.9 (L) 04/30/2019 0403   ALKPHOS 45 04/20/2019 1237   AST 27 04/20/2019 1237   ALT 20 04/20/2019 1237   BILITOT 1.0 04/20/2019 1237       ASSESSMENT/PLAN:  1.  Suspect idiopathic Parkinson's disease  -Discussed with the patient and his daughter that he likely does have idiopathic Parkinson's disease.  This is newly diagnosed at [facility] and [facility] stage III, so I suspect that he has had it much longer than stated.  -We discussed the diagnosis as well as pathophysiology of the disease.  We discussed treatment options as well as prognostic indicators.  Patient education was provided.  -We discussed that it used to be thought that levodopa would increase risk of melanoma but now it is believed that Parkinsons itself likely increases risk of melanoma. he is to get regular skin checks.  -Greater than 50% of the 60 minute visit was spent in counseling answering questions and talking about what to expect now as well as in the future.  We talked about medication options as well as potential future surgical options.  We talked about safety in the home.  -We decided to add carbidopa/levodopa 25/100.  1/2 tab tid x 1 wk, then 1/2 in am & noon & 1 at night for a week, then 1/2 in am &1 at noon &night for a week, then 1 po tid.  Risks, benefits, side effects and alternative therapies were discussed.  The opportunity to ask questions  was given and they were answered to the best of my ability.  The patient expressed understanding and willingness to follow the outlined treatment protocols.  -Patient is currently in home physical therapy.  -We discussed community resources in the area including patient support groups and community exercise programs for PD and pt education was provided to the patient.  -Discussed his abnormal brain scan.  Discussed with the patient that I was not convinced that atrophy was out of proportion to ventricular enlargement.  However, did offer the patient a lumbar puncture.  Explained to the patient, however, that this would only be temporary, and if helpful he would need to be willing to undergo a more permanent procedure, that being shunt procedure.  Ultimately, I think that this does represent idiopathic Parkinson's disease and I think that the wise thing would be to start with levodopa first and titrate that dose upward.  If that does not help, then we can relook at potential lumbar puncture in the future.  Patient and daughter agreed with that approach.  -Did discuss with the patient that most movement disorder physicians believe that the diagnosis of NPH is actually very rare and is likely much over diagnosed.  -Information given to my Parkinson social worker today.  -I will plan on seeing the patient via video visit within the next 2 to 3 months.  I will follow-up with him in person in the next 4 to 6 months.  Much greater than 50% of this visit was spent in counseling and coordinating care.  Total face to  face time:  60 min  Cc:  Zachary Olp, MD

## 2019-06-07 ENCOUNTER — Encounter: Payer: Self-pay | Admitting: Neurology

## 2019-06-07 ENCOUNTER — Ambulatory Visit: Payer: Medicare Other | Admitting: Neurology

## 2019-06-07 ENCOUNTER — Other Ambulatory Visit: Payer: Self-pay

## 2019-06-07 VITALS — BP 163/79 | HR 66 | Ht 74.0 in | Wt 185.0 lb

## 2019-06-07 DIAGNOSIS — R9402 Abnormal brain scan: Secondary | ICD-10-CM

## 2019-06-07 DIAGNOSIS — G20 Parkinson's disease: Secondary | ICD-10-CM

## 2019-06-07 MED ORDER — CARBIDOPA-LEVODOPA 25-100 MG PO TABS: 1.0000 | ORAL_TABLET | Freq: Three times a day (TID) | ORAL | 1 refills | Status: DC

## 2019-06-07 NOTE — Patient Instructions (Addendum)
Start Carbidopa Levodopa as follows:  Take 1/2 tablet three times daily, at least 30 minutes before meals (approximately 7am/11am/4pm), for one week  Then take 1/2 tablet in the morning, 1/2 tablet in the afternoon, 1 tablet in the evening, at least 30 minutes before meals, for one week  Then take 1/2 tablet in the morning, 1 tablet in the afternoon, 1 tablet in the evening, at least 30 minutes before meals, for one week  Then take 1 tablet three times daily at 7am/11am/4pm, at least 30 minutes before meals   As a reminder, carbidopa/levodopa can be taken at the same time as a carbohydrate, but we like to have you take your pill either 30 minutes before a protein source or 1 hour after as protein can interfere with carbidopa/levodopa absorption.  The physicians and staff at [facility] are committed to providing excellent care. You may receive a survey requesting feedback about your experience at our office. We strive to receive "very good" responses to the survey questions. If you feel that your experience would prevent you from giving the office a "very good " response, please contact our office to try to remedy the situation. We may be reached at [phone]. Thank you for taking the time out of your busy day to complete the survey.  

## 2019-06-18 ENCOUNTER — Encounter: Payer: Self-pay | Admitting: Family Medicine

## 2019-06-19 ENCOUNTER — Other Ambulatory Visit: Payer: Self-pay | Admitting: Family Medicine

## 2019-06-20 ENCOUNTER — Other Ambulatory Visit: Payer: Self-pay | Admitting: Family Medicine

## 2019-06-29 ENCOUNTER — Telehealth: Payer: Self-pay | Admitting: Family Medicine

## 2019-06-29 NOTE — Telephone Encounter (Signed)
Ok to give order? 

## 2019-06-29 NOTE — Telephone Encounter (Signed)
Please advise 

## 2019-06-29 NOTE — Telephone Encounter (Signed)
Physical Therapist from [facility] called stating pt needs new referral for physical therapy due to insurance renewing. Fax number is [phone]. Please advise.

## 2019-06-30 NOTE — Telephone Encounter (Signed)
Yes thanks-if patient needs another face-to-face visit for this please have patient schedule with us-I am okay with virtual.  I am not sure about timing of prior appointments if he needs this-we can also schedule him for a month after he starts visits as well I believe

## 2019-07-06 ENCOUNTER — Encounter: Payer: Self-pay | Admitting: Family Medicine

## 2019-07-06 NOTE — Telephone Encounter (Signed)
Called to give orders no answer and f/m was not clear as to who it was no message left.

## 2019-07-15 ENCOUNTER — Other Ambulatory Visit: Payer: Self-pay | Admitting: Family Medicine

## 2019-07-16 ENCOUNTER — Other Ambulatory Visit: Payer: Self-pay | Admitting: Family Medicine

## 2019-07-17 ENCOUNTER — Other Ambulatory Visit: Payer: Self-pay | Admitting: Family Medicine

## 2019-07-18 NOTE — Telephone Encounter (Signed)
Pharmacy requesting Ferrous Sulf EC 325 MG tab (90 day)   Recently refilled for 30 day. 07/16/2019  Refill okay?

## 2019-08-04 ENCOUNTER — Ambulatory Visit (INDEPENDENT_AMBULATORY_CARE_PROVIDER_SITE_OTHER): Payer: Medicare PPO | Admitting: Family Medicine

## 2019-08-04 ENCOUNTER — Encounter: Payer: Self-pay | Admitting: Family Medicine

## 2019-08-04 VITALS — Temp 97.2°F | Ht 74.0 in | Wt 185.0 lb

## 2019-08-04 DIAGNOSIS — L03116 Cellulitis of left lower limb: Secondary | ICD-10-CM

## 2019-08-04 DIAGNOSIS — R600 Localized edema: Secondary | ICD-10-CM

## 2019-08-04 DIAGNOSIS — N183 Chronic kidney disease, stage 3 unspecified: Secondary | ICD-10-CM | POA: Diagnosis not present

## 2019-08-04 MED ORDER — CEPHALEXIN 500 MG PO CAPS: 500.0000 mg | ORAL_CAPSULE | Freq: Two times a day (BID) | ORAL | 0 refills | Status: DC

## 2019-08-04 MED ORDER — FUROSEMIDE 20 MG PO TABS: ORAL_TABLET | ORAL | 0 refills | Status: DC

## 2019-08-04 NOTE — Progress Notes (Signed)
Virtual Visit via Video Note  Subjective  CC:  Chief Complaint  Patient presents with  . Skin inflammation    cellulitis flare up started over a year ago, in both legs. sleeping less. two episodes last year was hospitalized and couldn't walk   Same day acute visit; PCP not available. New pt to me. Chart reviewed.   I connected with [name] on 08/04/19 at 10:00 AM EST by a video enabled telemedicine application and verified that I am speaking with the correct person using two identifiers. Location patient: Home Location provider: [facility] [place], Office Persons participating in the virtual visit: Baron Hamper, MD Serita Sheller, [place] discussed the limitations of evaluation and management by telemedicine and the availability of in person appointments. The patient expressed understanding and agreed to proceed. HPI: Zachary Decker is a 86 y.o. male who was contacted today to address the problems listed above in the chief complaint. . 86 yo with parkinson's disease and multiple medical problems as listed below presents with help with his daughter due to changes in left>right LE skin. Had MSSA cellulitis requiring hospitalization in 10 and 04/2019 treated with IV ancef and resolved. Pt reports chronic LE edema. Daughter reports seeing increased flaking and some redness with pustules on left leg. No pain, significant redness or warmth or calf pain. He is acting well. He reports he feels well.  . Has ckd on chart with nl most recent GFR and potassium. No h/o HTN and not on ace/arb; reports had taken lasix in past for edema  Lab Results  Component Value Date   CREATININE 1.0 05/23/2019   BUN 15 05/23/2019   NA 141 05/23/2019   K 4.4 05/23/2019   CL 104 05/23/2019   CO2 28 (A) 05/23/2019    Assessment  1. Cellulitis of left leg   2. Bilateral lower extremity edema   3. Stage 3 chronic kidney disease, unspecified whether stage 3a or 3b  CKD      Plan   Cellulitis, early:  Start keflex and monitor. Hoping to avoid worsening infection. Leg and skin care discussed.  rec f/u with PCP for recheck if needed.   Edema: rec intermittent lasix to resolve, continue compression stockings and elevation. F/u with PCP if persists or requiring regular dosing of lasix. Would need repeat bmp as well. Pt's daughter understands instructions.   Monitor renal function; now improved.  I discussed the assessment and treatment plan with the patient. The patient was provided an opportunity to ask questions and all were answered. The patient agreed with the plan and demonstrated an understanding of the instructions.   The patient was advised to call back or seek an in-person evaluation if the symptoms worsen or if the condition fails to improve as anticipated. Follow up: No follow-ups on file.  Visit date not found  Meds ordered this encounter  Medications  . cephALEXin (KEFLEX) 500 MG capsule    Sig: Take 1 capsule (500 mg total) by mouth 2 (two) times daily.    Dispense:  14 capsule    Refill:  0  . furosemide (LASIX) 20 MG tablet    Sig: Take 20mg  2-3x/week as needed for leg swelling    Dispense:  20 tablet    Refill:  0      I reviewed the patients updated PMH, FH, and SocHx.    Patient Active Problem List   Diagnosis Date  Noted  . Duodenal ulcer   . MSSA bacteremia 04/25/2019  . Cellulitis of right leg 04/20/2019  . Dehydration 04/20/2019  . Generalized weakness 04/20/2019  . Cellulitis of left foot   . Cellulitis of left leg 10/07/2018  . Balance disorder 04/13/2018  . Senile purpura ([place]) 04/13/2018  . Insomnia 04/13/2018  . Low back pain 04/19/2015  . CKD (chronic kidney disease), stage III 04/20/2014  . Anxiety state 04/17/2014  . CAD w/ hx MI s/p CABG 09/07/2012  . ANEMIA, VITAMIN B12 DEFICIENCY 04/22/2010  . Cardiomyopathy, ischemic 03/22/2010  . Actinic keratosis 06/25/2009  . BASAL CELL CARCINOMA, NOSE 02/05/2009   . Localized osteoarthrosis, lower leg 12/06/2008  . HYPERCHOLESTEROLEMIA 10/31/2008  . BPH (benign prostatic hyperplasia) 09/28/2007  . Hypothyroidism 03/26/2007  . Essential hypertension 03/22/2007  . GERD 03/22/2007   Current Meds  Medication Sig  . Acetaminophen (TYLENOL EXTRA STRENGTH PO) Take 1-2 tablets by mouth daily as needed (for back pain).  . B Complex-C-Folic Acid (FOLBEE PLUS) TABS TAKE 1 TABLET BY MOUTH EVERY DAY  . carbidopa-levodopa (SINEMET) 25-100 MG tablet Take 1 tablet by mouth 3 (three) times daily.  . diclofenac sodium (VOLTAREN) 1 % GEL APPLY 2 GRAMS TO AFFECTED AREA 4 TIMES A DAY (Patient taking differently: Apply 2 g topically 4 (four) times daily. )  . ferrous sulfate 325 (65 FE) MG EC tablet TAKE 1 TABLET BY MOUTH TWICE A DAY  . levothyroxine (SYNTHROID) 50 MCG tablet TAKE 1 TABLET BY MOUTH DAILY BEFORE BREAKFAST  . nitroGLYCERIN (NITROSTAT) 0.4 MG SL tablet Place 1 tablet (0.4 mg total) under the tongue every 5 (five) minutes as needed for chest pain.  . pantoprazole (PROTONIX) 40 MG tablet TAKE 1 TABLET BY MOUTH TWICE A DAY  . simvastatin (ZOCOR) 20 MG tablet TAKE 1 TABLET BY MOUTH EVERYDAY AT BEDTIME (Patient taking differently: Take 20 mg by mouth at bedtime. )  . tamsulosin (FLOMAX) 0.4 MG CAPS capsule TAKE 1 CAPSULE BY MOUTH EVERY DAY  . vitamin B-12 (CYANOCOBALAMIN) 500 MCG tablet Take 500 mcg by mouth daily.    Allergies: Patient is allergic to losartan potassium; naproxen; and penicillins. Family History: Patient family history includes Colon cancer in his brother; Heart disease in his mother; Tuberculosis in his father. Social History:  Patient  reports that he has never smoked. He has never used smokeless tobacco. He reports that he does not drink alcohol or use drugs.  Review of Systems: Constitutional: Negative for fever malaise or anorexia Cardiovascular: negative for chest pain Respiratory: negative for SOB or persistent cough  Gastrointestinal: negative for abdominal pain  OBJECTIVE Vitals: Temp (!) 97.2 F (36.2 C) (Oral)   Ht 6\' 2"  (1.88 m)   Wt 185 lb (83.9 kg)   BMI 23.75 kg/m  General: no acute distress , A&Ox3 Masked facies Pleasant Denies pain Bilateral LE with 2+ pitting edema; left leg with flaking and multiple pustules/abrasions. No clear macular erythema. No drainage. Mild flaking on right w/o pustules  Leamon Arnt, MD

## 2019-08-11 ENCOUNTER — Other Ambulatory Visit: Payer: Self-pay | Admitting: Family Medicine

## 2019-08-16 ENCOUNTER — Ambulatory Visit (INDEPENDENT_AMBULATORY_CARE_PROVIDER_SITE_OTHER): Payer: Medicare PPO | Admitting: Family Medicine

## 2019-08-16 ENCOUNTER — Encounter: Payer: Self-pay | Admitting: Family Medicine

## 2019-08-16 VITALS — BP 126/73 | HR 54 | Ht 74.0 in

## 2019-08-16 DIAGNOSIS — G20 Parkinson's disease: Secondary | ICD-10-CM

## 2019-08-16 DIAGNOSIS — E034 Atrophy of thyroid (acquired): Secondary | ICD-10-CM | POA: Diagnosis not present

## 2019-08-16 DIAGNOSIS — L03116 Cellulitis of left lower limb: Secondary | ICD-10-CM | POA: Diagnosis not present

## 2019-08-16 DIAGNOSIS — E7800 Pure hypercholesterolemia, unspecified: Secondary | ICD-10-CM | POA: Diagnosis not present

## 2019-08-16 DIAGNOSIS — G20A1 Parkinson's disease without dyskinesia, without mention of fluctuations: Secondary | ICD-10-CM

## 2019-08-16 DIAGNOSIS — I2510 Atherosclerotic heart disease of native coronary artery without angina pectoris: Secondary | ICD-10-CM

## 2019-08-16 DIAGNOSIS — I700 Atherosclerosis of aorta: Secondary | ICD-10-CM | POA: Diagnosis not present

## 2019-08-16 DIAGNOSIS — D649 Anemia, unspecified: Secondary | ICD-10-CM | POA: Diagnosis not present

## 2019-08-16 DIAGNOSIS — D692 Other nonthrombocytopenic purpura: Secondary | ICD-10-CM

## 2019-08-16 DIAGNOSIS — I10 Essential (primary) hypertension: Secondary | ICD-10-CM

## 2019-08-16 NOTE — Assessment & Plan Note (Signed)
Continue risk factor modification with blood pressure control and statin therapy

## 2019-08-16 NOTE — Assessment & Plan Note (Signed)
Noted continued issues with easy bruising and bleeding-not on aspirin at present as needs further GI work-up but still has issues

## 2019-08-16 NOTE — Patient Instructions (Addendum)
Health Maintenance Due  Topic Date Due  . TETANUS/TDAP  06/24/2015   Depression screen [facility] 2/9 05/06/2019 11/11/2018 10/18/2018  Decreased Interest 0 0 0  Down, Depressed, Hopeless 0 0 0  PHQ - 2 Score 0 0 0  Altered sleeping 1 3 0  Tired, decreased energy 3 0 0  Change in appetite 0 0 0  Feeling bad or failure about yourself  0 0 0  Trouble concentrating 0 0 0  Moving slowly or fidgety/restless 0 0 0  Suicidal thoughts 0 0 0  PHQ-9 Score 4 3 0  Difficult doing work/chores Not difficult at all Somewhat difficult Not difficult at all

## 2019-08-16 NOTE — Assessment & Plan Note (Signed)
Blood pressure currently controlled on Lasix just as needed.  Previously has been on carvedilol and lisinopril.  May need to consider restarting with next visit with cardiology-I am hesitant to restart given fall risk and Parkinson's without cardiology firm recommendation.

## 2019-08-16 NOTE — Progress Notes (Signed)
Phone [phone] Virtual visit via Video note   Subjective:  Chief complaint: Chief Complaint  Patient presents with  . virtual  . Follow-up   This visit type was conducted due to national recommendations for restrictions regarding the COVID-19 Pandemic (e.g. social distancing).  This format is felt to be most appropriate for this patient at this time balancing risks to patient and risks to population by having him in for in person visit.  No physical exam was performed (except for noted visual exam or audio findings with Telehealth visits).    Our team/I connected with Zachary Decker at 11:20 AM EST by a video enabled telemedicine application (doxy.me or caregility through epic) and verified that I am speaking with the correct person using two identifiers.  Location patient: Home[place] Location provider: [facility], office Persons participating in the virtual visit:  patient  Our team/I discussed the limitations of evaluation and management by telemedicine and the availability of in person appointments. In light of current covid-19 pandemic, patient also understands that we are trying to protect them by minimizing in office contact if at all possible.  The patient expressed consent for telemedicine visit and agreed to proceed. Patient understands insurance will be billed.   Past Medical History-  Patient Active Problem List   Diagnosis Date Noted  . Duodenal ulcer     Priority: High  . MSSA bacteremia 04/25/2019    Priority: High  . Cellulitis of right leg 04/20/2019    Priority: High  . Parkinson's disease ([place]) 04/13/2018    Priority: High  . CAD w/ hx MI s/p CABG 09/07/2012    Priority: High  . Cardiomyopathy, ischemic 03/22/2010    Priority: High  . Low back pain 04/19/2015    Priority: Medium  . CKD (chronic kidney disease), stage III 04/20/2014    Priority: Medium  . Anxiety state 04/17/2014    Priority: Medium  . HYPERCHOLESTEROLEMIA 10/31/2008    Priority: Medium   . BPH (benign prostatic hyperplasia) 09/28/2007    Priority: Medium  . Hypothyroidism 03/26/2007    Priority: Medium  . Essential hypertension 03/22/2007    Priority: Medium  . Senile purpura ([place]) 04/13/2018    Priority: Low  . ANEMIA, VITAMIN B12 DEFICIENCY 04/22/2010    Priority: Low  . Actinic keratosis 06/25/2009    Priority: Low  . BASAL CELL CARCINOMA, NOSE 02/05/2009    Priority: Low  . Localized osteoarthrosis, lower leg 12/06/2008    Priority: Low  . GERD 03/22/2007    Priority: Low  . Atherosclerosis of aorta ([place]) 08/16/2019  . Dehydration 04/20/2019  . Generalized weakness 04/20/2019  . Cellulitis of left foot   . Cellulitis of left leg 10/07/2018  . Insomnia 04/13/2018    Medications- reviewed and updated Current Outpatient Medications  Medication Sig Dispense Refill  . Acetaminophen (TYLENOL EXTRA STRENGTH PO) Take 1-2 tablets by mouth daily as needed (for back pain).    . B Complex-C-Folic Acid (FOLBEE PLUS) TABS TAKE 1 TABLET BY MOUTH EVERY DAY 90 tablet 1  . carbidopa-levodopa (SINEMET) 25-100 MG tablet Take 1 tablet by mouth 3 (three) times daily. 270 tablet 1  . ferrous sulfate 325 (65 FE) MG EC tablet TAKE 1 TABLET BY MOUTH TWICE A DAY 180 tablet 1  . furosemide (LASIX) 20 MG tablet Take 20mg  2-3x/week as needed for leg swelling 20 tablet 0  . levothyroxine (SYNTHROID) 50 MCG tablet TAKE 1 TABLET BY MOUTH DAILY BEFORE BREAKFAST 90 tablet 1  . nitroGLYCERIN (  NITROSTAT) 0.4 MG SL tablet Place 1 tablet (0.4 mg total) under the tongue every 5 (five) minutes as needed for chest pain. 20 tablet 1  . pantoprazole (PROTONIX) 40 MG tablet TAKE 1 TABLET BY MOUTH TWICE A DAY 180 tablet 1  . simvastatin (ZOCOR) 20 MG tablet TAKE 1 TABLET BY MOUTH EVERYDAY AT BEDTIME (Patient taking differently: Take 20 mg by mouth at bedtime. ) 90 tablet 3  . tamsulosin (FLOMAX) 0.4 MG CAPS capsule TAKE 1 CAPSULE BY MOUTH EVERY DAY 90 capsule 3  . vitamin B-12 (CYANOCOBALAMIN) 500  MCG tablet Take 500 mcg by mouth daily.     No current facility-administered medications for this visit.     Objective:  BP 126/73   Pulse (!) 54   Ht 6\' 2"  (1.88 m)   SpO2 93%   BMI 23.75 kg/m  self reported vitals Gen: NAD, resting comfortably, sitting crouched over in his chair per his baseline Lungs: nonlabored, normal respiratory rate  Skin: appears dry, no obvious rash Per family has more expression in his face     Assessment and Plan   F/U on Cellulitis S: Patient with cellulitis of left lower extremity-was started on Keflex by Dr. Jonni Sanger approximately 12 days ago for 7 days.  Likely this was caught early-patient has required hospitalization in the past for cellulitis which eventually progresses to significant generalized weakness.  He was also placed on as needed Lasix up to 3 times a week-patient and family had found this helpful for reducing swelling.  He is not in his compression stockings today as he has had some weeping from the leg and also flaking of the skin that they wanted me to see.  Patient switched insurances with the new year-has not been restarted with physical [facility].  Patient and daughter state swelling/redness in the left leg has improved and essentially legs are back at baseline other than some slight weeping and flakiness.  They have been using a Dermend moisturizer (discussed could try plain Vaseline as apparently this is somewhat expensive).   Patient does have some ongoing generalized weakness but at present he does not want to do any physical therapy in the home-he is going to let me know if he changes his mind. A/P: Cellulitis appears to have resolved-we will not extend Keflex 500 mg twice daily any further.  For edema-continuing as needed Lasix is okay with me.  I would like to update his potassium and prefer they only use it if they do note swelling.  They are going to try to schedule labs as ordered  below.  #Insomnia-sleeping 11 PM to 4 AM and cannot get back to sleep after he wakes up but states he is rested when he does get up-he prefers not to add any medication to the current regimen.  #Health maintenance-daughter has some concerns about COVID-19 vaccination so far patient has not moved forward with vaccination-we discussed these concerns-daughter and patient are leaning toward waiting on the [employer] vaccination as well as some more data points before deciding.  I am happy to answer any more questions I can-we had an extended discussion already today   Parkinson's disease [facility]) New diagnosis of Parkinson's after seeing Dr. Herbert Moors has follow-up in May.  He was started on carbidopa levodopa and family has noted some improvement in his symptoms-he seems to have more expression in his face and be somewhat more mobile-he does require a walker for walking and significant support.  If  daughter is not around there primarily using a wheelchair at home.   CAD w/ hx MI s/p CABG Patient was taken off of aspirin due to prior anemia-we are going to update labs to make sure not further anemic.  Patient also has to have follow-up GI appointment for possible EGD which was previously deferred due to COVID-19-hoping we can get him back on aspirin sometime in the next few months.  He is currently chest pain and shortness of breath free though does have some generalized weakness.  He is compliant with simvastatin 20 mg daily.  Continue current medications for now-statin alone and waiting on aspirin until further evaluation by GI-we will follow up on this at next visit  Hypothyroidism Hopefully controlled on levothyroxine 50 mcg.  Patient agrees to come by for TSH.  Continue current medications for now.  Essential hypertension Blood pressure currently controlled on Lasix just as needed.  Previously has been on carvedilol and lisinopril.  May need to consider restarting with next visit with cardiology-I  am hesitant to restart given fall risk and Parkinson's without cardiology firm recommendation.  HYPERCHOLESTEROLEMIA Compliant with simvastatin 20 mg.  Continue current medication-get updated lipid panel with ideal LDL under 70 at least  Senile purpura ([place]) Noted continued issues with easy bruising and bleeding-not on aspirin at present as needs further GI work-up but still has issues  Atherosclerosis of aorta ([facility]) Continue risk factor modification with blood pressure control and statin therapy   Recommended follow up: We did not schedule plan follow-up but every 3 months would be reasonable or sooner if needed Future Appointments  Date Time Provider [place]  08/31/2019 10:45 AM Tat, Eustace Quail, DO [facility] None  11/17/2019  2:30 PM Tat, Eustace Quail, DO [facility] None    Lab/Order associations:   ICD-10-CM   1. Hypothyroidism due to acquired atrophy of thyroid  E03.4 TSH  2. HYPERCHOLESTEROLEMIA  E78.00 Lipid panel    CBC with Differential/Platelet    Comprehensive metabolic panel  3. Anemia, unspecified type  D64.9 IBC + Ferritin  4. Parkinson's disease ([place])  G20   5. Cellulitis of left leg  L03.116   6. Coronary artery disease involving native coronary artery of native heart without angina pectoris  I25.10   7. Essential hypertension  I10   8. Senile purpura ([facility])  D69.2   9. Atherosclerosis of aorta ([facility])  I70.0    Time Spent: 46  minutes of total time (11:48 AM- 12:20 PM, 12:44 PM- 12:58 PM) was spent on the date of the encounter performing the following actions: chart review prior to seeing the patient, obtaining history, performing a medically necessary exam, counseling on the treatment plan, placing orders, and documenting in our EHR.   Return precautions advised.  Garret Reddish, MD

## 2019-08-16 NOTE — Assessment & Plan Note (Signed)
New diagnosis of Parkinson's after seeing Dr. Herbert Moors has follow-up in May.  He was started on carbidopa levodopa and family has noted some improvement in his symptoms-he seems to have more expression in his face and be somewhat more mobile-he does require a walker for walking and significant support.  If daughter is not around there primarily using a wheelchair at home.

## 2019-08-16 NOTE — Assessment & Plan Note (Signed)
Compliant with simvastatin 20 mg.  Continue current medication-get updated lipid panel with ideal LDL under 70 at least

## 2019-08-16 NOTE — Assessment & Plan Note (Signed)
Hopefully controlled on levothyroxine 50 mcg.  Patient agrees to come by for TSH.  Continue current medications for now.

## 2019-08-16 NOTE — Assessment & Plan Note (Signed)
Patient was taken off of aspirin due to prior anemia-we are going to update labs to make sure not further anemic.  Patient also has to have follow-up GI appointment for possible EGD which was previously deferred due to COVID-19-hoping we can get him back on aspirin sometime in the next few months.  He is currently chest pain and shortness of breath free though does have some generalized weakness.  He is compliant with simvastatin 20 mg daily.  Continue current medications for now-statin alone and waiting on aspirin until further evaluation by GI-we will follow up on this at next visit

## 2019-08-29 NOTE — Progress Notes (Signed)
Virtual Visit via Video Note The purpose of this virtual visit is to provide medical care while limiting exposure to the novel coronavirus.    Consent was obtained for video visit:  Yes.   Answered questions that patient had about telehealth interaction:  Yes.   I discussed the limitations, risks, security and privacy concerns of performing an evaluation and management service by telemedicine. I also discussed with the patient that there may be a patient responsible charge related to this service. The patient expressed understanding and agreed to proceed.  Pt location: Home Physician Location: home Name of referring provider:  [name], MD I connected with Zachary Decker at patients initiation/request on 08/31/2019 at 10:45 AM EST by video enabled telemedicine application and verified that I am speaking with the correct person using two identifiers. Pt MRN:  [id number] Pt DOB:  1932/10/29 Video Participants:  Zachary Decker;  Daughter, Olivia Mackie supplements hx   History of Present Illness:  Patient seen today in follow-up for newly diagnosed Parkinson's disease.  Started on levodopa last visit.  Patient states that "I feel a little stronger."  Daughter states that the medication has "definitely" made a difference.  He is more alert.  Eyes are open more.  Last start hesitation.  Still needs help in and out of the bed.  He is still doing the exercises that PT left.   Pt denies falls. No cramping of the feet and legs at night.   Pt denies lightheadedness, near syncope.  No hallucinations.  Mood has been good.  Having drooling.  Medical records are reviewed.  Patient was seen on February 11 with primary care for cellulitis of the left leg.  Current movement d/o meds: carbidopa/levodopa 25/100 tid    Current Outpatient Medications on File Prior to Visit  Medication Sig Dispense Refill  . Acetaminophen (TYLENOL EXTRA STRENGTH PO) Take 1-2 tablets by mouth daily as needed (for back pain).     . B Complex-C-Folic Acid (FOLBEE PLUS) TABS TAKE 1 TABLET BY MOUTH EVERY DAY 90 tablet 1  . ferrous sulfate 325 (65 FE) MG EC tablet TAKE 1 TABLET BY MOUTH TWICE A DAY (Patient taking differently: Take 325 mg by mouth every other day. ) 180 tablet 1  . furosemide (LASIX) 20 MG tablet Take 20mg  2-3x/week as needed for leg swelling 20 tablet 0  . levothyroxine (SYNTHROID) 50 MCG tablet TAKE 1 TABLET BY MOUTH DAILY BEFORE BREAKFAST 90 tablet 1  . nitroGLYCERIN (NITROSTAT) 0.4 MG SL tablet Place 1 tablet (0.4 mg total) under the tongue every 5 (five) minutes as needed for chest pain. 20 tablet 1  . pantoprazole (PROTONIX) 40 MG tablet TAKE 1 TABLET BY MOUTH TWICE A DAY 180 tablet 1  . simvastatin (ZOCOR) 20 MG tablet TAKE 1 TABLET BY MOUTH EVERYDAY AT BEDTIME 90 tablet 3  . tamsulosin (FLOMAX) 0.4 MG CAPS capsule TAKE 1 CAPSULE BY MOUTH EVERY DAY 90 capsule 3  . vitamin B-12 (CYANOCOBALAMIN) 500 MCG tablet Take 500 mcg by mouth daily.     No current facility-administered medications on file prior to visit.     Observations/Objective:   Vitals:   08/31/19 0827  Weight: 185 lb (83.9 kg)  Height: 6\' 1"  (1.854 m)   GEN:  The patient appears stated age and is in NAD.  Neurological examination:  Orientation: The patient is alert and oriented x3. Cranial nerves: There is good facial symmetry. There is significant facial hypomimia.  The speech is fluent and  clear. Soft palate rises symmetrically and there is no tongue deviation. Hearing is intact to conversational tone. Motor: Strength is at least antigravity x 4.   Shoulder shrug is equal and symmetric.  There is no pronator drift.  Movement examination: Tone: unable Abnormal movements: None seen Coordination:  There is  decremation with RAM's, with any form of RAMS, including alternating supination and pronation of the forearm, hand opening and closing, finger taps, right more than left, but much better than previous Gait and Station: The  patient requires assistance out of the lift chair.  He is given a walker.  He is flexed almost 90 degrees at the waist.  He is short stepped.  He turns en bloc.  He freezes in the turn.    Assessment and Plan:   1.  Parkinsons Disease  -Increase carbidopa/levodopa 25/100, 1 tablet at 7 AM/11 AM/3 PM/7 PM  -Discussed safe, cardiovascular exercise  -Discussed home safety  -LP still an option if we don't think above helpful.    2.  Sialorrhea  -Discussed treatment options.  Discussed Botox.  He will think about this  3.  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  -Patient states better since starting levodopa.  We did discuss Botox, but sounds like he does not need that.  Follow Up Instructions:  Patient has a visit already set up in May.  He will keep that.  -I discussed the assessment and treatment plan with the patient. The patient was provided an opportunity to ask questions and all were answered. The patient agreed with the plan and demonstrated an understanding of the instructions.   The patient was advised to call back or seek an in-person evaluation if the symptoms worsen or if the condition fails to improve as anticipated.    Total time spent on today's visit was 30 minutes, including both face-to-face time and nonface-to-face time.  Time included that spent on review of records (prior notes available to me/labs/imaging if pertinent), discussing treatment and goals, answering patient's questions and coordinating care.   Alonza Bogus, DO

## 2019-08-31 ENCOUNTER — Telehealth (INDEPENDENT_AMBULATORY_CARE_PROVIDER_SITE_OTHER): Payer: Medicare PPO | Admitting: Neurology

## 2019-08-31 ENCOUNTER — Encounter: Payer: Self-pay | Admitting: Neurology

## 2019-08-31 ENCOUNTER — Other Ambulatory Visit: Payer: Self-pay

## 2019-08-31 VITALS — Ht 73.0 in | Wt 185.0 lb

## 2019-08-31 DIAGNOSIS — R482 Apraxia: Secondary | ICD-10-CM

## 2019-08-31 DIAGNOSIS — G20 Parkinson's disease: Secondary | ICD-10-CM

## 2019-08-31 DIAGNOSIS — K117 Disturbances of salivary secretion: Secondary | ICD-10-CM | POA: Diagnosis not present

## 2019-08-31 MED ORDER — CARBIDOPA-LEVODOPA 25-100 MG PO TABS: 1.0000 | ORAL_TABLET | Freq: Four times a day (QID) | ORAL | 1 refills | Status: DC

## 2019-09-09 ENCOUNTER — Other Ambulatory Visit: Payer: Medicare PPO

## 2019-09-12 ENCOUNTER — Other Ambulatory Visit (INDEPENDENT_AMBULATORY_CARE_PROVIDER_SITE_OTHER): Payer: Medicare PPO

## 2019-09-12 ENCOUNTER — Other Ambulatory Visit: Payer: Self-pay

## 2019-09-12 DIAGNOSIS — L03116 Cellulitis of left lower limb: Secondary | ICD-10-CM

## 2019-09-12 DIAGNOSIS — D649 Anemia, unspecified: Secondary | ICD-10-CM | POA: Diagnosis not present

## 2019-09-12 DIAGNOSIS — I2510 Atherosclerotic heart disease of native coronary artery without angina pectoris: Secondary | ICD-10-CM

## 2019-09-12 DIAGNOSIS — E034 Atrophy of thyroid (acquired): Secondary | ICD-10-CM | POA: Diagnosis not present

## 2019-09-12 DIAGNOSIS — I255 Ischemic cardiomyopathy: Secondary | ICD-10-CM

## 2019-09-12 DIAGNOSIS — E7800 Pure hypercholesterolemia, unspecified: Secondary | ICD-10-CM | POA: Diagnosis not present

## 2019-09-12 DIAGNOSIS — I10 Essential (primary) hypertension: Secondary | ICD-10-CM

## 2019-09-12 LAB — COMPREHENSIVE METABOLIC PANEL
ALT: 3 U/L (ref 0–53)
AST: 19 U/L (ref 0–37)
Albumin: 3.6 g/dL (ref 3.5–5.2)
Alkaline Phosphatase: 55 U/L (ref 39–117)
BUN: 26 mg/dL — ABNORMAL HIGH (ref 6–23)
CO2: 29 mEq/L (ref 19–32)
Calcium: 8.7 mg/dL (ref 8.4–10.5)
Chloride: 102 mEq/L (ref 96–112)
Creatinine, Ser: 1.15 mg/dL (ref 0.40–1.50)
GFR: 60.55 mL/min (ref >60.00)
Glucose, Bld: 83 mg/dL (ref 70–99)
Potassium: 4 mEq/L (ref 3.5–5.1)
Sodium: 138 mEq/L (ref 135–145)
Total Bilirubin: 0.8 mg/dL (ref 0.2–1.2)
Total Protein: 6.7 g/dL (ref 6.0–8.3)

## 2019-09-12 LAB — CBC WITH DIFFERENTIAL/PLATELET
Basophils Absolute: 0.1 10*3/uL (ref 0.0–0.1)
Basophils Relative: 1 % (ref 0.0–3.0)
Eosinophils Absolute: 0.7 10*3/uL (ref 0.0–0.7)
Eosinophils Relative: 7.9 % — ABNORMAL HIGH (ref 0.0–5.0)
HCT: 34.4 % — ABNORMAL LOW (ref 39.0–52.0)
Hemoglobin: 11.8 g/dL — ABNORMAL LOW (ref 13.0–17.0)
Lymphocytes Relative: 22 % (ref 12.0–46.0)
Lymphs Abs: 2 10*3/uL (ref 0.7–4.0)
MCHC: 34.2 g/dL (ref 30.0–36.0)
MCV: 100.4 fl — ABNORMAL HIGH (ref 78.0–100.0)
Monocytes Absolute: 1 10*3/uL (ref 0.1–1.0)
Monocytes Relative: 11.3 % (ref 3.0–12.0)
Neutro Abs: 5.1 10*3/uL (ref 1.4–7.7)
Neutrophils Relative %: 57.8 % (ref 43.0–77.0)
Platelets: 218 10*3/uL (ref 150.0–400.0)
RBC: 3.43 Mil/uL — ABNORMAL LOW (ref 4.22–5.81)
RDW: 15.1 % (ref 11.5–15.5)
WBC: 8.9 10*3/uL (ref 4.0–10.5)

## 2019-09-12 LAB — LIPID PANEL
Cholesterol: 96 mg/dL (ref 0–200)
HDL: 36.2 mg/dL — ABNORMAL LOW (ref >39.00)
LDL Cholesterol: 49 mg/dL (ref 0–99)
NonHDL: 59.38
Total CHOL/HDL Ratio: 3
Triglycerides: 51 mg/dL (ref 0.0–149.0)
VLDL: 10.2 mg/dL (ref 0.0–40.0)

## 2019-09-12 LAB — IBC + FERRITIN
Ferritin: 634 ng/mL — ABNORMAL HIGH (ref 22.0–322.0)
Iron: 129 ug/dL (ref 42–165)
Saturation Ratios: 64 % — ABNORMAL HIGH (ref 20.0–50.0)
Transferrin: 144 mg/dL — ABNORMAL LOW (ref 212.0–360.0)

## 2019-09-12 LAB — TSH: TSH: 3.14 u[IU]/mL (ref 0.35–4.50)

## 2019-09-13 ENCOUNTER — Other Ambulatory Visit: Payer: Self-pay

## 2019-09-13 MED ORDER — FUROSEMIDE 20 MG PO TABS: ORAL_TABLET | ORAL | 5 refills | Status: DC

## 2019-09-13 MED ORDER — FUROSEMIDE 20 MG PO TABS: ORAL_TABLET | ORAL | 1 refills | Status: DC

## 2019-09-13 NOTE — Progress Notes (Signed)
Notes from Dr. Yong Channel Anemia has much improved-he may stop the iron

## 2019-09-13 NOTE — Progress Notes (Signed)
Notes from Dr. Yong Channel He may continue Lasix 2-3 times a week-you may refill this for him #30 with 5 refills at 20 mg dose

## 2019-09-18 ENCOUNTER — Encounter: Payer: Self-pay | Admitting: Family Medicine

## 2019-09-20 MED ORDER — HYDROXYZINE HCL 10 MG PO TABS: 10.0000 mg | ORAL_TABLET | Freq: Every evening | ORAL | 0 refills | Status: DC | PRN

## 2019-10-13 ENCOUNTER — Other Ambulatory Visit: Payer: Self-pay | Admitting: Family Medicine

## 2019-10-13 ENCOUNTER — Encounter: Payer: Self-pay | Admitting: Family Medicine

## 2019-10-22 ENCOUNTER — Other Ambulatory Visit: Payer: Self-pay | Admitting: Family Medicine

## 2019-10-22 DIAGNOSIS — I10 Essential (primary) hypertension: Secondary | ICD-10-CM

## 2019-10-27 ENCOUNTER — Other Ambulatory Visit: Payer: Self-pay

## 2019-10-27 MED ORDER — FOLBEE PLUS PO TABS: 1.0000 | ORAL_TABLET | Freq: Every day | ORAL | 1 refills | Status: DC

## 2019-10-28 ENCOUNTER — Encounter: Payer: Self-pay | Admitting: Family Medicine

## 2019-10-28 ENCOUNTER — Telehealth: Payer: Self-pay | Admitting: Family Medicine

## 2019-10-28 ENCOUNTER — Telehealth (INDEPENDENT_AMBULATORY_CARE_PROVIDER_SITE_OTHER): Payer: Medicare PPO | Admitting: Family Medicine

## 2019-10-28 VITALS — BP 130/75 | Ht 73.0 in | Wt 190.0 lb

## 2019-10-28 DIAGNOSIS — I255 Ischemic cardiomyopathy: Secondary | ICD-10-CM

## 2019-10-28 DIAGNOSIS — N183 Chronic kidney disease, stage 3 unspecified: Secondary | ICD-10-CM

## 2019-10-28 DIAGNOSIS — L03115 Cellulitis of right lower limb: Secondary | ICD-10-CM

## 2019-10-28 DIAGNOSIS — I10 Essential (primary) hypertension: Secondary | ICD-10-CM

## 2019-10-28 MED ORDER — CEPHALEXIN 500 MG PO CAPS: 500.0000 mg | ORAL_CAPSULE | Freq: Two times a day (BID) | ORAL | 0 refills | Status: DC

## 2019-10-28 NOTE — Progress Notes (Deleted)
Duplicate note

## 2019-10-28 NOTE — Progress Notes (Signed)
Phone [phone] Virtual visit via Video note   Subjective:  Chief complaint: Chief Complaint  Patient presents with  . Follow-up    virtual  . swelling in feet   This visit type was conducted due to national recommendations for restrictions regarding the COVID-19 Pandemic (e.g. social distancing).  This format is felt to be most appropriate for this patient at this time balancing risks to patient and risks to population by having him in for in person visit.  No physical exam was performed (except for noted visual exam or audio findings with Telehealth visits).    Our team/I connected with Zachary Decker at  2:20 PM EDT by a video enabled telemedicine application (doxy.me or caregility through epic) and verified that I am speaking with the correct person using two identifiers.  Location patient: [facility] Location provider: [place] Decker, office Persons participating in the virtual visit:  patient  Our team/I discussed the limitations of evaluation and management by telemedicine and the availability of in person appointments. In light of current covid-19 pandemic, patient also understands that we are trying to protect them by minimizing in office contact if at all possible.  The patient expressed consent for telemedicine visit and agreed to proceed. Patient understands insurance will be billed.   Past Medical History-  Patient Active Problem List   Diagnosis Date Noted  . Duodenal ulcer     Priority: High  . MSSA bacteremia 04/25/2019    Priority: High  . Cellulitis of right leg 04/20/2019    Priority: High  . Parkinson's disease ([place]) 04/13/2018    Priority: High  . CAD w/ hx MI s/p CABG 09/07/2012    Priority: High  . Cardiomyopathy, ischemic 03/22/2010    Priority: High  . Atherosclerosis of aorta ([place]) 08/16/2019    Priority: Medium  . Low back pain 04/19/2015    Priority: Medium  . CKD (chronic kidney disease), stage III 04/20/2014    Priority: Medium  . Anxiety state  04/17/2014    Priority: Medium  . HYPERCHOLESTEROLEMIA 10/31/2008    Priority: Medium  . BPH (benign prostatic hyperplasia) 09/28/2007    Priority: Medium  . Hypothyroidism 03/26/2007    Priority: Medium  . Essential hypertension 03/22/2007    Priority: Medium  . Senile purpura ([place]) 04/13/2018    Priority: Low  . ANEMIA, VITAMIN B12 DEFICIENCY 04/22/2010    Priority: Low  . Actinic keratosis 06/25/2009    Priority: Low  . BASAL CELL CARCINOMA, NOSE 02/05/2009    Priority: Low  . Localized osteoarthrosis, lower leg 12/06/2008    Priority: Low  . GERD 03/22/2007    Priority: Low  . Dehydration 04/20/2019  . Generalized weakness 04/20/2019  . Cellulitis of left foot   . Cellulitis of left leg 10/07/2018  . Insomnia 04/13/2018    Medications- reviewed and updated Current Outpatient Medications  Medication Sig Dispense Refill  . Acetaminophen (TYLENOL EXTRA STRENGTH PO) Take 1-2 tablets by mouth daily as needed (for back pain).    . B Complex-C-Folic Acid (FOLBEE PLUS) TABS Take 1 tablet by mouth daily. 90 tablet 1  . carbidopa-levodopa (SINEMET) 25-100 MG tablet Take 1 tablet by mouth 4 (four) times daily. 1 at 7am/11am/3pm/7pm 360 tablet 1  . ferrous sulfate 325 (65 FE) MG EC tablet TAKE 1 TABLET BY MOUTH TWICE A DAY (Patient taking differently: Take 325 mg by mouth every other day. ) 180 tablet 1  . furosemide (LASIX) 20 MG tablet Take 20mg  2-3x/week as needed for  leg swelling 30 tablet 5  . hydrOXYzine (ATARAX/VISTARIL) 10 MG tablet TAKE 1 TABLET BY MOUTH AT BEDTIME AS NEEDED 90 tablet 1  . levothyroxine (SYNTHROID) 50 MCG tablet TAKE 1 TABLET BY MOUTH DAILY BEFORE BREAKFAST 90 tablet 1  . lisinopril (ZESTRIL) 2.5 MG tablet TAKE 1 TABLET BY MOUTH EVERY DAY 90 tablet 3  . nitroGLYCERIN (NITROSTAT) 0.4 MG SL tablet Place 1 tablet (0.4 mg total) under the tongue every 5 (five) minutes as needed for chest pain. 20 tablet 1  . pantoprazole (PROTONIX) 40 MG tablet TAKE 1 TABLET BY  MOUTH TWICE A DAY 180 tablet 1  . simvastatin (ZOCOR) 20 MG tablet TAKE 1 TABLET BY MOUTH EVERYDAY AT BEDTIME 90 tablet 3  . tamsulosin (FLOMAX) 0.4 MG CAPS capsule TAKE 1 CAPSULE BY MOUTH EVERY DAY 90 capsule 3  . vitamin B-12 (CYANOCOBALAMIN) 500 MCG tablet Take 500 mcg by mouth daily.    . cephALEXin (KEFLEX) 500 MG capsule Take 1 capsule (500 mg total) by mouth 2 (two) times daily. 14 capsule 0   No current facility-administered medications for this visit.     Objective:  BP 130/75   Ht 6\' 1"  (1.854 m)   Wt 190 lb (86.2 kg)   BMI 25.07 kg/m  self reported vitals Gen: NAD, resting comfortably Lungs: nonlabored, normal respiratory rate  Skin: appears dry, no obvious rash Increased edema from baseline in bilateral lower extremities-erythematous on bilateral lower legs with some scaling at the toes    Assessment and Plan     Cardiomyopathy, ischemic Most recent ejection fraction 45 to 50% in October.  Patient is compliant with Lasix 20 mg 3 times a week.  Weight is up 5 pounds from last visit and has increased swelling in his legs.  We are going to increase Lasix to daily for a week. -Lisinopril and carvedilol has been held after most recent hospitalization for GI bleed when blood pressure was low -Patient also off of lisinopril since last hospitalization which he is going to go ahead and restart -He has follow-up with Dr. Angelena Decker within 2 weeks and they can make a decision to see if blood pressure can tolerate adding carvedilol back in   Essential hypertension Reasonable control blood pressure on Lasix 20 mg 3 times a week.  We are going to try to add his lisinopril 2.5 mg back in due to prior increased CHF-not necessarily for blood pressure control.  He is going to chat with Dr. Julianne Decker at next visit about adding carvedilol back in.  Cellulitis of right leg #Cellulitis of the legs-appears to be bilateral but will list under right leg S: pt c/o swelling in bilateral feet that  started in the last few days.  He has noted increased redness and some scaling of the toes.  Patient is compliant with Lasix 3 times a day-usually wears compression stockings.  His weight is up 5 pounds from last visit A/P: For cellulitis-patient with history of significant cellulitis leading to hospitalization.  It is obviously atypical to have bilateral cellulitis but patient consistently has improvement with courses of Keflex-last episode 3 months ago.  We opted to treat for 7 days with Keflex twice a day and also plan to try to increase swelling as below -I think this is particularly important before the weekend to try to reduce risk of hospitalization-in the past cellulitis has become severe enough that he has had difficulty walking  CKD (chronic kidney disease), stage III Most recent GFR right at 60.  I am still going to renally adjust his Keflex to 500 mg twice daily-could try to increase to 3 times a day if no improvement by next week   Recommended follow up: As needed if fails to improve-recommended 1 week follow-up if not improving or sooner if worsens.  No signs of being systemically ill such as fever/chills/nausea/vomiting/difficulty walking at present. Future Appointments  Date Time Provider [place]  11/09/2019  4:00 PM Burnell Blanks, MD [facility]  11/17/2019  2:30 PM Tat, Eustace Quail, DO [facility] None    Lab/Order associations:   ICD-10-CM   1. Cellulitis of right leg  L03.115   2. Cardiomyopathy, ischemic  I25.5   3. Essential hypertension  I10   4. Stage 3 chronic kidney disease, unspecified whether stage 3a or 3b CKD  N18.30     Meds ordered this encounter  Medications  . cephALEXin (KEFLEX) 500 MG capsule    Sig: Take 1 capsule (500 mg total) by mouth 2 (two) times daily.    Dispense:  14 capsule    Refill:  0   Return precautions advised.  Garret Reddish, MD

## 2019-10-28 NOTE — Assessment & Plan Note (Signed)
Most recent GFR right at 60.  I am still going to renally adjust his Keflex to 500 mg twice daily-could try to increase to 3 times a day if no improvement by next week

## 2019-10-28 NOTE — Telephone Encounter (Signed)
Pt being seen today

## 2019-10-28 NOTE — Assessment & Plan Note (Signed)
Reasonable control blood pressure on Lasix 20 mg 3 times a week.  We are going to try to add his lisinopril 2.5 mg back in due to prior increased CHF-not necessarily for blood pressure control.  He is going to chat with Dr. Julianne Handler at next visit about adding carvedilol back in.

## 2019-10-28 NOTE — Assessment & Plan Note (Signed)
Most recent ejection fraction 45 to 50% in October.  Patient is compliant with Lasix 20 mg 3 times a week.  Weight is up 5 pounds from last visit and has increased swelling in his legs.  We are going to increase Lasix to daily for a week. -Lisinopril and carvedilol has been held after most recent hospitalization for GI bleed when blood pressure was low -Patient also off of lisinopril since last hospitalization which he is going to go ahead and restart -He has follow-up with Dr. Angelena Form within 2 weeks and they can make a decision to see if blood pressure can tolerate adding carvedilol back in

## 2019-10-28 NOTE — Telephone Encounter (Signed)
Chief Complaint Feet swelling Reason for Call Symptomatic / Request for [place] states her dad is having swelling in his feet. Duration "a while" per daughter. Translation No Nurse Assessment Nurse: Raphael Gibney, RN, Vanita Ingles Date/Time (Eastern Time): 10/28/2019 11:08:42 AM Confirm and document reason for call. If symptomatic, describe symptoms. ---Caller states her father has had swelling in both feet and ankles for a while for a couple of weeks. he can bear weight on his feet. No redness. not warm to the touch. Has the patient had close contact with a person known or suspected to have the novel coronavirus illness OR traveled / lives in area with major community spread (including international travel) in the last 14 days from the onset of symptoms? * If Asymptomatic, screen for exposure and travel within the last 14 days. ---No Does the patient have any new or worsening symptoms? ---Yes Will a triage be completed? ---Yes Related visit to physician within the last 2 weeks? ---No Does the PT have any chronic conditions? (i.e. diabetes, asthma, this includes High risk factors for pregnancy, etc.) ---Yes List chronic conditions. ---parkinson's Is this a behavioral health or substance abuse call? ---No Guidelines Guideline Title Affirmed Question Affirmed Notes Nurse Date/Time Eilene Ghazi Time) Leg Swelling and Edema [1] MILD swelling of both ankles (i.e., pedal edema) AND [2] new onset or worsening Pryor Montes 10/28/2019 11:24:02 AMPLEASE NOTE: All timestamps contained within this report are represented as [place] Standard Time. CONFIDENTIALTY NOTICE: This fax transmission is intended only for the addressee. It contains information that is legally privileged, confidential or otherwise protected from use or disclosure. If you are not the intended recipient, you are strictly prohibited from reviewing, disclosing, copying using or disseminating any of this  information or taking any action in reliance on or regarding this information. If you have received this fax in error, please notify us immediately by telephone so that we can arrange for its return to [place]. Phone: [phone], Toll-Free: [phone], Fax: [phone] Page: 2 of 2 Call Id: [id number] [place]. Time Eilene Ghazi Time) Disposition Final User 10/28/2019 11:27:00 AM SEE PCP WITHIN 3 DAYS Yes Raphael Gibney, RN, Vanita Ingles Caller Disagree/Comply Comply Caller Understands Yes PreDisposition Call Doctor Care Advice Given Per Guideline SEE PCP WITHIN 3 DAYS: * You need to be seen within 2 or 3 days. Call your doctor (or NP/PA) during regular office hours and make an appointment. A clinic or urgent care center are good places to go for care if your doctor's office is closed or you can't get an appointment. NOTE: If office will be open tomorrow, tell caller to call then, not in 3 days. CALL BACK IF: * Calf pain occurs and becomes constant * You become worse. CARE ADVICE given per Leg Swelling and Edema (Adult) guideline. Comments User: Dannielle Burn, RN Date/Time Eilene Ghazi Time): 10/28/2019 11:26:34 AM warm transferred to the office for telehealth visit. Referrals REFERRED TO PCP OFFICE

## 2019-10-28 NOTE — Assessment & Plan Note (Signed)
#  Cellulitis of the legs-appears to be bilateral but will list under right leg S: pt c/o swelling in bilateral feet that started in the last few days.  He has noted increased redness and some scaling of the toes.  Patient is compliant with Lasix 3 times a day-usually wears compression stockings.  His weight is up 5 pounds from last visit A/P: For cellulitis-patient with history of significant cellulitis leading to hospitalization.  It is obviously atypical to have bilateral cellulitis but patient consistently has improvement with courses of Keflex-last episode 3 months ago.  We opted to treat for 7 days with Keflex twice a day and also plan to try to increase swelling as below -I think this is particularly important before the weekend to try to reduce risk of hospitalization-in the past cellulitis has become severe enough that he has had difficulty walking

## 2019-11-04 ENCOUNTER — Telehealth: Payer: Self-pay | Admitting: Cardiovascular Disease

## 2019-11-04 NOTE — Telephone Encounter (Signed)
Noted  

## 2019-11-04 NOTE — Telephone Encounter (Signed)
Patient's daughter called stating she needs to come with her father to his upcoming appt as he is in a wheelchair and she is his caregiver.

## 2019-11-09 ENCOUNTER — Encounter: Payer: Self-pay | Admitting: Cardiovascular Disease

## 2019-11-09 ENCOUNTER — Ambulatory Visit: Payer: Medicare PPO | Admitting: Cardiovascular Disease

## 2019-11-09 ENCOUNTER — Other Ambulatory Visit: Payer: Self-pay

## 2019-11-09 VITALS — BP 134/70 | HR 59 | Ht 73.0 in | Wt 217.0 lb

## 2019-11-09 DIAGNOSIS — L03116 Cellulitis of left lower limb: Secondary | ICD-10-CM | POA: Diagnosis not present

## 2019-11-09 MED ORDER — CEPHALEXIN 500 MG PO CAPS: 500.0000 mg | ORAL_CAPSULE | Freq: Two times a day (BID) | ORAL | 0 refills | Status: DC

## 2019-11-09 MED ORDER — FUROSEMIDE 20 MG PO TABS
ORAL_TABLET | ORAL | 0 refills | Status: DC
Start: 2019-11-09 — End: 2019-11-17

## 2019-11-09 NOTE — Progress Notes (Signed)
Chief Complaint  Patient presents with  . Follow-up    CAD    History of Present Illness: 86 yo male with history of HTN, HLD, CAD s/p 5V CABG March 2010, ischemic cardiomyopathy, GERD, BPH here today for follow up. He was admitted March 2010 with an inferior MI complicated by VF arrest multiple times and unsuccessful attempt at PCI in the setting of severe multi-vessel CAD. He then had an urgent 5 V CABG. He is known to have a cardiomyopathy with last echo 2013 showing LVEF 35-40%. Stress myoview 03/08/13 with scar but no ischemia, LVEF=46%.He has refused to repeat the echo over the past few years. He was admitted to [facility] April 2020 with left leg cellulitis and sepsis with generalized weakness. Recent diagnosis of Parkinson's disease.   He is here today for follow up. The patient denies any chest pain, dyspnea, palpitations, orthopnea, PND, dizziness, near syncope or syncope. He continue to have LE edema. Taking Lasix 20 mg every other day. Just finished treatment for cellulitis under orders from Dr. Yong Channel. (Keflex 500 mg po BID for one week). Still was redness over both legs. Left leg with several small erosions.   Primary Care Physician: [name], MD  Past Medical History:  Diagnosis Date  . Acute myocardial infarction of inferoposterior wall, subsequent episode of care ([place])   . CAD (coronary artery disease)   . Encounter for long-term (current) use of other medications   . Family history of malignant neoplasm of gastrointestinal tract   . GERD (gastroesophageal reflux disease)   . Hypercholesterolemia   . Hypertension   . Hypertrophy of prostate with urinary obstruction and other lower urinary tract symptoms (LUTS)   . Personal history of thrombophlebitis   . PVD (peripheral vascular disease) ([place])   . Respiratory failure ([place])   . Unspecified adverse effect of unspecified drug, medicinal and biological substance   . Unspecified hypothyroidism     Past Surgical History:   Procedure Laterality Date  . BIOPSY  04/28/2019   Procedure: BIOPSY;  Surgeon: Thornton Park, MD;  Location: [place];  Service: Gastroenterology;;  . CATARACT EXTRACTION, BILATERAL    . CHOLECYSTECTOMY  02/26/2012   Procedure: LAPAROSCOPIC CHOLECYSTECTOMY WITH INTRAOPERATIVE CHOLANGIOGRAM;  Surgeon: Madilyn Hook, DO;  Location: [place];  Service: General;  Laterality: N/A;  . CORONARY ARTERY BYPASS GRAFT     x 5 on August 29, 2008  . ESOPHAGOGASTRODUODENOSCOPY (EGD) WITH PROPOFOL N/A 04/28/2019   Procedure: ESOPHAGOGASTRODUODENOSCOPY (EGD) WITH PROPOFOL;  Surgeon: Thornton Park, MD;  Location: [place];  Service: Gastroenterology;  Laterality: N/A;  . REPLACEMENT TOTAL KNEE Left   . tachecostomy placement     after heart attack  . TONSILLECTOMY      Current Outpatient Medications  Medication Sig Dispense Refill  . Acetaminophen (TYLENOL EXTRA STRENGTH PO) Take 1-2 tablets by mouth daily as needed (for back pain).    . B Complex-C-Folic Acid (FOLBEE PLUS) TABS Take 1 tablet by mouth daily. 90 tablet 1  . carbidopa-levodopa (SINEMET) 25-100 MG tablet Take 1 tablet by mouth 4 (four) times daily. 1 at 7am/11am/3pm/7pm 360 tablet 1  . cephALEXin (KEFLEX) 500 MG capsule Take 1 capsule (500 mg total) by mouth 2 (two) times daily. 14 capsule 0  . ferrous sulfate 325 (65 FE) MG EC tablet TAKE 1 TABLET BY MOUTH TWICE A DAY (Patient taking differently: Take 325 mg by mouth every other day. ) 180 tablet 1  . hydrOXYzine (ATARAX/VISTARIL) 10 MG tablet TAKE 1  TABLET BY MOUTH AT BEDTIME AS NEEDED 90 tablet 1  . levothyroxine (SYNTHROID) 50 MCG tablet TAKE 1 TABLET BY MOUTH DAILY BEFORE BREAKFAST 90 tablet 1  . lisinopril (ZESTRIL) 2.5 MG tablet TAKE 1 TABLET BY MOUTH EVERY DAY 90 tablet 3  . nitroGLYCERIN (NITROSTAT) 0.4 MG SL tablet Place 1 tablet (0.4 mg total) under the tongue every 5 (five) minutes as needed for chest pain. 20 tablet 1  . pantoprazole (PROTONIX) 40 MG tablet TAKE 1 TABLET  BY MOUTH TWICE A DAY 180 tablet 1  . simvastatin (ZOCOR) 20 MG tablet TAKE 1 TABLET BY MOUTH EVERYDAY AT BEDTIME 90 tablet 3  . tamsulosin (FLOMAX) 0.4 MG CAPS capsule TAKE 1 CAPSULE BY MOUTH EVERY DAY 90 capsule 3  . vitamin B-12 (CYANOCOBALAMIN) 500 MCG tablet Take 500 mcg by mouth daily.    . cephALEXin (KEFLEX) 500 MG capsule Take 1 capsule (500 mg total) by mouth 2 (two) times daily for 7 days. 14 capsule 0  . furosemide (LASIX) 20 MG tablet Take 40 mg daily for 7 days, then take 20 mg daily 105 tablet 0   No current facility-administered medications for this visit.    Allergies  Allergen Reactions  . Losartan Potassium Other (See Comments)    Not known  . Naproxen Nausea And Vomiting  . Penicillins Rash    Did it involve swelling of the face/tongue/throat, SOB, or low BP? No Did it involve sudden or severe rash/hives, skin peeling, or any reaction on the inside of your mouth or nose? Yes Did you need to seek medical attention at a hospital or doctor's office? Unknown When did it last happen? If all above answers are "NO", may proceed with cephalosporin use.  TOLERATED CEFTRIAXONE 10/07/2018    Social History   Socioeconomic History  . Marital status: Married    Spouse name: Apolonio Schneiders  . Number of children: 2  . Years of education: Not on file  . Highest education level: Master's degree (e.g., MA, MS, MEng, MEd, MSW, MBA)  Occupational History    Comment: Retired Careers adviser   Tobacco Use  . Smoking status: Never Smoker  . Smokeless tobacco: Never Used  Substance and Sexual Activity  . Alcohol use: No  . Drug use: No  . Sexual activity: Not on file  Other Topics Concern  . Not on file  Social History Narrative   Married 1958. 2 kids (boy, girl). 2 grandkids (boy and girl).       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    Social Determinants of Health   Financial Resource Strain:   . Difficulty of Paying Living Expenses:   Food Insecurity:   . Worried About Charity fundraiser in the Last Year:   . Arboriculturist in the Last Year:   Transportation Needs:   . Film/video editor (Medical):   Marland Kitchen Lack of Transportation (Non-Medical):   Physical Activity:   . Days of Exercise per Week:   . Minutes of Exercise per Session:   Stress:   . Feeling of Stress :   Social Connections:   . Frequency of Communication with Friends and Family:   . Frequency of Social Gatherings with Friends and Family:   . Attends Religious Services:   . Active  Member of Clubs or Organizations:   . Attends Archivist Meetings:   Marland Kitchen Marital Status:   Intimate Partner Violence:   . Fear of Current or Ex-Partner:   . Emotionally Abused:   Marland Kitchen Physically Abused:   . Sexually Abused:     Family History  Problem Relation Age of Onset  . Heart disease Mother   . Tuberculosis Father   . Colon cancer Brother     Review of Systems:  As stated in the HPI and otherwise negative.   BP 134/70   Pulse (!) 59   Ht 6\' 1"  (1.854 m)   Wt 217 lb (98.4 kg)   SpO2 95%   BMI 28.63 kg/m   Physical Examination:  General: Well developed, well nourished, NAD  HEENT: OP clear, mucus membranes moist  SKIN: warm, dry. No rashes. Neuro: No focal deficits  Musculoskeletal: Muscle strength 5/5 all ext  Psychiatric: Mood and affect normal  Neck: No JVD, no carotid bruits, no thyromegaly, no lymphadenopathy.  Lungs:Clear bilaterally, no wheezes, rhonci, crackles Cardiovascular: Regular rate and rhythm. No murmurs, gallops or rubs. Abdomen:Soft. Bowel sounds present. Non-tender.  Extremities: 1-2 + bilateral LE edema with chronic venous stasis changes.   Echo 09/08/11:  Left ventricle: The cavity size was normal. Wall thickness was normal. Systolic function was moderately reduced. The estimated ejection fraction was in the  range of 35% to 40%. There is akinesis of the inferoposterior myocardium. Doppler parameters are consistent with abnormal left ventricular relaxation (grade 1 diastolic dysfunction). - Mitral valve: Mild regurgitation. - Left atrium: The atrium was mildly dilated.  EKG:  EKG is ordered today. The ekg ordered today demonstrates Sinus  Recent Labs: 04/30/2019: Magnesium 1.8 09/12/2019: ALT 3; BUN 26; Creatinine, Ser 1.15; Hemoglobin 11.8; Platelets 218.0; Potassium 4.0; Sodium 138; TSH 3.14   Lipid Panel    Component Value Date/Time   CHOL 96 09/12/2019 0850   TRIG 51.0 09/12/2019 0850   HDL 36.20 (L) 09/12/2019 0850   CHOLHDL 3 09/12/2019 0850   VLDL 10.2 09/12/2019 0850   LDLCALC 49 09/12/2019 0850   LDLDIRECT 60.0 04/08/2016 0925     Wt Readings from Last 3 Encounters:  11/09/19 217 lb (98.4 kg)  10/28/19 190 lb (86.2 kg)  08/31/19 185 lb (83.9 kg)     Other studies Reviewed: Additional studies/ records that were reviewed today include: . Review of the above records demonstrates:    Assessment and Plan:   1. CAD s/p CABG without angina: He has no chest pain. Continue ASA, statin and beta blocker.    2. Ischemic Cardiomyopathy:  Last LVEF 46% by stress myoview 2014. No findings of CHF. He does not want to repeat an echo. Continue Ace-inh and beta blocker  3. HTN: BP is well controlled. Continue current therapy  4. Hyperlipidemia: Lipids well controlled. LDL 49 in March 2021. Continue statin.   5. LE cellulitis: Will continue Keflex for one more week. (500 mg po BID)  6. Acute on chronic diastolic CHF: Will increase Lasix to 40 mg daily for one week then 20 mg daily. I will see him back in one week with BMET  Current medicines are reviewed at length with the patient today.  The patient does not have concerns regarding medicines.  The following changes have been made:  no change  Labs/ tests ordered today include:   No orders of the defined types were placed in  this encounter.   Disposition:   FU with me in  1-2 weeks.   Signed, Lauree Chandler, MD 11/09/2019 4:39 PM    [facility] [address], [place], [place]  [address] Phone: [phone]; Fax: [phone]

## 2019-11-09 NOTE — Patient Instructions (Signed)
Medication Instructions:  Your physician has recommended you make the following change in your medication:  1.) continue Keflex 500 mg twice a day for 7 more days 2.) change furosemide (Lasix) to 40 mg daily for 7 days and then decrease to 20 mg daily  *If you need a refill on your cardiac medications before your next appointment, please call your pharmacy*   Lab Work: none If you have labs (blood work) drawn today and your tests are completely normal, you will receive your results only by: Marland Kitchen MyChart Message (if you have MyChart) OR . A paper copy in the mail If you have any lab test that is abnormal or we need to change your treatment, we will call you to review the results.   Testing/Procedures: none   Follow-Up: At [facility], you and your health needs are our priority.  As part of our continuing mission to provide you with exceptional heart care, we have created designated Provider Care Teams.  These Care Teams include your primary Cardiologist (physician) and Advanced Practice Providers (APPs -  Physician Assistants and Nurse Practitioners) who all work together to provide you with the care you need, when you need it.  Your next appointment:   1 week(s)  The format for your next appointment:   In Person  Provider:   Lauree Chandler, MD   Other Instructions

## 2019-11-15 ENCOUNTER — Telehealth: Payer: Self-pay | Admitting: Cardiovascular Disease

## 2019-11-15 NOTE — Telephone Encounter (Signed)
New Message    Pts daughter is calling and says she will need to assist the pt to his appointment because he is in a hweel chair    Please advise

## 2019-11-15 NOTE — Progress Notes (Signed)
Assessment/Plan:   1.  Parkinsons Disease  -Continue carbidopa/levodopa 25/100, 1 tablet 4 times per day.  Discussed with patient whether or not we should increase his levodopa.  He was fairly rigid today, but it was time for him to take a dose.  He ultimately decided to hold off on taking more medication.  He states that he feels much better than he did previously before medication.  -We did decide to add carbidopa/levodopa 50/200 at bedtime.  He is hoping that this will help him roll over in the bed, and help him in the morning.  -refer for [facility]  2.  Abnormal brain scan  -Decided to hold off on LP, given that levodopa has been evaluated.  3.  Eyelid opening apraxia  -Does not want to pursue Botox.  4.  Sialorrhea  -This is commonly associated with PD.  We talked about treatments.  The patient is not a candidate for oral anticholinergic therapy because of increased risk of confusion and falls.  We discussed Botox (type A and B) and 1% atropine drops.  We discusssed that candy like lemon drops can help by stimulating mm of the oropharynx to induce swallowing. Subjective:   Zachary Decker was seen today in follow up for Parkinsons disease.  My previous records were reviewed prior to todays visit as well as outside records available to me. Daughter states that he has been doing really well with it - "his personality has just come out."  Drooling has improved.  Eyes have been open.  He was put on "fluid medicine" last week and feels "so much better."    Current prescribed movement disorder medications: Carbidopa/levodopa 25/100, 1 tablet 4 times per day (increased last visit)  ALLERGIES:   Allergies  Allergen Reactions  . Losartan Potassium Other (See Comments)    Not known  . Naproxen Nausea And Vomiting  . Penicillins Rash    Did it involve swelling of the face/tongue/throat, SOB, or low BP? No Did it involve sudden or severe rash/hives, skin peeling, or any reaction on  the inside of your mouth or nose? Yes Did you need to seek medical attention at a hospital or doctor's office? Unknown When did it last happen? If all above answers are "NO", may proceed with cephalosporin use.  TOLERATED CEFTRIAXONE 10/07/2018    CURRENT MEDICATIONS:  Outpatient Encounter Medications as of 11/17/2019  Medication Sig  . Acetaminophen (TYLENOL EXTRA STRENGTH PO) Take 1-2 tablets by mouth daily as needed (for back pain).  . B Complex-C-Folic Acid (FOLBEE PLUS) TABS Take 1 tablet by mouth daily.  . carbidopa-levodopa (SINEMET) 25-100 MG tablet Take 1 tablet by mouth 4 (four) times daily. 1 at 7am/11am/3pm/7pm  . ferrous sulfate 325 (65 FE) MG EC tablet TAKE 1 TABLET BY MOUTH TWICE A DAY (Patient taking differently: Take 325 mg by mouth every other day. )  . furosemide (LASIX) 40 MG tablet Take 1 tablet (40 mg total) by mouth daily.  . hydrOXYzine (ATARAX/VISTARIL) 10 MG tablet TAKE 1 TABLET BY MOUTH AT BEDTIME AS NEEDED  . levothyroxine (SYNTHROID) 50 MCG tablet TAKE 1 TABLET BY MOUTH DAILY BEFORE BREAKFAST  . lisinopril (ZESTRIL) 2.5 MG tablet TAKE 1 TABLET BY MOUTH EVERY DAY  . nitroGLYCERIN (NITROSTAT) 0.4 MG SL tablet Place 1 tablet (0.4 mg total) under the tongue every 5 (five) minutes as needed for chest pain.  . pantoprazole (PROTONIX) 40 MG tablet TAKE 1 TABLET BY MOUTH TWICE A DAY  . simvastatin (  ZOCOR) 20 MG tablet TAKE 1 TABLET BY MOUTH EVERYDAY AT BEDTIME  . tamsulosin (FLOMAX) 0.4 MG CAPS capsule TAKE 1 CAPSULE BY MOUTH EVERY DAY  . vitamin B-12 (CYANOCOBALAMIN) 500 MCG tablet Take 500 mcg by mouth daily.  . carbidopa-levodopa (SINEMET CR) 50-200 MG tablet Take 1 tablet by mouth at bedtime.  . [EXPIRED] cephALEXin (KEFLEX) 500 MG capsule Take 1 capsule (500 mg total) by mouth 2 (two) times daily for 7 days.  . [DISCONTINUED] cephALEXin (KEFLEX) 500 MG capsule Take 1 capsule (500 mg total) by mouth 2 (two) times daily. (Patient not taking: Reported on  11/17/2019)  . [DISCONTINUED] furosemide (LASIX) 20 MG tablet Take 40 mg daily for 7 days, then take 20 mg daily   No facility-administered encounter medications on file as of 11/17/2019.    Objective:   PHYSICAL EXAMINATION:    VITALS:   Vitals:   11/17/19 1423  BP: 115/61  Pulse: (!) 58  SpO2: 96%  Weight: 204 lb (92.5 kg)  Height: 6\' 1"  (1.854 m)    GEN:  The patient appears stated age and is in NAD. HEENT:  Normocephalic, atraumatic.  The mucous membranes are moist. The superficial temporal arteries are without ropiness or tenderness. CV: Bradycardic.  Regular Lungs:  CTAB Neck/HEME:  There are no carotid bruits bilaterally.  Neurological examination:  Orientation: The patient is alert and oriented x3. Cranial nerves: There is good facial symmetry with facial hypomimia. The speech is fluent and clear. Soft palate rises symmetrically and there is no tongue deviation. Hearing is intact to conversational tone. Sensation: Sensation is intact to light touch throughout Motor: Strength is at least antigravity x4.  Movement examination: Tone: There is mod increased tone in the UE.L>R (time for med) Abnormal movements: mild intermittent LUE rest tremor Coordination:  There is mild decremation with RAM's Gait and Station: The patient has difficulty arising out of a deep-seated chair without the use of the hands. He requires assist out of the chair.  The patient's stride length is decreased and he is stooped at waist.  He uses walker and gait belt.    I have reviewed and interpreted the following labs independently    Chemistry      Component Value Date/Time   NA 138 09/12/2019 0850   NA 141 05/23/2019 0000   K 4.0 09/12/2019 0850   CL 102 09/12/2019 0850   CO2 29 09/12/2019 0850   BUN 26 (H) 09/12/2019 0850   BUN 15 05/23/2019 0000   CREATININE 1.15 09/12/2019 0850   GLU 77 05/23/2019 0000      Component Value Date/Time   CALCIUM 8.7 09/12/2019 0850   ALKPHOS 55  09/12/2019 0850   AST 19 09/12/2019 0850   ALT 3 09/12/2019 0850   BILITOT 0.8 09/12/2019 0850       Lab Results  Component Value Date   WBC 8.9 09/12/2019   HGB 11.8 (L) 09/12/2019   HCT 34.4 (L) 09/12/2019   MCV 100.4 (H) 09/12/2019   PLT 218.0 09/12/2019    Lab Results  Component Value Date   TSH 3.14 09/12/2019     Total time spent on today's visit was 30 minutes, including both face-to-face time and nonface-to-face time.  Time included that spent on review of records (prior notes available to me/labs/imaging if pertinent), discussing treatment and goals, answering patient's questions and coordinating care.  Cc:  [name], MD

## 2019-11-15 NOTE — Telephone Encounter (Signed)
Noted  

## 2019-11-17 ENCOUNTER — Encounter: Payer: Self-pay | Admitting: Cardiovascular Disease

## 2019-11-17 ENCOUNTER — Ambulatory Visit: Payer: Medicare PPO | Admitting: Cardiovascular Disease

## 2019-11-17 ENCOUNTER — Other Ambulatory Visit: Payer: Self-pay

## 2019-11-17 ENCOUNTER — Encounter: Payer: Self-pay | Admitting: Neurology

## 2019-11-17 ENCOUNTER — Ambulatory Visit: Payer: Medicare PPO | Admitting: Neurology

## 2019-11-17 VITALS — BP 115/61 | HR 58 | Ht 73.0 in | Wt 204.0 lb

## 2019-11-17 DIAGNOSIS — I2510 Atherosclerotic heart disease of native coronary artery without angina pectoris: Secondary | ICD-10-CM

## 2019-11-17 DIAGNOSIS — K117 Disturbances of salivary secretion: Secondary | ICD-10-CM | POA: Diagnosis not present

## 2019-11-17 DIAGNOSIS — G20 Parkinson's disease: Secondary | ICD-10-CM

## 2019-11-17 DIAGNOSIS — I10 Essential (primary) hypertension: Secondary | ICD-10-CM | POA: Diagnosis not present

## 2019-11-17 DIAGNOSIS — L03116 Cellulitis of left lower limb: Secondary | ICD-10-CM | POA: Diagnosis not present

## 2019-11-17 DIAGNOSIS — E7800 Pure hypercholesterolemia, unspecified: Secondary | ICD-10-CM

## 2019-11-17 DIAGNOSIS — I5032 Chronic diastolic (congestive) heart failure: Secondary | ICD-10-CM | POA: Diagnosis not present

## 2019-11-17 DIAGNOSIS — I255 Ischemic cardiomyopathy: Secondary | ICD-10-CM | POA: Diagnosis not present

## 2019-11-17 LAB — BASIC METABOLIC PANEL
BUN/Creatinine Ratio: 24 (ref 10–24)
BUN: 30 mg/dL — ABNORMAL HIGH (ref 8–27)
CO2: 25 mmol/L (ref 20–29)
Calcium: 9 mg/dL (ref 8.6–10.2)
Chloride: 102 mmol/L (ref 96–106)
Creatinine, Ser: 1.27 mg/dL (ref 0.76–1.27)
GFR calc Af Amer: 60 mL/min/{1.73_m2} (ref >59)
GFR calc non Af Amer: 52 mL/min/{1.73_m2} — ABNORMAL LOW (ref >59)
Glucose: 102 mg/dL — ABNORMAL HIGH (ref 65–99)
Potassium: 3.9 mmol/L (ref 3.5–5.2)
Sodium: 141 mmol/L (ref 134–144)

## 2019-11-17 LAB — CBC
Hematocrit: 34.8 % — ABNORMAL LOW (ref 37.5–51.0)
Hemoglobin: 12 g/dL — ABNORMAL LOW (ref 13.0–17.7)
MCH: 34.3 pg — ABNORMAL HIGH (ref 26.6–33.0)
MCHC: 34.5 g/dL (ref 31.5–35.7)
MCV: 99 fL — ABNORMAL HIGH (ref 79–97)
Platelets: 222 10*3/uL (ref 150–450)
RBC: 3.5 x10E6/uL — ABNORMAL LOW (ref 4.14–5.80)
RDW: 12.4 % (ref 11.6–15.4)
WBC: 7.9 10*3/uL (ref 3.4–10.8)

## 2019-11-17 MED ORDER — CARBIDOPA-LEVODOPA ER 50-200 MG PO TBCR
1.0000 | EXTENDED_RELEASE_TABLET | Freq: Every day | ORAL | 1 refills | Status: DC
Start: 2019-11-17 — End: 2020-02-16

## 2019-11-17 MED ORDER — FUROSEMIDE 40 MG PO TABS
40.0000 mg | ORAL_TABLET | Freq: Every day | ORAL | 3 refills | Status: DC
Start: 2019-11-17 — End: 2020-08-14

## 2019-11-17 NOTE — Progress Notes (Signed)
Chief Complaint  Patient presents with  . Follow-up    CAD    History of Present Illness: 86 yo male with history of HTN, HLD, CAD s/p 5V CABG March 2010, ischemic cardiomyopathy, GERD, BPH here today for follow up. He was admitted March 2010 with an inferior MI complicated by VF arrest multiple times and unsuccessful attempt at PCI in the setting of severe multi-vessel CAD. He then had an urgent 5 V CABG. He is known to have a cardiomyopathy with last echo 2013 showing LVEF 35-40%. Stress myoview 03/08/13 with scar but no ischemia, LVEF=46%.He has refused to repeat the echo over the past few years. He was admitted to [facility] 2020 with left leg cellulitis and sepsis with generalized weakness. Recent diagnosis of Parkinson's disease. He was seen in our office 11/09/19 with c/o worsened LE edema and recurrent cellulitis. He was taking Lasix 20 mg every other day. Keflex restarted and Lasix increased to 40 mg daily.   He is here today for follow up. The patient denies any chest pain, dyspnea, palpitations, orthopnea, PND, dizziness, near syncope or syncope. He feels much better. His LE edema is much improved but still with some swelling. He completed the extra week of Keflex. His legs are still red and he wonders if he still has cellulitis.   Primary Care Physician: [name], MD  Past Medical History:  Diagnosis Date  . Acute myocardial infarction of inferoposterior wall, subsequent episode of care ([place])   . CAD (coronary artery disease)   . Encounter for long-term (current) use of other medications   . Family history of malignant neoplasm of gastrointestinal tract   . GERD (gastroesophageal reflux disease)   . Hypercholesterolemia   . Hypertension   . Hypertrophy of prostate with urinary obstruction and other lower urinary tract symptoms (LUTS)   . Personal history of thrombophlebitis   . PVD (peripheral vascular disease) ([place])   . Respiratory failure ([place])   . Unspecified adverse  effect of unspecified drug, medicinal and biological substance   . Unspecified hypothyroidism     Past Surgical History:  Procedure Laterality Date  . BIOPSY  04/28/2019   Procedure: BIOPSY;  Surgeon: Thornton Park, MD;  Location: [facility];  Service: Gastroenterology;;  . CATARACT EXTRACTION, BILATERAL    . CHOLECYSTECTOMY  02/26/2012   Procedure: LAPAROSCOPIC CHOLECYSTECTOMY WITH INTRAOPERATIVE CHOLANGIOGRAM;  Surgeon: Madilyn Hook, DO;  Location: [facility];  Service: General;  Laterality: N/A;  . CORONARY ARTERY BYPASS GRAFT     x 5 on August 29, 2008  . ESOPHAGOGASTRODUODENOSCOPY (EGD) WITH PROPOFOL N/A 04/28/2019   Procedure: ESOPHAGOGASTRODUODENOSCOPY (EGD) WITH PROPOFOL;  Surgeon: Thornton Park, MD;  Location: [facility];  Service: Gastroenterology;  Laterality: N/A;  . REPLACEMENT TOTAL KNEE Left   . tachecostomy placement     after heart attack  . TONSILLECTOMY      Current Outpatient Medications  Medication Sig Dispense Refill  . Acetaminophen (TYLENOL EXTRA STRENGTH PO) Take 1-2 tablets by mouth daily as needed (for back pain).    . B Complex-C-Folic Acid (FOLBEE PLUS) TABS Take 1 tablet by mouth daily. 90 tablet 1  . carbidopa-levodopa (SINEMET) 25-100 MG tablet Take 1 tablet by mouth 4 (four) times daily. 1 at 7am/11am/3pm/7pm 360 tablet 1  . cephALEXin (KEFLEX) 500 MG capsule Take 1 capsule (500 mg total) by mouth 2 (two) times daily. 14 capsule 0  . ferrous sulfate 325 (65 FE) MG EC tablet TAKE 1 TABLET BY MOUTH TWICE A  DAY (Patient taking differently: Take 325 mg by mouth every other day. ) 180 tablet 1  . furosemide (LASIX) 40 MG tablet Take 1 tablet (40 mg total) by mouth daily. 90 tablet 3  . hydrOXYzine (ATARAX/VISTARIL) 10 MG tablet TAKE 1 TABLET BY MOUTH AT BEDTIME AS NEEDED 90 tablet 1  . levothyroxine (SYNTHROID) 50 MCG tablet TAKE 1 TABLET BY MOUTH DAILY BEFORE BREAKFAST 90 tablet 1  . lisinopril (ZESTRIL) 2.5 MG tablet TAKE 1 TABLET BY MOUTH EVERY DAY 90  tablet 3  . nitroGLYCERIN (NITROSTAT) 0.4 MG SL tablet Place 1 tablet (0.4 mg total) under the tongue every 5 (five) minutes as needed for chest pain. 20 tablet 1  . pantoprazole (PROTONIX) 40 MG tablet TAKE 1 TABLET BY MOUTH TWICE A DAY 180 tablet 1  . simvastatin (ZOCOR) 20 MG tablet TAKE 1 TABLET BY MOUTH EVERYDAY AT BEDTIME 90 tablet 3  . tamsulosin (FLOMAX) 0.4 MG CAPS capsule TAKE 1 CAPSULE BY MOUTH EVERY DAY 90 capsule 3  . vitamin B-12 (CYANOCOBALAMIN) 500 MCG tablet Take 500 mcg by mouth daily.     No current facility-administered medications for this visit.    Allergies  Allergen Reactions  . Losartan Potassium Other (See Comments)    Not known  . Naproxen Nausea And Vomiting  . Penicillins Rash    Did it involve swelling of the face/tongue/throat, SOB, or low BP? No Did it involve sudden or severe rash/hives, skin peeling, or any reaction on the inside of your mouth or nose? Yes Did you need to seek medical attention at a hospital or doctor's office? Unknown When did it last happen? If all above answers are "NO", may proceed with cephalosporin use.  TOLERATED CEFTRIAXONE 10/07/2018    Social History   Socioeconomic History  . Marital status: Married    Spouse name: Apolonio Schneiders  . Number of children: 2  . Years of education: Not on file  . Highest education level: Master's degree (e.g., MA, MS, MEng, MEd, MSW, MBA)  Occupational History    Comment: Retired Careers adviser   Tobacco Use  . Smoking status: Never Smoker  . Smokeless tobacco: Never Used  Substance and Sexual Activity  . Alcohol use: No  . Drug use: No  . Sexual activity: Not on file  Other Topics Concern  . Not on file  Social History Narrative   Married 1958. 2 kids (boy, girl). 2 grandkids (boy and girl).       Retired from [employer] in [place])      Felt: former Air cabin crew, tv, reading      Right handed      Lives in a single story home with wife and Daughter Linus Orn.    Social Determinants of Health   Financial Resource Strain:   . Difficulty of Paying Living Expenses:   Food Insecurity:   . Worried About Charity fundraiser in the Last Year:   . Arboriculturist in the Last Year:   Transportation Needs:   . Film/video editor (Medical):   Marland Kitchen Lack of Transportation (Non-Medical):   Physical Activity:   . Days of Exercise per Week:   . Minutes of Exercise per Session:   Stress:   . Feeling of Stress :   Social Connections:   . Frequency of Communication with Friends and Family:   . Frequency of Social Gatherings with Friends and Family:   . Attends Religious Services:   . Active Member of Clubs  or Organizations:   . Attends Archivist Meetings:   Marland Kitchen Marital Status:   Intimate Partner Violence:   . Fear of Current or Ex-Partner:   . Emotionally Abused:   Marland Kitchen Physically Abused:   . Sexually Abused:     Family History  Problem Relation Age of Onset  . Heart disease Mother   . Tuberculosis Father   . Colon cancer Brother     Review of Systems:  As stated in the HPI and otherwise negative.   BP 128/66   Pulse (!) 59   Ht 6\' 1"  (1.854 m)   Wt 202 lb (91.6 kg)   SpO2 95%   BMI 26.65 kg/m   Physical Examination:  General: Well developed, well nourished, NAD  HEENT: OP clear, mucus membranes moist  SKIN: warm, dry. No rashes. Neuro: No focal deficits  Musculoskeletal: Muscle strength 5/5 all ext  Psychiatric: Mood and affect normal  Neck: No JVD, no carotid bruits, no thyromegaly, no lymphadenopathy.  Lungs:Clear bilaterally, no wheezes, rhonci, crackles Cardiovascular: Regular rate and rhythm. No murmurs, gallops or rubs. Abdomen:Soft. Bowel sounds present. Non-tender.  Extremities: 1+ bilateral lower extremity edema.   Echo 09/08/11:  Left ventricle: The cavity size was normal. Wall thickness was normal. Systolic function was moderately reduced. The estimated ejection fraction was in the range of 35% to 40%. There  is akinesis of the inferoposterior myocardium. Doppler parameters are consistent with abnormal left ventricular relaxation (grade 1 diastolic dysfunction). - Mitral valve: Mild regurgitation. - Left atrium: The atrium was mildly dilated.  EKG:  EKG is not ordered today. The ekg ordered today demonstrates   Recent Labs: 04/30/2019: Magnesium 1.8 09/12/2019: ALT 3; BUN 26; Creatinine, Ser 1.15; Hemoglobin 11.8; Platelets 218.0; Potassium 4.0; Sodium 138; TSH 3.14   Lipid Panel    Component Value Date/Time   CHOL 96 09/12/2019 0850   TRIG 51.0 09/12/2019 0850   HDL 36.20 (L) 09/12/2019 0850   CHOLHDL 3 09/12/2019 0850   VLDL 10.2 09/12/2019 0850   LDLCALC 49 09/12/2019 0850   LDLDIRECT 60.0 04/08/2016 0925     Wt Readings from Last 3 Encounters:  11/17/19 202 lb (91.6 kg)  11/09/19 217 lb (98.4 kg)  10/28/19 190 lb (86.2 kg)     Other studies Reviewed: Additional studies/ records that were reviewed today include: . Review of the above records demonstrates:    Assessment and Plan:   1. CAD s/p CABG without angina: No chest pain. Continue ASA, statin and beta blocker.    2. Ischemic Cardiomyopathy:  Last LVEF 46% by stress myoview 2014. No findings of CHF. He does not want to repeat an echo. Continue Ace-inh and beta blocker  3. HTN: BP is well controlled. Continue current therapy  4. Hyperlipidemia: Lipids well controlled. LDL 49 in March 2021. Continue statin.   5. LE cellulitis: Will ask him to follow up with Dr. Yong Channel to discuss. Check CBC today  6. Chronic diastolic CHF: Continue Lasix 40 mg daily and use extra 40 mg of lasix on days that he has more swelling. Check BMET today  Current medicines are reviewed at length with the patient today.  The patient does not have concerns regarding medicines.  The following changes have been made:  no change  Labs/ tests ordered today include:   Orders Placed This Encounter  Procedures  . CBC  . Basic metabolic panel     Disposition:   FU with me in 3 months    Signed,  Lauree Chandler, MD 11/17/2019 11:34 AM    [facility] [address], [place], [place]  [address] Phone: [phone]; Fax: [phone]

## 2019-11-17 NOTE — Patient Instructions (Signed)
Medication Instructions:  No changes today - continue with the new dose of Lasix --40 mg once a day  *If you need a refill on your cardiac medications before your next appointment, please call your pharmacy*   Lab Work: Today: CBC, BMET If you have labs (blood work) drawn today and your tests are completely normal, you will receive your results only by: Marland Kitchen MyChart Message (if you have MyChart) OR . A paper copy in the mail If you have any lab test that is abnormal or we need to change your treatment, we will call you to review the results.   Testing/Procedures: none   Follow-Up: At [facility], you and your health needs are our priority.  As part of our continuing mission to provide you with exceptional heart care, we have created designated Provider Care Teams.  These Care Teams include your primary Cardiologist (physician) and Advanced Practice Providers (APPs -  Physician Assistants and Nurse Practitioners) who all work together to provide you with the care you need, when you need it.  Your next appointment:   3 month(s)  The format for your next appointment:   In Person  Provider:   You may see Lauree Chandler, MD or one of the following Advanced Practice Providers on your designated Care Team:    Melina Copa, PA-C  Ermalinda Barrios, PA-C    Other Instructions

## 2019-11-17 NOTE — Patient Instructions (Signed)
1.  We will refer you to [facility].  They will call you for home PT 2.  We will start carbidopa/levodopa 50/200 CR at bedtime (11pm) 3.  You will continue carbidopa/levodopa 25/100, 1 tablet at 7am/11am/3pm/7pm  The physicians and staff at [facility] are committed to providing excellent care. You may receive a survey requesting feedback about your experience at our office. We strive to receive "very good" responses to the survey questions. If you feel that your experience would prevent you from giving the office a "very good " response, please contact our office to try to remedy the situation. We may be reached at [phone]. Thank you for taking the time out of your busy day to complete the survey.

## 2019-11-18 NOTE — Addendum Note (Signed)
Addended by: Ulice Brilliant T on: 11/18/2019 04:10 PM   Modules accepted: Orders

## 2019-11-22 NOTE — Addendum Note (Signed)
Addended by: Mendel Ryder on: 11/22/2019 08:00 AM   Modules accepted: Orders

## 2019-11-28 DIAGNOSIS — I5032 Chronic diastolic (congestive) heart failure: Secondary | ICD-10-CM | POA: Diagnosis not present

## 2019-11-28 DIAGNOSIS — F419 Anxiety disorder, unspecified: Secondary | ICD-10-CM | POA: Diagnosis not present

## 2019-11-28 DIAGNOSIS — I2510 Atherosclerotic heart disease of native coronary artery without angina pectoris: Secondary | ICD-10-CM | POA: Diagnosis not present

## 2019-11-28 DIAGNOSIS — I130 Hypertensive heart and chronic kidney disease with heart failure and stage 1 through stage 4 chronic kidney disease, or unspecified chronic kidney disease: Secondary | ICD-10-CM | POA: Diagnosis not present

## 2019-11-28 DIAGNOSIS — M545 Low back pain: Secondary | ICD-10-CM | POA: Diagnosis not present

## 2019-11-28 DIAGNOSIS — D631 Anemia in chronic kidney disease: Secondary | ICD-10-CM | POA: Diagnosis not present

## 2019-11-28 DIAGNOSIS — I255 Ischemic cardiomyopathy: Secondary | ICD-10-CM | POA: Diagnosis not present

## 2019-11-28 DIAGNOSIS — G20 Parkinson's disease: Secondary | ICD-10-CM | POA: Diagnosis not present

## 2019-11-28 DIAGNOSIS — N183 Chronic kidney disease, stage 3 unspecified: Secondary | ICD-10-CM | POA: Diagnosis not present

## 2019-12-02 DIAGNOSIS — G20 Parkinson's disease: Secondary | ICD-10-CM | POA: Diagnosis not present

## 2019-12-02 DIAGNOSIS — M545 Low back pain: Secondary | ICD-10-CM | POA: Diagnosis not present

## 2019-12-02 DIAGNOSIS — N183 Chronic kidney disease, stage 3 unspecified: Secondary | ICD-10-CM | POA: Diagnosis not present

## 2019-12-02 DIAGNOSIS — D631 Anemia in chronic kidney disease: Secondary | ICD-10-CM | POA: Diagnosis not present

## 2019-12-02 DIAGNOSIS — F419 Anxiety disorder, unspecified: Secondary | ICD-10-CM | POA: Diagnosis not present

## 2019-12-02 DIAGNOSIS — I255 Ischemic cardiomyopathy: Secondary | ICD-10-CM | POA: Diagnosis not present

## 2019-12-02 DIAGNOSIS — I130 Hypertensive heart and chronic kidney disease with heart failure and stage 1 through stage 4 chronic kidney disease, or unspecified chronic kidney disease: Secondary | ICD-10-CM | POA: Diagnosis not present

## 2019-12-02 DIAGNOSIS — I2510 Atherosclerotic heart disease of native coronary artery without angina pectoris: Secondary | ICD-10-CM | POA: Diagnosis not present

## 2019-12-02 DIAGNOSIS — I5032 Chronic diastolic (congestive) heart failure: Secondary | ICD-10-CM | POA: Diagnosis not present

## 2019-12-06 DIAGNOSIS — N183 Chronic kidney disease, stage 3 unspecified: Secondary | ICD-10-CM | POA: Diagnosis not present

## 2019-12-06 DIAGNOSIS — F419 Anxiety disorder, unspecified: Secondary | ICD-10-CM | POA: Diagnosis not present

## 2019-12-06 DIAGNOSIS — G20 Parkinson's disease: Secondary | ICD-10-CM | POA: Diagnosis not present

## 2019-12-06 DIAGNOSIS — I2510 Atherosclerotic heart disease of native coronary artery without angina pectoris: Secondary | ICD-10-CM | POA: Diagnosis not present

## 2019-12-06 DIAGNOSIS — M545 Low back pain: Secondary | ICD-10-CM | POA: Diagnosis not present

## 2019-12-06 DIAGNOSIS — I255 Ischemic cardiomyopathy: Secondary | ICD-10-CM | POA: Diagnosis not present

## 2019-12-06 DIAGNOSIS — I5032 Chronic diastolic (congestive) heart failure: Secondary | ICD-10-CM | POA: Diagnosis not present

## 2019-12-06 DIAGNOSIS — I130 Hypertensive heart and chronic kidney disease with heart failure and stage 1 through stage 4 chronic kidney disease, or unspecified chronic kidney disease: Secondary | ICD-10-CM | POA: Diagnosis not present

## 2019-12-06 DIAGNOSIS — D631 Anemia in chronic kidney disease: Secondary | ICD-10-CM | POA: Diagnosis not present

## 2019-12-08 ENCOUNTER — Telehealth: Payer: Self-pay | Admitting: Family Medicine

## 2019-12-08 DIAGNOSIS — M545 Low back pain: Secondary | ICD-10-CM | POA: Diagnosis not present

## 2019-12-08 DIAGNOSIS — G20 Parkinson's disease: Secondary | ICD-10-CM | POA: Diagnosis not present

## 2019-12-08 DIAGNOSIS — D631 Anemia in chronic kidney disease: Secondary | ICD-10-CM | POA: Diagnosis not present

## 2019-12-08 DIAGNOSIS — N183 Chronic kidney disease, stage 3 unspecified: Secondary | ICD-10-CM | POA: Diagnosis not present

## 2019-12-08 DIAGNOSIS — I130 Hypertensive heart and chronic kidney disease with heart failure and stage 1 through stage 4 chronic kidney disease, or unspecified chronic kidney disease: Secondary | ICD-10-CM | POA: Diagnosis not present

## 2019-12-08 DIAGNOSIS — F419 Anxiety disorder, unspecified: Secondary | ICD-10-CM | POA: Diagnosis not present

## 2019-12-08 DIAGNOSIS — I5032 Chronic diastolic (congestive) heart failure: Secondary | ICD-10-CM | POA: Diagnosis not present

## 2019-12-08 DIAGNOSIS — I255 Ischemic cardiomyopathy: Secondary | ICD-10-CM | POA: Diagnosis not present

## 2019-12-08 DIAGNOSIS — I2510 Atherosclerotic heart disease of native coronary artery without angina pectoris: Secondary | ICD-10-CM | POA: Diagnosis not present

## 2019-12-08 NOTE — Telephone Encounter (Signed)
Unless we specifically talked about the total last visit-yes I do unfortunately believe we need a visit-I am open to virtual unless they would like to try to bring him in

## 2019-12-08 NOTE — Telephone Encounter (Signed)
Zachary Decker is calling from [facility] asking for verbal orders for home health physical therapy and skills nursing evaluation R toe wound

## 2019-12-08 NOTE — Telephone Encounter (Signed)
Last seen patient on 5/7 for cellulitis in leg? Do you need to see for evaluation on toe?

## 2019-12-09 NOTE — Telephone Encounter (Signed)
Sorry looks like I marked as sensitive on accident "Unless we specifically talked about the total last visit-yes I do unfortunately believe we need a visit-I am open to virtual unless they would like to try to bring him in"

## 2019-12-09 NOTE — Telephone Encounter (Signed)
Please call for app  

## 2019-12-09 NOTE — Telephone Encounter (Signed)
Do you know what the last message is?

## 2019-12-09 NOTE — Telephone Encounter (Signed)
Scheduled pt appt on 6/29 at 920.

## 2019-12-09 NOTE — Telephone Encounter (Signed)
Fyi.

## 2019-12-09 NOTE — Telephone Encounter (Signed)
Pt states he is currently doing PT that was approved by Dr. Carles Collet.

## 2019-12-12 DIAGNOSIS — F419 Anxiety disorder, unspecified: Secondary | ICD-10-CM | POA: Diagnosis not present

## 2019-12-12 DIAGNOSIS — D631 Anemia in chronic kidney disease: Secondary | ICD-10-CM | POA: Diagnosis not present

## 2019-12-12 DIAGNOSIS — N183 Chronic kidney disease, stage 3 unspecified: Secondary | ICD-10-CM | POA: Diagnosis not present

## 2019-12-12 DIAGNOSIS — I130 Hypertensive heart and chronic kidney disease with heart failure and stage 1 through stage 4 chronic kidney disease, or unspecified chronic kidney disease: Secondary | ICD-10-CM | POA: Diagnosis not present

## 2019-12-12 DIAGNOSIS — I2510 Atherosclerotic heart disease of native coronary artery without angina pectoris: Secondary | ICD-10-CM | POA: Diagnosis not present

## 2019-12-12 DIAGNOSIS — I5032 Chronic diastolic (congestive) heart failure: Secondary | ICD-10-CM | POA: Diagnosis not present

## 2019-12-12 DIAGNOSIS — G20 Parkinson's disease: Secondary | ICD-10-CM | POA: Diagnosis not present

## 2019-12-12 DIAGNOSIS — M545 Low back pain: Secondary | ICD-10-CM | POA: Diagnosis not present

## 2019-12-12 DIAGNOSIS — I255 Ischemic cardiomyopathy: Secondary | ICD-10-CM | POA: Diagnosis not present

## 2019-12-15 DIAGNOSIS — D631 Anemia in chronic kidney disease: Secondary | ICD-10-CM | POA: Diagnosis not present

## 2019-12-15 DIAGNOSIS — N183 Chronic kidney disease, stage 3 unspecified: Secondary | ICD-10-CM | POA: Diagnosis not present

## 2019-12-15 DIAGNOSIS — M545 Low back pain: Secondary | ICD-10-CM | POA: Diagnosis not present

## 2019-12-15 DIAGNOSIS — I5032 Chronic diastolic (congestive) heart failure: Secondary | ICD-10-CM | POA: Diagnosis not present

## 2019-12-15 DIAGNOSIS — I2510 Atherosclerotic heart disease of native coronary artery without angina pectoris: Secondary | ICD-10-CM | POA: Diagnosis not present

## 2019-12-15 DIAGNOSIS — F419 Anxiety disorder, unspecified: Secondary | ICD-10-CM | POA: Diagnosis not present

## 2019-12-15 DIAGNOSIS — I130 Hypertensive heart and chronic kidney disease with heart failure and stage 1 through stage 4 chronic kidney disease, or unspecified chronic kidney disease: Secondary | ICD-10-CM | POA: Diagnosis not present

## 2019-12-15 DIAGNOSIS — G20 Parkinson's disease: Secondary | ICD-10-CM | POA: Diagnosis not present

## 2019-12-15 DIAGNOSIS — I255 Ischemic cardiomyopathy: Secondary | ICD-10-CM | POA: Diagnosis not present

## 2019-12-19 DIAGNOSIS — F419 Anxiety disorder, unspecified: Secondary | ICD-10-CM | POA: Diagnosis not present

## 2019-12-19 DIAGNOSIS — G20 Parkinson's disease: Secondary | ICD-10-CM | POA: Diagnosis not present

## 2019-12-19 DIAGNOSIS — I2510 Atherosclerotic heart disease of native coronary artery without angina pectoris: Secondary | ICD-10-CM | POA: Diagnosis not present

## 2019-12-19 DIAGNOSIS — I255 Ischemic cardiomyopathy: Secondary | ICD-10-CM | POA: Diagnosis not present

## 2019-12-19 DIAGNOSIS — M545 Low back pain: Secondary | ICD-10-CM | POA: Diagnosis not present

## 2019-12-19 DIAGNOSIS — N183 Chronic kidney disease, stage 3 unspecified: Secondary | ICD-10-CM | POA: Diagnosis not present

## 2019-12-19 DIAGNOSIS — I130 Hypertensive heart and chronic kidney disease with heart failure and stage 1 through stage 4 chronic kidney disease, or unspecified chronic kidney disease: Secondary | ICD-10-CM | POA: Diagnosis not present

## 2019-12-19 DIAGNOSIS — I5032 Chronic diastolic (congestive) heart failure: Secondary | ICD-10-CM | POA: Diagnosis not present

## 2019-12-19 DIAGNOSIS — D631 Anemia in chronic kidney disease: Secondary | ICD-10-CM | POA: Diagnosis not present

## 2019-12-20 ENCOUNTER — Encounter: Payer: Self-pay | Admitting: Family Medicine

## 2019-12-20 ENCOUNTER — Telehealth (INDEPENDENT_AMBULATORY_CARE_PROVIDER_SITE_OTHER): Payer: Medicare PPO | Admitting: Family Medicine

## 2019-12-20 VITALS — BP 115/70 | Ht 73.0 in | Wt 204.0 lb

## 2019-12-20 DIAGNOSIS — N183 Chronic kidney disease, stage 3 unspecified: Secondary | ICD-10-CM | POA: Diagnosis not present

## 2019-12-20 DIAGNOSIS — I255 Ischemic cardiomyopathy: Secondary | ICD-10-CM | POA: Diagnosis not present

## 2019-12-20 DIAGNOSIS — B351 Tinea unguium: Secondary | ICD-10-CM

## 2019-12-20 DIAGNOSIS — R234 Changes in skin texture: Secondary | ICD-10-CM | POA: Diagnosis not present

## 2019-12-20 MED ORDER — TERBINAFINE HCL 250 MG PO TABS: 250.0000 mg | ORAL_TABLET | Freq: Every day | ORAL | 0 refills | Status: DC

## 2019-12-20 NOTE — Progress Notes (Signed)
Phone [phone] Virtual visit via Video note   Subjective:  Chief complaint: erythematous toes Chief Complaint  Patient presents with   virtual    discuss orders   This visit type was conducted due to national recommendations for restrictions regarding the COVID-19 Pandemic (e.g. social distancing).  This format is felt to be most appropriate for this patient at this time balancing risks to patient and risks to population by having him in for in person visit.  No physical exam was performed (except for noted visual exam or audio findings with Telehealth visits).    Our team/I connected with [name] at 11:00 AM EDT by a video enabled telemedicine application (doxy.me or caregility through epic) and verified that I am speaking with the correct person using two identifiers.  We did experience some technical difficulties but were able to intermittently connect but was not able to fully visualize feet. Location patient: [place] Location provider: [facility], office Persons participating in the virtual visit:  patient, daughter  Our team/I discussed the limitations of evaluation and management by telemedicine and the availability of in person appointments. In light of current covid-19 pandemic, patient also understands that we are trying to protect them by minimizing in office contact if at all possible.  The patient expressed consent for telemedicine visit and agreed to proceed. Patient understands insurance will be billed.   Past Medical History-  Patient Active Problem List   Diagnosis Date Noted   Duodenal ulcer     Priority: High   MSSA bacteremia 04/25/2019    Priority: High   Cellulitis of right leg 04/20/2019    Priority: High   Parkinson's disease ([place]) 04/13/2018    Priority: High   CAD w/ hx MI s/p CABG 09/07/2012    Priority: High   Cardiomyopathy, ischemic 03/22/2010    Priority: High   Atherosclerosis of aorta ([place]) 08/16/2019    Priority: Medium    Low back pain 04/19/2015    Priority: Medium   CKD (chronic kidney disease), stage III 04/20/2014    Priority: Medium   Anxiety state 04/17/2014    Priority: Medium   HYPERCHOLESTEROLEMIA 10/31/2008    Priority: Medium   BPH (benign prostatic hyperplasia) 09/28/2007    Priority: Medium   Hypothyroidism 03/26/2007    Priority: Medium   Essential hypertension 03/22/2007    Priority: Medium   Senile purpura ([place]) 04/13/2018    Priority: Low   ANEMIA, VITAMIN B12 DEFICIENCY 04/22/2010    Priority: Low   Actinic keratosis 06/25/2009    Priority: Low   BASAL CELL CARCINOMA, NOSE 02/05/2009    Priority: Low   Localized osteoarthrosis, lower leg 12/06/2008    Priority: Low   GERD 03/22/2007    Priority: Low   Onychomycosis 12/20/2019   Dehydration 04/20/2019   Generalized weakness 04/20/2019   Cellulitis of left foot    Cellulitis of left leg 10/07/2018   Insomnia 04/13/2018    Medications- reviewed and updated Current Outpatient Medications  Medication Sig Dispense Refill   Acetaminophen (TYLENOL EXTRA STRENGTH PO) Take 1-2 tablets by mouth daily as needed (for back pain).     B Complex-C-Folic Acid (FOLBEE PLUS) TABS Take 1 tablet by mouth daily. 90 tablet 1   carbidopa-levodopa (SINEMET CR) 50-200 MG tablet Take 1 tablet by mouth at bedtime. 90 tablet 1   carbidopa-levodopa (SINEMET) 25-100 MG tablet Take 1 tablet by mouth 4 (four) times daily. 1 at 7am/11am/3pm/7pm 360 tablet 1   furosemide (LASIX) 40 MG  tablet Take 1 tablet (40 mg total) by mouth daily. 90 tablet 3   hydrOXYzine (ATARAX/VISTARIL) 10 MG tablet TAKE 1 TABLET BY MOUTH AT BEDTIME AS NEEDED 90 tablet 1   levothyroxine (SYNTHROID) 50 MCG tablet TAKE 1 TABLET BY MOUTH DAILY BEFORE BREAKFAST 90 tablet 1   lisinopril (ZESTRIL) 2.5 MG tablet TAKE 1 TABLET BY MOUTH EVERY DAY 90 tablet 3   nitroGLYCERIN (NITROSTAT) 0.4 MG SL tablet Place 1 tablet (0.4 mg total) under the tongue every 5 (five)  minutes as needed for chest pain. 20 tablet 1   pantoprazole (PROTONIX) 40 MG tablet TAKE 1 TABLET BY MOUTH TWICE A DAY 180 tablet 1   simvastatin (ZOCOR) 20 MG tablet TAKE 1 TABLET BY MOUTH EVERYDAY AT BEDTIME 90 tablet 3   tamsulosin (FLOMAX) 0.4 MG CAPS capsule TAKE 1 CAPSULE BY MOUTH EVERY DAY 90 capsule 3   vitamin B-12 (CYANOCOBALAMIN) 500 MCG tablet Take 500 mcg by mouth daily.     terbinafine (LAMISIL) 250 MG tablet Take 1 tablet (250 mg total) by mouth daily. 90 tablet 0   No current facility-administered medications for this visit.     Objective:  BP 115/70    Ht 6\' 1"  (1.854 m)    Wt 204 lb (92.5 kg)    BMI 26.91 kg/m  self reported vitals Gen: NAD, resting comfortably Could not get a good view of feet with Internet connectivity issues.  See my chart pictures which were sent after visit     Assessment and Plan   #Ischemic cardiomyopathy  S: Medication: Lasix 40 mg daily increased from 20 mg every other day.  Lisinopril 2.5 mg.  Plan is to use additional Lasix on days so twice daily dosing if additional swelling Patient reports seeing cardiology in late May-had significant diuresis.  Edema: Much improved Weight gain: Down 13 pounds from peak weight  A/P: Appears to be much improved.  Continue Lasix 40 mg daily.  We will update CBC and CMP with labs.  #Chronic kidney disease stage III-GFR stable in the 50s on last check.  Last time a month ago and he has been on higher dose Lasix so we opted to repeat this when he comes back for labs tomorrow.  Continue low-dose lisinopril  #Scaly feet/mild erythema of toes/onychomycosis S: pt noted to have crusting on his feet in particular in between his toes with some soresabout a month ago. Slightly improving.  Physical therapy evaluated patient and thought he should be evaluated by physician.  No fever chills or expanding redness.Has tried peroxide. One of areas between the toes was bleeding.   Patient's lower legs appear to be  better-no obvious recurrent cellulitis. Denies any problem standing but is having problems getting comfortable in bed at night.  Other  not thought to be related symptoms-neck and shoulders bothering him some in the neck.  A/P: During visit we were having some trouble with video and I was unable to see his toes-they sent pictures after the visit.  I am going to have my team communicate the following message 1.  Patient appears to have onychomycosis (yellow thickened toenails) on his toes which is a fungal infection that can increase risk of cellulitis-I am going to treat him with a 12-week course of Lamisil which I sent in.  Team after he has completed the CMP order another CMP and he needs to come by for labs in 6 weeks for recheck-could also schedule evaluation with me at that time and I could  order the lab if he is willing. -There are some risks of this such as liver failure changes of the eye.  If any evidence of liver changes with medication or blurry vision we will stop medication and reassess 2.  I had planned on wound care referral but after looking at the toes and the scaling on the toes.  I would recommend bathing the feet daily and gently exfoliating areas of dry skin.  After bathing the feet would recommend a thicker volume like Vaseline.  If there are any open areas such as between the toes can use antibiotic ointment like Neosporin if no allergy 3.  If worsening redness I want him to let me know immediately and I will send in an antibiotic based off of this visit.  I held off based off the fact that they reported symptoms were improving 4.  If not making progress in 1 to 2 weeks we can reconsider wound care consult  Recommended follow up: 6-week LFT check-could do this with in person visit Future Appointments  Date Time Provider [place]  12/21/2019 11:15 AM [facility]  02/15/2020 10:40 AM Burnell Blanks, MD [facility]  05/24/2020  1:00 PM  Tat, Eustace Quail, DO [facility] None    Lab/Order associations:   ICD-10-CM   1. Stage 3 chronic kidney disease, unspecified whether stage 3a or 3b CKD  N18.30 CBC with Differential/Platelet    Comprehensive metabolic panel  2. Cardiomyopathy, ischemic  I25.5   3. Scaling of skin  R23.4   4. Onychomycosis  B35.1     Meds ordered this encounter  Medications   terbinafine (LAMISIL) 250 MG tablet    Sig: Take 1 tablet (250 mg total) by mouth daily.    Dispense:  90 tablet    Refill:  0   Return precautions advised.  Garret Reddish, MD

## 2019-12-21 ENCOUNTER — Other Ambulatory Visit: Payer: Self-pay

## 2019-12-21 ENCOUNTER — Encounter: Payer: Self-pay | Admitting: Family Medicine

## 2019-12-21 ENCOUNTER — Other Ambulatory Visit (INDEPENDENT_AMBULATORY_CARE_PROVIDER_SITE_OTHER): Payer: Medicare PPO

## 2019-12-21 DIAGNOSIS — N183 Chronic kidney disease, stage 3 unspecified: Secondary | ICD-10-CM

## 2019-12-21 LAB — COMPREHENSIVE METABOLIC PANEL
ALT: 8 U/L (ref 0–53)
AST: 25 U/L (ref 0–37)
Albumin: 3.7 g/dL (ref 3.5–5.2)
Alkaline Phosphatase: 65 U/L (ref 39–117)
BUN: 44 mg/dL — ABNORMAL HIGH (ref 6–23)
CO2: 28 mEq/L (ref 19–32)
Calcium: 8.9 mg/dL (ref 8.4–10.5)
Chloride: 101 mEq/L (ref 96–112)
Creatinine, Ser: 1.47 mg/dL (ref 0.40–1.50)
GFR: 45.58 mL/min — ABNORMAL LOW (ref >60.00)
Glucose, Bld: 107 mg/dL — ABNORMAL HIGH (ref 70–99)
Potassium: 4 mEq/L (ref 3.5–5.1)
Sodium: 136 mEq/L (ref 135–145)
Total Bilirubin: 0.6 mg/dL (ref 0.2–1.2)
Total Protein: 6.7 g/dL (ref 6.0–8.3)

## 2019-12-21 LAB — CBC WITH DIFFERENTIAL/PLATELET
Basophils Absolute: 0.1 10*3/uL (ref 0.0–0.1)
Basophils Relative: 0.7 % (ref 0.0–3.0)
Eosinophils Absolute: 0.5 10*3/uL (ref 0.0–0.7)
Eosinophils Relative: 5.2 % — ABNORMAL HIGH (ref 0.0–5.0)
HCT: 34.6 % — ABNORMAL LOW (ref 39.0–52.0)
Hemoglobin: 11.8 g/dL — ABNORMAL LOW (ref 13.0–17.0)
Lymphocytes Relative: 24.7 % (ref 12.0–46.0)
Lymphs Abs: 2.3 10*3/uL (ref 0.7–4.0)
MCHC: 33.9 g/dL (ref 30.0–36.0)
MCV: 102.3 fl — ABNORMAL HIGH (ref 78.0–100.0)
Monocytes Absolute: 1.1 10*3/uL — ABNORMAL HIGH (ref 0.1–1.0)
Monocytes Relative: 12 % (ref 3.0–12.0)
Neutro Abs: 5.3 10*3/uL (ref 1.4–7.7)
Neutrophils Relative %: 57.4 % (ref 43.0–77.0)
Platelets: 224 10*3/uL (ref 150.0–400.0)
RBC: 3.39 Mil/uL — ABNORMAL LOW (ref 4.22–5.81)
RDW: 13 % (ref 11.5–15.5)
WBC: 9.3 10*3/uL (ref 4.0–10.5)

## 2019-12-22 DIAGNOSIS — D631 Anemia in chronic kidney disease: Secondary | ICD-10-CM | POA: Diagnosis not present

## 2019-12-22 DIAGNOSIS — F419 Anxiety disorder, unspecified: Secondary | ICD-10-CM | POA: Diagnosis not present

## 2019-12-22 DIAGNOSIS — I5032 Chronic diastolic (congestive) heart failure: Secondary | ICD-10-CM | POA: Diagnosis not present

## 2019-12-22 DIAGNOSIS — I2510 Atherosclerotic heart disease of native coronary artery without angina pectoris: Secondary | ICD-10-CM | POA: Diagnosis not present

## 2019-12-22 DIAGNOSIS — G20 Parkinson's disease: Secondary | ICD-10-CM | POA: Diagnosis not present

## 2019-12-22 DIAGNOSIS — M545 Low back pain: Secondary | ICD-10-CM | POA: Diagnosis not present

## 2019-12-22 DIAGNOSIS — I255 Ischemic cardiomyopathy: Secondary | ICD-10-CM | POA: Diagnosis not present

## 2019-12-22 DIAGNOSIS — N183 Chronic kidney disease, stage 3 unspecified: Secondary | ICD-10-CM | POA: Diagnosis not present

## 2019-12-22 DIAGNOSIS — I130 Hypertensive heart and chronic kidney disease with heart failure and stage 1 through stage 4 chronic kidney disease, or unspecified chronic kidney disease: Secondary | ICD-10-CM | POA: Diagnosis not present

## 2019-12-27 DIAGNOSIS — M545 Low back pain: Secondary | ICD-10-CM | POA: Diagnosis not present

## 2019-12-27 DIAGNOSIS — D631 Anemia in chronic kidney disease: Secondary | ICD-10-CM | POA: Diagnosis not present

## 2019-12-27 DIAGNOSIS — I255 Ischemic cardiomyopathy: Secondary | ICD-10-CM | POA: Diagnosis not present

## 2019-12-27 DIAGNOSIS — I5032 Chronic diastolic (congestive) heart failure: Secondary | ICD-10-CM | POA: Diagnosis not present

## 2019-12-27 DIAGNOSIS — G20 Parkinson's disease: Secondary | ICD-10-CM | POA: Diagnosis not present

## 2019-12-27 DIAGNOSIS — I130 Hypertensive heart and chronic kidney disease with heart failure and stage 1 through stage 4 chronic kidney disease, or unspecified chronic kidney disease: Secondary | ICD-10-CM | POA: Diagnosis not present

## 2019-12-27 DIAGNOSIS — I2510 Atherosclerotic heart disease of native coronary artery without angina pectoris: Secondary | ICD-10-CM | POA: Diagnosis not present

## 2019-12-27 DIAGNOSIS — F419 Anxiety disorder, unspecified: Secondary | ICD-10-CM | POA: Diagnosis not present

## 2019-12-27 DIAGNOSIS — N183 Chronic kidney disease, stage 3 unspecified: Secondary | ICD-10-CM | POA: Diagnosis not present

## 2020-01-04 DIAGNOSIS — I130 Hypertensive heart and chronic kidney disease with heart failure and stage 1 through stage 4 chronic kidney disease, or unspecified chronic kidney disease: Secondary | ICD-10-CM | POA: Diagnosis not present

## 2020-01-04 DIAGNOSIS — D631 Anemia in chronic kidney disease: Secondary | ICD-10-CM | POA: Diagnosis not present

## 2020-01-04 DIAGNOSIS — I2510 Atherosclerotic heart disease of native coronary artery without angina pectoris: Secondary | ICD-10-CM | POA: Diagnosis not present

## 2020-01-04 DIAGNOSIS — I5032 Chronic diastolic (congestive) heart failure: Secondary | ICD-10-CM | POA: Diagnosis not present

## 2020-01-04 DIAGNOSIS — G20 Parkinson's disease: Secondary | ICD-10-CM | POA: Diagnosis not present

## 2020-01-04 DIAGNOSIS — F419 Anxiety disorder, unspecified: Secondary | ICD-10-CM | POA: Diagnosis not present

## 2020-01-04 DIAGNOSIS — I255 Ischemic cardiomyopathy: Secondary | ICD-10-CM | POA: Diagnosis not present

## 2020-01-04 DIAGNOSIS — M545 Low back pain: Secondary | ICD-10-CM | POA: Diagnosis not present

## 2020-01-04 DIAGNOSIS — N183 Chronic kidney disease, stage 3 unspecified: Secondary | ICD-10-CM | POA: Diagnosis not present

## 2020-01-09 DIAGNOSIS — I2510 Atherosclerotic heart disease of native coronary artery without angina pectoris: Secondary | ICD-10-CM | POA: Diagnosis not present

## 2020-01-09 DIAGNOSIS — I130 Hypertensive heart and chronic kidney disease with heart failure and stage 1 through stage 4 chronic kidney disease, or unspecified chronic kidney disease: Secondary | ICD-10-CM | POA: Diagnosis not present

## 2020-01-09 DIAGNOSIS — I255 Ischemic cardiomyopathy: Secondary | ICD-10-CM | POA: Diagnosis not present

## 2020-01-09 DIAGNOSIS — D631 Anemia in chronic kidney disease: Secondary | ICD-10-CM | POA: Diagnosis not present

## 2020-01-09 DIAGNOSIS — N183 Chronic kidney disease, stage 3 unspecified: Secondary | ICD-10-CM | POA: Diagnosis not present

## 2020-01-09 DIAGNOSIS — M545 Low back pain: Secondary | ICD-10-CM | POA: Diagnosis not present

## 2020-01-09 DIAGNOSIS — I5032 Chronic diastolic (congestive) heart failure: Secondary | ICD-10-CM | POA: Diagnosis not present

## 2020-01-09 DIAGNOSIS — F419 Anxiety disorder, unspecified: Secondary | ICD-10-CM | POA: Diagnosis not present

## 2020-01-09 DIAGNOSIS — G20 Parkinson's disease: Secondary | ICD-10-CM | POA: Diagnosis not present

## 2020-01-19 ENCOUNTER — Telehealth: Payer: Medicare PPO | Admitting: Family Medicine

## 2020-01-31 ENCOUNTER — Other Ambulatory Visit: Payer: Self-pay | Admitting: Cardiovascular Disease

## 2020-02-07 ENCOUNTER — Ambulatory Visit: Payer: Medicare PPO | Admitting: Family Medicine

## 2020-02-07 ENCOUNTER — Other Ambulatory Visit: Payer: Self-pay

## 2020-02-07 ENCOUNTER — Encounter: Payer: Self-pay | Admitting: Family Medicine

## 2020-02-07 VITALS — BP 120/68 | HR 78 | Temp 98.2°F | Ht 73.0 in | Wt 200.0 lb

## 2020-02-07 DIAGNOSIS — I10 Essential (primary) hypertension: Secondary | ICD-10-CM

## 2020-02-07 DIAGNOSIS — M1710 Unilateral primary osteoarthritis, unspecified knee: Secondary | ICD-10-CM | POA: Diagnosis not present

## 2020-02-07 DIAGNOSIS — E034 Atrophy of thyroid (acquired): Secondary | ICD-10-CM

## 2020-02-07 DIAGNOSIS — N183 Chronic kidney disease, stage 3 unspecified: Secondary | ICD-10-CM

## 2020-02-07 DIAGNOSIS — L03115 Cellulitis of right lower limb: Secondary | ICD-10-CM | POA: Diagnosis not present

## 2020-02-07 DIAGNOSIS — E7800 Pure hypercholesterolemia, unspecified: Secondary | ICD-10-CM

## 2020-02-07 NOTE — Addendum Note (Signed)
Addended by: Liliane Channel on: 02/07/2020 01:51 PM   Modules accepted: Orders

## 2020-02-07 NOTE — Patient Instructions (Addendum)
Health Maintenance Due  Topic Date Due  . INFLUENZA VACCINE  - will complete later in flu season (please let us know if you get this at another location so we can update your chart) . We should have vaccination here in 1-2 months - can call back for an appointment.   01/22/2020   Lets get back on compression stockings in daytime as much as possible to see if we can prevent future cellulitis  Continue lasix daily to try to keep fluid down as well   Finish lamisil- hoping that clears up nails  Please stop by lab before you go If you have mychart- we will send your results within 3 business days of [place] receiving them.  If you do not have mychart- we will call you about results within 5 business days of [place] receiving them.  *please note we are currently using [facility] which has a longer processing time than [facility] typically so labs may not come back as quickly as in the past *please also note that you will see labs on mychart as soon as they post. I will later go in and write notes on them- will say "notes from Dr. Yong Channel"  Recommended follow up: Return in about 6 months (around 08/09/2020) for follow up- or sooner if needed.

## 2020-02-07 NOTE — Addendum Note (Signed)
Addended by: Francella Solian on: 02/07/2020 01:47 PM   Modules accepted: Orders

## 2020-02-07 NOTE — Progress Notes (Signed)
Phone [phone] In person visit   Subjective:   Zachary Decker is a 86 y.o. year old very pleasant male patient who presents for/with See problem oriented charting Chief Complaint  Patient presents with  . Cellulitis   This visit occurred during the SARS-CoV-2 public health emergency.  Safety protocols were in place, including screening questions prior to the visit, additional usage of staff PPE, and extensive cleaning of exam room while observing appropriate contact time as indicated for disinfecting solutions.   Past Medical History-  Patient Active Problem List   Diagnosis Date Noted  . Duodenal ulcer     Priority: High  . MSSA bacteremia 04/25/2019    Priority: High  . Cellulitis of right leg 04/20/2019    Priority: High  . Parkinson's disease ([place]) 04/13/2018    Priority: High  . CAD w/ hx MI s/p CABG 09/07/2012    Priority: High  . Cardiomyopathy, ischemic 03/22/2010    Priority: High  . Atherosclerosis of aorta ([place]) 08/16/2019    Priority: Medium  . Low back pain 04/19/2015    Priority: Medium  . CKD (chronic kidney disease), stage III 04/20/2014    Priority: Medium  . Anxiety state 04/17/2014    Priority: Medium  . HYPERCHOLESTEROLEMIA 10/31/2008    Priority: Medium  . BPH (benign prostatic hyperplasia) 09/28/2007    Priority: Medium  . Hypothyroidism 03/26/2007    Priority: Medium  . Essential hypertension 03/22/2007    Priority: Medium  . Senile purpura ([place]) 04/13/2018    Priority: Low  . ANEMIA, VITAMIN B12 DEFICIENCY 04/22/2010    Priority: Low  . Actinic keratosis 06/25/2009    Priority: Low  . BASAL CELL CARCINOMA, NOSE 02/05/2009    Priority: Low  . Localized osteoarthrosis, lower leg 12/06/2008    Priority: Low  . GERD 03/22/2007    Priority: Low  . Onychomycosis 12/20/2019  . Dehydration 04/20/2019  . Generalized weakness 04/20/2019  . Cellulitis of left foot   . Cellulitis of left leg 10/07/2018  . Insomnia 04/13/2018     Medications- reviewed and updated Current Outpatient Medications  Medication Sig Dispense Refill  . Acetaminophen (TYLENOL EXTRA STRENGTH PO) Take 1-2 tablets by mouth daily as needed (for back pain).    . Ascorbic Acid (VITAMIN C) 1000 MG tablet Take 1,000 mg by mouth daily.    . B Complex-C-Folic Acid (FOLBEE PLUS) TABS Take 1 tablet by mouth daily. 90 tablet 1  . carbidopa-levodopa (SINEMET CR) 50-200 MG tablet Take 1 tablet by mouth at bedtime. 90 tablet 1  . carbidopa-levodopa (SINEMET) 25-100 MG tablet Take 1 tablet by mouth 4 (four) times daily. 1 at 7am/11am/3pm/7pm 360 tablet 1  . Cholecalciferol (VITAMIN D3) 10 MCG/ML LIQD Take by mouth.    . furosemide (LASIX) 40 MG tablet Take 1 tablet (40 mg total) by mouth daily. 90 tablet 3  . hydrOXYzine (ATARAX/VISTARIL) 10 MG tablet TAKE 1 TABLET BY MOUTH AT BEDTIME AS NEEDED 90 tablet 1  . levothyroxine (SYNTHROID) 50 MCG tablet TAKE 1 TABLET BY MOUTH DAILY BEFORE BREAKFAST 90 tablet 1  . lisinopril (ZESTRIL) 2.5 MG tablet TAKE 1 TABLET BY MOUTH EVERY DAY 90 tablet 3  . nitroGLYCERIN (NITROSTAT) 0.4 MG SL tablet Place 1 tablet (0.4 mg total) under the tongue every 5 (five) minutes as needed for chest pain. 20 tablet 1  . pantoprazole (PROTONIX) 40 MG tablet TAKE 1 TABLET BY MOUTH TWICE A DAY 180 tablet 1  . simvastatin (ZOCOR) 20 MG tablet  TAKE 1 TABLET BY MOUTH EVERYDAY AT BEDTIME 90 tablet 3  . tamsulosin (FLOMAX) 0.4 MG CAPS capsule TAKE 1 CAPSULE BY MOUTH EVERY DAY 90 capsule 3  . terbinafine (LAMISIL) 250 MG tablet Take 1 tablet (250 mg total) by mouth daily. 90 tablet 0  . vitamin B-12 (CYANOCOBALAMIN) 500 MCG tablet Take 500 mcg by mouth daily.    Marland Kitchen zinc sulfate 220 (50 Zn) MG capsule Take 220 mg by mouth daily.     No current facility-administered medications for this visit.     Objective:  BP 120/68   Pulse 78   Temp 98.2 F (36.8 C) (Temporal)   Ht 6\' 1"  (1.854 m)   Wt 200 lb (90.7 kg)   SpO2 94%   BMI 26.39 kg/m   Gen: NAD, resting comfortably CV: RRR no murmurs rubs or gallops Lungs: CTAB no crackles, wheeze, rhonchi Abdomen: soft/nontender/nondistended/normal bowel sounds. No rebound or guarding.  Ext: 1+ edema, erythema and redness over lower legs- appears to be venous stasis skin changes. Onychomycosis noted on toes on both feet (on lamisil). He has some erythema between webs of 1st and 2nd toes on both feet- somewhat moist in these areas.  Skin: warm, dry Neuro: wheelchair bound    Assessment and Plan   % Parkinson's disease-following with Dr. Carles Collet and seeing improvement with medication  #Ischemic cardiomyopathy- weight last in person was 182- got down from 220 to 200 with lasix. Swelling largely stable since improving #hypertension S: medication:  Lasix 40 mg daily and  lisinopril 2.5 mg.  BP Readings from Last 3 Encounters:  02/07/20 120/68  12/20/19 115/70  11/17/19 115/61  A/P: Stable hypertension. Cardiomyopathy stable since cardiology visit- if weight trends up they know to let us or cardiology know. Continue current medications.    # recurrent Cellulitis S:Has had some improvement. Still has red areas on both lower legs. No recent hospitalizations since November 2020- patient often presents with worsening weakness along with slight worsening of redness in legs but responds well to antibiotics. Seems to do better since being on parkinson's medicine and early antibiotic therapy A/P: I think there is likely some underlying venous insufficiency/possible lipo dermatosclerosis. Recommended using compression stockings more regularly to help with prevention. We also have him on Lamisil to try to knock out onychomycosis which could be a trigger. Thankfully he has been doing reasonably well recently. Continue to monitor  #Chronic kidney disease stage III S: GFR is typically in the 40s-50srange -Patient knows to avoid NSAIDs . Tylenol only  A/P: Stable. Continue current medications.      #hyperlipidemia S: Medication:simvastain 20mg   Lab Results  Component Value Date   CHOL 96 09/12/2019   HDL 36.20 (L) 09/12/2019   LDLCALC 49 09/12/2019   LDLDIRECT 60.0 04/08/2016   TRIG 51.0 09/12/2019   CHOLHDL 3 09/12/2019   A/P: Excellent control on just simvastatin 20 mg-continue current medication  #hypothyroidism S: compliant On thyroid medication-levothyroxine 50 mcg  Lab Results  Component Value Date   TSH 3.14 09/12/2019  A/P: appears Stable. Continue current medications.   Update tsh  Recommended follow up: Return in about 6 months (around 08/09/2020) for follow up- or sooner if needed. Future Appointments  Date Time Provider [place]  02/15/2020 10:40 AM Burnell Blanks, MD [facility]  02/16/2020  3:30 PM Tat, Eustace Quail, DO [facility] None  05/24/2020  1:00 PM Tat, Eustace Quail, DO [facility] None   Lab/Order associations:   ICD-10-CM   1. Stage 3  chronic kidney disease, unspecified whether stage 3a or 3b CKD  N18.30   2. Cellulitis of right leg  L03.115   3. Essential hypertension  I10 CBC With Differential/Platelet    COMPLETE METABOLIC PANEL WITH GFR  4. Hypothyroidism due to acquired atrophy of thyroid  E03.4 TSH  5. HYPERCHOLESTEROLEMIA  E78.00 CBC With Differential/Platelet    COMPLETE METABOLIC PANEL WITH GFR  Return precautions advised.  Garret Reddish, MD

## 2020-02-08 LAB — COMPLETE METABOLIC PANEL WITH GFR
AG Ratio: 1.2 (calc) (ref 1.0–2.5)
ALT: 9 U/L (ref 9–46)
AST: 25 U/L (ref 10–35)
Albumin: 3.6 g/dL (ref 3.6–5.1)
Alkaline phosphatase (APISO): 63 U/L (ref 35–144)
BUN/Creatinine Ratio: 24 (calc) — ABNORMAL HIGH (ref 6–22)
BUN: 35 mg/dL — ABNORMAL HIGH (ref 7–25)
CO2: 31 mmol/L (ref 20–32)
Calcium: 8.8 mg/dL (ref 8.6–10.3)
Chloride: 102 mmol/L (ref 98–110)
Creat: 1.44 mg/dL — ABNORMAL HIGH (ref 0.70–1.11)
GFR, Est African American: 51 mL/min/{1.73_m2} — ABNORMAL LOW (ref >=60)
GFR, Est Non African American: 44 mL/min/{1.73_m2} — ABNORMAL LOW (ref >=60)
Globulin: 2.9 g/dL (calc) (ref 1.9–3.7)
Glucose, Bld: 105 mg/dL — ABNORMAL HIGH (ref 65–99)
Potassium: 4.5 mmol/L (ref 3.5–5.3)
Sodium: 138 mmol/L (ref 135–146)
Total Bilirubin: 0.6 mg/dL (ref 0.2–1.2)
Total Protein: 6.5 g/dL (ref 6.1–8.1)

## 2020-02-08 LAB — CBC WITH DIFFERENTIAL/PLATELET
Absolute Monocytes: 899 cells/uL (ref 200–950)
Basophils Absolute: 84 cells/uL (ref 0–200)
Basophils Relative: 1 %
Eosinophils Absolute: 605 cells/uL — ABNORMAL HIGH (ref 15–500)
Eosinophils Relative: 7.2 %
HCT: 33.6 % — ABNORMAL LOW (ref 38.5–50.0)
Hemoglobin: 11.5 g/dL — ABNORMAL LOW (ref 13.2–17.1)
Lymphs Abs: 1966 cells/uL (ref 850–3900)
MCH: 34 pg — ABNORMAL HIGH (ref 27.0–33.0)
MCHC: 34.2 g/dL (ref 32.0–36.0)
MCV: 99.4 fL (ref 80.0–100.0)
MPV: 9.1 fL (ref 7.5–12.5)
Monocytes Relative: 10.7 %
Neutro Abs: 4847 cells/uL (ref 1500–7800)
Neutrophils Relative %: 57.7 %
Platelets: 282 10*3/uL (ref 140–400)
RBC: 3.38 10*6/uL — ABNORMAL LOW (ref 4.20–5.80)
RDW: 12.5 % (ref 11.0–15.0)
Total Lymphocyte: 23.4 %
WBC: 8.4 10*3/uL (ref 3.8–10.8)

## 2020-02-08 LAB — TSH: TSH: 4.1 mIU/L (ref 0.40–4.50)

## 2020-02-09 ENCOUNTER — Other Ambulatory Visit: Payer: Self-pay | Admitting: Family Medicine

## 2020-02-14 NOTE — Progress Notes (Signed)
Chief Complaint  Patient presents with  . Follow-up    CAD    History of Present Illness: 86 yo male with history of HTN, HLD, CAD s/p 5V CABG March 2010, ischemic cardiomyopathy, GERD, BPH here today for follow up. He was admitted March 2010 with an inferior MI complicated by VF arrest multiple times and unsuccessful attempt at PCI in the setting of severe multi-vessel CAD. He then had an urgent 5 V CABG. He is known to have a cardiomyopathy with last echo 2013 showing LVEF 35-40%. Stress myoview 03/08/13 with scar but no ischemia, LVEF=46%.He has refused to repeat the echo over the past few years. He was admitted to [facility] April 2020 with left leg cellulitis and sepsis with generalized weakness. Recent diagnosis of Parkinson's disease. He was seen in our office 11/09/19 with c/o worsened LE edema and recurrent cellulitis. He was taking Lasix 20 mg every other day. Keflex restarted and Lasix increased to 40 mg daily. His LE edema improved with the increased Lasix dosage.   He is here today for follow up. The patient denies any chest pain, dyspnea, palpitations, orthopnea, PND, dizziness, near syncope or syncope. Overall feeling well. LE edema is mostly resolved.   Primary Care Physician: [name], MD  Past Medical History:  Diagnosis Date  . Acute myocardial infarction of inferoposterior wall, subsequent episode of care ([place])   . CAD (coronary artery disease)   . Encounter for long-term (current) use of other medications   . Family history of malignant neoplasm of gastrointestinal tract   . GERD (gastroesophageal reflux disease)   . Hypercholesterolemia   . Hypertension   . Hypertrophy of prostate with urinary obstruction and other lower urinary tract symptoms (LUTS)   . Personal history of thrombophlebitis   . PVD (peripheral vascular disease) ([place])   . Respiratory failure ([place])   . Unspecified adverse effect of unspecified drug, medicinal and biological substance   . Unspecified  hypothyroidism     Past Surgical History:  Procedure Laterality Date  . BIOPSY  04/28/2019   Procedure: BIOPSY;  Surgeon: Thornton Park, MD;  Location: [place];  Service: Gastroenterology;;  . CATARACT EXTRACTION, BILATERAL    . CHOLECYSTECTOMY  02/26/2012   Procedure: LAPAROSCOPIC CHOLECYSTECTOMY WITH INTRAOPERATIVE CHOLANGIOGRAM;  Surgeon: Madilyn Hook, DO;  Location: [place];  Service: General;  Laterality: N/A;  . CORONARY ARTERY BYPASS GRAFT     x 5 on August 29, 2008  . ESOPHAGOGASTRODUODENOSCOPY (EGD) WITH PROPOFOL N/A 04/28/2019   Procedure: ESOPHAGOGASTRODUODENOSCOPY (EGD) WITH PROPOFOL;  Surgeon: Thornton Park, MD;  Location: [place];  Service: Gastroenterology;  Laterality: N/A;  . REPLACEMENT TOTAL KNEE Left   . tachecostomy placement     after heart attack  . TONSILLECTOMY      Current Outpatient Medications  Medication Sig Dispense Refill  . Acetaminophen (TYLENOL EXTRA STRENGTH PO) Take 1-2 tablets by mouth daily as needed (for back pain).    . Ascorbic Acid (VITAMIN C) 1000 MG tablet Take 1,000 mg by mouth daily.    . B Complex-C-Folic Acid (FOLBEE PLUS) TABS Take 1 tablet by mouth daily. 90 tablet 1  . carbidopa-levodopa (SINEMET CR) 50-200 MG tablet Take 1 tablet by mouth at bedtime. 90 tablet 1  . carbidopa-levodopa (SINEMET) 25-100 MG tablet Take 1 tablet by mouth 4 (four) times daily. 1 at 7am/11am/3pm/7pm 360 tablet 1  . Cholecalciferol (VITAMIN D3) 10 MCG/ML LIQD Take by mouth.    . furosemide (LASIX) 40 MG tablet Take 1  tablet (40 mg total) by mouth daily. 90 tablet 3  . hydrOXYzine (ATARAX/VISTARIL) 10 MG tablet TAKE 1 TABLET BY MOUTH AT BEDTIME AS NEEDED 90 tablet 1  . levothyroxine (SYNTHROID) 50 MCG tablet TAKE 1 TABLET BY MOUTH DAILY BEFORE BREAKFAST 90 tablet 1  . lisinopril (ZESTRIL) 2.5 MG tablet TAKE 1 TABLET BY MOUTH EVERY DAY 90 tablet 3  . nitroGLYCERIN (NITROSTAT) 0.4 MG SL tablet Place 1 tablet (0.4 mg total) under the tongue every 5  (five) minutes as needed for chest pain. 20 tablet 1  . pantoprazole (PROTONIX) 40 MG tablet TAKE 1 TABLET BY MOUTH TWICE A DAY 180 tablet 1  . simvastatin (ZOCOR) 20 MG tablet TAKE 1 TABLET BY MOUTH EVERYDAY AT BEDTIME 90 tablet 3  . tamsulosin (FLOMAX) 0.4 MG CAPS capsule TAKE 1 CAPSULE BY MOUTH EVERY DAY 90 capsule 3  . terbinafine (LAMISIL) 250 MG tablet Take 1 tablet (250 mg total) by mouth daily. 90 tablet 0  . vitamin B-12 (CYANOCOBALAMIN) 500 MCG tablet Take 500 mcg by mouth daily.    Marland Kitchen zinc sulfate 220 (50 Zn) MG capsule Take 220 mg by mouth daily.     No current facility-administered medications for this visit.    Allergies  Allergen Reactions  . Losartan Potassium Other (See Comments)    Not known  . Naproxen Nausea And Vomiting  . Penicillins Rash    Did it involve swelling of the face/tongue/throat, SOB, or low BP? No Did it involve sudden or severe rash/hives, skin peeling, or any reaction on the inside of your mouth or nose? Yes Did you need to seek medical attention at a hospital or doctor's office? Unknown When did it last happen? If all above answers are "NO", may proceed with cephalosporin use.  TOLERATED CEFTRIAXONE 10/07/2018    Social History   Socioeconomic History  . Marital status: Married    Spouse name: Apolonio Schneiders  . Number of children: 2  . Years of education: Not on file  . Highest education level: Master's degree (e.g., MA, MS, MEng, MEd, MSW, MBA)  Occupational History    Comment: Retired Careers adviser   Tobacco Use  . Smoking status: Never Smoker  . Smokeless tobacco: Never Used  Vaping Use  . Vaping Use: Never used  Substance and Sexual Activity  . Alcohol use: No  . Drug use: No  . Sexual activity: Not on file  Other Topics Concern  . Not on file  Social History Narrative   Married 1958. 2 kids (boy, girl). 2 grandkids (boy and girl).       Retired from [employer] ([employer] in [place])      [place]: former Air cabin crew, tv,  reading      Right handed      Lives in a single story home with wife and Daughter Linus Orn.   Social Determinants of Health   Financial Resource Strain:   . Difficulty of Paying Living Expenses: Not on file  Food Insecurity:   . Worried About Charity fundraiser in the Last Year: Not on file  . Ran Out of Food in the Last Year: Not on file  Transportation Needs:   . Lack of Transportation (Medical): Not on file  . Lack of Transportation (Non-Medical): Not on file  Physical Activity:   . Days of Exercise per Week: Not on file  . Minutes of Exercise per Session: Not on file  Stress:   . Feeling of Stress : Not on file  Social Connections:   . Frequency of Communication with Friends and Family: Not on file  . Frequency of Social Gatherings with Friends and Family: Not on file  . Attends Religious Services: Not on file  . Active Member of Clubs or Organizations: Not on file  . Attends Archivist Meetings: Not on file  . Marital Status: Not on file  Intimate Partner Violence:   . Fear of Current or Ex-Partner: Not on file  . Emotionally Abused: Not on file  . Physically Abused: Not on file  . Sexually Abused: Not on file    Family History  Problem Relation Age of Onset  . Heart disease Mother   . Tuberculosis Father   . Colon cancer Brother     Review of Systems:  As stated in the HPI and otherwise negative.   BP 112/68   Pulse (!) 56   Ht 6\' 1"  (1.854 m)   Wt 193 lb (87.5 kg)   BMI 25.46 kg/m   Physical Examination: General: Well developed, well nourished, NAD  HEENT: OP clear, mucus membranes moist  SKIN: warm, dry. No rashes. Neuro: No focal deficits  Musculoskeletal: Muscle strength 5/5 all ext  Psychiatric: Mood and affect normal  Neck: No JVD, no carotid bruits, no thyromegaly, no lymphadenopathy.  Lungs:Clear bilaterally, no wheezes, rhonci, crackles Cardiovascular: Regular rate and rhythm. No murmurs, gallops or rubs. Abdomen:Soft. Bowel sounds  present. Non-tender.  Extremities: Trace to 1+ Bilateral lower extremity edema with chronic venous stasis changes.   Echo October 2020: 1. Left ventricular ejection fraction, by visual estimation, is 45 to  50%. The left ventricle has mildly decreased function. There is no left  ventricular hypertrophy.  2. Basal and mid inferolateral wall is abnormal.  3. Left ventricular diastolic parameters are consistent with Grade I  diastolic dysfunction (impaired relaxation).  4. Global right ventricle has normal systolic function.The right  ventricular size is normal. No increase in right ventricular wall  thickness.  5. Left atrial size was normal.  6. Right atrial size was normal.  7. The mitral valve is normal in structure. Mild mitral valve  regurgitation. No evidence of mitral stenosis.  8. The tricuspid valve is normal in structure. Tricuspid valve  regurgitation is mild.  9. The aortic valve is tricuspid. Aortic valve regurgitation is trivial.  No evidence of aortic valve sclerosis or stenosis.  10. There is Moderate calcification of the aortic valve.  11. There is Moderate thickening of the aortic valve.  12. The pulmonic valve was normal in structure. Pulmonic valve  regurgitation is trivial.  13. Mildly elevated pulmonary artery systolic pressure.  14. The inferior vena cava is normal in size with greater than 50%  respiratory variability, suggesting right atrial pressure of 3 mmHg.  15. No vegetations detected.   EKG:  EKG is not ordered today. The ekg ordered today demonstrates   Recent Labs: 04/30/2019: Magnesium 1.8 02/07/2020: ALT 9; BUN 35; Creat 1.44; Hemoglobin 11.5; Platelets 282; Potassium 4.5; Sodium 138; TSH 4.10   Lipid Panel    Component Value Date/Time   CHOL 96 09/12/2019 0850   TRIG 51.0 09/12/2019 0850   HDL 36.20 (L) 09/12/2019 0850   CHOLHDL 3 09/12/2019 0850   VLDL 10.2 09/12/2019 0850   LDLCALC 49 09/12/2019 0850   LDLDIRECT 60.0 04/08/2016  0925     Wt Readings from Last 3 Encounters:  02/15/20 193 lb (87.5 kg)  02/07/20 200 lb (90.7 kg)  12/20/19 204 lb (92.5 kg)  Other studies Reviewed: Additional studies/ records that were reviewed today include: . Review of the above records demonstrates:    Assessment and Plan:   1. CAD s/p CABG without angina: He has no chest pain. Will continue ASA, beta blocker and statin.     2. Ischemic Cardiomyopathy:  Last LVEF 45-50% by echo in October 2020. Will continue the beta blocker and Ace-inh.   3. HTN: BP is well controlled. Continue current therapy.   4. Hyperlipidemia: Lipids well controlled. LDL 49 in March 2021. Will continue statin  5. LE cellulitis: Improved. Now with chronic venous stasis only.   6. Chronic diastolic CHF: Weight and LE edema stable. Continue Lasix.40 mg daily  Current medicines are reviewed at length with the patient today.  The patient does not have concerns regarding medicines.  The following changes have been made:  no change  Labs/ tests ordered today include:   No orders of the defined types were placed in this encounter.   Disposition:   FU with me in 6 months    Signed, Lauree Chandler, MD 02/15/2020 11:29 AM    [facility] [address], [place], [place]  [address] Phone: [phone]; Fax: [phone]

## 2020-02-15 ENCOUNTER — Ambulatory Visit: Payer: Medicare PPO | Admitting: Cardiovascular Disease

## 2020-02-15 ENCOUNTER — Encounter: Payer: Self-pay | Admitting: Cardiovascular Disease

## 2020-02-15 ENCOUNTER — Other Ambulatory Visit: Payer: Self-pay

## 2020-02-15 VITALS — BP 112/68 | HR 56 | Ht 73.0 in | Wt 193.0 lb

## 2020-02-15 DIAGNOSIS — I10 Essential (primary) hypertension: Secondary | ICD-10-CM | POA: Diagnosis not present

## 2020-02-15 DIAGNOSIS — E7800 Pure hypercholesterolemia, unspecified: Secondary | ICD-10-CM | POA: Diagnosis not present

## 2020-02-15 DIAGNOSIS — I255 Ischemic cardiomyopathy: Secondary | ICD-10-CM

## 2020-02-15 DIAGNOSIS — I5032 Chronic diastolic (congestive) heart failure: Secondary | ICD-10-CM

## 2020-02-15 DIAGNOSIS — I2510 Atherosclerotic heart disease of native coronary artery without angina pectoris: Secondary | ICD-10-CM

## 2020-02-15 NOTE — Patient Instructions (Signed)

## 2020-02-15 NOTE — Progress Notes (Signed)
Assessment/Plan:   1.  Parkinsons Disease  -Increase carbidopa/levodopa 25/100, 2 tablets at 7 AM, 2 tablets at 11 AM, 1 tablet at 3 PM, 1 tablet at 7 PM.  They will let me know if they see any cognitive change.  -Continue carbidopa/levodopa 50/200 at bedtime  2.  Abnormal brain scan  -We have opted to hold off on LP, given that levodopa has shown effectiveness.  This is certainly an option.  3.  Eyelid opening apraxia  -Has opted to hold off on Botox.  This is actually gotten much better with levodopa   Subjective:   Zachary Decker was seen today in follow up for Parkinsons disease.  My previous records were reviewed prior to todays visit as well as outside records available to me. Pt denies falls.  Pt denies lightheadedness, near syncope.  No hallucinations.  Mood has been good.  They emailed me recently to state that he was still stiff in the morning, despite adding nighttime levodopa.  They want to consider going up on medication.  He is having more trouble ambulating.  His left leg drags.  Daughter does think that levodopa has overall helped significantly compared to when he was not on it.  She states that the eyelid opening apraxia is markedly better.  She states that he is not drooling like he used to.  Current prescribed movement disorder medications: Carbidopa/levodopa 25/100, 1 tablet 4 times per day Carbidopa/levodopa 50/200 at bedtime (added last visit to help roll over in bed and help with first morning on)    ALLERGIES:   Allergies  Allergen Reactions  . Losartan Potassium Other (See Comments)    Not known  . Naproxen Nausea And Vomiting  . Penicillins Rash    Did it involve swelling of the face/tongue/throat, SOB, or low BP? No Did it involve sudden or severe rash/hives, skin peeling, or any reaction on the inside of your mouth or nose? Yes Did you need to seek medical attention at a hospital or doctor's office? Unknown When did it last happen? If all  above answers are "NO", may proceed with cephalosporin use.  TOLERATED CEFTRIAXONE 10/07/2018    CURRENT MEDICATIONS:  Outpatient Encounter Medications as of 02/16/2020  Medication Sig  . Acetaminophen (TYLENOL EXTRA STRENGTH PO) Take 1-2 tablets by mouth daily as needed (for back pain).  . Ascorbic Acid (VITAMIN C) 1000 MG tablet Take 1,000 mg by mouth daily.  . B Complex-C-Folic Acid (FOLBEE PLUS) TABS Take 1 tablet by mouth daily.  . carbidopa-levodopa (SINEMET CR) 50-200 MG tablet Take 1 tablet by mouth at bedtime.  . carbidopa-levodopa (SINEMET) 25-100 MG tablet Take 1 tablet by mouth 4 (four) times daily. 1 at 7am/11am/3pm/7pm  . Cholecalciferol (VITAMIN D3) 125 MCG (5000 UT) TBDP Take 1 tablet by mouth daily.   . furosemide (LASIX) 40 MG tablet Take 1 tablet (40 mg total) by mouth daily.  . hydrOXYzine (ATARAX/VISTARIL) 10 MG tablet TAKE 1 TABLET BY MOUTH AT BEDTIME AS NEEDED  . levothyroxine (SYNTHROID) 50 MCG tablet TAKE 1 TABLET BY MOUTH DAILY BEFORE BREAKFAST  . lisinopril (ZESTRIL) 2.5 MG tablet TAKE 1 TABLET BY MOUTH EVERY DAY  . nitroGLYCERIN (NITROSTAT) 0.4 MG SL tablet Place 1 tablet (0.4 mg total) under the tongue every 5 (five) minutes as needed for chest pain.  . pantoprazole (PROTONIX) 40 MG tablet TAKE 1 TABLET BY MOUTH TWICE A DAY  . simvastatin (ZOCOR) 20 MG tablet TAKE 1 TABLET BY MOUTH EVERYDAY AT BEDTIME  .  tamsulosin (FLOMAX) 0.4 MG CAPS capsule TAKE 1 CAPSULE BY MOUTH EVERY DAY  . terbinafine (LAMISIL) 250 MG tablet Take 1 tablet (250 mg total) by mouth daily.  . vitamin B-12 (CYANOCOBALAMIN) 500 MCG tablet Take 500 mcg by mouth daily.  Marland Kitchen zinc sulfate 220 (50 Zn) MG capsule Take 220 mg by mouth daily.   No facility-administered encounter medications on file as of 02/16/2020.    Objective:   PHYSICAL EXAMINATION:    VITALS:   Vitals:   02/16/20 1535  BP: (!) 107/58  Pulse: 61  SpO2: 95%  Weight: 195 lb (88.5 kg)  Height: 6\' 1"  (1.854 m)    GEN:  The  patient appears stated age and is in NAD. HEENT:  Normocephalic, atraumatic.  The mucous membranes are moist. The superficial temporal arteries are without ropiness or tenderness. CV:  RRR Lungs:  CTAB Neck/HEME:  There are no carotid bruits bilaterally.  Neurological examination:  Orientation: The patient is alert and oriented x3. Cranial nerves: There is good facial symmetry with facial hypomimia. The speech is fluent and clear. Soft palate rises symmetrically and there is no tongue deviation. Hearing is intact to conversational tone. Sensation: Sensation is intact to light touch throughout Motor: Strength is at least antigravity x4.  Movement examination: Tone: There is mild to mod tone in the LUE>RUE Abnormal movements: none Coordination:  There is  decremation with RAM's, L>R Gait and Station: The patient pushes up with assist (of gait belt and daughter).  He is given a walker.  He is antalgic.  He is slow and flexed at the waist.  He shuffles.    I have reviewed and interpreted the following labs independently    Chemistry      Component Value Date/Time   NA 138 02/07/2020 1351   NA 141 11/17/2019 1124   K 4.5 02/07/2020 1351   CL 102 02/07/2020 1351   CO2 31 02/07/2020 1351   BUN 35 (H) 02/07/2020 1351   BUN 30 (H) 11/17/2019 1124   CREATININE 1.44 (H) 02/07/2020 1351   GLU 77 05/23/2019 0000      Component Value Date/Time   CALCIUM 8.8 02/07/2020 1351   ALKPHOS 65 12/21/2019 1105   AST 25 02/07/2020 1351   ALT 9 02/07/2020 1351   BILITOT 0.6 02/07/2020 1351       Lab Results  Component Value Date   WBC 8.4 02/07/2020   HGB 11.5 (L) 02/07/2020   HCT 33.6 (L) 02/07/2020   MCV 99.4 02/07/2020   PLT 282 02/07/2020    Lab Results  Component Value Date   TSH 4.10 02/07/2020     Total time spent on today's visit was 30 minutes, including both face-to-face time and nonface-to-face time.  Time included that spent on review of records (prior notes available to  me/labs/imaging if pertinent), discussing treatment and goals, answering patient's questions and coordinating care.  Cc:  [name], MD

## 2020-02-16 ENCOUNTER — Other Ambulatory Visit: Payer: Self-pay

## 2020-02-16 ENCOUNTER — Ambulatory Visit: Payer: Medicare PPO | Admitting: Neurology

## 2020-02-16 ENCOUNTER — Encounter: Payer: Self-pay | Admitting: Neurology

## 2020-02-16 VITALS — BP 107/58 | HR 61 | Ht 73.0 in | Wt 195.0 lb

## 2020-02-16 DIAGNOSIS — G20 Parkinson's disease: Secondary | ICD-10-CM

## 2020-02-16 MED ORDER — CARBIDOPA-LEVODOPA ER 50-200 MG PO TBCR: 1.0000 | EXTENDED_RELEASE_TABLET | Freq: Every day | ORAL | 1 refills | Status: DC

## 2020-02-16 MED ORDER — CARBIDOPA-LEVODOPA 25-100 MG PO TABS: ORAL_TABLET | ORAL | 1 refills | Status: DC

## 2020-02-16 NOTE — Patient Instructions (Addendum)
1.  Take carbidopa/levodopa 25/100, 2 at 7am, 2 at 11am, 1 at 3pm, 1 at 7pm 2.  Take carbidopa/levodopa 50/200 CR at bed  The physicians and staff at [facility] are committed to providing excellent care. You may receive a survey requesting feedback about your experience at our office. We strive to receive "very good" responses to the survey questions. If you feel that your experience would prevent you from giving the office a "very good " response, please contact our office to try to remedy the situation. We may be reached at [phone]. Thank you for taking the time out of your busy day to complete the survey.

## 2020-02-25 ENCOUNTER — Other Ambulatory Visit: Payer: Self-pay | Admitting: Neurology

## 2020-02-29 ENCOUNTER — Other Ambulatory Visit: Payer: Self-pay

## 2020-02-29 MED ORDER — CARBIDOPA-LEVODOPA 25-100 MG PO TABS: ORAL_TABLET | ORAL | 0 refills | Status: DC

## 2020-02-29 NOTE — Telephone Encounter (Signed)
Rx(s) sent to pharmacy electronically.  

## 2020-03-03 ENCOUNTER — Other Ambulatory Visit: Payer: Self-pay | Admitting: Family Medicine

## 2020-04-12 ENCOUNTER — Encounter: Payer: Self-pay | Admitting: Family Medicine

## 2020-04-12 ENCOUNTER — Ambulatory Visit: Payer: Medicare PPO | Admitting: Family Medicine

## 2020-04-12 ENCOUNTER — Other Ambulatory Visit: Payer: Self-pay

## 2020-04-12 VITALS — BP 122/74 | HR 58 | Temp 98.1°F | Resp 18 | Ht 73.0 in | Wt 192.0 lb

## 2020-04-12 DIAGNOSIS — M25422 Effusion, left elbow: Secondary | ICD-10-CM | POA: Diagnosis not present

## 2020-04-12 MED ORDER — CEPHALEXIN 500 MG PO CAPS: 500.0000 mg | ORAL_CAPSULE | Freq: Two times a day (BID) | ORAL | 0 refills | Status: AC

## 2020-04-12 NOTE — Progress Notes (Signed)
Phone [phone] In person visit   Subjective:   Zachary Decker is a 86 y.o. year old very pleasant male patient who presents for/with See problem oriented charting Chief Complaint  Patient presents with  . Joint Swelling   This visit occurred during the SARS-CoV-2 public health emergency.  Safety protocols were in place, including screening questions prior to the visit, additional usage of staff PPE, and extensive cleaning of exam room while observing appropriate contact time as indicated for disinfecting solutions.   Past Medical History-  Patient Active Problem List   Diagnosis Date Noted  . Duodenal ulcer     Priority: High  . MSSA bacteremia 04/25/2019    Priority: High  . Cellulitis of right leg 04/20/2019    Priority: High  . Parkinson's disease ([place]) 04/13/2018    Priority: High  . CAD w/ hx MI s/p CABG 09/07/2012    Priority: High  . Cardiomyopathy, ischemic 03/22/2010    Priority: High  . Atherosclerosis of aorta ([place]) 08/16/2019    Priority: Medium  . Low back pain 04/19/2015    Priority: Medium  . CKD (chronic kidney disease), stage III ([place]) 04/20/2014    Priority: Medium  . Anxiety state 04/17/2014    Priority: Medium  . HYPERCHOLESTEROLEMIA 10/31/2008    Priority: Medium  . BPH (benign prostatic hyperplasia) 09/28/2007    Priority: Medium  . Hypothyroidism 03/26/2007    Priority: Medium  . Essential hypertension 03/22/2007    Priority: Medium  . Senile purpura ([place]) 04/13/2018    Priority: Low  . ANEMIA, VITAMIN B12 DEFICIENCY 04/22/2010    Priority: Low  . Actinic keratosis 06/25/2009    Priority: Low  . BASAL CELL CARCINOMA, NOSE 02/05/2009    Priority: Low  . Localized osteoarthrosis, lower leg 12/06/2008    Priority: Low  . GERD 03/22/2007    Priority: Low  . Onychomycosis 12/20/2019  . Dehydration 04/20/2019  . Generalized weakness 04/20/2019  . Cellulitis of left foot   . Cellulitis of left leg 10/07/2018  . Insomnia 04/13/2018     Medications- reviewed and updated Current Outpatient Medications  Medication Sig Dispense Refill  . Acetaminophen (TYLENOL EXTRA STRENGTH PO) Take 1-2 tablets by mouth daily as needed (for back pain).    . Ascorbic Acid (VITAMIN C) 1000 MG tablet Take 1,000 mg by mouth daily.    . B Complex-C-Folic Acid (FOLBEE PLUS) TABS Take 1 tablet by mouth daily. 90 tablet 1  . carbidopa-levodopa (SINEMET CR) 50-200 MG tablet Take 1 tablet by mouth at bedtime. 90 tablet 1  . carbidopa-levodopa (SINEMET) 25-100 MG tablet Take 2 at 7am, 2 at 11am, 1 at 3pm, 1 at 7pm 540 tablet 0  . Cholecalciferol (VITAMIN D3) 125 MCG (5000 UT) TBDP Take 1 tablet by mouth daily.     . furosemide (LASIX) 40 MG tablet Take 1 tablet (40 mg total) by mouth daily. 90 tablet 3  . hydrOXYzine (ATARAX/VISTARIL) 10 MG tablet TAKE 1 TABLET BY MOUTH AT BEDTIME AS NEEDED 90 tablet 1  . levothyroxine (SYNTHROID) 50 MCG tablet TAKE 1 TABLET BY MOUTH DAILY BEFORE BREAKFAST 90 tablet 1  . lisinopril (ZESTRIL) 2.5 MG tablet TAKE 1 TABLET BY MOUTH EVERY DAY 90 tablet 3  . nitroGLYCERIN (NITROSTAT) 0.4 MG SL tablet Place 1 tablet (0.4 mg total) under the tongue every 5 (five) minutes as needed for chest pain. 20 tablet 1  . pantoprazole (PROTONIX) 40 MG tablet TAKE 1 TABLET BY MOUTH TWICE A DAY 180  tablet 1  . simvastatin (ZOCOR) 20 MG tablet TAKE 1 TABLET BY MOUTH EVERYDAY AT BEDTIME 90 tablet 3  . tamsulosin (FLOMAX) 0.4 MG CAPS capsule TAKE 1 CAPSULE BY MOUTH EVERY DAY 90 capsule 3  . vitamin B-12 (CYANOCOBALAMIN) 500 MCG tablet Take 500 mcg by mouth daily.    Marland Kitchen zinc sulfate 220 (50 Zn) MG capsule Take 220 mg by mouth daily.     No current facility-administered medications for this visit.     Objective:  BP 122/74   Pulse (!) 58   Temp 98.1 F (36.7 C) (Temporal)   Resp 18   Ht 6\' 1"  (1.854 m)   Wt 192 lb (87.1 kg)   SpO2 94%   BMI 25.33 kg/m  Gen: NAD, resting comfortably CV: RRR no murmurs rubs or gallops Lungs: CTAB  no crackles, wheeze, rhonchi  Ext: 1+ edema on bilateral legs-venous stasis skin changes with minimal hair.  Thickened yellowed toenails did not improve on Lamisil.   Msk: Minimal swelling directly over olecranon bursa but has an area perhaps 10 x 7 cm up into the back of the left arm that seems fluid-filled and swollen.  Somewhat warm but not tender to touch.  Patient can fully extend his elbow at this time Skin: warm, dry     Assessment and Plan  # Left Elbow Swelling S:Patient mentioned that he noticed the swelling in his elbow last 2022/09/30 pronounced and started a few days before that. He denies hitting his elbow on anything but does rest on that elbow quite a fair amount. Has also been doing some working out at home with his arms. Some redness to his left forearm but htat is his baseline. Patient stated that the swelling has gotten better. No pain when his arm is resting. Some tightness and pain when he moves his arm around.  Some warmth but minimal pain or redness around the presumed swollen bursa A/P: Highest on my differential is an atypical presentation for olecranon bursitis in regards to distribution of the bursa.  Patient has been working out recently at home and I suspect the friction when he is doing curls as well as his normal position of resting on the left elbow may have been a trigger.  There is no obvious opening of the skin and given the improvement-strongly doubt this is infectious or specifically septic bursitis.  Due to atypical presentation I did recommend sports medicine referral but patient wants to hold off for now-I think that is okay given improvement.  He did ask for me to prescribe an antibiotic in case area worsens while I am out of office tomorrow-I would still like to urgently refer him to orthopedics if it does worsen-he will reach out if this occurs and my team can place this order  -Also discussed possible orthosis can be provided through sports medicine potentially  but he preferred Ace wrap which was applied today  #We also looked at his toes which failed to make substantial improvement with Lamisil-we will not retrial at this time.  Swelling appears stable.  He asked me if a fluid pill would help with the swelling.  In his elbow and I told him unlikely and we opted to hold off on this  Recommended follow up: As needed for acute concerns Future Appointments  Date Time Provider [place]  05/24/2020  1:00 PM Tat, Eustace Quail, DO [facility] None  08/14/2020  1:20 PM [name], MD [facility]  08/17/2020 12:00 PM [place],  Annita Brod, MD [facility]    Lab/Order associations:   ICD-10-CM   1. Elbow swelling, left  M25.422 Ambulatory referral to Sports Medicine    CBC With Differential/Platelet    COMPLETE METABOLIC PANEL WITH GFR    CBC With Differential/Platelet    COMPLETE METABOLIC PANEL WITH GFR    Meds ordered this encounter  Medications  . cephALEXin (KEFLEX) 500 MG capsule    Sig: Take 1 capsule (500 mg total) by mouth 2 (two) times daily for 7 days.    Dispense:  14 capsule    Refill:  0    Time Spent: 30 minutes of total time (11:30 AM- 12:00 PM) was spent on the date of the encounter performing the following actions: chart review prior to seeing the patient, obtaining history, performing a medically necessary exam, counseling on the treatment plan, placing orders, and documenting in our EHR.   Return precautions advised.  Garret Reddish, MD

## 2020-04-12 NOTE — Patient Instructions (Addendum)
Health Maintenance Due  Topic Date Due  . INFLUENZA VACCINE Declined in office flu shot  01/22/2020   Please stop by lab before you go If you have mychart- we will send your results within 3 business days of [place] receiving them.  If you do not have mychart- we will call you about results within 5 business days of [place] receiving them.  *please note we are currently using [facility] which has a longer processing time than [place] typically so labs may not come back as quickly as in the past *please also note that you will see labs on mychart as soon as they post. I will later go in and write notes on them- will say "notes from Dr. Yong Channel"  Keflex sent in but only use if worsening symptoms. If worsening swelling or failure to improve would want to etiher send to sports medicine (if not improving) or orthopedics (if worsens).   I like ice 20 minutes 3-4x a day.   Since improving you preferred to hold off on referral at this time.   Try to avoid placing pressure on the arm/elbow.   Recommended follow up: if failure to improve.

## 2020-04-13 LAB — COMPLETE METABOLIC PANEL WITH GFR
AG Ratio: 1.3 (calc) (ref 1.0–2.5)
ALT: 6 U/L — ABNORMAL LOW (ref 9–46)
AST: 22 U/L (ref 10–35)
Albumin: 3.5 g/dL — ABNORMAL LOW (ref 3.6–5.1)
Alkaline phosphatase (APISO): 58 U/L (ref 35–144)
BUN/Creatinine Ratio: 19 (calc) (ref 6–22)
BUN: 27 mg/dL — ABNORMAL HIGH (ref 7–25)
CO2: 28 mmol/L (ref 20–32)
Calcium: 8.7 mg/dL (ref 8.6–10.3)
Chloride: 103 mmol/L (ref 98–110)
Creat: 1.4 mg/dL — ABNORMAL HIGH (ref 0.70–1.11)
GFR, Est African American: 53 mL/min/{1.73_m2} — ABNORMAL LOW (ref >=60)
GFR, Est Non African American: 46 mL/min/{1.73_m2} — ABNORMAL LOW (ref >=60)
Globulin: 2.8 g/dL (calc) (ref 1.9–3.7)
Glucose, Bld: 93 mg/dL (ref 65–99)
Potassium: 4.1 mmol/L (ref 3.5–5.3)
Sodium: 139 mmol/L (ref 135–146)
Total Bilirubin: 0.3 mg/dL (ref 0.2–1.2)
Total Protein: 6.3 g/dL (ref 6.1–8.1)

## 2020-04-13 LAB — CBC WITH DIFFERENTIAL/PLATELET
Absolute Monocytes: 919 cells/uL (ref 200–950)
Basophils Absolute: 55 cells/uL (ref 0–200)
Basophils Relative: 0.6 %
Eosinophils Absolute: 464 cells/uL (ref 15–500)
Eosinophils Relative: 5.1 %
HCT: 32.6 % — ABNORMAL LOW (ref 38.5–50.0)
Hemoglobin: 10.8 g/dL — ABNORMAL LOW (ref 13.2–17.1)
Lymphs Abs: 1538 cells/uL (ref 850–3900)
MCH: 34.6 pg — ABNORMAL HIGH (ref 27.0–33.0)
MCHC: 33.1 g/dL (ref 32.0–36.0)
MCV: 104.5 fL — ABNORMAL HIGH (ref 80.0–100.0)
MPV: 8.8 fL (ref 7.5–12.5)
Monocytes Relative: 10.1 %
Neutro Abs: 6124 cells/uL (ref 1500–7800)
Neutrophils Relative %: 67.3 %
Platelets: 345 10*3/uL (ref 140–400)
RBC: 3.12 10*6/uL — ABNORMAL LOW (ref 4.20–5.80)
RDW: 12.1 % (ref 11.0–15.0)
Total Lymphocyte: 16.9 %
WBC: 9.1 10*3/uL (ref 3.8–10.8)

## 2020-04-14 ENCOUNTER — Encounter: Payer: Self-pay | Admitting: Family Medicine

## 2020-04-16 ENCOUNTER — Other Ambulatory Visit: Payer: Self-pay

## 2020-04-16 DIAGNOSIS — D518 Other vitamin B12 deficiency anemias: Secondary | ICD-10-CM

## 2020-04-16 DIAGNOSIS — D619 Aplastic anemia, unspecified: Secondary | ICD-10-CM

## 2020-04-25 ENCOUNTER — Telehealth: Payer: Self-pay

## 2020-04-25 NOTE — Telephone Encounter (Signed)
Pt was triaged by team health for lethargy & low BP. Outcome was for pt to call 911. Pt is refusing to call 911. Pt wants to see Dr. Yong Channel in office. Please advise.

## 2020-04-26 ENCOUNTER — Other Ambulatory Visit: Payer: Medicare PPO

## 2020-04-26 ENCOUNTER — Other Ambulatory Visit: Payer: Self-pay

## 2020-04-26 ENCOUNTER — Other Ambulatory Visit: Payer: Self-pay | Admitting: Family Medicine

## 2020-04-26 DIAGNOSIS — D518 Other vitamin B12 deficiency anemias: Secondary | ICD-10-CM

## 2020-04-26 NOTE — Telephone Encounter (Signed)
I would love to but I do not have any availability today-I am just now getting this message.  Please call to check on him-I had another patient will try to fit in on 920 tomorrow but if they are unable to come for that slot potentially we could see him then if he is not worsening.  Certainly hold lisinopril for now

## 2020-04-26 NOTE — Telephone Encounter (Signed)
Called and spoke with pt daughter and she states 9:20 is to early for pt. She will wait to hear about the lab results since they are headed to the office now to have that done and then go from there. She states if his wbc comes back critical then she will take him to [facility].

## 2020-04-26 NOTE — Telephone Encounter (Signed)
See below

## 2020-04-27 ENCOUNTER — Encounter: Payer: Self-pay | Admitting: Family Medicine

## 2020-04-27 LAB — CBC WITH DIFFERENTIAL/PLATELET
Absolute Monocytes: 1163 cells/uL — ABNORMAL HIGH (ref 200–950)
Basophils Absolute: 55 cells/uL (ref 0–200)
Basophils Relative: 0.2 %
Eosinophils Absolute: 0 cells/uL — ABNORMAL LOW (ref 15–500)
Eosinophils Relative: 0 %
HCT: 31.8 % — ABNORMAL LOW (ref 38.5–50.0)
Hemoglobin: 11.1 g/dL — ABNORMAL LOW (ref 13.2–17.1)
Lymphs Abs: 1607 cells/uL (ref 850–3900)
MCH: 35.1 pg — ABNORMAL HIGH (ref 27.0–33.0)
MCHC: 34.9 g/dL (ref 32.0–36.0)
MCV: 100.6 fL — ABNORMAL HIGH (ref 80.0–100.0)
MPV: 9.6 fL (ref 7.5–12.5)
Monocytes Relative: 4.2 %
Neutro Abs: 24875 cells/uL — ABNORMAL HIGH (ref 1500–7800)
Neutrophils Relative %: 89.8 %
Platelets: 251 10*3/uL (ref 140–400)
RBC: 3.16 10*6/uL — ABNORMAL LOW (ref 4.20–5.80)
RDW: 11.9 % (ref 11.0–15.0)
Total Lymphocyte: 5.8 %
WBC: 27.7 10*3/uL — ABNORMAL HIGH (ref 3.8–10.8)

## 2020-04-30 ENCOUNTER — Other Ambulatory Visit: Payer: Self-pay

## 2020-05-02 ENCOUNTER — Other Ambulatory Visit: Payer: Self-pay

## 2020-05-02 ENCOUNTER — Ambulatory Visit: Payer: Medicare PPO | Admitting: Family Medicine

## 2020-05-02 ENCOUNTER — Encounter: Payer: Self-pay | Admitting: Family Medicine

## 2020-05-02 VITALS — BP 130/68 | HR 62 | Temp 98.4°F | Resp 18 | Ht 73.0 in | Wt 190.0 lb

## 2020-05-02 DIAGNOSIS — E034 Atrophy of thyroid (acquired): Secondary | ICD-10-CM

## 2020-05-02 DIAGNOSIS — L03115 Cellulitis of right lower limb: Secondary | ICD-10-CM | POA: Diagnosis not present

## 2020-05-02 DIAGNOSIS — E7800 Pure hypercholesterolemia, unspecified: Secondary | ICD-10-CM

## 2020-05-02 DIAGNOSIS — I2510 Atherosclerotic heart disease of native coronary artery without angina pectoris: Secondary | ICD-10-CM | POA: Diagnosis not present

## 2020-05-02 DIAGNOSIS — I10 Essential (primary) hypertension: Secondary | ICD-10-CM

## 2020-05-02 LAB — CBC WITH DIFFERENTIAL/PLATELET
Absolute Monocytes: 968 cells/uL — ABNORMAL HIGH (ref 200–950)
Basophils Absolute: 66 cells/uL (ref 0–200)
Basophils Relative: 0.7 %
Eosinophils Absolute: 395 cells/uL (ref 15–500)
Eosinophils Relative: 4.2 %
HCT: 33.9 % — ABNORMAL LOW (ref 38.5–50.0)
Hemoglobin: 11.6 g/dL — ABNORMAL LOW (ref 13.2–17.1)
Lymphs Abs: 1908 cells/uL (ref 850–3900)
MCH: 34.9 pg — ABNORMAL HIGH (ref 27.0–33.0)
MCHC: 34.2 g/dL (ref 32.0–36.0)
MCV: 102.1 fL — ABNORMAL HIGH (ref 80.0–100.0)
MPV: 8.8 fL (ref 7.5–12.5)
Monocytes Relative: 10.3 %
Neutro Abs: 6063 cells/uL (ref 1500–7800)
Neutrophils Relative %: 64.5 %
Platelets: 321 10*3/uL (ref 140–400)
RBC: 3.32 10*6/uL — ABNORMAL LOW (ref 4.20–5.80)
RDW: 12.1 % (ref 11.0–15.0)
Total Lymphocyte: 20.3 %
WBC: 9.4 10*3/uL (ref 3.8–10.8)

## 2020-05-02 LAB — COMPLETE METABOLIC PANEL WITH GFR
AG Ratio: 1.2 (calc) (ref 1.0–2.5)
ALT: 6 U/L — ABNORMAL LOW (ref 9–46)
AST: 21 U/L (ref 10–35)
Albumin: 3.4 g/dL — ABNORMAL LOW (ref 3.6–5.1)
Alkaline phosphatase (APISO): 56 U/L (ref 35–144)
BUN/Creatinine Ratio: 23 (calc) — ABNORMAL HIGH (ref 6–22)
BUN: 30 mg/dL — ABNORMAL HIGH (ref 7–25)
CO2: 31 mmol/L (ref 20–32)
Calcium: 8.9 mg/dL (ref 8.6–10.3)
Chloride: 102 mmol/L (ref 98–110)
Creat: 1.29 mg/dL — ABNORMAL HIGH (ref 0.70–1.11)
GFR, Est African American: 59 mL/min/{1.73_m2} — ABNORMAL LOW (ref >=60)
GFR, Est Non African American: 51 mL/min/{1.73_m2} — ABNORMAL LOW (ref >=60)
Globulin: 2.8 g/dL (calc) (ref 1.9–3.7)
Glucose, Bld: 111 mg/dL — ABNORMAL HIGH (ref 65–99)
Potassium: 4 mmol/L (ref 3.5–5.3)
Sodium: 139 mmol/L (ref 135–146)
Total Bilirubin: 0.4 mg/dL (ref 0.2–1.2)
Total Protein: 6.2 g/dL (ref 6.1–8.1)

## 2020-05-02 MED ORDER — CEPHALEXIN 500 MG PO CAPS: 500.0000 mg | ORAL_CAPSULE | Freq: Three times a day (TID) | ORAL | 0 refills | Status: DC

## 2020-05-02 NOTE — Patient Instructions (Addendum)
Health Maintenance Due  Topic Date Due  . INFLUENZA VACCINE Declined in office flu shot today. 01/22/2020   blood pressure controlled. im fine with 80mg  lasix tomorrow if kidney function ok today. Then the next day can restart lisinopril as long as not lightheaded  Restart aspirin 81mg   Continue keflex for an additional 3 days- this time 3x a day. Will have 7 pills leftover if needed if recurrent issues and its the weekend or holiday. I still would love for you to keep me updated if you do have to take the medicine or take medicine and dont improve  Recommended follow up: No follow-ups on file.

## 2020-05-02 NOTE — Progress Notes (Signed)
Phone [phone] In person visit   Subjective:   Zachary Decker is a 86 y.o. year old very pleasant male patient who presents for/with See problem oriented charting Chief Complaint  Patient presents with  . Cellulitis   This visit occurred during the SARS-CoV-2 public health emergency.  Safety protocols were in place, including screening questions prior to the visit, additional usage of staff PPE, and extensive cleaning of exam room while observing appropriate contact time as indicated for disinfecting solutions.   Past Medical History-  Patient Active Problem List   Diagnosis Date Noted  . Duodenal ulcer     Priority: High  . MSSA bacteremia 04/25/2019    Priority: High  . Cellulitis of right leg 04/20/2019    Priority: High  . Parkinson's disease ([place]) 04/13/2018    Priority: High  . CAD w/ hx MI s/p CABG 09/07/2012    Priority: High  . Cardiomyopathy, ischemic 03/22/2010    Priority: High  . Atherosclerosis of aorta ([place]) 08/16/2019    Priority: Medium  . Low back pain 04/19/2015    Priority: Medium  . CKD (chronic kidney disease), stage III ([place]) 04/20/2014    Priority: Medium  . Anxiety state 04/17/2014    Priority: Medium  . HYPERCHOLESTEROLEMIA 10/31/2008    Priority: Medium  . BPH (benign prostatic hyperplasia) 09/28/2007    Priority: Medium  . Hypothyroidism 03/26/2007    Priority: Medium  . Essential hypertension 03/22/2007    Priority: Medium  . Senile purpura ([place]) 04/13/2018    Priority: Low  . ANEMIA, VITAMIN B12 DEFICIENCY 04/22/2010    Priority: Low  . Actinic keratosis 06/25/2009    Priority: Low  . BASAL CELL CARCINOMA, NOSE 02/05/2009    Priority: Low  . Localized osteoarthrosis, lower leg 12/06/2008    Priority: Low  . GERD 03/22/2007    Priority: Low  . Onychomycosis 12/20/2019  . Dehydration 04/20/2019  . Generalized weakness 04/20/2019  . Cellulitis of left foot   . Cellulitis of left leg 10/07/2018  . Insomnia 04/13/2018     Medications- reviewed and updated Current Outpatient Medications  Medication Sig Dispense Refill  . Acetaminophen (TYLENOL EXTRA STRENGTH PO) Take 1-2 tablets by mouth daily as needed (for back pain).    . Ascorbic Acid (VITAMIN C) 1000 MG tablet Take 1,000 mg by mouth daily.    Marland Kitchen aspirin EC 81 MG tablet Take 81 mg by mouth daily. Swallow whole.    . B Complex-C-Folic Acid (FOLBEE PLUS) TABS TAKE 1 TABLET BY MOUTH EVERY DAY 90 tablet 1  . carbidopa-levodopa (SINEMET CR) 50-200 MG tablet Take 1 tablet by mouth at bedtime. 90 tablet 1  . carbidopa-levodopa (SINEMET) 25-100 MG tablet Take 2 at 7am, 2 at 11am, 1 at 3pm, 1 at 7pm 540 tablet 0  . Cholecalciferol (VITAMIN D3) 125 MCG (5000 UT) TBDP Take 1 tablet by mouth daily.     . furosemide (LASIX) 40 MG tablet Take 1 tablet (40 mg total) by mouth daily. 90 tablet 3  . hydrOXYzine (ATARAX/VISTARIL) 10 MG tablet TAKE 1 TABLET BY MOUTH AT BEDTIME AS NEEDED 90 tablet 1  . levothyroxine (SYNTHROID) 50 MCG tablet TAKE 1 TABLET BY MOUTH DAILY BEFORE BREAKFAST 90 tablet 1  . nitroGLYCERIN (NITROSTAT) 0.4 MG SL tablet Place 1 tablet (0.4 mg total) under the tongue every 5 (five) minutes as needed for chest pain. 20 tablet 1  . pantoprazole (PROTONIX) 40 MG tablet TAKE 1 TABLET BY MOUTH TWICE A DAY 180  tablet 1  . simvastatin (ZOCOR) 20 MG tablet TAKE 1 TABLET BY MOUTH EVERYDAY AT BEDTIME 90 tablet 3  . tamsulosin (FLOMAX) 0.4 MG CAPS capsule TAKE 1 CAPSULE BY MOUTH EVERY DAY 90 capsule 3  . vitamin B-12 (CYANOCOBALAMIN) 500 MCG tablet Take 500 mcg by mouth daily.    Marland Kitchen zinc sulfate 220 (50 Zn) MG capsule Take 220 mg by mouth daily.    . cephALEXin (KEFLEX) 500 MG capsule Take 1 capsule (500 mg total) by mouth 3 (three) times daily for 10 days. 30 capsule 0  . lisinopril (ZESTRIL) 2.5 MG tablet TAKE 1 TABLET BY MOUTH EVERY DAY (Patient not taking: Reported on 05/02/2020) 90 tablet 3   No current facility-administered medications for this visit.      Objective:  BP 130/68   Pulse 62   Temp 98.4 F (36.9 C) (Temporal)   Resp 18   Ht 6\' 1"  (1.854 m)   Wt 190 lb (86.2 kg)   SpO2 98%   BMI 25.07 kg/m  Gen: NAD, resting comfortably CV: RRR no murmurs rubs or gallops Lungs: CTAB no crackles, wheeze, rhonchi Abdomen: soft/nontender/nondistended/normal bowel sounds.  Ext: no edema Skin: warm, dry    Assessment and Plan  # Right leg Cellulitis Concerns S: family noted right leg more swollen, tender, red particular over shin. He was more lethargic, less responsive. EMS called with plans to transport to [facility] but for some unclear reason they were not allowed to do so- fortunately he improved later that day after starting keflex 24 hours prior.   Patient's daughter mentioned that when they were here last and given a antibiotic (at that time it was for olecranon bursitis atypical presentation and they wanted to have antibiotics on hand in case worsened and then they would have been willing to see ortho- keflex 500 BID x 7 days)  they noticed a huge difference in his symptoms. Today is his last day of his antibiotic, he is eating more, going to the bathroom. The daughter would like to discuss possibly keeping the antibiotic and getting blood work done.   Noted huge improvement in symptoms within 24 hours of starting antibiotics.  A/P: right leg cellulitis- improving- will extend to 7 day course. im ok with them having 7 days extra at home in case recurrence not in the week   #hypertension S: medication:  lisinopril 2.5 mg (on hold for now) , lasix 40 mg daily  BP Readings from Last 3 Encounters:  05/02/20 130/68  04/12/20 122/74  02/16/20 (!) 107/58  A/P: blood pressure controlled. im fine with 80mg  lasix tomorrow if kidney function ok today. Then the next day can restart lisinopril as long as not lightheaded  #hypothyroidism S: compliant On thyroid medication-  Levothyroxine 50 mcg Lab Results  Component Value Date   TSH 4.10  02/07/2020   A/P:Stable. Continue current medications.   #CAD- asymptomatic #hyperlipidemia S: Medication:simvastatin 20 mg, agrees to restart aspirin 81 mg (prior held due to anemia) Lab Results  Component Value Date   CHOL 96 09/12/2019   HDL 36.20 (L) 09/12/2019   LDLCALC 49 09/12/2019   LDLDIRECT 60.0 04/08/2016   TRIG 51.0 09/12/2019   CHOLHDL 3 09/12/2019   A/P:  HLD at goal. CAD asymptomatic- anemia has resolved- start aspirin 81 mg  Recommended follow up: keep February visit Future Appointments  Date Time Provider [place]  05/24/2020  1:00 PM Tat, Eustace Quail, DO [facility] None  08/14/2020  1:20 PM [name],  MD [facility]  08/17/2020 12:00 PM Burnell Blanks, MD [facility]   Lab/Order associations:   ICD-10-CM   1. Cellulitis of right leg  L03.115 CBC With Differential/Platelet    COMPLETE METABOLIC PANEL WITH GFR  2. Essential hypertension  I10   3. HYPERCHOLESTEROLEMIA  E78.00   4. Coronary artery disease involving native coronary artery of native heart without angina pectoris  I25.10   5. Hypothyroidism due to acquired atrophy of thyroid  E03.4     Meds ordered this encounter  Medications  . cephALEXin (KEFLEX) 500 MG capsule    Sig: Take 1 capsule (500 mg total) by mouth 3 (three) times daily for 10 days.    Dispense:  30 capsule    Refill:  0   Time Spent: 41 minutes of total time (9:32 AM- 10:13 AM) was spent on the date of the encounter performing the following actions: chart review prior to seeing the patient, obtaining history, performing a medically necessary exam, counseling on the treatment plan, placing orders, and documenting in our EHR. Also discussion about covid 19 vaccination and alternative therapies (patient declines vaccination and flu vaccine this year- would use ivermectin and HCQ through front line doctors but would agree to outpatient antibody infusion if gets covid. We also discussed possibly testing  family members before they come over for thanksgiving since hes unvaccinated and high risk)  Return precautions advised.  Garret Reddish, MD

## 2020-05-14 ENCOUNTER — Other Ambulatory Visit: Payer: Self-pay | Admitting: Family Medicine

## 2020-05-21 NOTE — Progress Notes (Signed)
Assessment/Plan:   1.  Parkinsons Disease  -Continue carbidopa/levodopa 25/100, 2 tablets at 7 AM, 2 tablets at 11 AM, 1 tablet at 3 PM, 1 tablet at 7 PM  -Continue carbidopa/levodopa 50/200 at bedtime  2.  Eyelid opening apraxia  -Got better with the addition of levodopa.  Declined Botox.  3.  Low BP  -Spoke with Dr. Yong Channel.  He is going to monitor the patient's blood pressure.  Stated that cardiology felt he needed to be on the Zestril.  Is on a very low dose.  -discussed that his tamsulosin and lasix may be contributing along with Parkinsons Disease    Subjective:   Zachary Decker was seen today in follow up for Parkinsons disease.  My previous records were reviewed prior to todays visit as well as outside records available to me.  Pt with daughter who supplements hx.   His levodopa was increased last visit.  They report that he has been about the same. His transfers are better.   pt denies falls but only walks a little.  Walks twice per week when son comes.  Otherwise in a WC and wife helps to transfer to toilet.  Drooling is better.   Pt denies lightheadedness, near syncope but c/o weakness.  No hallucinations.  Mood has been good.  Medical records have been reviewed.  Has dealt with right leg cellulitis since last visit and olecranon bursitis.  Current prescribed movement disorder medications: Carbidopa/levodopa 25/100, 2 tablets at 7 AM, 2 tablets at 11 AM, 1 tablet at 3 PM, 1 tablet at 7 PM (increased last visit) Carbidopa/levodopa 50/200 at bedtime    ALLERGIES:   Allergies  Allergen Reactions  . Losartan Potassium Other (See Comments)    Not known  . Naproxen Nausea And Vomiting  . Penicillins Rash    Did it involve swelling of the face/tongue/throat, SOB, or low BP? No Did it involve sudden or severe rash/hives, skin peeling, or any reaction on the inside of your mouth or nose? Yes Did you need to seek medical attention at a hospital or doctor's office?  Unknown When did it last happen? If all above answers are "NO", may proceed with cephalosporin use.  TOLERATED CEFTRIAXONE 10/07/2018    CURRENT MEDICATIONS:  Outpatient Encounter Medications as of 05/24/2020  Medication Sig  . Acetaminophen (TYLENOL EXTRA STRENGTH PO) Take 1-2 tablets by mouth daily as needed (for back pain).  . Ascorbic Acid (VITAMIN C) 1000 MG tablet Take 1,000 mg by mouth daily.  Marland Kitchen aspirin EC 81 MG tablet Take 81 mg by mouth daily. Swallow whole.  . B Complex-C-Folic Acid (FOLBEE PLUS) TABS TAKE 1 TABLET BY MOUTH EVERY DAY  . carbidopa-levodopa (SINEMET CR) 50-200 MG tablet Take 1 tablet by mouth at bedtime.  . carbidopa-levodopa (SINEMET) 25-100 MG tablet Take 2 at 7am, 2 at 11am, 1 at 3pm, 1 at 7pm  . Cholecalciferol (VITAMIN D3) 125 MCG (5000 UT) TBDP Take 1 tablet by mouth daily.   . furosemide (LASIX) 40 MG tablet Take 1 tablet (40 mg total) by mouth daily.  Marland Kitchen levothyroxine (SYNTHROID) 50 MCG tablet TAKE 1 TABLET BY MOUTH DAILY BEFORE BREAKFAST  . lisinopril (ZESTRIL) 2.5 MG tablet TAKE 1 TABLET BY MOUTH EVERY DAY  . nitroGLYCERIN (NITROSTAT) 0.4 MG SL tablet Place 1 tablet (0.4 mg total) under the tongue every 5 (five) minutes as needed for chest pain.  . pantoprazole (PROTONIX) 40 MG tablet TAKE 1 TABLET BY MOUTH TWICE A DAY  .  simvastatin (ZOCOR) 20 MG tablet TAKE 1 TABLET BY MOUTH EVERYDAY AT BEDTIME  . tamsulosin (FLOMAX) 0.4 MG CAPS capsule TAKE 1 CAPSULE BY MOUTH EVERY DAY  . vitamin B-12 (CYANOCOBALAMIN) 500 MCG tablet Take 500 mcg by mouth daily.  Marland Kitchen zinc sulfate 220 (50 Zn) MG capsule Take 220 mg by mouth daily.  . [DISCONTINUED] carbidopa-levodopa (SINEMET CR) 50-200 MG tablet Take 1 tablet by mouth at bedtime.  . [DISCONTINUED] carbidopa-levodopa (SINEMET) 25-100 MG tablet Take 2 at 7am, 2 at 11am, 1 at 3pm, 1 at 7pm  . hydrOXYzine (ATARAX/VISTARIL) 10 MG tablet TAKE 1 TABLET BY MOUTH AT BEDTIME AS NEEDED (Patient not taking: Reported on 05/24/2020)    No facility-administered encounter medications on file as of 05/24/2020.    Objective:   PHYSICAL EXAMINATION:    VITALS:   Vitals:   05/24/20 1259 05/24/20 1315  BP: (!) 83/48 (!) 110/56  Pulse: (!) 56   SpO2: 94%   Weight: 184 lb (83.5 kg)   Height: 6\' 1"  (1.854 m)     GEN:  The patient appears stated age and is in NAD. HEENT:  Normocephalic, atraumatic.  The mucous membranes are moist. The superficial temporal arteries are without ropiness or tenderness. CV:  RRR Lungs:  CTAB Neck/HEME:  There are no carotid bruits bilaterally.  Neurological examination:  Orientation: The patient is alert and oriented x3. Cranial nerves: There is good facial symmetry with facial hypomimia. The speech is fluent and clear. Soft palate rises symmetrically and there is no tongue deviation. Hearing is intact to conversational tone. Sensation: Sensation is intact to light touch throughout Motor: Strength is at least antigravity x4.  Movement examination: Tone: There is mild increased tone in the RUE Abnormal movements: none Coordination:  There is mild decremation with RAM's Gait and Station: not tested today  I have reviewed and interpreted the following labs independently    Chemistry      Component Value Date/Time   NA 139 05/02/2020 1021   NA 141 11/17/2019 1124   K 4.0 05/02/2020 1021   CL 102 05/02/2020 1021   CO2 31 05/02/2020 1021   BUN 30 (H) 05/02/2020 1021   BUN 30 (H) 11/17/2019 1124   CREATININE 1.29 (H) 05/02/2020 1021   GLU 77 05/23/2019 0000      Component Value Date/Time   CALCIUM 8.9 05/02/2020 1021   ALKPHOS 65 12/21/2019 1105   AST 21 05/02/2020 1021   ALT 6 (L) 05/02/2020 1021   BILITOT 0.4 05/02/2020 1021       Lab Results  Component Value Date   WBC 9.4 05/02/2020   HGB 11.6 (L) 05/02/2020   HCT 33.9 (L) 05/02/2020   MCV 102.1 (H) 05/02/2020   PLT 321 05/02/2020    Lab Results  Component Value Date   TSH 4.10 02/07/2020     Total time  spent on today's visit was 30 minutes, including both face-to-face time and nonface-to-face time.  Time included that spent on review of records (prior notes available to me/labs/imaging if pertinent), discussing treatment and goals, answering patient's questions and coordinating care.  Cc:  [name], MD

## 2020-05-24 ENCOUNTER — Ambulatory Visit: Payer: Medicare PPO | Admitting: Neurology

## 2020-05-24 ENCOUNTER — Other Ambulatory Visit: Payer: Self-pay

## 2020-05-24 ENCOUNTER — Encounter: Payer: Self-pay | Admitting: Neurology

## 2020-05-24 VITALS — BP 110/56 | HR 56 | Ht 73.0 in | Wt 184.0 lb

## 2020-05-24 DIAGNOSIS — G20 Parkinson's disease: Secondary | ICD-10-CM | POA: Diagnosis not present

## 2020-05-24 DIAGNOSIS — R482 Apraxia: Secondary | ICD-10-CM

## 2020-05-24 MED ORDER — CARBIDOPA-LEVODOPA ER 50-200 MG PO TBCR
1.0000 | EXTENDED_RELEASE_TABLET | Freq: Every day | ORAL | 1 refills | Status: DC
Start: 2020-05-24 — End: 2020-11-15

## 2020-05-24 MED ORDER — CARBIDOPA-LEVODOPA 25-100 MG PO TABS: ORAL_TABLET | ORAL | 1 refills | Status: DC

## 2020-06-04 ENCOUNTER — Other Ambulatory Visit: Payer: Medicare PPO

## 2020-06-04 ENCOUNTER — Encounter: Payer: Self-pay | Admitting: Family Medicine

## 2020-06-04 ENCOUNTER — Other Ambulatory Visit: Payer: Self-pay

## 2020-06-04 DIAGNOSIS — L03115 Cellulitis of right lower limb: Secondary | ICD-10-CM

## 2020-06-05 LAB — CBC WITH DIFFERENTIAL/PLATELET
Absolute Monocytes: 1196 cells/uL — ABNORMAL HIGH (ref 200–950)
Basophils Absolute: 65 cells/uL (ref 0–200)
Basophils Relative: 0.5 %
Eosinophils Absolute: 572 cells/uL — ABNORMAL HIGH (ref 15–500)
Eosinophils Relative: 4.4 %
HCT: 30.9 % — ABNORMAL LOW (ref 38.5–50.0)
Hemoglobin: 10.7 g/dL — ABNORMAL LOW (ref 13.2–17.1)
Lymphs Abs: 2184 cells/uL (ref 850–3900)
MCH: 35 pg — ABNORMAL HIGH (ref 27.0–33.0)
MCHC: 34.6 g/dL (ref 32.0–36.0)
MCV: 101 fL — ABNORMAL HIGH (ref 80.0–100.0)
MPV: 9.3 fL (ref 7.5–12.5)
Monocytes Relative: 9.2 %
Neutro Abs: 8983 cells/uL — ABNORMAL HIGH (ref 1500–7800)
Neutrophils Relative %: 69.1 %
Platelets: 263 10*3/uL (ref 140–400)
RBC: 3.06 10*6/uL — ABNORMAL LOW (ref 4.20–5.80)
RDW: 12.2 % (ref 11.0–15.0)
Total Lymphocyte: 16.8 %
WBC: 13 10*3/uL — ABNORMAL HIGH (ref 3.8–10.8)

## 2020-06-07 ENCOUNTER — Other Ambulatory Visit: Payer: Self-pay

## 2020-06-07 DIAGNOSIS — L03115 Cellulitis of right lower limb: Secondary | ICD-10-CM

## 2020-06-08 ENCOUNTER — Other Ambulatory Visit: Payer: Medicare PPO

## 2020-06-08 DIAGNOSIS — L03115 Cellulitis of right lower limb: Secondary | ICD-10-CM

## 2020-06-09 LAB — COMPREHENSIVE METABOLIC PANEL
AG Ratio: 1.2 (calc) (ref 1.0–2.5)
ALT: 8 U/L — ABNORMAL LOW (ref 9–46)
AST: 29 U/L (ref 10–35)
Albumin: 3.6 g/dL (ref 3.6–5.1)
Alkaline phosphatase (APISO): 59 U/L (ref 35–144)
BUN/Creatinine Ratio: 25 (calc) — ABNORMAL HIGH (ref 6–22)
BUN: 39 mg/dL — ABNORMAL HIGH (ref 7–25)
CO2: 30 mmol/L (ref 20–32)
Calcium: 8.9 mg/dL (ref 8.6–10.3)
Chloride: 100 mmol/L (ref 98–110)
Creat: 1.59 mg/dL — ABNORMAL HIGH (ref 0.70–1.11)
Globulin: 2.9 g/dL (calc) (ref 1.9–3.7)
Glucose, Bld: 92 mg/dL (ref 65–99)
Potassium: 4.4 mmol/L (ref 3.5–5.3)
Sodium: 139 mmol/L (ref 135–146)
Total Bilirubin: 0.5 mg/dL (ref 0.2–1.2)
Total Protein: 6.5 g/dL (ref 6.1–8.1)

## 2020-06-09 LAB — CBC
HCT: 31.9 % — ABNORMAL LOW (ref 38.5–50.0)
Hemoglobin: 10.9 g/dL — ABNORMAL LOW (ref 13.2–17.1)
MCH: 34.6 pg — ABNORMAL HIGH (ref 27.0–33.0)
MCHC: 34.2 g/dL (ref 32.0–36.0)
MCV: 101.3 fL — ABNORMAL HIGH (ref 80.0–100.0)
MPV: 9.4 fL (ref 7.5–12.5)
Platelets: 272 10*3/uL (ref 140–400)
RBC: 3.15 10*6/uL — ABNORMAL LOW (ref 4.20–5.80)
RDW: 12.3 % (ref 11.0–15.0)
WBC: 8.6 10*3/uL (ref 3.8–10.8)

## 2020-06-11 ENCOUNTER — Ambulatory Visit: Payer: Medicare PPO | Admitting: Family Medicine

## 2020-06-11 ENCOUNTER — Encounter: Payer: Self-pay | Admitting: Family Medicine

## 2020-06-11 VITALS — BP 110/53 | HR 54 | Temp 97.9°F | Ht 73.0 in

## 2020-06-11 DIAGNOSIS — L0390 Cellulitis, unspecified: Secondary | ICD-10-CM

## 2020-06-11 DIAGNOSIS — B351 Tinea unguium: Secondary | ICD-10-CM

## 2020-06-11 MED ORDER — CEPHALEXIN 500 MG PO CAPS: 500.0000 mg | ORAL_CAPSULE | Freq: Three times a day (TID) | ORAL | 0 refills | Status: AC

## 2020-06-11 NOTE — Patient Instructions (Addendum)
We will call you within two weeks about your referral to podiatry. If you do not hear within 3 weeks, give [place] a call.   Sent keflex to have on hand but keep [place] updated if have to use. With how quickly he gets sick I think this is reasonable to have on hand to keep him out of the hospital   Recommended follow up: keep February visit

## 2020-06-11 NOTE — Progress Notes (Signed)
Phone [phone] In person visit   Subjective:   Zachary Decker is a 86 y.o. year old very pleasant male patient who presents for/with See problem oriented charting Chief Complaint  Patient presents with  . Cellulitis    Pt is here for a reevaluation   This visit occurred during the SARS-CoV-2 public health emergency.  Safety protocols were in place, including screening questions prior to the visit, additional usage of staff PPE, and extensive cleaning of exam room while observing appropriate contact time as indicated for disinfecting solutions.   Past Medical History-  Patient Active Problem List   Diagnosis Date Noted  . Duodenal ulcer     Priority: High  . MSSA bacteremia 04/25/2019    Priority: High  . Cellulitis of right leg 04/20/2019    Priority: High  . Parkinson's disease ([place]) 04/13/2018    Priority: High  . CAD w/ hx MI s/p CABG 09/07/2012    Priority: High  . Cardiomyopathy, ischemic 03/22/2010    Priority: High  . Atherosclerosis of aorta ([place]) 08/16/2019    Priority: Medium  . Low back pain 04/19/2015    Priority: Medium  . CKD (chronic kidney disease), stage III ([place]) 04/20/2014    Priority: Medium  . Anxiety state 04/17/2014    Priority: Medium  . HYPERCHOLESTEROLEMIA 10/31/2008    Priority: Medium  . BPH (benign prostatic hyperplasia) 09/28/2007    Priority: Medium  . Hypothyroidism 03/26/2007    Priority: Medium  . Essential hypertension 03/22/2007    Priority: Medium  . Senile purpura ([place]) 04/13/2018    Priority: Low  . ANEMIA, VITAMIN B12 DEFICIENCY 04/22/2010    Priority: Low  . Actinic keratosis 06/25/2009    Priority: Low  . BASAL CELL CARCINOMA, NOSE 02/05/2009    Priority: Low  . Localized osteoarthrosis, lower leg 12/06/2008    Priority: Low  . GERD 03/22/2007    Priority: Low  . Onychomycosis 12/20/2019  . Dehydration 04/20/2019  . Generalized weakness 04/20/2019  . Cellulitis of left foot   . Cellulitis of left leg  10/07/2018  . Insomnia 04/13/2018    Medications- reviewed and updated Current Outpatient Medications  Medication Sig Dispense Refill  . Acetaminophen (TYLENOL EXTRA STRENGTH PO) Take 1-2 tablets by mouth daily as needed (for back pain).    . Ascorbic Acid (VITAMIN C) 1000 MG tablet Take 1,000 mg by mouth daily.    Marland Kitchen aspirin EC 81 MG tablet Take 81 mg by mouth daily. Swallow whole.    . B Complex-C-Folic Acid (FOLBEE PLUS) TABS TAKE 1 TABLET BY MOUTH EVERY DAY 90 tablet 1  . carbidopa-levodopa (SINEMET CR) 50-200 MG tablet Take 1 tablet by mouth at bedtime. 90 tablet 1  . carbidopa-levodopa (SINEMET) 25-100 MG tablet Take 2 at 7am, 2 at 11am, 1 at 3pm, 1 at 7pm 540 tablet 1  . Cholecalciferol (VITAMIN D3) 125 MCG (5000 UT) TBDP Take 1 tablet by mouth daily.     . furosemide (LASIX) 40 MG tablet Take 1 tablet (40 mg total) by mouth daily. 90 tablet 3  . levothyroxine (SYNTHROID) 50 MCG tablet TAKE 1 TABLET BY MOUTH DAILY BEFORE BREAKFAST 90 tablet 1  . lisinopril (ZESTRIL) 2.5 MG tablet TAKE 1 TABLET BY MOUTH EVERY DAY 90 tablet 3  . nitroGLYCERIN (NITROSTAT) 0.4 MG SL tablet Place 1 tablet (0.4 mg total) under the tongue every 5 (five) minutes as needed for chest pain. 20 tablet 1  . pantoprazole (PROTONIX) 40 MG tablet TAKE 1  TABLET BY MOUTH TWICE A DAY 180 tablet 1  . simvastatin (ZOCOR) 20 MG tablet TAKE 1 TABLET BY MOUTH EVERYDAY AT BEDTIME 90 tablet 3  . tamsulosin (FLOMAX) 0.4 MG CAPS capsule TAKE 1 CAPSULE BY MOUTH EVERY DAY 90 capsule 3  . vitamin B-12 (CYANOCOBALAMIN) 500 MCG tablet Take 500 mcg by mouth daily.    Marland Kitchen zinc sulfate 220 (50 Zn) MG capsule Take 220 mg by mouth daily.    . cephALEXin (KEFLEX) 500 MG capsule Take 1 capsule (500 mg total) by mouth 3 (three) times daily for 10 days. 30 capsule 0  . hydrOXYzine (ATARAX/VISTARIL) 10 MG tablet TAKE 1 TABLET BY MOUTH AT BEDTIME AS NEEDED (Patient not taking: Reported on 06/11/2020) 90 tablet 1   No current facility-administered  medications for this visit.     Objective:  BP (!) 110/53   Pulse (!) 54   Temp 97.9 F (36.6 C) (Temporal)   Ht 6\' 1"  (1.854 m)   SpO2 94%   BMI 24.28 kg/m  Gen: NAD, resting comfortably CV: RRR no murmurs rubs or gallops Lungs: CTAB no crackles, wheeze, rhonchi Ext: 1+ edema bilaterally.  Thickened yellow toenails on left foot Skin: warm, dry Neuro: In his wheelchair today    Assessment and Plan   #Recurrent cellulitis #Onychomycosis S:  Patient with multiple episodes of cellulitis of the last year to 2 years.  Most recently, Issue started around December 11 on the left leg.  Atypical presentations often starting out with fatigue.  Patient son also noted worsening edema on his legs from Tuesday to Friday through December 11.  To coincide with fatigue.  He started Keflex which he had remaining at that time. Most recent episode responded well to keflex and has completely healed. Swelling is better-he has been using 80mg  lasix in morning since this started as he had increased swelling.   In regards to recurrent issues: 1.Has a hard time getting compression stockings on so has not been using (asks about Ace wraps and I think this would be reasonable) 2.Using dermend lotion- may switch to shea butter.  3.  Has thickened yellow toenails concerning for onychomycosis.  Treated with Diflucan for 12 weeks earlier this year without significant improvement.  When his nails are trimmed-Gets opening near toes and can sometime be trigger with cellulitis 4.  Minimal movement during the day-we discussed increasing this 5.  Discussed elevating legs A/P: 86 year old male with recurrent issues with cellulitis.  Thankfully most recent episode healed with course of Keflex 100 mg 3 times daily.  He also had increase his Lasix short-term to 80 mg-we discussed going back to 40 mg.  Discussed trying to increase movement to decrease swelling and trying to wear compression stockings and if this is not  tolerated at least doing Ace wraps.  With recurrent issues after trimming toenails-we also discussed referral to podiatry.  I did not have success in treating his onychomycosis and I would also like to have their perspective on this   Recommended follow up: Keep February visit Future Appointments  Date Time Provider [place]  08/14/2020  1:20 PM [name], MD [facility]  08/17/2020 12:00 PM Burnell Blanks, MD [facility]  11/15/2020  2:30 PM Tat, Eustace Quail, DO [facility] None   Lab/Order associations:   ICD-10-CM   1. Onychomycosis  B35.1 Ambulatory referral to Podiatry  2. Recurrent cellulitis  L03.90 Ambulatory referral to [place] ordered this encounter  Medications  .  cephALEXin (KEFLEX) 500 MG capsule    Sig: Take 1 capsule (500 mg total) by mouth 3 (three) times daily for 10 days.    Dispense:  30 capsule    Refill:  0   Return precautions advised.  Garret Reddish, MD

## 2020-06-26 ENCOUNTER — Other Ambulatory Visit: Payer: Self-pay

## 2020-06-26 ENCOUNTER — Ambulatory Visit: Payer: Medicare PPO | Admitting: Podiatry

## 2020-06-26 DIAGNOSIS — M79674 Pain in right toe(s): Secondary | ICD-10-CM

## 2020-06-26 DIAGNOSIS — B351 Tinea unguium: Secondary | ICD-10-CM | POA: Diagnosis not present

## 2020-06-26 DIAGNOSIS — M79675 Pain in left toe(s): Secondary | ICD-10-CM | POA: Diagnosis not present

## 2020-06-27 NOTE — Progress Notes (Signed)
Subjective:   Patient ID: Zachary Decker, male   DOB: 87 y.o.   MRN: [id number]   HPI 87 year old male presents the office today with his daughter for concerns of thick, elongated toenails that he cannot trim himself.  He is susceptible to getting recurrent cellulitis to bilateral legs and she would like to have the nails trimmed to help prevent any infections.   Review of Systems  All other systems reviewed and are negative.  Past Medical History:  Diagnosis Date  . Acute myocardial infarction of inferoposterior wall, subsequent episode of care (HCC)   . CAD (coronary artery disease)   . Encounter for long-term (current) use of other medications   . Family history of malignant neoplasm of gastrointestinal tract   . GERD (gastroesophageal reflux disease)   . Hypercholesterolemia   . Hypertension   . Hypertrophy of prostate with urinary obstruction and other lower urinary tract symptoms (LUTS)   . Personal history of thrombophlebitis   . PVD (peripheral vascular disease) (HCC)   . Respiratory failure (HCC)   . Unspecified adverse effect of unspecified drug, medicinal and biological substance   . Unspecified hypothyroidism     Past Surgical History:  Procedure Laterality Date  . BIOPSY  04/28/2019   Procedure: BIOPSY;  Surgeon: Tressia Danas, MD;  Location: [facility] ENDOSCOPY;  Service: Gastroenterology;;  . CATARACT EXTRACTION, BILATERAL    . CHOLECYSTECTOMY  02/26/2012   Procedure: LAPAROSCOPIC CHOLECYSTECTOMY WITH INTRAOPERATIVE CHOLANGIOGRAM;  Surgeon: Lodema Pilot, DO;  Location: [facility] OR;  Service: General;  Laterality: N/A;  . CORONARY ARTERY BYPASS GRAFT     x 5 on August 29, 2008  . ESOPHAGOGASTRODUODENOSCOPY (EGD) WITH PROPOFOL N/A 04/28/2019   Procedure: ESOPHAGOGASTRODUODENOSCOPY (EGD) WITH PROPOFOL;  Surgeon: Tressia Danas, MD;  Location: [facility] ENDOSCOPY;  Service: Gastroenterology;  Laterality: N/A;  . REPLACEMENT TOTAL KNEE Left   . tachecostomy placement     after heart  attack  . TONSILLECTOMY       Current Outpatient Medications:  .  Acetaminophen (TYLENOL EXTRA STRENGTH PO), Take 1-2 tablets by mouth daily as needed (for back pain)., Disp: , Rfl:  .  Ascorbic Acid (VITAMIN C) 1000 MG tablet, Take 1,000 mg by mouth daily., Disp: , Rfl:  .  aspirin EC 81 MG tablet, Take 81 mg by mouth daily. Swallow whole., Disp: , Rfl:  .  B Complex-C-Folic Acid (FOLBEE PLUS) TABS, TAKE 1 TABLET BY MOUTH EVERY DAY, Disp: 90 tablet, Rfl: 1 .  carbidopa-levodopa (SINEMET CR) 50-200 MG tablet, Take 1 tablet by mouth at bedtime., Disp: 90 tablet, Rfl: 1 .  carbidopa-levodopa (SINEMET) 25-100 MG tablet, Take 2 at 7am, 2 at 11am, 1 at 3pm, 1 at 7pm, Disp: 540 tablet, Rfl: 1 .  Cholecalciferol (VITAMIN D3) 125 MCG (5000 UT) TBDP, Take 1 tablet by mouth daily. , Disp: , Rfl:  .  furosemide (LASIX) 40 MG tablet, Take 1 tablet (40 mg total) by mouth daily., Disp: 90 tablet, Rfl: 3 .  hydrOXYzine (ATARAX/VISTARIL) 10 MG tablet, TAKE 1 TABLET BY MOUTH AT BEDTIME AS NEEDED (Patient not taking: Reported on 06/11/2020), Disp: 90 tablet, Rfl: 1 .  levothyroxine (SYNTHROID) 50 MCG tablet, TAKE 1 TABLET BY MOUTH DAILY BEFORE BREAKFAST, Disp: 90 tablet, Rfl: 1 .  lisinopril (ZESTRIL) 2.5 MG tablet, TAKE 1 TABLET BY MOUTH EVERY DAY, Disp: 90 tablet, Rfl: 3 .  nitroGLYCERIN (NITROSTAT) 0.4 MG SL tablet, Place 1 tablet (0.4 mg total) under the tongue every 5 (five) minutes as needed for  chest pain., Disp: 20 tablet, Rfl: 1 .  pantoprazole (PROTONIX) 40 MG tablet, TAKE 1 TABLET BY MOUTH TWICE A DAY, Disp: 180 tablet, Rfl: 1 .  simvastatin (ZOCOR) 20 MG tablet, TAKE 1 TABLET BY MOUTH EVERYDAY AT BEDTIME, Disp: 90 tablet, Rfl: 3 .  tamsulosin (FLOMAX) 0.4 MG CAPS capsule, TAKE 1 CAPSULE BY MOUTH EVERY DAY, Disp: 90 capsule, Rfl: 3 .  vitamin B-12 (CYANOCOBALAMIN) 500 MCG tablet, Take 500 mcg by mouth daily., Disp: , Rfl:  .  zinc sulfate 220 (50 Zn) MG capsule, Take 220 mg by mouth daily., Disp: ,  Rfl:   Allergies  Allergen Reactions  . Losartan Potassium Other (See Comments)    Not known  . Naproxen Nausea And Vomiting  . Penicillins Rash    Did it involve swelling of the face/tongue/throat, SOB, or low BP? No Did it involve sudden or severe rash/hives, skin peeling, or any reaction on the inside of your mouth or nose? Yes Did you need to seek medical attention at a hospital or doctor's office? Unknown When did it last happen? If all above answers are "NO", may proceed with cephalosporin use.  TOLERATED CEFTRIAXONE 10/07/2018          Objective:  Physical Exam  General: AAO x3, NAD  Dermatological: Nails are hypertrophic, dystrophic, brittle, discolored, elongated 10. No surrounding redness or drainage. Tenderness nails 1-5 bilaterally. No open lesions or pre-ulcerative lesions are identified today.  Vascular: Dorsalis Pedis artery and Posterior Tibial artery pedal pulses are 1/4 bilateral with immedate capillary fill time.  Chronic edema present bilateral legs.  There is no pain with calf compression.  Neruologic: Sensation decreased with Semmes Weinstein monofilament  Musculoskeletal: No gross boney pedal deformities bilateral. No pain, crepitus, or limitation noted with foot and ankle range of motion bilateral. Muscular strength 4/5 in all groups tested bilateral.     Assessment:   Symptomatic onychomycosis with history of bilateral lower extremity cellulitis     Plan:  -Treatment options discussed including all alternatives, risks, and complications -Etiology of symptoms were discussed -Nails debrided 10 without complications or bleeding. -Daily foot inspection -Follow-up in 3 months or sooner if any problems arise. In the meantime, encouraged to call the office with any questions, concerns, change in symptoms.   Celesta Gentile, DPM

## 2020-07-26 ENCOUNTER — Ambulatory Visit (INDEPENDENT_AMBULATORY_CARE_PROVIDER_SITE_OTHER): Payer: Medicare PPO

## 2020-07-26 DIAGNOSIS — Z0000 Encounter for general adult medical examination without abnormal findings: Secondary | ICD-10-CM | POA: Diagnosis not present

## 2020-07-26 NOTE — Progress Notes (Signed)
Virtual Visit via Telephone Note  I connected with  Zachary Decker on 07/26/20 at 11:45 AM EST by telephone and verified that I am speaking with the correct person using two identifiers.  Medicare Annual Wellness visit completed telephonically due to Covid-19 pandemic.   Persons participating in this call: This Health Coach and this patient.   Location: Patient: home Provider: office   I discussed the limitations, risks, security and privacy concerns of performing an evaluation and management service by telephone and the availability of in person appointments. The patient expressed understanding and agreed to proceed.  Unable to perform video visit due to video visit attempted and failed and/or patient does not have video capability.   Some vital signs may be absent or patient reported.   Zachary Brace, LPN    Subjective:   Zachary Decker is a 87 y.o. male who presents for Medicare Annual/Subsequent preventive examination.  Review of Systems     Cardiac Risk Factors include: advanced age (>40men, >84 women);dyslipidemia;male gender;hypertension     Objective:    There were no vitals filed for this visit. There is no height or weight on file to calculate BMI.  Advanced Directives 07/26/2020 05/24/2020 02/16/2020 11/17/2019 08/31/2019 06/07/2019 04/28/2019  Does Patient Have a Medical Advance Directive? Yes Yes Yes No Yes No No  Type of Paramedic of [place];Living will [place];Living will [place];Living will - Living will;Healthcare Power of Attorney - -  Copy of [place] in Chart? No - copy requested - - - - - -  Would patient like information on creating a medical advance directive? - - - - - - No - Patient declined  Pre-existing out of facility DNR order (yellow form or pink MOST form) - - - - - - -    Current Medications (verified) Outpatient Encounter Medications as of 07/26/2020   Medication Sig  . Acetaminophen (TYLENOL EXTRA STRENGTH PO) Take 1-2 tablets by mouth daily as needed (for back pain).  . Ascorbic Acid (VITAMIN C) 1000 MG tablet Take 1,000 mg by mouth daily.  Marland Kitchen aspirin EC 81 MG tablet Take 81 mg by mouth daily. Swallow whole.  . B Complex-C-Folic Acid (FOLBEE PLUS) TABS TAKE 1 TABLET BY MOUTH EVERY DAY  . carbidopa-levodopa (SINEMET CR) 50-200 MG tablet Take 1 tablet by mouth at bedtime.  . carbidopa-levodopa (SINEMET) 25-100 MG tablet Take 2 at 7am, 2 at 11am, 1 at 3pm, 1 at 7pm  . Cholecalciferol (VITAMIN D3) 125 MCG (5000 UT) TBDP Take 1 tablet by mouth daily.   . furosemide (LASIX) 40 MG tablet Take 1 tablet (40 mg total) by mouth daily.  Marland Kitchen levothyroxine (SYNTHROID) 50 MCG tablet TAKE 1 TABLET BY MOUTH DAILY BEFORE BREAKFAST  . lisinopril (ZESTRIL) 2.5 MG tablet TAKE 1 TABLET BY MOUTH EVERY DAY  . nitroGLYCERIN (NITROSTAT) 0.4 MG SL tablet Place 1 tablet (0.4 mg total) under the tongue every 5 (five) minutes as needed for chest pain.  . pantoprazole (PROTONIX) 40 MG tablet TAKE 1 TABLET BY MOUTH TWICE A DAY  . simvastatin (ZOCOR) 20 MG tablet TAKE 1 TABLET BY MOUTH EVERYDAY AT BEDTIME  . tamsulosin (FLOMAX) 0.4 MG CAPS capsule TAKE 1 CAPSULE BY MOUTH EVERY DAY  . vitamin B-12 (CYANOCOBALAMIN) 500 MCG tablet Take 500 mcg by mouth daily.  Marland Kitchen zinc sulfate 220 (50 Zn) MG capsule Take 220 mg by mouth daily.  . hydrOXYzine (ATARAX/VISTARIL) 10 MG tablet TAKE  1 TABLET BY MOUTH AT BEDTIME AS NEEDED (Patient not taking: No sig reported)   No facility-administered encounter medications on file as of 07/26/2020.    Allergies (verified) Losartan potassium, Naproxen, and Penicillins   History: Past Medical History:  Diagnosis Date  . Acute myocardial infarction of inferoposterior wall, subsequent episode of care ([place])   . CAD (coronary artery disease)   . Encounter for long-term (current) use of other medications   . Family history of malignant neoplasm of  gastrointestinal tract   . GERD (gastroesophageal reflux disease)   . Hypercholesterolemia   . Hypertension   . Hypertrophy of prostate with urinary obstruction and other lower urinary tract symptoms (LUTS)   . Personal history of thrombophlebitis   . PVD (peripheral vascular disease) ([place])   . Respiratory failure ([place])   . Unspecified adverse effect of unspecified drug, medicinal and biological substance   . Unspecified hypothyroidism    Past Surgical History:  Procedure Laterality Date  . BIOPSY  04/28/2019   Procedure: BIOPSY;  Surgeon: Thornton Park, MD;  Location: [facility];  Service: Gastroenterology;;  . CATARACT EXTRACTION, BILATERAL    . CHOLECYSTECTOMY  02/26/2012   Procedure: LAPAROSCOPIC CHOLECYSTECTOMY WITH INTRAOPERATIVE CHOLANGIOGRAM;  Surgeon: Madilyn Hook, DO;  Location: [facility];  Service: General;  Laterality: N/A;  . CORONARY ARTERY BYPASS GRAFT     x 5 on August 29, 2008  . ESOPHAGOGASTRODUODENOSCOPY (EGD) WITH PROPOFOL N/A 04/28/2019   Procedure: ESOPHAGOGASTRODUODENOSCOPY (EGD) WITH PROPOFOL;  Surgeon: Thornton Park, MD;  Location: [facility];  Service: Gastroenterology;  Laterality: N/A;  . REPLACEMENT TOTAL KNEE Left   . tachecostomy placement     after heart attack  . TONSILLECTOMY     Family History  Problem Relation Age of Onset  . Heart disease Mother   . Tuberculosis Father   . Zachary cancer Brother    Social History   Socioeconomic History  . Marital status: Married    Spouse name: Apolonio Schneiders  . Number of children: 2  . Years of education: Not on file  . Highest education level: Master's degree (e.g., MA, MS, MEng, MEd, MSW, MBA)  Occupational History    Comment: Retired Careers adviser   Tobacco Use  . Smoking status: Never Smoker  . Smokeless tobacco: Never Used  Vaping Use  . Vaping Use: Never used  Substance and Sexual Activity  . Alcohol use: No  . Drug use: No  . Sexual activity: Not on file  Other Topics Concern  . Not on file   Social History Narrative   Married 1958. 2 kids (boy, girl). 2 grandkids (boy and girl).       Retired from [employer] ([employer] in [place])      [place]: former Air cabin crew, tv, reading      Right handed      Lives in a single story home with wife and Daughter Linus Orn.   Social Determinants of Health   Financial Resource Strain: Low Risk   . Difficulty of Paying Living Expenses: Not hard at all  Food Insecurity: No Food Insecurity  . Worried About Charity fundraiser in the Last Year: Never true  . Ran Out of Food in the Last Year: Never true  Transportation Needs: No Transportation Needs  . Lack of Transportation (Medical): No  . Lack of Transportation (Non-Medical): No  Physical Activity: Sufficiently Active  . Days of Exercise per Week: 7 days  . Minutes of Exercise per Session: 30 min  Stress: No  Stress Concern Present  . Feeling of Stress : Not at all  Social Connections: Moderately Integrated  . Frequency of Communication with Friends and Family: More than three times a week  . Frequency of Social Gatherings with Friends and Family: Never  . Attends Religious Services: More than 4 times per year  . Active Member of Clubs or Organizations: No  . Attends Archivist Meetings: Never  . Marital Status: Married    Tobacco Counseling Counseling given: Not Answered   Clinical Intake:  Pre-visit preparation completed: Yes  Pain : No/denies pain     BMI - recorded: 24.28 Nutritional Status: BMI of 19-24  Normal Nutritional Risks: None Diabetes: No  How often do you need to have someone help you when you read instructions, pamphlets, or other written materials from your doctor or pharmacy?: 1 - Never  Diabetic?No  Interpreter Needed?: No  Information entered by :: Charlott Rakes, LPN   Activities of Daily Living In your present state of health, do you have any difficulty performing the following activities: 07/26/2020  Hearing? N  Vision? N   Difficulty concentrating or making decisions? N  Walking or climbing stairs? N  Dressing or bathing? N  Doing errands, shopping? N  Preparing Food and eating ? N  Comment wife does this  Using the Toilet? N  In the past six months, have you accidently leaked urine? N  Do you have problems with loss of bowel control? N  Managing your Medications? N  Managing your Finances? N  Housekeeping or managing your Housekeeping? N  Some recent data might be hidden    Patient Care Team: [name], MD as PCP - General (Family Medicine) Burnell Blanks, MD as PCP - Cardiology (Cardiology) Eliott, Jeralyn Ruths as Consulting Physician (Optometry) leondard, nelson as Consulting Physician (Dentistry) Tat, Eustace Quail, DO as Consulting Physician (Neurology)  Indicate any recent Medical Services you may have received from other than [facility] providers in the past year (date may be approximate).     Assessment:   This is a routine wellness examination for [facility].  Hearing/Vision screen  Hearing Screening   125Hz  250Hz  500Hz  1000Hz  2000Hz  3000Hz  4000Hz  6000Hz  8000Hz   Right ear:           Left ear:           Comments: Pt denies any hearing issues   Vision Screening Comments: Pt has not been since pandemic   Dietary issues and exercise activities discussed: Current Exercise Habits: Home exercise routine, Type of exercise: Other - see comments (leg lifts arms and stand and sit exercises), Time (Minutes): 30, Frequency (Times/Week): 7, Weekly Exercise (Minutes/Week): 210  Goals    . Maintain current health status    . Patient Stated    . Patient Stated     Walk better with walker      Depression Screen PHQ 2/9 Scores 07/26/2020 06/11/2020 12/20/2019 11/17/2019 10/28/2019 08/16/2019 05/06/2019  PHQ - 2 Score 0 0 0 0 0 0 0  PHQ- 9 Score - - 1 - 0 1 4    Fall Risk Fall Risk  07/26/2020 05/24/2020 02/16/2020 11/17/2019 08/31/2019  Falls in the past year? 0 0 0 0 0  Comment - - - - -  Number falls  in past yr: 0 0 0 0 0  Injury with Fall? 0 0 0 0 0  Risk for fall due to : Impaired vision;Impaired mobility;Impaired balance/gait - - - -  Follow up Falls prevention  discussed - - - -    FALL RISK PREVENTION PERTAINING TO THE HOME:  Any stairs in or around the home? No  If so, are there any without handrails? No  Home free of loose throw rugs in walkways, pet beds, electrical cords, etc? Yes  Adequate lighting in your home to reduce risk of falls? Yes   ASSISTIVE DEVICES UTILIZED TO PREVENT FALLS:  Life alert? No  Use of a cane, walker or w/c? Yes  Grab bars in the bathroom? Yes  Shower chair or bench in shower? Yes  Elevated toilet seat or a handicapped toilet? Yes   TIMED UP AND GO:  Was the test performed? No .      Cognitive Function:   Montreal Cognitive Assessment  06/06/2019  Attention: Read list of digits (0/2) 1  Attention: Read list of letters (0/1) 1  Attention: Serial 7 subtraction starting at 100 (0/3) 3  Language: Repeat phrase (0/2) 2  Language : Fluency (0/1) 0  Abstraction (0/2) 0  Delayed Recall (0/5) 2  Orientation (0/6) 4   6CIT Screen 07/26/2020  What Year? 0 points  What month? 0 points  Count back from 20 0 points  Months in reverse 4 points  Repeat phrase 0 points    Immunizations Immunization History  Administered Date(s) Administered  . Fluad Quad(high Dose 65+) 04/23/2019  . Influenza Split 03/28/2011, 03/25/2012  . Influenza Whole 06/23/2001, 03/26/2007, 03/24/2008, 04/04/2009, 03/13/2010  . Influenza, High Dose Seasonal PF 04/08/2016, 04/09/2017, 04/13/2018  . Influenza,inj,Quad PF,6+ Mos 03/15/2013, 03/13/2014, 03/21/2015  . Pneumococcal Conjugate-13 10/17/2014  . Pneumococcal Polysaccharide-23 06/23/2005  . Td 06/23/2005    TDAP status: Due, Education has been provided regarding the importance of this vaccine. Advised may receive this vaccine at local pharmacy or Health Dept. Aware to provide a copy of the vaccination record  if obtained from local pharmacy or Health Dept. Verbalized acceptance and understanding.  Flu Vaccine status: Due, Education has been provided regarding the importance of this vaccine. Advised may receive this vaccine at local pharmacy or Health Dept. Aware to provide a copy of the vaccination record if obtained from local pharmacy or Health Dept. Verbalized acceptance and understanding.  Pneumococcal vaccine status: Up to date  Covid-19 vaccine status: Declined, Education has been provided regarding the importance of this vaccine but patient still declined. Advised may receive this vaccine at local pharmacy or Health Dept.or vaccine clinic. Aware to provide a copy of the vaccination record if obtained from local pharmacy or Health Dept. Verbalized acceptance and understanding.  Qualifies for Shingles Vaccine? Yes   Zostavax completed No   Shingrix Completed?: No.    Education has been provided regarding the importance of this vaccine. Patient has been advised to call insurance company to determine out of pocket expense if they have not yet received this vaccine. Advised may also receive vaccine at local pharmacy or Health Dept. Verbalized acceptance and understanding.  Screening Tests Health Maintenance  Topic Date Due  . TETANUS/TDAP  08/15/2020 (Originally 06/24/2015)  . INFLUENZA VACCINE  09/20/2020 (Originally 01/22/2020)  . PNA vac Low Risk Adult  Completed  . COVID-19 Vaccine  Discontinued    Health Maintenance  There are no preventive care reminders to display for this patient.  Colorectal cancer screening: No longer required.    Additional Screening:  Vision Screening: Recommended annual ophthalmology exams for early detection of glaucoma and other disorders of the eye. Is the patient up to date with their annual eye exam?  No   If pt is not established with a provider, would they like to be referred to a provider to establish care? No .   Dental Screening: Recommended annual  dental exams for proper oral hygiene  Community Resource Referral / Chronic Care Management: CRR required this visit?  No   CCM required this visit?  No      Plan:     I have personally reviewed and noted the following in the patient's chart:   . Medical and social history . Use of alcohol, tobacco or illicit drugs  . Current medications and supplements . Functional ability and status . Nutritional status . Physical activity . Advanced directives . List of other physicians . Hospitalizations, surgeries, and ER visits in previous 12 months . Vitals . Screenings to include cognitive, depression, and falls . Referrals and appointments  In addition, I have reviewed and discussed with patient certain preventive protocols, quality metrics, and best practice recommendations. A written personalized care plan for preventive services as well as general preventive health recommendations were provided to patient.     Zachary Brace, LPN   11/22/8313   Nurse Notes: None

## 2020-07-26 NOTE — Patient Instructions (Addendum)
Zachary Decker , Thank you for taking time to come for your Medicare Wellness Visit. I appreciate your ongoing commitment to your health goals. Please review the following plan we discussed and let me know if I can assist you in the future.   Screening recommendations/referrals: Colonoscopy: No longer required Recommended yearly ophthalmology/optometry visit for glaucoma screening and checkup Recommended yearly dental visit for hygiene and checkup  Vaccinations: Influenza vaccine: Declined and discussed Pneumococcal vaccine: Up to date Tdap vaccine: Declined and discussed Shingles vaccine: Shingrix discussed. Please contact your pharmacy for coverage information.    Covid-19: Declined and discussed  Advanced directives: Please bring a copy of your health care power of attorney and living will to the office at your convenience.  Conditions/risks identified: Walk better with walker  Next appointment: Follow up in one year for your annual wellness visit.   Preventive Care 85 Years and Older, Male Preventive care refers to lifestyle choices and visits with your health care provider that can promote health and wellness. What does preventive care include?  A yearly physical exam. This is also called an annual well check.  Dental exams once or twice a year.  Routine eye exams. Ask your health care provider how often you should have your eyes checked.  Personal lifestyle choices, including:  Daily care of your teeth and gums.  Regular physical activity.  Eating a healthy diet.  Avoiding tobacco and drug use.  Limiting alcohol use.  Practicing safe sex.  Taking low doses of aspirin every day.  Taking vitamin and mineral supplements as recommended by your health care provider. What happens during an annual well check? The services and screenings done by your health care provider during your annual well check will depend on your age, overall health, lifestyle risk factors, and  family history of disease. Counseling  Your health care provider may ask you questions about your:  Alcohol use.  Tobacco use.  Drug use.  Emotional well-being.  Home and relationship well-being.  Sexual activity.  Eating habits.  History of falls.  Memory and ability to understand (cognition).  Work and work Statistician. Screening  You may have the following tests or measurements:  Height, weight, and BMI.  Blood pressure.  Lipid and cholesterol levels. These may be checked every 5 years, or more frequently if you are over 85 years old.  Skin check.  Lung cancer screening. You may have this screening every year starting at age 36 if you have a 30-pack-year history of smoking and currently smoke or have quit within the past 15 years.  Fecal occult blood test (FOBT) of the stool. You may have this test every year starting at age 12.  Flexible sigmoidoscopy or colonoscopy. You may have a sigmoidoscopy every 5 years or a colonoscopy every 10 years starting at age 85.  Prostate cancer screening. Recommendations will vary depending on your family history and other risks.  Hepatitis C blood test.  Hepatitis B blood test.  Sexually transmitted disease (STD) testing.  Diabetes screening. This is done by checking your blood sugar (glucose) after you have not eaten for a while (fasting). You may have this done every 1-3 years.  Abdominal aortic aneurysm (AAA) screening. You may need this if you are a current or former smoker.  Osteoporosis. You may be screened starting at age 23 if you are at high risk. Talk with your health care provider about your test results, treatment options, and if necessary, the need for more tests. Vaccines  Your  health care provider may recommend certain vaccines, such as:  Influenza vaccine. This is recommended every year.  Tetanus, diphtheria, and acellular pertussis (Tdap, Td) vaccine. You may need a Td booster every 10 years.  Zoster  vaccine. You may need this after age 45.  Pneumococcal 13-valent conjugate (PCV13) vaccine. One dose is recommended after age 23.  Pneumococcal polysaccharide (PPSV23) vaccine. One dose is recommended after age 37. Talk to your health care provider about which screenings and vaccines you need and how often you need them. This information is not intended to replace advice given to you by your health care provider. Make sure you discuss any questions you have with your health care provider. Document Released: 07/06/2015 Document Revised: 02/27/2016 Document Reviewed: 04/10/2015 Elsevier Interactive Patient Education  2017 [place] Prevention in the Home Falls can cause injuries. They can happen to people of all ages. There are many things you can do to make your home safe and to help prevent falls. What can I do on the outside of my home?  Regularly fix the edges of walkways and driveways and fix any cracks.  Remove anything that might make you trip as you walk through a door, such as a raised step or threshold.  Trim any bushes or trees on the path to your home.  Use bright outdoor lighting.  Clear any walking paths of anything that might make someone trip, such as rocks or tools.  Regularly check to see if handrails are loose or broken. Make sure that both sides of any steps have handrails.  Any raised decks and porches should have guardrails on the edges.  Have any leaves, snow, or ice cleared regularly.  Use sand or salt on walking paths during winter.  Clean up any spills in your garage right away. This includes oil or grease spills. What can I do in the bathroom?  Use night lights.  Install grab bars by the toilet and in the tub and shower. Do not use towel bars as grab bars.  Use non-skid mats or decals in the tub or shower.  If you need to sit down in the shower, use a plastic, non-slip stool.  Keep the floor dry. Clean up any water that spills on the  floor as soon as it happens.  Remove soap buildup in the tub or shower regularly.  Attach bath mats securely with double-sided non-slip rug tape.  Do not have throw rugs and other things on the floor that can make you trip. What can I do in the bedroom?  Use night lights.  Make sure that you have a light by your bed that is easy to reach.  Do not use any sheets or blankets that are too big for your bed. They should not hang down onto the floor.  Have a firm chair that has side arms. You can use this for support while you get dressed.  Do not have throw rugs and other things on the floor that can make you trip. What can I do in the kitchen?  Clean up any spills right away.  Avoid walking on wet floors.  Keep items that you use a lot in easy-to-reach places.  If you need to reach something above you, use a strong step stool that has a grab bar.  Keep electrical cords out of the way.  Do not use floor polish or wax that makes floors slippery. If you must use wax, use non-skid floor wax.  Do not  have throw rugs and other things on the floor that can make you trip. What can I do with my stairs?  Do not leave any items on the stairs.  Make sure that there are handrails on both sides of the stairs and use them. Fix handrails that are broken or loose. Make sure that handrails are as long as the stairways.  Check any carpeting to make sure that it is firmly attached to the stairs. Fix any carpet that is loose or worn.  Avoid having throw rugs at the top or bottom of the stairs. If you do have throw rugs, attach them to the floor with carpet tape.  Make sure that you have a light switch at the top of the stairs and the bottom of the stairs. If you do not have them, ask someone to add them for you. What else can I do to help prevent falls?  Wear shoes that:  Do not have high heels.  Have rubber bottoms.  Are comfortable and fit you well.  Are closed at the toe. Do not wear  sandals.  If you use a stepladder:  Make sure that it is fully opened. Do not climb a closed stepladder.  Make sure that both sides of the stepladder are locked into place.  Ask someone to hold it for you, if possible.  Clearly mark and make sure that you can see:  Any grab bars or handrails.  First and last steps.  Where the edge of each step is.  Use tools that help you move around (mobility aids) if they are needed. These include:  Canes.  Walkers.  Scooters.  Crutches.  Turn on the lights when you go into a dark area. Replace any light bulbs as soon as they burn out.  Set up your furniture so you have a clear path. Avoid moving your furniture around.  If any of your floors are uneven, fix them.  If there are any pets around you, be aware of where they are.  Review your medicines with your doctor. Some medicines can make you feel dizzy. This can increase your chance of falling. Ask your doctor what other things that you can do to help prevent falls. This information is not intended to replace advice given to you by your health care provider. Make sure you discuss any questions you have with your health care provider. Document Released: 04/05/2009 Document Revised: 11/15/2015 Document Reviewed: 07/14/2014 Elsevier Interactive Patient Education  2017 [employer].

## 2020-08-03 ENCOUNTER — Other Ambulatory Visit: Payer: Self-pay | Admitting: Family Medicine

## 2020-08-13 NOTE — Progress Notes (Signed)
Phone [phone] In person visit   Subjective:   Zachary Decker is a 87 y.o. year old very pleasant male patient who presents for/with See problem oriented charting Chief Complaint  Patient presents with   Hypertension   Hypothyroidism   Hyperlipidemia   Anxiety   Gastroesophageal Reflux   This visit occurred during the SARS-CoV-2 public health emergency.  Safety protocols were in place, including screening questions prior to the visit, additional usage of staff PPE, and extensive cleaning of exam room while observing appropriate contact time as indicated for disinfecting solutions.   Past Medical History-  Patient Active Problem List   Diagnosis Date Noted   Duodenal ulcer     Priority: High   MSSA bacteremia 04/25/2019    Priority: High   Cellulitis of right leg 04/20/2019    Priority: High   Parkinson's disease ([place]) 04/13/2018    Priority: High   CAD w/ hx MI s/p CABG 09/07/2012    Priority: High   Cardiomyopathy, ischemic 03/22/2010    Priority: High   Atherosclerosis of aorta ([place]) 08/16/2019    Priority: Medium   Low back pain 04/19/2015    Priority: Medium   CKD (chronic kidney disease), stage III ([place]) 04/20/2014    Priority: Medium   Anxiety state 04/17/2014    Priority: Medium   HYPERCHOLESTEROLEMIA 10/31/2008    Priority: Medium   BPH (benign prostatic hyperplasia) 09/28/2007    Priority: Medium   Hypothyroidism 03/26/2007    Priority: Medium   Essential hypertension 03/22/2007    Priority: Medium   Senile purpura ([place]) 04/13/2018    Priority: Low   ANEMIA, VITAMIN B12 DEFICIENCY 04/22/2010    Priority: Low   Actinic keratosis 06/25/2009    Priority: Low   BASAL CELL CARCINOMA, NOSE 02/05/2009    Priority: Low   Localized osteoarthrosis, lower leg 12/06/2008    Priority: Low   GERD 03/22/2007    Priority: Low   Onychomycosis 12/20/2019   Dehydration 04/20/2019   Generalized weakness 04/20/2019   Cellulitis of  left foot    Cellulitis of left leg 10/07/2018   Insomnia 04/13/2018    Medications- reviewed and updated Current Outpatient Medications  Medication Sig Dispense Refill   Acetaminophen (TYLENOL EXTRA STRENGTH PO) Take 1-2 tablets by mouth daily as needed (for back pain).     Ascorbic Acid (VITAMIN C) 1000 MG tablet Take 1,000 mg by mouth daily.     aspirin EC 81 MG tablet Take 81 mg by mouth daily. Swallow whole.     B Complex-C-Folic Acid (FOLBEE PLUS) TABS TAKE 1 TABLET BY MOUTH EVERY DAY 90 tablet 1   carbidopa-levodopa (SINEMET CR) 50-200 MG tablet Take 1 tablet by mouth at bedtime. 90 tablet 1   carbidopa-levodopa (SINEMET) 25-100 MG tablet Take 2 at 7am, 2 at 11am, 1 at 3pm, 1 at 7pm 540 tablet 1   Cholecalciferol (VITAMIN D3) 125 MCG (5000 UT) TBDP Take 1 tablet by mouth daily.      furosemide (LASIX) 40 MG tablet Take 1 tablet (40 mg total) by mouth daily. 90 tablet 3   levothyroxine (SYNTHROID) 50 MCG tablet TAKE 1 TABLET BY MOUTH DAILY BEFORE BREAKFAST 90 tablet 1   lisinopril (ZESTRIL) 2.5 MG tablet TAKE 1 TABLET BY MOUTH EVERY DAY 90 tablet 3   nitroGLYCERIN (NITROSTAT) 0.4 MG SL tablet Place 1 tablet (0.4 mg total) under the tongue every 5 (five) minutes as needed for chest pain. 20 tablet 1   pantoprazole (PROTONIX) 40  MG tablet TAKE 1 TABLET BY MOUTH TWICE A DAY 180 tablet 1   simvastatin (ZOCOR) 20 MG tablet TAKE 1 TABLET BY MOUTH EVERYDAY AT BEDTIME 90 tablet 3   tamsulosin (FLOMAX) 0.4 MG CAPS capsule TAKE 1 CAPSULE BY MOUTH EVERY DAY 90 capsule 3   vitamin B-12 (CYANOCOBALAMIN) 500 MCG tablet Take 500 mcg by mouth daily.     zinc sulfate 220 (50 Zn) MG capsule Take 220 mg by mouth daily.     hydrOXYzine (ATARAX/VISTARIL) 10 MG tablet TAKE 1 TABLET BY MOUTH AT BEDTIME AS NEEDED (Patient not taking: No sig reported) 90 tablet 1   No current facility-administered medications for this visit.     Objective:  BP (!) 114/50    Pulse (!) 50    Temp 97.8 F  (36.6 C) (Temporal)    Ht 6\' 1"  (1.854 m)    Wt 199 lb (90.3 kg)    SpO2 94%    BMI 26.25 kg/m  Gen: NAD, resting comfortably CV: slightly bradycardic no murmurs rubs or gallops Lungs: CTAB no crackles, wheeze, rhonchi Abdomen: soft/nontender/nondistended/normal bowel sounds. No rebound or guarding.  Ext: baseline erythema on bilateral legs- 1+ edema at least Skin: warm, dry Neuro: wheelchair bound    Assessment and Plan   # Cellulitis- about a month ago legs were slightly redder and felt more fatigued- took 4 antibiotic pills as felt lethargic and it seemed to be helpful- improved within 2 days.  -history of chf/cardiomyopathy- on lisinopril and lasix still- stable chronic edema including some venous insufficiency as ewll- continue lasix at least 40 mg a day- 80 mg if swelling more  # Parkinsons- following with Dr. Carles Collet.  On Sinemet- keep close follow up . Some limited walking with son (feels safer with him)  #hypothyroidism S: compliant On thyroid medication- Levothryoxine 50Mg  Lab Results  Component Value Date   TSH 4.10 02/07/2020  A/P:hopefully stable- update tsh today. Continue current meds    #hypertension S: medication: Lisinopril 2.5Mg  (ischemic cardiomyopathy), lasix 40 mg- 80 if swollen more than normal. Not feeling lightheaded/dizzy. Has not been close to any falls BP Readings from Last 3 Encounters:  08/14/20 (!) 114/50  06/11/20 (!) 110/53  05/24/20 (!) 110/56  A/P: Stable. Continue current medications. On lower side but I do not think we can reduce meds with cardiomyopathy- if worsened further we would be forced   #hyperlipidemia S: Medication: Simvastatin 20Mg  Lab Results  Component Value Date   CHOL 96 09/12/2019   HDL 36.20 (L) 09/12/2019   LDLCALC 49 09/12/2019   LDLDIRECT 60.0 04/08/2016   TRIG 51.0 09/12/2019   CHOLHDL 3 09/12/2019   A/P: #s have looked great- update lipids today with labs- they are ok with possible harge if not covered fully under a  year  #GERD- history of duodenal ulcer- wants to try once a day on PPI- I think that's reasonable at this point- wants to hold off on hospital based EGD considering overall health  Recommended follow up: Return in about 4 months (around 12/12/2020) for physical or sooner if needed. Future Appointments  Date Time Provider [place]  08/27/2020  9:00 AM Burnell Blanks, MD [facility]  09/17/2020  3:15 PM Trula Slade, DPM [facility]  11/15/2020  2:30 PM Tat, Eustace Quail, DO [facility] None  08/01/2021  1:00 PM [facility]    Lab/Order associations:   ICD-10-CM   1. Essential hypertension  I10 CBC with Differential/Platelet  Comprehensive metabolic panel    Lipid panel  2. Gastroesophageal reflux disease without esophagitis  K21.9   3. Hypothyroidism due to acquired atrophy of thyroid  E03.4 TSH  4. HYPERCHOLESTEROLEMIA  E78.00 CBC with Differential/Platelet    Comprehensive metabolic panel    TSH    Lipid panel  5. History of duodenal ulcer  Z87.19    Return precautions advised.  Garret Reddish, MD

## 2020-08-13 NOTE — Patient Instructions (Addendum)
Reduce protonix to once a day. Let us know if any worsening fatigue, blood in stool, dark black stools  Please stop by lab before you go If you have mychart- we will send your results within 3 business days of [place] receiving them.  If you do not have mychart- we will call you about results within 5 business days of [place] receiving them.  *please also note that you will see labs on mychart as soon as they post. I will later go in and write notes on them- will say "notes from Dr. Yong Channel"

## 2020-08-14 ENCOUNTER — Other Ambulatory Visit: Payer: Self-pay

## 2020-08-14 ENCOUNTER — Other Ambulatory Visit: Payer: Self-pay | Admitting: Family Medicine

## 2020-08-14 ENCOUNTER — Ambulatory Visit: Payer: Medicare PPO | Admitting: Family Medicine

## 2020-08-14 ENCOUNTER — Encounter: Payer: Self-pay | Admitting: Family Medicine

## 2020-08-14 ENCOUNTER — Other Ambulatory Visit: Payer: Self-pay | Admitting: Cardiovascular Disease

## 2020-08-14 VITALS — BP 114/50 | HR 50 | Temp 97.8°F | Ht 73.0 in | Wt 199.0 lb

## 2020-08-14 DIAGNOSIS — F411 Generalized anxiety disorder: Secondary | ICD-10-CM

## 2020-08-14 DIAGNOSIS — E034 Atrophy of thyroid (acquired): Secondary | ICD-10-CM

## 2020-08-14 DIAGNOSIS — E7800 Pure hypercholesterolemia, unspecified: Secondary | ICD-10-CM

## 2020-08-14 DIAGNOSIS — I10 Essential (primary) hypertension: Secondary | ICD-10-CM

## 2020-08-14 DIAGNOSIS — K219 Gastro-esophageal reflux disease without esophagitis: Secondary | ICD-10-CM | POA: Diagnosis not present

## 2020-08-14 DIAGNOSIS — G20 Parkinson's disease: Secondary | ICD-10-CM | POA: Diagnosis not present

## 2020-08-14 DIAGNOSIS — Z8719 Personal history of other diseases of the digestive system: Secondary | ICD-10-CM

## 2020-08-14 DIAGNOSIS — I5032 Chronic diastolic (congestive) heart failure: Secondary | ICD-10-CM | POA: Diagnosis not present

## 2020-08-14 LAB — LIPID PANEL
Cholesterol: 99 mg/dL (ref 0–200)
HDL: 39.8 mg/dL (ref >39.00)
LDL Cholesterol: 44 mg/dL (ref 0–99)
NonHDL: 59.14
Total CHOL/HDL Ratio: 2
Triglycerides: 77 mg/dL (ref 0.0–149.0)
VLDL: 15.4 mg/dL (ref 0.0–40.0)

## 2020-08-14 LAB — COMPREHENSIVE METABOLIC PANEL
ALT: 7 U/L (ref 0–53)
AST: 32 U/L (ref 0–37)
Albumin: 3.7 g/dL (ref 3.5–5.2)
Alkaline Phosphatase: 75 U/L (ref 39–117)
BUN: 60 mg/dL — ABNORMAL HIGH (ref 6–23)
CO2: 31 mEq/L (ref 19–32)
Calcium: 9.1 mg/dL (ref 8.4–10.5)
Chloride: 100 mEq/L (ref 96–112)
Creatinine, Ser: 1.63 mg/dL — ABNORMAL HIGH (ref 0.40–1.50)
GFR: 38.28 mL/min — ABNORMAL LOW (ref >60.00)
Glucose, Bld: 98 mg/dL (ref 70–99)
Potassium: 4.2 mEq/L (ref 3.5–5.1)
Sodium: 138 mEq/L (ref 135–145)
Total Bilirubin: 0.3 mg/dL (ref 0.2–1.2)
Total Protein: 7 g/dL (ref 6.0–8.3)

## 2020-08-14 LAB — TSH: TSH: 4.59 u[IU]/mL — ABNORMAL HIGH (ref 0.35–4.50)

## 2020-08-14 LAB — CBC WITH DIFFERENTIAL/PLATELET
Basophils Absolute: 0.1 10*3/uL (ref 0.0–0.1)
Basophils Relative: 1 % (ref 0.0–3.0)
Eosinophils Absolute: 0.5 10*3/uL (ref 0.0–0.7)
Eosinophils Relative: 7.1 % — ABNORMAL HIGH (ref 0.0–5.0)
HCT: 31.8 % — ABNORMAL LOW (ref 39.0–52.0)
Hemoglobin: 10.9 g/dL — ABNORMAL LOW (ref 13.0–17.0)
Lymphocytes Relative: 20.1 % (ref 12.0–46.0)
Lymphs Abs: 1.5 10*3/uL (ref 0.7–4.0)
MCHC: 34.2 g/dL (ref 30.0–36.0)
MCV: 104.1 fl — ABNORMAL HIGH (ref 78.0–100.0)
Monocytes Absolute: 0.8 10*3/uL (ref 0.1–1.0)
Monocytes Relative: 10.3 % (ref 3.0–12.0)
Neutro Abs: 4.6 10*3/uL (ref 1.4–7.7)
Neutrophils Relative %: 61.5 % (ref 43.0–77.0)
Platelets: 242 10*3/uL (ref 150.0–400.0)
RBC: 3.05 Mil/uL — ABNORMAL LOW (ref 4.22–5.81)
RDW: 13.8 % (ref 11.5–15.5)
WBC: 7.4 10*3/uL (ref 4.0–10.5)

## 2020-08-17 ENCOUNTER — Ambulatory Visit: Payer: Medicare PPO | Admitting: Cardiovascular Disease

## 2020-08-20 ENCOUNTER — Telehealth: Payer: Self-pay | Admitting: Gastroenterology

## 2020-08-20 ENCOUNTER — Encounter: Payer: Self-pay | Admitting: Family Medicine

## 2020-08-20 NOTE — Telephone Encounter (Signed)
Patient's daughter returning call to nurse.  Requesting to be called at [phone].

## 2020-08-20 NOTE — Telephone Encounter (Signed)
Pt's daughter Zachary Decker called to inform that pt has been experiencing diarrhea. She stated that Dr. Tarri Glenn prescribed Protonix to pt for 1 pill BID. However, Dr Yong Channel decreased dose to 1 pill a day because he was concerned about pt's kidneys. Since change in dose pt has been having diarrhea. She would like some advise on what to know about pt's sxs and dose of Protonix. Pls call pt's wife Zachary Decker at [phone] since Zachary Decker will be unavailable to answer the phone.

## 2020-08-20 NOTE — Telephone Encounter (Signed)
Left message to call back  

## 2020-08-21 NOTE — Telephone Encounter (Signed)
Left message for Olivia Mackie, pts daughter, to call back.

## 2020-08-26 NOTE — Progress Notes (Signed)
Chief Complaint  Patient presents with   Follow-up    CAD    History of Present Illness: 87 yo male with history of HTN, HLD, CAD s/p 5V CABG March 2010, ischemic cardiomyopathy, GERD, BPH here today for follow up. He was admitted March 2010 with an inferior MI complicated by VF arrest multiple times and unsuccessful attempt at PCI in the setting of severe multi-vessel CAD. He then had an urgent 5 V CABG. He is known to have a cardiomyopathy with last echo 2013 showing LVEF 35-40%. Stress myoview 03/08/13 with scar but no ischemia, LVEF=46%. He was admitted to [facility] April 2020 with left leg cellulitis and sepsis with generalized weakness. Recent diagnosis of Parkinson's disease. Echo October 2020 with LVEF=45-50%, mild MR, He was seen in our office 11/09/19 with c/o worsened LE edema and recurrent cellulitis. He was taking Lasix 20 mg every other day. Keflex restarted and Lasix increased to 40 mg daily. His LE edema improved with the increased Lasix dosage.   He is here today for follow up. The patient denies any chest pain, dyspnea, palpitations, orthopnea, PND, dizziness, near syncope or syncope. He has no change in chronic LE edema.   Primary Care Physician: [name], MD  Past Medical History:  Diagnosis Date   Acute myocardial infarction of inferoposterior wall, subsequent episode of care [facility])    CAD (coronary artery disease)    Encounter for long-term (current) use of other medications    Family history of malignant neoplasm of gastrointestinal tract    GERD (gastroesophageal reflux disease)    Hypercholesterolemia    Hypertension    Hypertrophy of prostate with urinary obstruction and other lower urinary tract symptoms (LUTS)    Personal history of thrombophlebitis    PVD (peripheral vascular disease) ([place])    Respiratory failure ([place])    Unspecified adverse effect of unspecified drug, medicinal and biological substance    Unspecified hypothyroidism     Past  Surgical History:  Procedure Laterality Date   BIOPSY  04/28/2019   Procedure: BIOPSY;  Surgeon: Thornton Park, MD;  Location: [facility] ENDOSCOPY;  Service: Gastroenterology;;   CATARACT EXTRACTION, BILATERAL     CHOLECYSTECTOMY  02/26/2012   Procedure: LAPAROSCOPIC CHOLECYSTECTOMY WITH INTRAOPERATIVE CHOLANGIOGRAM;  Surgeon: Madilyn Hook, DO;  Location: [place];  Service: General;  Laterality: N/A;   CORONARY ARTERY BYPASS GRAFT     x 5 on August 29, 2008   ESOPHAGOGASTRODUODENOSCOPY (EGD) WITH PROPOFOL N/A 04/28/2019   Procedure: ESOPHAGOGASTRODUODENOSCOPY (EGD) WITH PROPOFOL;  Surgeon: Thornton Park, MD;  Location: [place];  Service: Gastroenterology;  Laterality: N/A;   REPLACEMENT TOTAL KNEE Left    tachecostomy placement     after heart attack   TONSILLECTOMY      Current Outpatient Medications  Medication Sig Dispense Refill   Acetaminophen (TYLENOL EXTRA STRENGTH PO) Take 1-2 tablets by mouth daily as needed (for back pain).     Ascorbic Acid (VITAMIN C) 1000 MG tablet Take 1,000 mg by mouth daily.     aspirin EC 81 MG tablet Take 81 mg by mouth daily. Swallow whole.     B Complex-C-Folic Acid (FOLBEE PLUS) TABS TAKE 1 TABLET BY MOUTH EVERY DAY 90 tablet 1   carbidopa-levodopa (SINEMET CR) 50-200 MG tablet Take 1 tablet by mouth at bedtime. 90 tablet 1   carbidopa-levodopa (SINEMET) 25-100 MG tablet Take 2 at 7am, 2 at 11am, 1 at 3pm, 1 at 7pm 540 tablet 1   Cholecalciferol (VITAMIN D3) 125 MCG (  5000 UT) TBDP Take 1 tablet by mouth daily.      furosemide (LASIX) 40 MG tablet TAKE 1 TABLET BY MOUTH EVERY DAY 90 tablet 2   hydrOXYzine (ATARAX/VISTARIL) 10 MG tablet TAKE 1 TABLET BY MOUTH AT BEDTIME AS NEEDED 90 tablet 1   levothyroxine (SYNTHROID) 50 MCG tablet TAKE 1 TABLET BY MOUTH DAILY BEFORE BREAKFAST 90 tablet 1   lisinopril (ZESTRIL) 2.5 MG tablet TAKE 1 TABLET BY MOUTH EVERY DAY 90 tablet 3   nitroGLYCERIN (NITROSTAT) 0.4 MG SL tablet Place 1 tablet (0.4  mg total) under the tongue every 5 (five) minutes as needed for chest pain. 20 tablet 1   pantoprazole (PROTONIX) 40 MG tablet TAKE 1 TABLET BY MOUTH TWICE A DAY 180 tablet 1   simvastatin (ZOCOR) 20 MG tablet TAKE 1 TABLET BY MOUTH EVERYDAY AT BEDTIME 90 tablet 3   tamsulosin (FLOMAX) 0.4 MG CAPS capsule TAKE 1 CAPSULE BY MOUTH EVERY DAY 90 capsule 3   vitamin B-12 (CYANOCOBALAMIN) 500 MCG tablet Take 500 mcg by mouth daily.     zinc sulfate 220 (50 Zn) MG capsule Take 220 mg by mouth daily.     No current facility-administered medications for this visit.    Allergies  Allergen Reactions   Losartan Potassium Other (See Comments)    Not known   Naproxen Nausea And Vomiting   Penicillins Rash    Did it involve swelling of the face/tongue/throat, SOB, or low BP? No Did it involve sudden or severe rash/hives, skin peeling, or any reaction on the inside of your mouth or nose? Yes Did you need to seek medical attention at a hospital or doctor's office? Unknown When did it last happen? If all above answers are "NO", may proceed with cephalosporin use.  TOLERATED CEFTRIAXONE 10/07/2018    Social History   Socioeconomic History   Marital status: Married    Spouse name: Apolonio Schneiders   Number of children: 2   Years of education: Not on file   Highest education level: Master's degree (e.g., MA, MS, MEng, MEd, MSW, MBA)  Occupational History    Comment: Retired Careers adviser   Tobacco Use   Smoking status: Never Smoker   Smokeless tobacco: Never Used  Scientific laboratory technician Use: Never used  Substance and Sexual Activity   Alcohol use: No   Drug use: No   Sexual activity: Not on file  Other Topics Concern   Not on file  Social History Narrative   Married 1958. 2 kids (boy, girl). 2 grandkids (boy and girl).       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   Social Determinants of Health   Financial Resource Strain: Low Risk    Difficulty of Paying Living Expenses: Not hard at all  Food Insecurity: No Food Insecurity   Worried About Charity fundraiser in the Last Year: Never true   [place] in the Last Year: Never true  Transportation Needs: No Transportation Needs   Lack of Transportation (Medical): No   Lack of Transportation (Non-Medical): No  Physical Activity: Sufficiently Active   Days of Exercise per Week: 7 days   Minutes of Exercise per Session: 30 min  Stress: No Stress Concern Present   Feeling of Stress : Not  at all  Social Connections: Moderately Integrated   Frequency of Communication with Friends and Family: More than three times a week   Frequency of Social Gatherings with Friends and Family: Never   Attends Religious Services: More than 4 times per year   Active Member of [employer] or Organizations: No   Attends Music therapist: Never   Marital Status: Married  Human resources officer Violence: Not At Risk   Fear of Current or Ex-Partner: No   Emotionally Abused: No   Physically Abused: No   Sexually Abused: No    Family History  Problem Relation Age of Onset   Heart disease Mother    Tuberculosis Father    Colon cancer Brother     Review of Systems:  As stated in the HPI and otherwise negative.   BP 122/60    Pulse 62    Ht 6\' 1"  (1.854 m)    Wt 199 lb (90.3 kg)    SpO2 93%    BMI 26.25 kg/m   Physical Examination: General: Well developed, well nourished, NAD  HEENT: OP clear, mucus membranes moist  SKIN: warm, dry. No rashes. Neuro: No focal deficits  Musculoskeletal: Muscle strength 5/5 all ext  Psychiatric: Mood and affect normal  Neck: No JVD, no carotid bruits, no thyromegaly, no lymphadenopathy.  Lungs:Clear bilaterally, no wheezes, rhonci, crackles Cardiovascular: Regular rate and rhythm. No murmurs, gallops or rubs. Abdomen:Soft.  Bowel sounds present. Non-tender.  Extremities: No lower extremity edema. Pulses are 2 + in the bilateral DP/PT.  Echo October 2020: 1. Left ventricular ejection fraction, by visual estimation, is 45 to  50%. The left ventricle has mildly decreased function. There is no left  ventricular hypertrophy.  2. Basal and mid inferolateral wall is abnormal.  3. Left ventricular diastolic parameters are consistent with Grade I  diastolic dysfunction (impaired relaxation).  4. Global right ventricle has normal systolic function.The right  ventricular size is normal. No increase in right ventricular wall  thickness.  5. Left atrial size was normal.  6. Right atrial size was normal.  7. The mitral valve is normal in structure. Mild mitral valve  regurgitation. No evidence of mitral stenosis.  8. The tricuspid valve is normal in structure. Tricuspid valve  regurgitation is mild.  9. The aortic valve is tricuspid. Aortic valve regurgitation is trivial.  No evidence of aortic valve sclerosis or stenosis.  10. There is Moderate calcification of the aortic valve.  11. There is Moderate thickening of the aortic valve.  12. The pulmonic valve was normal in structure. Pulmonic valve  regurgitation is trivial.  13. Mildly elevated pulmonary artery systolic pressure.  14. The inferior vena cava is normal in size with greater than 50%  respiratory variability, suggesting right atrial pressure of 3 mmHg.  15. No vegetations detected.   EKG:  EKG is not ordered today. The ekg ordered today demonstrates   Recent Labs: 08/14/2020: ALT 7; BUN 60; Creatinine, Ser 1.63; Hemoglobin 10.9; Platelets 242.0; Potassium 4.2; Sodium 138; TSH 4.59   Lipid Panel    Component Value Date/Time   CHOL 99 08/14/2020 1415   TRIG 77.0 08/14/2020 1415   HDL 39.80 08/14/2020 1415   CHOLHDL 2 08/14/2020 1415   VLDL 15.4 08/14/2020 1415   LDLCALC 44 08/14/2020 1415   LDLDIRECT 60.0 04/08/2016 0925     Wt  Readings from Last 3 Encounters:  08/27/20 199 lb (90.3 kg)  08/14/20 199 lb (90.3 kg)  05/24/20 184 lb (83.5 kg)  Other studies Reviewed: Additional studies/ records that were reviewed today include: . Review of the above records demonstrates:    Assessment and Plan:   1. CAD s/p CABG without angina: No chest pain. Continue ASA, statin and beta blocker.      2. Ischemic Cardiomyopathy:  Last LVEF 45-50% by echo in October 2020. Continue beta blocker and Ace-inh  3. HTN: BP is controlled. No changes  4. Hyperlipidemia: Lipids well controlled. LDL 44 in February 2022. Continue statin  5. History of venous stasis disease and LE cellulitis:  He has chronic LE edema, unchanged. He continues to take Lasix 40 mg daily  6. Chronic diastolic CHF: Weight is stable. Continue Lasix  Current medicines are reviewed at length with the patient today.  The patient does not have concerns regarding medicines.  The following changes have been made:  no change  Labs/ tests ordered today include:   No orders of the defined types were placed in this encounter.   Disposition:   F/U with me in 6 months    Signed, Lauree Chandler, MD 08/27/2020 9:35 AM    [facility], [place], [place]  [address] Phone: [phone]; Fax: [phone]

## 2020-08-27 ENCOUNTER — Other Ambulatory Visit: Payer: Self-pay

## 2020-08-27 ENCOUNTER — Ambulatory Visit: Payer: Medicare PPO | Admitting: Cardiovascular Disease

## 2020-08-27 ENCOUNTER — Encounter: Payer: Self-pay | Admitting: Cardiovascular Disease

## 2020-08-27 VITALS — BP 122/60 | HR 62 | Ht 73.0 in | Wt 199.0 lb

## 2020-08-27 DIAGNOSIS — I10 Essential (primary) hypertension: Secondary | ICD-10-CM | POA: Diagnosis not present

## 2020-08-27 DIAGNOSIS — E7800 Pure hypercholesterolemia, unspecified: Secondary | ICD-10-CM

## 2020-08-27 DIAGNOSIS — I2510 Atherosclerotic heart disease of native coronary artery without angina pectoris: Secondary | ICD-10-CM | POA: Diagnosis not present

## 2020-08-27 DIAGNOSIS — I5032 Chronic diastolic (congestive) heart failure: Secondary | ICD-10-CM

## 2020-08-27 DIAGNOSIS — I255 Ischemic cardiomyopathy: Secondary | ICD-10-CM

## 2020-08-27 NOTE — Telephone Encounter (Signed)
Daughter states pt is back on the protonix bid and he is doing fine. His diarrhea has resolved. She knows to call back if he has any other issues.

## 2020-08-27 NOTE — Patient Instructions (Signed)

## 2020-09-05 ENCOUNTER — Encounter: Payer: Self-pay | Admitting: Family Medicine

## 2020-09-17 ENCOUNTER — Ambulatory Visit: Payer: Medicare PPO | Admitting: Podiatry

## 2020-09-17 ENCOUNTER — Other Ambulatory Visit: Payer: Self-pay

## 2020-09-17 DIAGNOSIS — B351 Tinea unguium: Secondary | ICD-10-CM | POA: Diagnosis not present

## 2020-09-17 DIAGNOSIS — M79674 Pain in right toe(s): Secondary | ICD-10-CM | POA: Diagnosis not present

## 2020-09-17 DIAGNOSIS — M79675 Pain in left toe(s): Secondary | ICD-10-CM | POA: Diagnosis not present

## 2020-09-17 DIAGNOSIS — I739 Peripheral vascular disease, unspecified: Secondary | ICD-10-CM

## 2020-09-17 MED ORDER — CEPHALEXIN 500 MG PO CAPS: 500.0000 mg | ORAL_CAPSULE | Freq: Three times a day (TID) | ORAL | 0 refills | Status: AC

## 2020-09-20 NOTE — Progress Notes (Signed)
Subjective: 87 y.o. returns the office today for painful, elongated, thickened toenails which he cannot trim himself. Denies any redness or drainage around the nails. Denies any acute changes since last appointment and no new complaints today. Denies any systemic complaints such as fevers, chills, nausea, vomiting.   PCP: Zachary Olp, MD  Objective: AAO 3, NAD DP/PT pulses palpable 1/4, CRT less than 3 seconds-chronic bilateral lower extremity edema is present. Nails hypertrophic, dystrophic, elongated, brittle, discolored 10. There is tenderness overlying the nails 1-5 bilaterally. There is no surrounding erythema or drainage along the nail sites. No open lesions or pre-ulcerative lesions are identified. No pain with calf compression, warmth, erythema.  Assessment: Patient presents with symptomatic onychomycosis; chronic edema  Plan: -Treatment options including alternatives, risks, complications were discussed -Nails sharply debrided 10 without complication/bleeding. -Due to the edema he has followed up with his cardiologist as well as primary care physician for this.  Continue follow-up. -Discussed daily foot inspection. If there are any changes, to call the office immediately.  -Follow-up in 3 months or sooner if any problems are to arise. In the meantime, encouraged to call the office with any questions, concerns, changes symptoms.  Zachary Decker, DPM

## 2020-10-23 ENCOUNTER — Encounter: Payer: Self-pay | Admitting: Family Medicine

## 2020-10-24 ENCOUNTER — Other Ambulatory Visit: Payer: Self-pay

## 2020-10-24 DIAGNOSIS — I10 Essential (primary) hypertension: Secondary | ICD-10-CM

## 2020-10-25 ENCOUNTER — Other Ambulatory Visit (INDEPENDENT_AMBULATORY_CARE_PROVIDER_SITE_OTHER): Payer: Medicare PPO

## 2020-10-25 ENCOUNTER — Other Ambulatory Visit: Payer: Self-pay

## 2020-10-25 DIAGNOSIS — I10 Essential (primary) hypertension: Secondary | ICD-10-CM

## 2020-10-26 LAB — COMPREHENSIVE METABOLIC PANEL
ALT: 8 U/L (ref 0–53)
AST: 28 U/L (ref 0–37)
Albumin: 3.7 g/dL (ref 3.5–5.2)
Alkaline Phosphatase: 70 U/L (ref 39–117)
BUN: 39 mg/dL — ABNORMAL HIGH (ref 6–23)
CO2: 31 mEq/L (ref 19–32)
Calcium: 9.1 mg/dL (ref 8.4–10.5)
Chloride: 101 mEq/L (ref 96–112)
Creatinine, Ser: 1.47 mg/dL (ref 0.40–1.50)
GFR: 43.27 mL/min — ABNORMAL LOW (ref >60.00)
Glucose, Bld: 103 mg/dL — ABNORMAL HIGH (ref 70–99)
Potassium: 4.1 mEq/L (ref 3.5–5.1)
Sodium: 140 mEq/L (ref 135–145)
Total Bilirubin: 0.4 mg/dL (ref 0.2–1.2)
Total Protein: 6.9 g/dL (ref 6.0–8.3)

## 2020-10-26 LAB — CBC WITH DIFFERENTIAL/PLATELET
Basophils Absolute: 0.1 10*3/uL (ref 0.0–0.1)
Basophils Relative: 1.1 % (ref 0.0–3.0)
Eosinophils Absolute: 0.4 10*3/uL (ref 0.0–0.7)
Eosinophils Relative: 4.9 % (ref 0.0–5.0)
HCT: 30.7 % — ABNORMAL LOW (ref 39.0–52.0)
Hemoglobin: 10.6 g/dL — ABNORMAL LOW (ref 13.0–17.0)
Lymphocytes Relative: 17.5 % (ref 12.0–46.0)
Lymphs Abs: 1.5 10*3/uL (ref 0.7–4.0)
MCHC: 34.5 g/dL (ref 30.0–36.0)
MCV: 104.9 fl — ABNORMAL HIGH (ref 78.0–100.0)
Monocytes Absolute: 0.9 10*3/uL (ref 0.1–1.0)
Monocytes Relative: 10.4 % (ref 3.0–12.0)
Neutro Abs: 5.5 10*3/uL (ref 1.4–7.7)
Neutrophils Relative %: 66.1 % (ref 43.0–77.0)
Platelets: 197 10*3/uL (ref 150.0–400.0)
RBC: 2.92 Mil/uL — ABNORMAL LOW (ref 4.22–5.81)
RDW: 14 % (ref 11.5–15.5)
WBC: 8.3 10*3/uL (ref 4.0–10.5)

## 2020-10-30 ENCOUNTER — Emergency Department (HOSPITAL_BASED_OUTPATIENT_CLINIC_OR_DEPARTMENT_OTHER)
Admission: EM | Admit: 2020-10-30 | Discharge: 2020-10-30 | Disposition: A | Discharge status: Home / Self Care | Payer: Medicare PPO | Attending: Emergency Medicine | Admitting: Emergency Medicine

## 2020-10-30 ENCOUNTER — Encounter: Payer: Self-pay | Admitting: Family Medicine

## 2020-10-30 ENCOUNTER — Telehealth: Payer: Self-pay

## 2020-10-30 ENCOUNTER — Other Ambulatory Visit: Payer: Self-pay

## 2020-10-30 ENCOUNTER — Encounter (HOSPITAL_BASED_OUTPATIENT_CLINIC_OR_DEPARTMENT_OTHER): Payer: Self-pay | Admitting: Emergency Medicine

## 2020-10-30 DIAGNOSIS — I130 Hypertensive heart and chronic kidney disease with heart failure and stage 1 through stage 4 chronic kidney disease, or unspecified chronic kidney disease: Secondary | ICD-10-CM | POA: Insufficient documentation

## 2020-10-30 DIAGNOSIS — N183 Chronic kidney disease, stage 3 unspecified: Secondary | ICD-10-CM | POA: Insufficient documentation

## 2020-10-30 DIAGNOSIS — Z7982 Long term (current) use of aspirin: Secondary | ICD-10-CM | POA: Insufficient documentation

## 2020-10-30 DIAGNOSIS — L03211 Cellulitis of face: Secondary | ICD-10-CM | POA: Insufficient documentation

## 2020-10-30 DIAGNOSIS — Z79899 Other long term (current) drug therapy: Secondary | ICD-10-CM | POA: Insufficient documentation

## 2020-10-30 DIAGNOSIS — Z96652 Presence of left artificial knee joint: Secondary | ICD-10-CM | POA: Diagnosis not present

## 2020-10-30 DIAGNOSIS — I2510 Atherosclerotic heart disease of native coronary artery without angina pectoris: Secondary | ICD-10-CM | POA: Insufficient documentation

## 2020-10-30 DIAGNOSIS — Z85828 Personal history of other malignant neoplasm of skin: Secondary | ICD-10-CM | POA: Insufficient documentation

## 2020-10-30 DIAGNOSIS — E039 Hypothyroidism, unspecified: Secondary | ICD-10-CM | POA: Diagnosis not present

## 2020-10-30 DIAGNOSIS — Z951 Presence of aortocoronary bypass graft: Secondary | ICD-10-CM | POA: Insufficient documentation

## 2020-10-30 DIAGNOSIS — R220 Localized swelling, mass and lump, head: Secondary | ICD-10-CM | POA: Diagnosis present

## 2020-10-30 DIAGNOSIS — I5032 Chronic diastolic (congestive) heart failure: Secondary | ICD-10-CM | POA: Insufficient documentation

## 2020-10-30 DIAGNOSIS — G20 Parkinson's disease: Secondary | ICD-10-CM | POA: Diagnosis not present

## 2020-10-30 MED ORDER — CEPHALEXIN 500 MG PO CAPS: 500.0000 mg | ORAL_CAPSULE | Freq: Two times a day (BID) | ORAL | 0 refills | Status: AC

## 2020-10-30 NOTE — Telephone Encounter (Signed)
Patient is in ED,

## 2020-10-30 NOTE — Telephone Encounter (Signed)
Please schedule virtual for pt. 

## 2020-10-30 NOTE — ED Provider Notes (Signed)
[facility] EMERGENCY DEPARTMENT Provider Note   CSN: [id number] Arrival date & time: 10/30/20  1312     History No chief complaint on file.   Zachary Decker is a 87 y.o. male.  87 yo M with a chief complaints of left-sided facial pain and swelling.  It started yesterday evening and is worsened throughout the day.  They have taken 2 doses of antibiotics and feel like he is doing doing much better.  Has been getting cellulitis frequently.  Tends to improve rapidly with antibiotics.  Has Keflex at home for emergencies.  States that normally he is very sleepy.  They deny any fevers.  Denies any dental issues.  Denies any pain to the eye.  Denies any change in vision.  The history is provided by the patient.  Illness Severity:  Moderate Onset quality:  Gradual Duration:  1 day Timing:  Constant Progression:  Worsening Chronicity:  New Associated symptoms: no abdominal pain, no chest pain, no congestion, no diarrhea, no fever, no headaches, no myalgias, no rash, no shortness of breath and no vomiting        Past Medical History:  Diagnosis Date  . Acute myocardial infarction of inferoposterior wall, subsequent episode of care ([place])   . CAD (coronary artery disease)   . Encounter for long-term (current) use of other medications   . Family history of malignant neoplasm of gastrointestinal tract   . GERD (gastroesophageal reflux disease)   . Hypercholesterolemia   . Hypertension   . Hypertrophy of prostate with urinary obstruction and other lower urinary tract symptoms (LUTS)   . Personal history of thrombophlebitis   . PVD (peripheral vascular disease) ([place])   . Respiratory failure ([place])   . Unspecified adverse effect of unspecified drug, medicinal and biological substance   . Unspecified hypothyroidism     Patient Active Problem List   Diagnosis Date Noted  . Chronic diastolic CHF (congestive heart failure), NYHA class 2 ([place]) 08/14/2020  . Onychomycosis  12/20/2019  . Atherosclerosis of aorta ([place]) 08/16/2019  . Duodenal ulcer   . MSSA bacteremia 04/25/2019  . Cellulitis of right leg 04/20/2019  . Dehydration 04/20/2019  . Generalized weakness 04/20/2019  . Cellulitis of left foot   . Cellulitis of left leg 10/07/2018  . Parkinson's disease ([place]) 04/13/2018  . Senile purpura ([place]) 04/13/2018  . Insomnia 04/13/2018  . Low back pain 04/19/2015  . CKD (chronic kidney disease), stage III ([place]) 04/20/2014  . Anxiety state 04/17/2014  . CAD w/ hx MI s/p CABG 09/07/2012  . ANEMIA, VITAMIN B12 DEFICIENCY 04/22/2010  . Cardiomyopathy, ischemic 03/22/2010  . Actinic keratosis 06/25/2009  . BASAL CELL CARCINOMA, NOSE 02/05/2009  . Localized osteoarthrosis, lower leg 12/06/2008  . HYPERCHOLESTEROLEMIA 10/31/2008  . BPH (benign prostatic hyperplasia) 09/28/2007  . Hypothyroidism 03/26/2007  . Essential hypertension 03/22/2007  . GERD 03/22/2007    Past Surgical History:  Procedure Laterality Date  . BIOPSY  04/28/2019   Procedure: BIOPSY;  Surgeon: Thornton Park, MD;  Location: [facility];  Service: Gastroenterology;;  . CATARACT EXTRACTION, BILATERAL    . CHOLECYSTECTOMY  02/26/2012   Procedure: LAPAROSCOPIC CHOLECYSTECTOMY WITH INTRAOPERATIVE CHOLANGIOGRAM;  Surgeon: Madilyn Hook, DO;  Location: [facility];  Service: General;  Laterality: N/A;  . CORONARY ARTERY BYPASS GRAFT     x 5 on August 29, 2008  . ESOPHAGOGASTRODUODENOSCOPY (EGD) WITH PROPOFOL N/A 04/28/2019   Procedure: ESOPHAGOGASTRODUODENOSCOPY (EGD) WITH PROPOFOL;  Surgeon: Thornton Park, MD;  Location: [facility];  Service:  Gastroenterology;  Laterality: N/A;  . REPLACEMENT TOTAL KNEE Left   . tachecostomy placement     after heart attack  . TONSILLECTOMY         Family History  Problem Relation Age of Onset  . Heart disease Mother   . Tuberculosis Father   . Colon cancer Brother     Social History   Tobacco Use  . Smoking status: Never Smoker  . Smokeless  tobacco: Never Used  Vaping Use  . Vaping Use: Never used  Substance Use Topics  . Alcohol use: No  . Drug use: No    Home Medications Prior to Admission medications   Medication Sig Start Date End Date Taking? Authorizing Provider  cephALEXin (KEFLEX) 500 MG capsule Take 1 capsule (500 mg total) by mouth 2 (two) times daily for 7 days. 10/30/20 11/06/20 Yes Deno Etienne, DO  Acetaminophen (TYLENOL EXTRA STRENGTH PO) Take 1-2 tablets by mouth daily as needed (for back pain).    [provider]  Ascorbic Acid (VITAMIN C) 1000 MG tablet Take 1,000 mg by mouth daily.    [provider]  aspirin EC 81 MG tablet Take 81 mg by mouth daily. Swallow whole.    [provider]  B Complex-C-Folic Acid (FOLBEE PLUS) TABS TAKE 1 TABLET BY MOUTH EVERY DAY 08/14/20   [name], MD  carbidopa-levodopa (SINEMET CR) 50-200 MG tablet Take 1 tablet by mouth at bedtime. 05/24/20   Tat, Eustace Quail, DO  carbidopa-levodopa (SINEMET) 25-100 MG tablet Take 2 at 7am, 2 at 11am, 1 at 3pm, 1 at 7pm 05/24/20   Tat, Rebecca S, DO  Cholecalciferol (VITAMIN D3) 125 MCG (5000 UT) TBDP Take 1 tablet by mouth daily.     [provider]  furosemide (LASIX) 40 MG tablet TAKE 1 TABLET BY MOUTH EVERY DAY 08/14/20   Burnell Blanks, MD  hydrOXYzine (ATARAX/VISTARIL) 10 MG tablet TAKE 1 TABLET BY MOUTH AT BEDTIME AS NEEDED 10/13/19   [name], MD  levothyroxine (SYNTHROID) 50 MCG tablet TAKE 1 TABLET BY MOUTH DAILY BEFORE BREAKFAST 08/14/20   [name], MD  lisinopril (ZESTRIL) 2.5 MG tablet TAKE 1 TABLET BY MOUTH EVERY DAY 08/14/20   [name], MD  nitroGLYCERIN (NITROSTAT) 0.4 MG SL tablet Place 1 tablet (0.4 mg total) under the tongue every 5 (five) minutes as needed for chest pain. 04/13/18   [name], MD  pantoprazole (PROTONIX) 40 MG tablet TAKE 1 TABLET BY MOUTH TWICE A DAY 08/03/20   [name], MD  simvastatin (ZOCOR) 20 MG tablet TAKE 1 TABLET  BY MOUTH EVERYDAY AT BEDTIME 03/05/20   [name], MD  tamsulosin (FLOMAX) 0.4 MG CAPS capsule TAKE 1 CAPSULE BY MOUTH EVERY DAY 05/14/20   [name], MD  vitamin B-12 (CYANOCOBALAMIN) 500 MCG tablet Take 500 mcg by mouth daily.    [provider]  zinc sulfate 220 (50 Zn) MG capsule Take 220 mg by mouth daily.    [provider]    Allergies    Losartan potassium, Naproxen, and Penicillins  Review of Systems   Review of Systems  Constitutional: Negative for chills and fever.  HENT: Negative for congestion and facial swelling.   Eyes: Negative for discharge and visual disturbance.  Respiratory: Negative for shortness of breath.   Cardiovascular: Negative for chest pain and palpitations.  Gastrointestinal: Negative for abdominal pain, diarrhea and vomiting.  Musculoskeletal: Negative for arthralgias and myalgias.  Skin:  Negative for color change and rash.  Neurological: Negative for tremors, syncope and headaches.  Psychiatric/Behavioral: Negative for confusion and dysphoric mood.    Physical Exam Updated Vital Signs BP (!) 109/52 (BP Location: Right Arm)   Pulse (!) 55   Temp 97.7 F (36.5 C) (Oral)   Resp 18   Ht 6\' 2"  (1.88 m)   Wt 90.7 kg   SpO2 94%   BMI 25.68 kg/m   Physical Exam Vitals and nursing note reviewed.  Constitutional:      Appearance: He is well-developed.  HENT:     Head: Normocephalic and atraumatic.     Comments: Obvious swelling to the left side of the face.  Periorbital difficult to visualize eye.  Rhinorrhea.  No obvious intraoral pathology.  Left TM is normal.  There are 2 areas of scabbing to the forehead and then left temporal area. Eyes:     Pupils: Pupils are equal, round, and reactive to light.  Neck:     Vascular: No JVD.  Cardiovascular:     Rate and Rhythm: Normal rate and regular rhythm.     Heart sounds: No murmur heard. No friction rub. No gallop.   Pulmonary:     Effort: No respiratory distress.      Breath sounds: No wheezing.  Abdominal:     General: There is no distension.     Tenderness: There is no abdominal tenderness. There is no guarding or rebound.  Musculoskeletal:        General: Normal range of motion.     Cervical back: Normal range of motion and neck supple.  Skin:    Coloration: Skin is not pale.     Findings: No rash.  Neurological:     Mental Status: He is alert and oriented to person, place, and time.  Psychiatric:        Behavior: Behavior normal.     ED Results / Procedures / Treatments   Labs (all labs ordered are listed, but only abnormal results are displayed) Labs Reviewed - No data to display  EKG None  Radiology No results found.  Procedures Procedures   Medications Ordered in ED Medications - No data to display  ED Course  I have reviewed the triage vital signs and the nursing notes.  Pertinent labs & imaging results that were available during my care of the patient were reviewed by me and considered in my medical decision making (see chart for details).    MDM Rules/Calculators/A&P                          87 yo M with a chief complaints of facial pain and swelling.  Going on since yesterday afternoon.  Family is concerned that he has cellulitis.  Has had multiple bouts though typically in his lower extremities.  On exam the patient does have some significant erythema and facial swelling on the left.  He reportedly has had some significant improvement just with 2 doses of Keflex at home.  They would prefer not to have him come into the hospital.  I do not see any obvious abscess.  Seems less likely to be primary ocular pathology.  Could be shingles with the patient having some areas to his forehead and left temporal area though the family thinks maybe he has scratched it.  They deny any plant exposure.  As the patient has had significant improvement on his home Keflex it seems reasonable to continue  that as an outpatient.  I did  discuss with them if he has had significant worsening just since last night that it would be reasonable to place him into the hospital.  They will follow-up with their family doctor in the office.  Will return for any worsening.  1:58 PM:  I have discussed the diagnosis/risks/treatment options with the patient and believe the pt to be eligible for discharge home to follow-up with PCP. We also discussed returning to the ED immediately if new or worsening sx occur. We discussed the sx which are most concerning (e.g., sudden worsening pain, fever, inability to tolerate by mouth) that necessitate immediate return. Medications administered to the patient during their visit and any new prescriptions provided to the patient are listed below.  Medications given during this visit Medications - No data to display   The patient appears reasonably screen and/or stabilized for discharge and I doubt any other medical condition or other [facility] requiring further screening, evaluation, or treatment in the ED at this time prior to discharge.   Final Clinical Impression(s) / ED Diagnoses Final diagnoses:  Facial cellulitis    Rx / DC Orders ED Discharge Orders         Ordered    cephALEXin (KEFLEX) 500 MG capsule  2 times daily        10/30/20 [place], [place], DO 10/30/20 1359

## 2020-10-30 NOTE — Discharge Instructions (Signed)
Continue your antibiotics.  Please return for any worsening or if you would like to be reevaluated or if you feel like he is not doing well at home.

## 2020-10-30 NOTE — Telephone Encounter (Signed)
Nurse Assessment Nurse: Thad Ranger RN, Denise Date/Time (Eastern Time): 10/30/2020 12:02:41 PM Confirm and document reason for call. If symptomatic, describe symptoms. ---Caller states her father may have cellulitis on his face according to a NP that is at his home. Caller states he out of it this morning and was given a Cephalexin. She states his eyes are swollen shut and is very red on the side of his face. Has a abrasion on his forehead and and the forehead, eye, and left side of face is swollen. Finished 7 days of Keflex last wk and given another Rx for Keflex to take for emergency. He is confused today. No fever. Does the patient have any new or worsening symptoms? ---Yes Will a triage be completed? ---Yes Related visit to physician within the last 2 weeks? ---No Does the PT have any chronic conditions? (i.e. diabetes, asthma, this includes High risk factors for pregnancy, etc.) ---Yes List chronic conditions. ---CAD, CHF, Parkinson's dx Is this a behavioral health or substance abuse call? ---No PLEASE NOTE: All timestamps contained within this report are represented as [place] Standard Time. CONFIDENTIALTY NOTICE: This fax transmission is intended only for the addressee. It contains information that is legally privileged, confidential or otherwise protected from use or disclosure. If you are not the intended recipient, you are strictly prohibited from reviewing, disclosing, copying using or disseminating any of this information or taking any action in reliance on or regarding this information. If you have received this fax in error, please notify us immediately by telephone so that we can arrange for its return to [place]. Phone: [phone], Toll-Free: [phone], Fax: [phone] Page: 2 of 2 Call Id: [id number] Guidelines Guideline Title Affirmed Question Affirmed Notes Nurse Date/Time Eilene Ghazi Time) Cellulitis on Antibiotic Follow-up Call Sounds like  a life-threatening emergency to the triager [place], RN, [facility] 10/30/2020 12:05:54 PM Disp. Time Eilene Ghazi Time) Disposition Final User 10/30/2020 12:11:15 PM 911 Outcome Documentation Carmon, RN, Langley Gauss Reason: Pt dtr refused to call 911 and will take to ER 10/30/2020 12:09:27 PM Call EMS 911 Now Yes Carmon, RN, Yevette Edwards Disagree/Comply Disagree Caller Understands Yes PreDisposition Call Doctor Care Advice Given Per Guideline CALL EMS 911 NOW: CARE ADVICE given per Cellulitis on Antibiotic Follow-Up Call (Adult) guideline. Comments User: Romeo Apple, RN Date/Time Eilene Ghazi Time): 10/30/2020 12:07:00 PM Pt having diff standing and can only stand w/asst. Referrals GO TO FACILITY REFUSE

## 2020-10-30 NOTE — ED Triage Notes (Signed)
Patient with chronic issues with cellulitis. Nurse practitioner friend visited this am and suggested ED care for suspected facial cellulitis.

## 2020-10-31 NOTE — Telephone Encounter (Signed)
Rennis Golden, pts daughter, and LVM to schedule appt.

## 2020-11-01 NOTE — Progress Notes (Signed)
Phone [phone] In person visit   Subjective:   Zachary Decker is a 87 y.o. year old very pleasant male patient who presents for/with See problem oriented charting Chief Complaint  Patient presents with  . Cellulitis    Left side of his face    This visit occurred during the SARS-CoV-2 public health emergency.  Safety protocols were in place, including screening questions prior to the visit, additional usage of staff PPE, and extensive cleaning of exam room while observing appropriate contact time as indicated for disinfecting solutions.   Past Medical History-  Patient Active Problem List   Diagnosis Date Noted  . Duodenal ulcer     Priority: High  . MSSA bacteremia 04/25/2019    Priority: High  . Cellulitis of right leg 04/20/2019    Priority: High  . Parkinson's disease ([place]) 04/13/2018    Priority: High  . CAD w/ hx MI s/p CABG 09/07/2012    Priority: High  . Cardiomyopathy, ischemic 03/22/2010    Priority: High  . Atherosclerosis of aorta ([place]) 08/16/2019    Priority: Medium  . Low back pain 04/19/2015    Priority: Medium  . CKD (chronic kidney disease), stage III ([place]) 04/20/2014    Priority: Medium  . Anxiety state 04/17/2014    Priority: Medium  . HYPERCHOLESTEROLEMIA 10/31/2008    Priority: Medium  . BPH (benign prostatic hyperplasia) 09/28/2007    Priority: Medium  . Hypothyroidism 03/26/2007    Priority: Medium  . Essential hypertension 03/22/2007    Priority: Medium  . Senile purpura ([place]) 04/13/2018    Priority: Low  . ANEMIA, VITAMIN B12 DEFICIENCY 04/22/2010    Priority: Low  . Actinic keratosis 06/25/2009    Priority: Low  . BASAL CELL CARCINOMA, NOSE 02/05/2009    Priority: Low  . Localized osteoarthrosis, lower leg 12/06/2008    Priority: Low  . GERD 03/22/2007    Priority: Low  . Chronic diastolic CHF (congestive heart failure), NYHA class 2 ([place]) 08/14/2020  . Onychomycosis 12/20/2019  . Dehydration 04/20/2019  . Generalized  weakness 04/20/2019  . Cellulitis of left foot   . Cellulitis of left leg 10/07/2018  . Insomnia 04/13/2018    Medications- reviewed and updated Current Outpatient Medications  Medication Sig Dispense Refill  . Acetaminophen (TYLENOL EXTRA STRENGTH PO) Take 1-2 tablets by mouth daily as needed (for back pain).    . Ascorbic Acid (VITAMIN C) 1000 MG tablet Take 1,000 mg by mouth daily.    Marland Kitchen aspirin EC 81 MG tablet Take 81 mg by mouth daily. Swallow whole.    . B Complex-C-Folic Acid (FOLBEE PLUS) TABS TAKE 1 TABLET BY MOUTH EVERY DAY 90 tablet 1  . carbidopa-levodopa (SINEMET CR) 50-200 MG tablet Take 1 tablet by mouth at bedtime. 90 tablet 1  . carbidopa-levodopa (SINEMET) 25-100 MG tablet Take 2 at 7am, 2 at 11am, 1 at 3pm, 1 at 7pm 540 tablet 1  . cephALEXin (KEFLEX) 500 MG capsule Take 1 capsule (500 mg total) by mouth 2 (two) times daily for 7 days. 14 capsule 0  . cephALEXin (KEFLEX) 500 MG capsule Take 1 capsule (500 mg total) by mouth 3 (three) times daily for 8 days. 24 capsule 0  . Cholecalciferol (VITAMIN D3) 125 MCG (5000 UT) TBDP Take 1 tablet by mouth daily.     . furosemide (LASIX) 40 MG tablet TAKE 1 TABLET BY MOUTH EVERY DAY 90 tablet 2  . hydrOXYzine (ATARAX/VISTARIL) 10 MG tablet TAKE 1 TABLET BY MOUTH  AT BEDTIME AS NEEDED 90 tablet 1  . levothyroxine (SYNTHROID) 50 MCG tablet TAKE 1 TABLET BY MOUTH DAILY BEFORE BREAKFAST 90 tablet 1  . lisinopril (ZESTRIL) 2.5 MG tablet TAKE 1 TABLET BY MOUTH EVERY DAY 90 tablet 3  . nitroGLYCERIN (NITROSTAT) 0.4 MG SL tablet Place 1 tablet (0.4 mg total) under the tongue every 5 (five) minutes as needed for chest pain. 20 tablet 1  . pantoprazole (PROTONIX) 40 MG tablet TAKE 1 TABLET BY MOUTH TWICE A DAY 180 tablet 1  . predniSONE (DELTASONE) 20 MG tablet Take 1 tablet by mouth daily for 5 days, then 1/2 tablet daily for 2 days 6 tablet 0  . simvastatin (ZOCOR) 20 MG tablet TAKE 1 TABLET BY MOUTH EVERYDAY AT BEDTIME 90 tablet 3  .  tamsulosin (FLOMAX) 0.4 MG CAPS capsule TAKE 1 CAPSULE BY MOUTH EVERY DAY 90 capsule 3  . valACYclovir (VALTREX) 1000 MG tablet Take 1 tablet (1,000 mg total) by mouth 3 (three) times daily. For 7 days for shingles. 21 tablet 0  . vitamin B-12 (CYANOCOBALAMIN) 500 MCG tablet Take 500 mcg by mouth daily.    Marland Kitchen zinc sulfate 220 (50 Zn) MG capsule Take 220 mg by mouth daily.     No current facility-administered medications for this visit.     Objective:  BP (!) 114/56   Pulse (!) 58   Temp 98 F (36.7 C) (Temporal)   Ht 6\' 1"  (1.854 m)   Wt 201 lb 12.8 oz (91.5 kg)   SpO2 95%   BMI 26.62 kg/m  Gen: NAD, resting comfortably CV: RRR no murmurs rubs or gallops Lungs: CTAB no crackles, wheeze, rhonch Ext: no edema Skin: warm, dry Neuro: wheelchair bound- known parkinsons Multiple vesicles on left side of face in v1, v2 distribution some crusted over- swollen eyelid. Entire area slightly warm to touch but not very tender        Assessment and Plan  Cellulitis on Face S: late Sunday night started with small blister on left forehead. Progressed through the week- ended up with ED visit on 10/30/20. Patient has keflex at home due to recurrent leg cellulitis. Started antibiotic on Tuesday 3x a day with symptoms worsening. In the ED- they recommended continuing antibiotic- they rxed at twice a day.   Never vaccinated against shingles. No pain with eye movement. 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   from avs  " Start valtrex today  I would like for you to see your eye doctor today if possible- please call them and tell them your primary care doctor is concerned about herpes zoster ophthalmicus - possible shingles in the eye and want ASAP evaluation  I refilled the keflex for over the weekend use for cellulitis  issues. You can finish the 7 day course at 3x a day and should have some leftover but I do not think this is primarily a skin infection from bacteria- I think this is from shingles  Prednisone in the AM please  If you get fevers or pain with eye movement- I think you need immediate evaluation over the weekend"   We are going to cover for possible secondary bacterial infection by continuing keflex but I think main issue is shingles. Also give dose of ceftriaxone 1 g today   Also please review updates on avs  Recommended follow up:  As needed for new or worsening or persistent symptoms Future Appointments  Date Time Provider [place]  11/15/2020  2:30 PM Tat, Eustace Quail, DO [facility] None  11/26/2020  3:15 PM Trula Slade, DPM [facility]  12/17/2020  1:40 PM [name], MD [facility]  08/01/2021  1:00 PM [facility] HEALTH COACH [facility]    Lab/Order associations:   ICD-10-CM   1. Herpes zoster with ophthalmic complication, unspecified herpes zoster eye disease  B02.30   2. Essential hypertension  I10 CBC with Differential/Platelet    Comprehensive metabolic panel  3. Cellulitis of face  L03.211     Meds ordered this encounter  Medications  . valACYclovir (VALTREX) 1000 MG tablet    Sig: Take 1 tablet (1,000 mg total) by mouth 3 (three) times daily. For 7 days for shingles.    Dispense:  21 tablet    Refill:  0  . cephALEXin (KEFLEX) 500 MG capsule    Sig: Take 1 capsule (500 mg total) by mouth 3 (three) times daily for 8 days.    Dispense:  24 capsule    Refill:  0  . predniSONE (DELTASONE) 20 MG tablet    Sig: Take 1 tablet by mouth daily for 5 days, then 1/2 tablet daily for 2 days    Dispense:  6 tablet    Refill:  0    Time Spent: 45 minutes of total time (9:30 AM- 10:15 AM) was spent on the date of the encounter performing the following actions: chart review prior to seeing the patient, obtaining history, performing a medically necessary  exam, counseling on the treatment plan, placing orders, and documenting in our EHR.   Return precautions advised.  Garret Reddish, MD

## 2020-11-01 NOTE — Patient Instructions (Addendum)
Please stop by lab before you go If you have mychart- we will send your results within 3 business days of [place] receiving them.  If you do not have mychart- we will call you about results within 5 business days of [place] receiving them.  *please also note that you will see labs on mychart as soon as they post. I will later go in and write notes on them- will say "notes from Dr. Yong Channel"   Start valtrex today  Team Also give dose of ceftriaxone 1 g today IM -hold keflex 24 hours then restart  I would like for you to see your eye doctor today if possible- please call them and tell them your primary care doctor is concerned about herpes zoster ophthalmicus.  - possible shingles in the eye and want ASAP evaluation  I refilled the keflex for over the weekend use for cellulitis issues. You can finish the 7 day course at 3x a day and should have some leftover but I do not think this is primarily a skin infection from bacteria- I think this is from shingles  Prednisone in the AM please  If you get fevers or pain with eye movement- I think you need immediate evaluation over the weekend

## 2020-11-02 ENCOUNTER — Encounter: Payer: Self-pay | Admitting: Family Medicine

## 2020-11-02 ENCOUNTER — Ambulatory Visit: Payer: Medicare PPO | Admitting: Family Medicine

## 2020-11-02 ENCOUNTER — Other Ambulatory Visit: Payer: Self-pay

## 2020-11-02 VITALS — BP 114/56 | HR 58 | Temp 98.0°F | Ht 73.0 in | Wt 201.8 lb

## 2020-11-02 DIAGNOSIS — L03211 Cellulitis of face: Secondary | ICD-10-CM

## 2020-11-02 DIAGNOSIS — I10 Essential (primary) hypertension: Secondary | ICD-10-CM | POA: Diagnosis not present

## 2020-11-02 DIAGNOSIS — B0230 Zoster ocular disease, unspecified: Secondary | ICD-10-CM | POA: Diagnosis not present

## 2020-11-02 DIAGNOSIS — B0239 Other herpes zoster eye disease: Secondary | ICD-10-CM | POA: Diagnosis not present

## 2020-11-02 LAB — CBC WITH DIFFERENTIAL/PLATELET
Basophils Absolute: 0 10*3/uL (ref 0.0–0.1)
Basophils Relative: 0.6 % (ref 0.0–3.0)
Eosinophils Absolute: 0.2 10*3/uL (ref 0.0–0.7)
Eosinophils Relative: 3.2 % (ref 0.0–5.0)
HCT: 28.7 % — ABNORMAL LOW (ref 39.0–52.0)
Hemoglobin: 9.9 g/dL — ABNORMAL LOW (ref 13.0–17.0)
Lymphocytes Relative: 27.9 % (ref 12.0–46.0)
Lymphs Abs: 1.7 10*3/uL (ref 0.7–4.0)
MCHC: 34.6 g/dL (ref 30.0–36.0)
MCV: 104.2 fl — ABNORMAL HIGH (ref 78.0–100.0)
Monocytes Absolute: 1 10*3/uL (ref 0.1–1.0)
Monocytes Relative: 16.2 % — ABNORMAL HIGH (ref 3.0–12.0)
Neutro Abs: 3.2 10*3/uL (ref 1.4–7.7)
Neutrophils Relative %: 52.1 % (ref 43.0–77.0)
Platelets: 184 10*3/uL (ref 150.0–400.0)
RBC: 2.75 Mil/uL — ABNORMAL LOW (ref 4.22–5.81)
RDW: 14.2 % (ref 11.5–15.5)
WBC: 6.2 10*3/uL (ref 4.0–10.5)

## 2020-11-02 LAB — COMPREHENSIVE METABOLIC PANEL
ALT: 7 U/L (ref 0–53)
AST: 41 U/L — ABNORMAL HIGH (ref 0–37)
Albumin: 3.6 g/dL (ref 3.5–5.2)
Alkaline Phosphatase: 69 U/L (ref 39–117)
BUN: 50 mg/dL — ABNORMAL HIGH (ref 6–23)
CO2: 27 mEq/L (ref 19–32)
Calcium: 8.9 mg/dL (ref 8.4–10.5)
Chloride: 99 mEq/L (ref 96–112)
Creatinine, Ser: 1.62 mg/dL — ABNORMAL HIGH (ref 0.40–1.50)
GFR: 38.5 mL/min — ABNORMAL LOW (ref >60.00)
Glucose, Bld: 102 mg/dL — ABNORMAL HIGH (ref 70–99)
Potassium: 3.9 mEq/L (ref 3.5–5.1)
Sodium: 136 mEq/L (ref 135–145)
Total Bilirubin: 0.5 mg/dL (ref 0.2–1.2)
Total Protein: 6.8 g/dL (ref 6.0–8.3)

## 2020-11-02 MED ORDER — VALACYCLOVIR HCL 1 G PO TABS: 1000.0000 mg | ORAL_TABLET | Freq: Three times a day (TID) | ORAL | 0 refills | Status: DC

## 2020-11-02 MED ORDER — CEFTRIAXONE SODIUM 1 G IJ SOLR
1.0000 g | Freq: Once | INTRAMUSCULAR | Status: AC
  Administered 2020-11-02: 1 g via INTRAMUSCULAR

## 2020-11-02 MED ORDER — CEPHALEXIN 500 MG PO CAPS: 500.0000 mg | ORAL_CAPSULE | Freq: Three times a day (TID) | ORAL | 0 refills | Status: AC

## 2020-11-02 MED ORDER — PREDNISONE 20 MG PO TABS: ORAL_TABLET | ORAL | 0 refills | Status: DC

## 2020-11-02 NOTE — Addendum Note (Signed)
Addended by: Linton Ham on: 11/02/2020 10:36 AM   Modules accepted: Orders

## 2020-11-05 ENCOUNTER — Encounter: Payer: Self-pay | Admitting: Family Medicine

## 2020-11-05 ENCOUNTER — Other Ambulatory Visit: Payer: Self-pay

## 2020-11-05 DIAGNOSIS — D649 Anemia, unspecified: Secondary | ICD-10-CM

## 2020-11-05 NOTE — Progress Notes (Signed)
Called patient and they will call back with an appointment

## 2020-11-08 DIAGNOSIS — B0239 Other herpes zoster eye disease: Secondary | ICD-10-CM | POA: Diagnosis not present

## 2020-11-13 NOTE — Progress Notes (Signed)
Assessment/Plan:   1.  Parkinsons Disease  -continue carbidopa/levodopa 25/100, 2 tablets at 7 AM/2 tablets at 11 AM/1 tablet at 3 PM/1 tablet at 7 PM  -Continue carbidopa/levodopa 50/200 at bedtime  2.  Eyelid opening apraxia  -doing okay right now.  Did better after botox  3.  Abnormal brain scan  -We decided not to pursue lumbar puncture after levodopa helped symptoms  4.  Low BP  -Primary care monitoring.  Cardiology wanted the patient on Zestril.  Tamsulosin and Lasix may also be contributing.  5.  Sialorrhea  -This is commonly associated with PD.  We talked about treatments.  The patient is not a candidate for oral anticholinergic therapy because of increased risk of confusion and falls.  We discussed Botox (type A and B) and 1% atropine drops.  We discusssed that candy like lemon drops can help by stimulating mm of the oropharynx to induce swallowing.  He doesn't want botox    Subjective:   Zachary Decker was seen today in follow up for Parkinsons disease.  My previous records were reviewed prior to todays visit as well as outside records available to me. Pt denies falls.  Ambulates with the walker but doesn't walk much and only walks if son is around. Pt denies lightheadedness, near syncope.  No hallucinations.  Mood has been good.  "i've been sleeping well."   Patient was in the emergency room May 10 with facial swelling, possible cellulitis.  Followed up with primary care and the concern was for shingles and herpes zoster ophthalmicus.  Current prescribed movement disorder medications:  Carbidopa/levodopa 25/100, 2 tablets at 7 AM, 2 tablets at 11 AM, 1 tablet at 3 PM, 1 tablet at 7 PM (increased last visit) Carbidopa/levodopa 50/200 at bedtime    ALLERGIES:   Allergies  Allergen Reactions  . Losartan Potassium Other (See Comments)    Not known  . Naproxen Nausea And Vomiting  . Penicillins Rash    Did it involve swelling of the face/tongue/throat, SOB, or low  BP? No Did it involve sudden or severe rash/hives, skin peeling, or any reaction on the inside of your mouth or nose? Yes Did you need to seek medical attention at a hospital or doctor's office? Unknown When did it last happen? If all above answers are "NO", may proceed with cephalosporin use.  TOLERATED CEFTRIAXONE 10/07/2018    CURRENT MEDICATIONS:  Outpatient Encounter Medications as of 11/15/2020  Medication Sig  . Acetaminophen (TYLENOL EXTRA STRENGTH PO) Take 1-2 tablets by mouth daily as needed (for back pain).  . Ascorbic Acid (VITAMIN C) 1000 MG tablet Take 1,000 mg by mouth daily.  Marland Kitchen aspirin EC 81 MG tablet Take 81 mg by mouth daily. Swallow whole.  . B Complex-C-Folic Acid (FOLBEE PLUS) TABS TAKE 1 TABLET BY MOUTH EVERY DAY  . carbidopa-levodopa (SINEMET CR) 50-200 MG tablet Take 1 tablet by mouth at bedtime.  . carbidopa-levodopa (SINEMET) 25-100 MG tablet Take 2 at 7am, 2 at 11am, 1 at 3pm, 1 at 7pm  . Cholecalciferol (VITAMIN D3) 125 MCG (5000 UT) TBDP Take 1 tablet by mouth daily.   . furosemide (LASIX) 40 MG tablet TAKE 1 TABLET BY MOUTH EVERY DAY  . levothyroxine (SYNTHROID) 50 MCG tablet TAKE 1 TABLET BY MOUTH DAILY BEFORE BREAKFAST  . pantoprazole (PROTONIX) 40 MG tablet TAKE 1 TABLET BY MOUTH TWICE A DAY  . simvastatin (ZOCOR) 20 MG tablet TAKE 1 TABLET BY MOUTH EVERYDAY AT BEDTIME  . tamsulosin (  FLOMAX) 0.4 MG CAPS capsule TAKE 1 CAPSULE BY MOUTH EVERY DAY  . vitamin B-12 (CYANOCOBALAMIN) 500 MCG tablet Take 500 mcg by mouth daily.  Marland Kitchen zinc sulfate 220 (50 Zn) MG capsule Take 220 mg by mouth daily.  . hydrOXYzine (ATARAX/VISTARIL) 10 MG tablet TAKE 1 TABLET BY MOUTH AT BEDTIME AS NEEDED (Patient not taking: Reported on 11/15/2020)  . lisinopril (ZESTRIL) 2.5 MG tablet TAKE 1 TABLET BY MOUTH EVERY DAY (Patient not taking: Reported on 11/15/2020)  . nitroGLYCERIN (NITROSTAT) 0.4 MG SL tablet Place 1 tablet (0.4 mg total) under the tongue every 5 (five) minutes as  needed for chest pain. (Patient not taking: Reported on 11/15/2020)  . [DISCONTINUED] predniSONE (DELTASONE) 20 MG tablet Take 1 tablet by mouth daily for 5 days, then 1/2 tablet daily for 2 days  . [DISCONTINUED] valACYclovir (VALTREX) 1000 MG tablet Take 1 tablet (1,000 mg total) by mouth 3 (three) times daily. For 7 days for shingles.   No facility-administered encounter medications on file as of 11/15/2020.    Objective:   PHYSICAL EXAMINATION:    VITALS:   Vitals:   11/15/20 1424  BP: (!) 110/58  Pulse: (!) 54  SpO2: 94%  Weight: 196 lb (88.9 kg)  Height: 6\' 1"  (1.854 m)    GEN:  The patient appears stated age and is in NAD. HEENT:  Normocephalic.  There is erythema and swelling around the eye (much better than in the pic daughter showed me).  The lesions are scabbed over and not weeiping.    The mucous membranes are moist. The superficial temporal arteries are without ropiness or tenderness. CV:  Loletha Grayer.  regular Lungs:  CTAB Neck/HEME:  There are no carotid bruits bilaterally.  Neurological examination:  Orientation: The patient is alert and oriented x3. Cranial nerves: There is good facial symmetry with facial hypomimia. The speech is fluent and dysarthric. Soft palate rises symmetrically and there is no tongue deviation. Hearing is intact to conversational tone. Sensation: Sensation is intact to light touch throughout Motor: Strength is at least antigravity x4.  Movement examination: Tone: There is mod increased tone in the bilateral UE Abnormal movements: none Coordination:  There is mild decremation with RAM's, in the UE bilaterally Gait and Station: not tested    I have reviewed and interpreted the following labs independently    Chemistry      Component Value Date/Time   NA 136 11/02/2020 1032   NA 141 11/17/2019 1124   K 3.9 11/02/2020 1032   CL 99 11/02/2020 1032   CO2 27 11/02/2020 1032   BUN 50 (H) 11/02/2020 1032   BUN 30 (H) 11/17/2019 1124    CREATININE 1.62 (H) 11/02/2020 1032   CREATININE 1.59 (H) 06/08/2020 1538   GLU 77 05/23/2019 0000      Component Value Date/Time   CALCIUM 8.9 11/02/2020 1032   ALKPHOS 69 11/02/2020 1032   AST 41 (H) 11/02/2020 1032   ALT 7 11/02/2020 1032   BILITOT 0.5 11/02/2020 1032       Lab Results  Component Value Date   WBC 6.2 11/02/2020   HGB 9.9 (L) 11/02/2020   HCT 28.7 (L) 11/02/2020   MCV 104.2 (H) 11/02/2020   PLT 184.0 11/02/2020    Lab Results  Component Value Date   TSH 4.59 (H) 08/14/2020     Total time spent on today's visit was 20 minutes, including both face-to-face time and nonface-to-face time.  Time included that spent on review of records (prior  notes available to me/labs/imaging if pertinent), discussing treatment and goals, answering patient's questions and coordinating care.  Cc:  [name], MD

## 2020-11-14 ENCOUNTER — Encounter: Payer: Self-pay | Admitting: Family Medicine

## 2020-11-15 ENCOUNTER — Ambulatory Visit: Payer: Medicare PPO | Admitting: Neurology

## 2020-11-15 ENCOUNTER — Other Ambulatory Visit (INDEPENDENT_AMBULATORY_CARE_PROVIDER_SITE_OTHER): Payer: Medicare PPO

## 2020-11-15 ENCOUNTER — Encounter: Payer: Self-pay | Admitting: Neurology

## 2020-11-15 ENCOUNTER — Other Ambulatory Visit: Payer: Self-pay

## 2020-11-15 VITALS — BP 110/58 | HR 54 | Ht 73.0 in | Wt 196.0 lb

## 2020-11-15 DIAGNOSIS — G20 Parkinson's disease: Secondary | ICD-10-CM | POA: Diagnosis not present

## 2020-11-15 DIAGNOSIS — D649 Anemia, unspecified: Secondary | ICD-10-CM

## 2020-11-15 LAB — CBC WITH DIFFERENTIAL/PLATELET
Basophils Absolute: 0.1 10*3/uL (ref 0.0–0.1)
Basophils Relative: 0.7 % (ref 0.0–3.0)
Eosinophils Absolute: 0.2 10*3/uL (ref 0.0–0.7)
Eosinophils Relative: 2.6 % (ref 0.0–5.0)
HCT: 29.9 % — ABNORMAL LOW (ref 39.0–52.0)
Hemoglobin: 10.4 g/dL — ABNORMAL LOW (ref 13.0–17.0)
Lymphocytes Relative: 19 % (ref 12.0–46.0)
Lymphs Abs: 1.7 10*3/uL (ref 0.7–4.0)
MCHC: 34.7 g/dL (ref 30.0–36.0)
MCV: 106 fl — ABNORMAL HIGH (ref 78.0–100.0)
Monocytes Absolute: 1 10*3/uL (ref 0.1–1.0)
Monocytes Relative: 10.9 % (ref 3.0–12.0)
Neutro Abs: 5.9 10*3/uL (ref 1.4–7.7)
Neutrophils Relative %: 66.8 % (ref 43.0–77.0)
Platelets: 301 10*3/uL (ref 150.0–400.0)
RBC: 2.82 Mil/uL — ABNORMAL LOW (ref 4.22–5.81)
RDW: 13.8 % (ref 11.5–15.5)
WBC: 8.8 10*3/uL (ref 4.0–10.5)

## 2020-11-15 MED ORDER — CARBIDOPA-LEVODOPA 25-100 MG PO TABS: ORAL_TABLET | ORAL | 2 refills | Status: DC

## 2020-11-15 MED ORDER — CARBIDOPA-LEVODOPA ER 50-200 MG PO TBCR: 1.0000 | EXTENDED_RELEASE_TABLET | Freq: Every day | ORAL | 2 refills | Status: DC

## 2020-11-15 NOTE — Patient Instructions (Signed)
T-Shirt design contest   SUBMIT UNIQUE DESIGN TO WIN!  Winning design will be featured on T-shirts to support our local Parkinson's patients.   Contest rules and information in on link below .   Contest ends on August 31,2022    How to [employer] your own unique quote or a design that will stand out and support the ideas behind Parkinson's Awareness  This contest is open to everyone. The winning design or quote will be selected by a group of unbiased judges. Judges will vote based on quality, relevance, and aesthetic. Judges will not have access to the names associated with the submission. Submissions accepted until: February 20, 2021 What You Win Your own quote or design will be printed on a t-shirt that will support Parkinson's Awareness!! Feel good knowing your quote/artwork will help spread awareness for Parkinson's contribute and 100% of profits to the local area . Tips for Submissions We will accept submissions in the form of quotes, Financial risk analyst, or hand drawn artwork.  Mission/Theme: The Power of One ! Working together to support and treat to improve quality of life for Parkinson's Patients Rules & Regulations All designs and quotes must be original and not subject to copyright of another person or entity. Distribution and Reproduction Rights for all quotes and artwork will be transferred to [facility] [employer] upon submission to the contest via the [employer] form on February 20, 2021  Online Resources for Power over Parkinson's Group May 2022  . Local [place] Online Groups  o Power over [employer] Group :   - Power Over Parkinson's Patient Education Group will be Wednesday, May 11th at 2pm via Zoom.   - Upcoming Power over Parkinson's Meetings:  2nd Wednesdays of the month at 2 pm:  June 8th, July 13th - Contact Amy Marriott at [email or web address] if interested in participating in this online group o Parkinson's Care Partners Group:     3rd Mondays, Contact Misty Paladino o Atypical Parkinsonian Patient Group:   4th Wednesdays, Contact Misty Paladino o If you are interested in participating in these online groups with Misty, please contact her directly for how to join those meetings.  Her contact information is [email or web address].   . [place]:  [email or web address] o PD Health at Home continues:  Mindfulness Mondays, Expert Briefing Tuesdays, Wellness Wednesdays, Take Time Thursdays, Fitness Fridays -Listings for May 2022 are on the website o Upcoming Webinar:  Newly Diagnosed Building a Better Life with Parkinson   Wednesday, May 18th @ 1 pm o Register for Armed forces operational officer) at [email or web address] o  Please check out their website to sign up for emails and see their full online offerings  . [place]:  [email or web address]  o Upcoming Webinar:   What's in your DNA, understanding Parkinson's genetics.  Thursday, May 19th @ 12 noon o Check out additional information on their website to see their full online offerings  . [place]:  [email or web address] o Upcoming Webinar:  Stay tuned o Care Partner Monthly Meetup.  With [place].  First Tuesday of each month, 2 pm o Joy Breaks:  First Wednesday of each month, 2-3 pm. There will be art, doodling, making, crafting, listening, laughing, stories, and everything in between. No art experience necessary. No supplies required. Just show up for joy!  Register on their website. o Check out additional information to Live Well Today on their website  . Parkinson and Movement Disorders (PMD) Alliance:  www.pmdalliance.org o [employer]:  Saturday  Events o Sign up for emails, which are sent weekly to give you updates on programming and online offerings     . Parkinson's Association of the [place]:  www.parkinsonassociation.org o Information on online support groups,  education events, and online exercises including Yoga, Parkinson's exercises and more-LOTS of information on links to PD resources and online events o Virtual Support Group through Parkinson's Association of the [place]; next one is scheduled for Wednesday, May 4th, 2022 at 2 pm. (These are typically scheduled for the 1st Wednesday of the month at 2 pm).  Visit website for details.  . Additional links for movement activities: o PWR! Moves Classes at [place] RESUMED!  Wednesdays 10 and 11 am.  Contact Amy Marriott, PT [email or web address] or [phone] if interested o Here is a link to the PWR!Moves classes on Zoom from [place] - Daily Mon-Sat at 10:00. Via Zoom, FREE and open to all.  There is also a link below via Facebook if you use that platform. - [email or web address].si - [email or web address]://[email or web address].no o Parkinson's Wellness Recovery (PWR! Moves)  www.[email or web address] - Info on the PWR! Virtual Experience:  You will have access to our expertise through self-assessment, guided plans that start with the PD-specific fundamentals, educational content, tips, Q&A with an expert, and a growing Art therapist of PD-specific pre-recorded and live exercise classes of varying types and intensity - both physical and cognitive! If that is not enough, we offer 1:1 wellness consultations (in-person or virtual) to personalize your PWR! Research scientist (medical).  - Check out the PWR! Move of the month on the [place] [facility] website:  [email or web address] o [employer] Fridays:  - As part of the PD Health @ Home program, this free video series focuses each week on one aspect of fitness designed to support people living with Parkinson's.  These weekly  videos highlight the [place] recent fitness guidelines for people with Parkinson's disease. -  [email or web address].dk o Dance for PD website is offering free, live-stream classes throughout the week, as well as links to [employer] of classes:  [email or web address] o Dance for Parkinson's Class:  [place].  Free offering for people with Parkinson's and care partners; virtual class.  o For more information, contact [phone] or email Ruffin Frederick at [email or web address] o Virtual dance and Pilates for Parkinson's classes: Click on the Community Tab> Parkinson's Movement Initiative Tab.  To register for classes and for more information, visit [email or web address] and click the "community" tab.     o [employer] Parkinson's Cycling Classes  - [facility] [employer]: 1pm on Fridays-Live classes at [employer] ([employer] at [email or web address] or [phone]) [facility] [employer]: Virtual Classes Mondays and Thursdays Jeanette Caprice classes Tuesday, Wednesday and Thursday (contact [place] at [email or web address]  or [phone])  o Reading levels of classes are offered Tuesdays and Thursdays:  10:30 am,  12 noon & 1:45 pm at [facility].  - Active Stretching with Paula Compton Class starting in March, on Fridays - To observe a class or for  more information, call [phone] or email [email or web address] . [facility]: o Chief Technology Officer Opportunities:  www.well-[email or web address]/caregiver-education/[email or web address]-support-group.  You may also contact Vickki Muff at [email or web address] or [phone].   o [facility] for Family Caregivers! Thursday, May 12th 10:00a-1:30p [place] 07-20-1996, [employer], [place], [place] You may contact [address] at  [email or web address] or [phone].   o Well-Spring Navigator:  Just1Navigator program, a free service to help individuals and  families through the journey of determining care for older adults.  The "Navigator" is a Education officer, museum, Arnell Asal, who will speak with a prospective client and/or loved ones to provide an assessment of the situation and a set of recommendations for a personalized care plan -- all free of charge, and whether [facility] [employer] offers the needed service or not. If the need is not a service we provide, we are well-connected with reputable programs in town that we can refer you to.  [email or web address] or to speak with the Navigator, call [phone].

## 2020-11-20 ENCOUNTER — Other Ambulatory Visit: Payer: Self-pay

## 2020-11-20 DIAGNOSIS — D539 Nutritional anemia, unspecified: Secondary | ICD-10-CM

## 2020-11-21 ENCOUNTER — Encounter: Payer: Self-pay | Admitting: Family Medicine

## 2020-11-26 ENCOUNTER — Other Ambulatory Visit: Payer: Self-pay

## 2020-11-26 ENCOUNTER — Ambulatory Visit (INDEPENDENT_AMBULATORY_CARE_PROVIDER_SITE_OTHER): Payer: Medicare PPO | Admitting: Podiatry

## 2020-11-26 DIAGNOSIS — M79674 Pain in right toe(s): Secondary | ICD-10-CM

## 2020-11-26 DIAGNOSIS — I739 Peripheral vascular disease, unspecified: Secondary | ICD-10-CM | POA: Diagnosis not present

## 2020-11-26 DIAGNOSIS — B351 Tinea unguium: Secondary | ICD-10-CM | POA: Diagnosis not present

## 2020-11-26 DIAGNOSIS — M79675 Pain in left toe(s): Secondary | ICD-10-CM

## 2020-11-26 NOTE — Progress Notes (Signed)
Subjective: 87 y.o. returns the office today for painful, elongated, thickened toenails which he cannot trim himself.Denies any acute changes since last appointment and no new complaints today. Denies any systemic complaints such as fevers, chills, nausea, vomiting.   PCP: [name], MD  Objective: AAO 3, NAD DP/PT pulses palpable 1/4, CRT less than 3 seconds-chronic bilateral lower extremity edema is present.  No significant change Nails hypertrophic, dystrophic, elongated, brittle, discolored 10. There is tenderness overlying the nails 1-5 bilaterally. There is no surrounding erythema or drainage along the nail sites. No open lesions or pre-ulcerative lesions are identified. No pain with calf compression, warmth, erythema.  Assessment: Patient presents with symptomatic onychomycosis; chronic edema/venous insufficiency  Plan: -Treatment options including alternatives, risks, complications were discussed -Nails sharply debrided 10 without complication/bleeding. -Discussed daily foot inspection. If there are any changes, to call the office immediately.  -Follow-up in 3 months or sooner if any problems are to arise. In the meantime, encouraged to call the office with any questions, concerns, changes symptoms.  Celesta Gentile, DPM

## 2020-12-17 ENCOUNTER — Ambulatory Visit (INDEPENDENT_AMBULATORY_CARE_PROVIDER_SITE_OTHER): Payer: Medicare PPO | Admitting: Family Medicine

## 2020-12-17 ENCOUNTER — Encounter: Payer: Self-pay | Admitting: Family Medicine

## 2020-12-17 ENCOUNTER — Other Ambulatory Visit: Payer: Self-pay

## 2020-12-17 VITALS — BP 130/68 | HR 62 | Temp 98.7°F | Ht 73.0 in | Wt 203.6 lb

## 2020-12-17 DIAGNOSIS — E034 Atrophy of thyroid (acquired): Secondary | ICD-10-CM

## 2020-12-17 DIAGNOSIS — I10 Essential (primary) hypertension: Secondary | ICD-10-CM

## 2020-12-17 DIAGNOSIS — Z0000 Encounter for general adult medical examination without abnormal findings: Secondary | ICD-10-CM

## 2020-12-17 DIAGNOSIS — D539 Nutritional anemia, unspecified: Secondary | ICD-10-CM

## 2020-12-17 DIAGNOSIS — Z79899 Other long term (current) drug therapy: Secondary | ICD-10-CM

## 2020-12-17 LAB — CBC WITH DIFFERENTIAL/PLATELET
Basophils Absolute: 0.1 10*3/uL (ref 0.0–0.1)
Basophils Relative: 0.7 % (ref 0.0–3.0)
Eosinophils Absolute: 0.2 10*3/uL (ref 0.0–0.7)
Eosinophils Relative: 2.4 % (ref 0.0–5.0)
HCT: 30.9 % — ABNORMAL LOW (ref 39.0–52.0)
Hemoglobin: 10.8 g/dL — ABNORMAL LOW (ref 13.0–17.0)
Lymphocytes Relative: 15.5 % (ref 12.0–46.0)
Lymphs Abs: 1.3 10*3/uL (ref 0.7–4.0)
MCHC: 35 g/dL (ref 30.0–36.0)
MCV: 107.3 fl — ABNORMAL HIGH (ref 78.0–100.0)
Monocytes Absolute: 1.1 10*3/uL — ABNORMAL HIGH (ref 0.1–1.0)
Monocytes Relative: 13.2 % — ABNORMAL HIGH (ref 3.0–12.0)
Neutro Abs: 5.9 10*3/uL (ref 1.4–7.7)
Neutrophils Relative %: 68.2 % (ref 43.0–77.0)
Platelets: 272 10*3/uL (ref 150.0–400.0)
RBC: 2.88 Mil/uL — ABNORMAL LOW (ref 4.22–5.81)
RDW: 15.6 % — ABNORMAL HIGH (ref 11.5–15.5)
WBC: 8.6 10*3/uL (ref 4.0–10.5)

## 2020-12-17 LAB — COMPREHENSIVE METABOLIC PANEL
ALT: 5 U/L (ref 0–53)
AST: 24 U/L (ref 0–37)
Albumin: 3.7 g/dL (ref 3.5–5.2)
Alkaline Phosphatase: 78 U/L (ref 39–117)
BUN: 29 mg/dL — ABNORMAL HIGH (ref 6–23)
CO2: 31 mEq/L (ref 19–32)
Calcium: 9.2 mg/dL (ref 8.4–10.5)
Chloride: 95 mEq/L — ABNORMAL LOW (ref 96–112)
Creatinine, Ser: 1.45 mg/dL (ref 0.40–1.50)
GFR: 43.95 mL/min — ABNORMAL LOW (ref >60.00)
Glucose, Bld: 87 mg/dL (ref 70–99)
Potassium: 4.1 mEq/L (ref 3.5–5.1)
Sodium: 134 mEq/L — ABNORMAL LOW (ref 135–145)
Total Bilirubin: 0.4 mg/dL (ref 0.2–1.2)
Total Protein: 7 g/dL (ref 6.0–8.3)

## 2020-12-17 LAB — VITAMIN B12: Vitamin B-12: >1550 pg/mL — ABNORMAL HIGH (ref 211–911)

## 2020-12-17 LAB — TSH: TSH: 3.47 u[IU]/mL (ref 0.35–4.50)

## 2020-12-17 MED ORDER — CEPHALEXIN 500 MG PO CAPS: 500.0000 mg | ORAL_CAPSULE | Freq: Three times a day (TID) | ORAL | 0 refills | Status: DC

## 2020-12-17 NOTE — Progress Notes (Signed)
Phone: [phone]   Subjective:  Patient presents today for their annual physical. Chief complaint-noted.   See problem oriented charting- ROS- full  review of systems was completed and negative  except for: fatigue, le g swelling and redness, weakness  The following were reviewed and entered/updated in epic: Past Medical History:  Diagnosis Date   Acute myocardial infarction of inferoposterior wall, subsequent episode of care [facility])    CAD (coronary artery disease)    Encounter for long-term (current) use of other medications    Family history of malignant neoplasm of gastrointestinal tract    GERD (gastroesophageal reflux disease)    Hypercholesterolemia    Hypertension    Hypertrophy of prostate with urinary obstruction and other lower urinary tract symptoms (LUTS)    Personal history of thrombophlebitis    PVD (peripheral vascular disease) (HCC)    Respiratory failure (HCC)    Unspecified adverse effect of unspecified drug, medicinal and biological substance    Unspecified hypothyroidism    Patient Active Problem List   Diagnosis Date Noted   Duodenal ulcer     Priority: High   MSSA bacteremia 04/25/2019    Priority: High   Cellulitis of right leg 04/20/2019    Priority: High   Parkinson's disease ([place]) 04/13/2018    Priority: High   CAD w/ hx MI s/p CABG 09/07/2012    Priority: High   Cardiomyopathy, ischemic 03/22/2010    Priority: High   Atherosclerosis of aorta ([place]) 08/16/2019    Priority: Medium   Low back pain 04/19/2015    Priority: Medium   CKD (chronic kidney disease), stage III ([place]) 04/20/2014    Priority: Medium   Anxiety state 04/17/2014    Priority: Medium   HYPERCHOLESTEROLEMIA 10/31/2008    Priority: Medium   BPH (benign prostatic hyperplasia) 09/28/2007    Priority: Medium   Hypothyroidism 03/26/2007    Priority: Medium   Essential hypertension 03/22/2007    Priority: Medium   Senile purpura ([place]) 04/13/2018    Priority: Low   ANEMIA,  VITAMIN B12 DEFICIENCY 04/22/2010    Priority: Low   Actinic keratosis 06/25/2009    Priority: Low   BASAL CELL CARCINOMA, NOSE 02/05/2009    Priority: Low   Localized osteoarthrosis, lower leg 12/06/2008    Priority: Low   GERD 03/22/2007    Priority: Low   Chronic diastolic CHF (congestive heart failure), NYHA class 2 ([place]) 08/14/2020   Onychomycosis 12/20/2019   Dehydration 04/20/2019   Generalized weakness 04/20/2019   Cellulitis of left foot    Cellulitis of left leg 10/07/2018   Insomnia 04/13/2018   Past Surgical History:  Procedure Laterality Date   BIOPSY  04/28/2019   Procedure: BIOPSY;  Surgeon: Thornton Park, MD;  Location: [place];  Service: Gastroenterology;;   CATARACT EXTRACTION, BILATERAL     CHOLECYSTECTOMY  02/26/2012   Procedure: LAPAROSCOPIC CHOLECYSTECTOMY WITH INTRAOPERATIVE CHOLANGIOGRAM;  Surgeon: Madilyn Hook, DO;  Location: [place];  Service: General;  Laterality: N/A;   CORONARY ARTERY BYPASS GRAFT     x 5 on August 29, 2008   ESOPHAGOGASTRODUODENOSCOPY (EGD) WITH PROPOFOL N/A 04/28/2019   Procedure: ESOPHAGOGASTRODUODENOSCOPY (EGD) WITH PROPOFOL;  Surgeon: Thornton Park, MD;  Location: [place];  Service: Gastroenterology;  Laterality: N/A;   REPLACEMENT TOTAL KNEE Left    tachecostomy placement     after heart attack   TONSILLECTOMY      Family History  Problem Relation Age of Onset   Heart disease Mother    Tuberculosis  Father    Colon cancer Brother     Medications- reviewed and updated Current Outpatient Medications  Medication Sig Dispense Refill   Acetaminophen (TYLENOL EXTRA STRENGTH PO) Take 1-2 tablets by mouth daily as needed (for back pain).     Ascorbic Acid (VITAMIN C) 1000 MG tablet Take 1,000 mg by mouth daily.     aspirin EC 81 MG tablet Take 81 mg by mouth daily. Swallow whole.     B Complex-C-Folic Acid (FOLBEE PLUS) TABS TAKE 1 TABLET BY MOUTH EVERY DAY 90 tablet 1   carbidopa-levodopa (SINEMET CR) 50-200 MG  tablet Take 1 tablet by mouth at bedtime. 90 tablet 2   carbidopa-levodopa (SINEMET) 25-100 MG tablet Take 2 at 7am, 2 at 11am, 1 at 3pm, 1 at 7pm 540 tablet 2   cephALEXin (KEFLEX) 500 MG capsule Take 1 capsule (500 mg total) by mouth 3 (three) times daily for 10 days. 30 capsule 0   Cholecalciferol (VITAMIN D3) 125 MCG (5000 UT) TBDP Take 1 tablet by mouth daily.      furosemide (LASIX) 40 MG tablet TAKE 1 TABLET BY MOUTH EVERY DAY 90 tablet 2   levothyroxine (SYNTHROID) 50 MCG tablet TAKE 1 TABLET BY MOUTH DAILY BEFORE BREAKFAST 90 tablet 1   pantoprazole (PROTONIX) 40 MG tablet TAKE 1 TABLET BY MOUTH TWICE A DAY 180 tablet 1   simvastatin (ZOCOR) 20 MG tablet TAKE 1 TABLET BY MOUTH EVERYDAY AT BEDTIME 90 tablet 3   tamsulosin (FLOMAX) 0.4 MG CAPS capsule TAKE 1 CAPSULE BY MOUTH EVERY DAY 90 capsule 3   vitamin B-12 (CYANOCOBALAMIN) 500 MCG tablet Take 500 mcg by mouth daily.     zinc sulfate 220 (50 Zn) MG capsule Take 220 mg by mouth daily.     hydrOXYzine (ATARAX/VISTARIL) 10 MG tablet TAKE 1 TABLET BY MOUTH AT BEDTIME AS NEEDED (Patient not taking: No sig reported) 90 tablet 1   lisinopril (ZESTRIL) 2.5 MG tablet TAKE 1 TABLET BY MOUTH EVERY DAY (Patient not taking: No sig reported) 90 tablet 3   nitroGLYCERIN (NITROSTAT) 0.4 MG SL tablet Place 1 tablet (0.4 mg total) under the tongue every 5 (five) minutes as needed for chest pain. (Patient not taking: No sig reported) 20 tablet 1   No current facility-administered medications for this visit.    Allergies-reviewed and updated Allergies  Allergen Reactions   Losartan Potassium Other (See Comments)    Not known   Naproxen Nausea And Vomiting   Penicillins Rash    Did it involve swelling of the face/tongue/throat, SOB, or low BP? No Did it involve sudden or severe rash/hives, skin peeling, or any reaction on the inside of your mouth or nose? Yes Did you need to seek medical attention at a hospital or doctor's office? Unknown When did  it last happen?       If all above answers are "NO", may proceed with cephalosporin use.  TOLERATED CEFTRIAXONE 10/07/2018    Social History   Social History Narrative   Married 1958. 2 kids (boy, girl). 2 grandkids (boy and girl).       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   Objective  Objective:  BP 130/68   Pulse 62   Temp 98.7 F (37.1 C) (Temporal)   Ht 6\' 1"  (1.854  m)   Wt 203 lb 9.6 oz (92.4 kg)   SpO2 95%   BMI 26.86 kg/m  Gen: NAD, resting comfortably HEENT: Mucous membranes are moist. Oropharynx normal Neck: no thyromegaly CV: RRR no murmurs rubs or gallops Lungs: CTAB no crackles, wheeze, rhonchi Abdomen: soft/nontender/nondistended/normal bowel sounds. No rebound or guarding.  Ext: 1+ edema- on right leg more intense eyrthema with some warmth to touch Skin: warm, dry Neuro: grossly normal, moves all extremities, PERRLA    Assessment and Plan  87 y.o. male presenting for annual physical.  Health Maintenance counseling: 1. Anticipatory guidance: Patient counseled regarding regular dental exams -q6 months, eye exams -yearly or more often- appt friday,  avoiding smoking and second hand smoke , limiting alcohol to 2 beverages per day - does not drink.   2. Risk factor reduction:  Advised patient of need for regular exercise and diet rich and fruits and vegetables to reduce risk of heart attack and stroke. Exercise- exercise bar that he does several days a week- encouraged to do regularly and do something for feet- still able to walk with son with belt. Diet- reasonably healthy -periods where food may not taste as good.  Wt Readings from Last 3 Encounters:  12/17/20 203 lb 9.6 oz (92.4 kg)  11/15/20 196 lb (88.9 kg)  11/02/20 201 lb 12.8 oz (91.5 kg)  3. Immunizations/screenings/ancillary studies- declines covid vaccination.  With feeling ill hold off on prevnar 20 for now. Wants to hold off on shingrix- also had shingles in may.  Immunization History  Administered Date(s) Administered   Fluad Quad(high Dose 65+) 04/23/2019   Influenza Split 03/28/2011, 03/25/2012   Influenza Whole 06/23/2001, 03/26/2007, 03/24/2008, 04/04/2009, 03/13/2010   Influenza, High Dose Seasonal PF 04/08/2016, 04/09/2017, 04/13/2018   Influenza,inj,Quad PF,6+ Mos 03/15/2013, 03/13/2014, 03/21/2015   Pneumococcal Conjugate-13 10/17/2014   Pneumococcal Polysaccharide-23 06/23/2005   Td 06/23/2005  4. Prostate cancer screening- past age based screening recommendations   5. Colon cancer screening - past age based screening recommendations 6. Skin cancer screening- does not see dermatology. advised regular sunscreen use. Denies worrisome, changing, or new skin lesions.  7. Never smoker 8. STD screening - not active  Status of chronic or acute concerns   #Patient microcytic and anemic-have ordered RBC, B12, methylmalonic acid and pathology smear review.  We will do these in addition to TSH repeat Lab Results  Component Value Date   TSH 4.59 (H) 08/14/2020   #Recurrent cellulitis yesterday noted feeling more fatigued/tired. Family and paient noted increased redness primarily on the right lower extremity. Started keflex 500 mg one dose last night and one so far today with plan for 3x a day (does not have enough for 7 days)  #Parkinson's-following with Dr. Carles Collet.  Remains on Sinemet.  Limited walking with son-otherwise wheelchair-bound  #Hypothyroidism-mildly off at last visit-recheck TSH today on 50 mcg of levothyroxine  #Hypertension-blood pressure well controlled on lisinopril 2.5 mg primarily for ischemic cardiomyopathy  in past (has been off lately as BP was low with one episode of cellulitis) and Lasix 40 mg due to fluid overload history-takes 80 mg of more swollen -we are going to try to restart even half tablet of lisinopril as can be  helpful for his heart/ischemic cardiomyopathy  #Hyperlipidemia-has been well controlled on simvastatin 20 mg with last LDL at 44 in [facility] soon to repeat at this time Lab Results  Component Value Date   CHOL 99 08/14/2020   HDL 39.80 08/14/2020   LDLCALC 44 08/14/2020  LDLDIRECT 60.0 04/08/2016   TRIG 77.0 08/14/2020   CHOLHDL 2 08/14/2020   #GERD-history of duodenal ulcer-in February we opted to try once a day PPI did not work well and we opted to go back to twice a day Lab Results  Component Value Date   VITAMINB12 1,340 (H) 10/07/2018  - still taking b12 with high dose PPI- reduce dose to every other day perhaps if still high  Recommended follow up: Return in about 3 months (around 03/19/2021) for follow up- or sooner if needed. Future Appointments  Date Time Provider [place]  02/04/2021  3:15 PM Trula Slade, DPM [facility]  05/31/2021  2:30 PM Tat, Eustace Quail, DO [facility] None  08/01/2021  1:00 PM [facility] HEALTH COACH [facility]   Lab/Order associations: NOT fasting   ICD-10-CM   1. Preventative health care  Z00.00     2. Hypothyroidism due to acquired atrophy of thyroid  E03.4 TSH    3. Essential hypertension  I10 CBC with Differential/Platelet    Comprehensive metabolic panel    4. High risk medication use  Z79.899 B12     Meds ordered this encounter  Medications   cephALEXin (KEFLEX) 500 MG capsule    Sig: Take 1 capsule (500 mg total) by mouth 3 (three) times daily for 10 days.    Dispense:  30 capsule    Refill:  0   Return precautions advised.  Garret Reddish, MD

## 2020-12-17 NOTE — Addendum Note (Signed)
Addended by: Jacob Moores on: 12/17/2020 02:46 PM   Modules accepted: Orders

## 2020-12-17 NOTE — Patient Instructions (Addendum)
Keflex minimal 5 days but up to 10 days depending on how the leg looks and how he is feeling.  If new or worsening symptoms please let us know  Team please release TSH from today as well as labs ordered in may 31st- 4th test  -we are going to try to restart even half tablet of lisinopril as can be helpful for his heart/  Recommended follow up: Return in about 3 months (around 03/19/2021) for follow up- or sooner if needed.

## 2020-12-21 DIAGNOSIS — H10022 Other mucopurulent conjunctivitis, left eye: Secondary | ICD-10-CM | POA: Diagnosis not present

## 2020-12-21 LAB — METHYLMALONIC ACID, SERUM: Methylmalonic Acid, Quant: 304 nmol/L (ref 87–318)

## 2020-12-21 LAB — FOLATE RBC: RBC Folate: 1633 ng/mL RBC (ref >280)

## 2020-12-21 LAB — PATHOLOGIST SMEAR REVIEW

## 2020-12-21 NOTE — Progress Notes (Signed)
Pt has viewed result note

## 2021-01-06 ENCOUNTER — Other Ambulatory Visit: Payer: Self-pay | Admitting: Family Medicine

## 2021-01-18 ENCOUNTER — Encounter: Payer: Self-pay | Admitting: Family Medicine

## 2021-01-18 MED ORDER — CEPHALEXIN 500 MG PO CAPS: 500.0000 mg | ORAL_CAPSULE | Freq: Three times a day (TID) | ORAL | 0 refills | Status: AC

## 2021-02-04 ENCOUNTER — Ambulatory Visit: Payer: Medicare PPO | Admitting: Podiatry

## 2021-02-04 ENCOUNTER — Other Ambulatory Visit: Payer: Self-pay

## 2021-02-04 DIAGNOSIS — B351 Tinea unguium: Secondary | ICD-10-CM

## 2021-02-04 DIAGNOSIS — M79674 Pain in right toe(s): Secondary | ICD-10-CM

## 2021-02-04 DIAGNOSIS — M79675 Pain in left toe(s): Secondary | ICD-10-CM | POA: Diagnosis not present

## 2021-02-04 MED ORDER — CEPHALEXIN 500 MG PO CAPS: 500.0000 mg | ORAL_CAPSULE | Freq: Three times a day (TID) | ORAL | 0 refills | Status: DC

## 2021-02-07 NOTE — Progress Notes (Addendum)
Subjective: 87 y.o. returns the office today for painful, elongated, thickened toenails which he cannot trim himself. Denies any acute changes since last appointment and no new complaints today. Denies any systemic complaints such as fevers, chills, nausea, vomiting.   PCP: [name], MD  Objective: AAO 3, NAD DP/PT pulses palpable 1/4, CRT less than 3 seconds-chronic bilateral lower extremity edema is present.  No significant change Nails hypertrophic, dystrophic, elongated, brittle, discolored 10. There is tenderness overlying the nails 1-5 bilaterally. There is no surrounding erythema or drainage along the nail sites. No open lesions No pain with calf compression, warmth, erythema.  Assessment: Patient presents with symptomatic onychomycosis; chronic edema/venous insufficiency  Plan: -Treatment options including alternatives, risks, complications were discussed -Nails sharply debrided 10 without complication/bleeding. -Discussed daily foot inspection. If there are any changes, to call the office immediately.  -Follow-up in 3 months or sooner if any problems are to arise. In the meantime, encouraged to call the office with any questions, concerns, changes symptoms.  Celesta Gentile, DPM  *Addendum: At the time of the patient's visit there was a superficial area skin breakdown the dorsal aspect of right foot.  There is no drainage or pus.  He has chronic erythematous difficult to fully evaluate cellulitis.  Started on cephalexin.  Follow-up in 2 weeks.

## 2021-02-19 ENCOUNTER — Encounter: Payer: Self-pay | Admitting: Podiatry

## 2021-02-19 ENCOUNTER — Other Ambulatory Visit: Payer: Self-pay

## 2021-02-19 ENCOUNTER — Ambulatory Visit: Payer: Medicare PPO | Admitting: Podiatry

## 2021-02-19 DIAGNOSIS — I739 Peripheral vascular disease, unspecified: Secondary | ICD-10-CM

## 2021-02-19 DIAGNOSIS — L98491 Non-pressure chronic ulcer of skin of other sites limited to breakdown of skin: Secondary | ICD-10-CM | POA: Diagnosis not present

## 2021-02-19 NOTE — Progress Notes (Signed)
Subjective: 87 year old male presents with his daughter for follow-up evaluation of previously previously noted superficial skin breakdown on the right dorsal forefoot.  As he is chronic erythema is difficult to fully evaluate the erythema for any cellulitis so started him on Keflex.  Areas been doing much better.  No drainage or pus or open wounds today.  No fevers or chills.  Objective: AAO x3, NAD Vascular status unchanged.  Is chronic edema present bilaterally with chronic erythema.  Upon removal of slough today there was quite a bit of moisturizer on the dorsal aspect of the foot on the area of concern.  There is no significant skin breakdown identified today.  No fluctuation crepitation.  No malodor. No pain with calf compression, swelling, warmth, erythema  Assessment: Chronic edema with resolved superficial skin breakdown  Plan: -All treatment options discussed with the patient including all alternatives, risks, complications.  -Appears to have quite a bit of moisturizer on the foot as well as interdigitally which I cleaned off today.  Discussed with him drying thoroughly between the toes and not applying as much moisturizer.  Discussed trying at take socks off some to allow the feet to dry as well.  Monitor for any signs or symptoms of recurrence of infection or skin breakdown. -Patient encouraged to call the office with any questions, concerns, change in symptoms.   Trula Slade DPM

## 2021-02-20 ENCOUNTER — Other Ambulatory Visit: Payer: Self-pay | Admitting: Family Medicine

## 2021-02-28 DIAGNOSIS — I739 Peripheral vascular disease, unspecified: Secondary | ICD-10-CM | POA: Diagnosis not present

## 2021-02-28 DIAGNOSIS — I509 Heart failure, unspecified: Secondary | ICD-10-CM | POA: Diagnosis not present

## 2021-02-28 DIAGNOSIS — G8929 Other chronic pain: Secondary | ICD-10-CM | POA: Diagnosis not present

## 2021-02-28 DIAGNOSIS — I110 Hypertensive heart disease with heart failure: Secondary | ICD-10-CM | POA: Diagnosis not present

## 2021-02-28 DIAGNOSIS — G629 Polyneuropathy, unspecified: Secondary | ICD-10-CM | POA: Diagnosis not present

## 2021-02-28 DIAGNOSIS — G20 Parkinson's disease: Secondary | ICD-10-CM | POA: Diagnosis not present

## 2021-02-28 DIAGNOSIS — E785 Hyperlipidemia, unspecified: Secondary | ICD-10-CM | POA: Diagnosis not present

## 2021-02-28 DIAGNOSIS — E261 Secondary hyperaldosteronism: Secondary | ICD-10-CM | POA: Diagnosis not present

## 2021-02-28 DIAGNOSIS — E039 Hypothyroidism, unspecified: Secondary | ICD-10-CM | POA: Diagnosis not present

## 2021-03-01 ENCOUNTER — Encounter: Payer: Self-pay | Admitting: Family Medicine

## 2021-03-04 ENCOUNTER — Other Ambulatory Visit: Payer: Self-pay

## 2021-03-04 MED ORDER — CEPHALEXIN 500 MG PO CAPS: 500.0000 mg | ORAL_CAPSULE | Freq: Three times a day (TID) | ORAL | 0 refills | Status: DC

## 2021-03-19 ENCOUNTER — Encounter: Payer: Self-pay | Admitting: Family Medicine

## 2021-03-19 ENCOUNTER — Other Ambulatory Visit: Payer: Self-pay

## 2021-03-19 ENCOUNTER — Ambulatory Visit: Payer: Medicare PPO | Admitting: Family Medicine

## 2021-03-19 VITALS — BP 99/54 | HR 47 | Temp 97.8°F | Ht 73.0 in | Wt 197.0 lb

## 2021-03-19 DIAGNOSIS — I10 Essential (primary) hypertension: Secondary | ICD-10-CM | POA: Diagnosis not present

## 2021-03-19 DIAGNOSIS — K219 Gastro-esophageal reflux disease without esophagitis: Secondary | ICD-10-CM

## 2021-03-19 DIAGNOSIS — E7800 Pure hypercholesterolemia, unspecified: Secondary | ICD-10-CM | POA: Diagnosis not present

## 2021-03-19 DIAGNOSIS — E034 Atrophy of thyroid (acquired): Secondary | ICD-10-CM

## 2021-03-19 MED ORDER — CEPHALEXIN 500 MG PO CAPS: 500.0000 mg | ORAL_CAPSULE | Freq: Three times a day (TID) | ORAL | 0 refills | Status: DC

## 2021-03-19 NOTE — Progress Notes (Signed)
Phone [phone] In person visit   Subjective:   Zachary Decker is a 87 y.o. year old very pleasant male patient who presents for/with See problem oriented charting Chief Complaint  Patient presents with   Hypertension   Hyperlipidemia   Hypothyroidism    This visit occurred during the SARS-CoV-2 public health emergency.  Safety protocols were in place, including screening questions prior to the visit, additional usage of staff PPE, and extensive cleaning of exam room while observing appropriate contact time as indicated for disinfecting solutions.   Past Medical History-  Patient Active Problem List   Diagnosis Date Noted   Duodenal ulcer     Priority: 1.   MSSA bacteremia 04/25/2019    Priority: 1.   Cellulitis of right leg 04/20/2019    Priority: 1.   Parkinson's disease ([place]) 04/13/2018    Priority: 1.   CAD w/ hx MI s/p CABG 09/07/2012    Priority: 1.   Cardiomyopathy, ischemic 03/22/2010    Priority: 1.   Atherosclerosis of aorta ([place]) 08/16/2019    Priority: 2.   Low back pain 04/19/2015    Priority: 2.   CKD (chronic kidney disease), stage III ([place]) 04/20/2014    Priority: 2.   Anxiety state 04/17/2014    Priority: 2.   HYPERCHOLESTEROLEMIA 10/31/2008    Priority: 2.   BPH (benign prostatic hyperplasia) 09/28/2007    Priority: 2.   Hypothyroidism 03/26/2007    Priority: 2.   Essential hypertension 03/22/2007    Priority: 2.   Senile purpura ([place]) 04/13/2018    Priority: 3.   ANEMIA, VITAMIN B12 DEFICIENCY 04/22/2010    Priority: 3.   Actinic keratosis 06/25/2009    Priority: 3.   BASAL CELL CARCINOMA, NOSE 02/05/2009    Priority: 3.   Localized osteoarthrosis, lower leg 12/06/2008    Priority: 3.   GERD 03/22/2007    Priority: 3.   Chronic diastolic CHF (congestive heart failure), NYHA class 2 ([place]) 08/14/2020   Onychomycosis 12/20/2019   Dehydration 04/20/2019   Generalized weakness 04/20/2019   Cellulitis of left foot    Cellulitis of left  leg 10/07/2018   Insomnia 04/13/2018    Medications- reviewed and updated Current Outpatient Medications  Medication Sig Dispense Refill   Acetaminophen (TYLENOL EXTRA STRENGTH PO) Take 1-2 tablets by mouth daily as needed (for back pain).     Ascorbic Acid (VITAMIN C) 1000 MG tablet Take 1,000 mg by mouth daily.     aspirin EC 81 MG tablet Take 81 mg by mouth daily. Swallow whole.     B Complex-C-Folic Acid (FOLBEE PLUS) TABS TAKE 1 TABLET BY MOUTH EVERY DAY 90 tablet 1   carbidopa-levodopa (SINEMET CR) 50-200 MG tablet Take 1 tablet by mouth at bedtime. 90 tablet 2   carbidopa-levodopa (SINEMET) 25-100 MG tablet Take 2 at 7am, 2 at 11am, 1 at 3pm, 1 at 7pm 540 tablet 2   Cholecalciferol (VITAMIN D3) 125 MCG (5000 UT) TBDP Take 1 tablet by mouth daily.      furosemide (LASIX) 40 MG tablet TAKE 1 TABLET BY MOUTH EVERY DAY 90 tablet 2   hydrOXYzine (ATARAX/VISTARIL) 10 MG tablet TAKE 1 TABLET BY MOUTH AT BEDTIME AS NEEDED 90 tablet 1   levothyroxine (SYNTHROID) 50 MCG tablet TAKE 1 TABLET BY MOUTH DAILY BEFORE BREAKFAST 90 tablet 1   lisinopril (ZESTRIL) 2.5 MG tablet TAKE 1 TABLET BY MOUTH EVERY DAY 90 tablet 3   nitroGLYCERIN (NITROSTAT) 0.4 MG SL tablet Place 1  tablet (0.4 mg total) under the tongue every 5 (five) minutes as needed for chest pain. 20 tablet 1   pantoprazole (PROTONIX) 40 MG tablet TAKE 1 TABLET BY MOUTH TWICE A DAY 180 tablet 1   simvastatin (ZOCOR) 20 MG tablet TAKE 1 TABLET BY MOUTH EVERYDAY AT BEDTIME 90 tablet 3   tamsulosin (FLOMAX) 0.4 MG CAPS capsule TAKE 1 CAPSULE BY MOUTH EVERY DAY 90 capsule 3   vitamin B-12 (CYANOCOBALAMIN) 500 MCG tablet Take 500 mcg by mouth daily.     zinc sulfate 220 (50 Zn) MG capsule Take 220 mg by mouth daily.     cephALEXin (KEFLEX) 500 MG capsule Take 1 capsule (500 mg total) by mouth 3 (three) times daily. 42 capsule 0   No current facility-administered medications for this visit.     Objective:  BP (!) 99/54   Pulse (!) 47    Temp 97.8 F (36.6 C) (Temporal)   Ht 6\' 1"  (1.854 m)   Wt 197 lb (89.4 kg)   SpO2 96%   BMI 25.99 kg/m  Gen: NAD, resting comfortably CV: RRR no murmurs rubs or gallops Lungs: CTAB no crackles, wheeze, rhonchi Skin: warm, dry, ongoing venous stasis changes in bilateral lower legs-at least 1+ edema bilaterally worse on the right.  Edema is more significant on the right foot particularly in the distal portion of the other toes where there is some serosanguineous drainage/weeping.  I do not see any open active wounds at this time.  Some increased redness/warmth in this area-he is not overly tender to touch though. Neuro: Wheelchair-bound    Assessment and Plan   # Recurrent Cellulitis S:medication: Keflex 500 mg- 3x daily  Last visit patient had noted feeling more fatigue/tired - also had increased redness primarily on the right lower extremity.    Today he reports had to start back on Sunday- noted more weakness and more redness in the legs- some weeping in the right foot. Feeling better within a few doses- redness fades more slowly than weakness A/P: improving- continue current course- sent in longer course to have on hand.   -This appears to be isolated primarily to the right foot today.  Significant swelling and some serosanguineous drainage noted.  Needs significant time elevating this foot in particular and I would like for him to let this air dry if possible.  If not improving by the end of the week would like to see him back to recheck/reevaluate  #hyperlipidemia #ischemic cardiomyopathy- sedentary but no chest pain or shortness of breath S: Medication:Simvastatin 20 mg daily Lab Results  Component Value Date   CHOL 99 08/14/2020   HDL 39.80 08/14/2020   LDLCALC 44 08/14/2020   LDLDIRECT 60.0 04/08/2016   TRIG 77.0 08/14/2020   CHOLHDL 2 08/14/2020   A/P: HTN-Controlled very well. Continue current medications.  Cardiomyopathy appears stable- did discuss can reduce lisinopril  and lasix if BP running low during times of cellulitis  #hypothyroidism S: compliant On thyroid medication-levothyroxine 50 mcg daily Lab Results  Component Value Date   TSH 3.47 12/17/2020   A/P:Controlled. Continue current medications.   #hypertension S: medication: Lisinopril 2.5 mg daily and lasix 40 mg daily - restarted half tablet of lisinopril on last visit  Home readings #s: machine not registering BP Readings from Last 3 Encounters:  03/19/21 (!) 99/54  12/17/20 130/68  11/15/20 (!) 110/58  A/P: when feeling ill with cellulitis we discussed could take half a lisinopril and half a lasix during that time-if  still running low let me know- as starts to feel better can go back to full dose  # GERD S:Medication: Protonix 40 mg twice daily. Flare up with once a day B12 levels related to PPI use: Lab Results  Component Value Date   VITAMINB12 >1550 (H) 12/17/2020  A/P: reasonable control- continue current rx   #Parkinson's Disease-following with Dr. Carles Collet.  Remains on Sinemet.  Limited walking with son-otherwise wheelchair-bound  Recommended follow up: No follow-ups on file. Future Appointments  Date Time Provider [place]  04/08/2021  3:15 PM Trula Slade, DPM [facility]  05/31/2021  2:30 PM Tat, Eustace Quail, DO [facility] None  08/01/2021  1:00 PM [facility] HEALTH COACH [facility]   Lab/Order associations:   ICD-10-CM   1. Essential hypertension  I10     2. Gastroesophageal reflux disease without esophagitis  K21.9     3. Hypothyroidism due to acquired atrophy of thyroid  E03.4     4. HYPERCHOLESTEROLEMIA  E78.00       Meds ordered this encounter  Medications   cephALEXin (KEFLEX) 500 MG capsule    Sig: Take 1 capsule (500 mg total) by mouth 3 (three) times daily.    Dispense:  42 capsule    Refill:  0    I,Harris Phan,acting as a scribe for Garret Reddish, MD.,have documented all relevant documentation on the behalf of Garret Reddish, MD,as  directed by  Garret Reddish, MD while in the presence of Garret Reddish, MD.  I, Garret Reddish, MD, have reviewed all documentation for this visit. The documentation on 03/19/21 for the exam, diagnosis, procedures, and orders are all accurate and complete.   Return precautions advised.  Garret Reddish, MD

## 2021-03-19 NOTE — Patient Instructions (Addendum)
Declines Flu shot today.  You could start taking half of lisinopril and half of lasix. If you start to not feel good, please let me know and you may go back to taking full tablets.  We will plan to have labs done in your next visit.  Please keep  me updated in regards to your antibiotics.  I recommend trying to let your skin air dry during the day - hold off on any topical creams/lotions. I strongly advise that you start elevating your legs as well - extended hours per day is reasonable. I also think that after a couple of days on the antibiotics and leg elevation to help decrease swelling, compression stockings would be best for you also. If you are not improving by the end of the week, please update with me and come back for re-evaluation.  I think it is reasonable to continue washing your lower legs with soap and water.  Recommended follow up: Return in about 3 months (around 06/18/2021) for a follow-up or sooner if needed.

## 2021-03-21 ENCOUNTER — Emergency Department (HOSPITAL_COMMUNITY): Payer: Medicare PPO

## 2021-03-21 ENCOUNTER — Inpatient Hospital Stay (HOSPITAL_COMMUNITY)
Admission: EM | Admit: 2021-03-21 | Discharge: 2021-03-29 | DRG: 871 | Disposition: A | Discharge status: Hospice | Payer: Medicare PPO | Attending: Internal Medicine | Admitting: Internal Medicine

## 2021-03-21 ENCOUNTER — Encounter: Payer: Self-pay | Admitting: Family Medicine

## 2021-03-21 DIAGNOSIS — N179 Acute kidney failure, unspecified: Secondary | ICD-10-CM | POA: Diagnosis present

## 2021-03-21 DIAGNOSIS — I130 Hypertensive heart and chronic kidney disease with heart failure and stage 1 through stage 4 chronic kidney disease, or unspecified chronic kidney disease: Secondary | ICD-10-CM | POA: Diagnosis present

## 2021-03-21 DIAGNOSIS — Z951 Presence of aortocoronary bypass graft: Secondary | ICD-10-CM

## 2021-03-21 DIAGNOSIS — Z993 Dependence on wheelchair: Secondary | ICD-10-CM

## 2021-03-21 DIAGNOSIS — R338 Other retention of urine: Secondary | ICD-10-CM | POA: Diagnosis present

## 2021-03-21 DIAGNOSIS — G9341 Metabolic encephalopathy: Secondary | ICD-10-CM | POA: Diagnosis not present

## 2021-03-21 DIAGNOSIS — N170 Acute kidney failure with tubular necrosis: Secondary | ICD-10-CM | POA: Diagnosis not present

## 2021-03-21 DIAGNOSIS — G20 Parkinson's disease: Secondary | ICD-10-CM | POA: Diagnosis present

## 2021-03-21 DIAGNOSIS — R6521 Severe sepsis with septic shock: Secondary | ICD-10-CM | POA: Diagnosis not present

## 2021-03-21 DIAGNOSIS — R001 Bradycardia, unspecified: Secondary | ICD-10-CM | POA: Diagnosis not present

## 2021-03-21 DIAGNOSIS — G20A1 Parkinson's disease without dyskinesia, without mention of fluctuations: Secondary | ICD-10-CM | POA: Diagnosis present

## 2021-03-21 DIAGNOSIS — Z20822 Contact with and (suspected) exposure to covid-19: Secondary | ICD-10-CM | POA: Diagnosis present

## 2021-03-21 DIAGNOSIS — A4152 Sepsis due to Pseudomonas: Secondary | ICD-10-CM | POA: Diagnosis present

## 2021-03-21 DIAGNOSIS — I2510 Atherosclerotic heart disease of native coronary artery without angina pectoris: Secondary | ICD-10-CM | POA: Diagnosis present

## 2021-03-21 DIAGNOSIS — N401 Enlarged prostate with lower urinary tract symptoms: Secondary | ICD-10-CM | POA: Diagnosis present

## 2021-03-21 DIAGNOSIS — Z7189 Other specified counseling: Secondary | ICD-10-CM | POA: Diagnosis not present

## 2021-03-21 DIAGNOSIS — I517 Cardiomegaly: Secondary | ICD-10-CM | POA: Diagnosis not present

## 2021-03-21 DIAGNOSIS — Z66 Do not resuscitate: Secondary | ICD-10-CM | POA: Diagnosis not present

## 2021-03-21 DIAGNOSIS — Z888 Allergy status to other drugs, medicaments and biological substances status: Secondary | ICD-10-CM

## 2021-03-21 DIAGNOSIS — R4182 Altered mental status, unspecified: Secondary | ICD-10-CM

## 2021-03-21 DIAGNOSIS — E7800 Pure hypercholesterolemia, unspecified: Secondary | ICD-10-CM | POA: Diagnosis present

## 2021-03-21 DIAGNOSIS — A419 Sepsis, unspecified organism: Secondary | ICD-10-CM | POA: Diagnosis not present

## 2021-03-21 DIAGNOSIS — Z515 Encounter for palliative care: Secondary | ICD-10-CM | POA: Diagnosis not present

## 2021-03-21 DIAGNOSIS — E871 Hypo-osmolality and hyponatremia: Secondary | ICD-10-CM | POA: Diagnosis present

## 2021-03-21 DIAGNOSIS — Z8249 Family history of ischemic heart disease and other diseases of the circulatory system: Secondary | ICD-10-CM

## 2021-03-21 DIAGNOSIS — F419 Anxiety disorder, unspecified: Secondary | ICD-10-CM | POA: Diagnosis present

## 2021-03-21 DIAGNOSIS — B965 Pseudomonas (aeruginosa) (mallei) (pseudomallei) as the cause of diseases classified elsewhere: Secondary | ICD-10-CM | POA: Diagnosis present

## 2021-03-21 DIAGNOSIS — I739 Peripheral vascular disease, unspecified: Secondary | ICD-10-CM | POA: Diagnosis present

## 2021-03-21 DIAGNOSIS — N1832 Chronic kidney disease, stage 3b: Secondary | ICD-10-CM | POA: Diagnosis present

## 2021-03-21 DIAGNOSIS — I959 Hypotension, unspecified: Secondary | ICD-10-CM | POA: Diagnosis not present

## 2021-03-21 DIAGNOSIS — I5033 Acute on chronic diastolic (congestive) heart failure: Secondary | ICD-10-CM | POA: Diagnosis present

## 2021-03-21 DIAGNOSIS — I5043 Acute on chronic combined systolic (congestive) and diastolic (congestive) heart failure: Secondary | ICD-10-CM | POA: Diagnosis present

## 2021-03-21 DIAGNOSIS — L03115 Cellulitis of right lower limb: Secondary | ICD-10-CM | POA: Diagnosis present

## 2021-03-21 DIAGNOSIS — E162 Hypoglycemia, unspecified: Secondary | ICD-10-CM | POA: Diagnosis present

## 2021-03-21 DIAGNOSIS — E039 Hypothyroidism, unspecified: Secondary | ICD-10-CM | POA: Diagnosis present

## 2021-03-21 DIAGNOSIS — I255 Ischemic cardiomyopathy: Secondary | ICD-10-CM | POA: Diagnosis present

## 2021-03-21 DIAGNOSIS — Z8619 Personal history of other infectious and parasitic diseases: Secondary | ICD-10-CM

## 2021-03-21 DIAGNOSIS — Z79890 Hormone replacement therapy (postmenopausal): Secondary | ICD-10-CM

## 2021-03-21 DIAGNOSIS — L03119 Cellulitis of unspecified part of limb: Secondary | ICD-10-CM | POA: Diagnosis present

## 2021-03-21 DIAGNOSIS — R1310 Dysphagia, unspecified: Secondary | ICD-10-CM

## 2021-03-21 DIAGNOSIS — D6489 Other specified anemias: Secondary | ICD-10-CM | POA: Diagnosis present

## 2021-03-21 DIAGNOSIS — R079 Chest pain, unspecified: Secondary | ICD-10-CM | POA: Diagnosis not present

## 2021-03-21 DIAGNOSIS — I248 Other forms of acute ischemic heart disease: Secondary | ICD-10-CM | POA: Diagnosis present

## 2021-03-21 DIAGNOSIS — R410 Disorientation, unspecified: Secondary | ICD-10-CM | POA: Diagnosis not present

## 2021-03-21 DIAGNOSIS — I252 Old myocardial infarction: Secondary | ICD-10-CM

## 2021-03-21 DIAGNOSIS — R627 Adult failure to thrive: Secondary | ICD-10-CM | POA: Diagnosis present

## 2021-03-21 DIAGNOSIS — Z85828 Personal history of other malignant neoplasm of skin: Secondary | ICD-10-CM

## 2021-03-21 DIAGNOSIS — L03116 Cellulitis of left lower limb: Secondary | ICD-10-CM | POA: Diagnosis present

## 2021-03-21 DIAGNOSIS — R4020 Unspecified coma: Secondary | ICD-10-CM | POA: Diagnosis not present

## 2021-03-21 DIAGNOSIS — R6520 Severe sepsis without septic shock: Secondary | ICD-10-CM | POA: Diagnosis not present

## 2021-03-21 DIAGNOSIS — Z8672 Personal history of thrombophlebitis: Secondary | ICD-10-CM

## 2021-03-21 DIAGNOSIS — R0902 Hypoxemia: Secondary | ICD-10-CM | POA: Diagnosis not present

## 2021-03-21 DIAGNOSIS — N390 Urinary tract infection, site not specified: Secondary | ICD-10-CM | POA: Diagnosis present

## 2021-03-21 DIAGNOSIS — Z7982 Long term (current) use of aspirin: Secondary | ICD-10-CM

## 2021-03-21 DIAGNOSIS — R778 Other specified abnormalities of plasma proteins: Secondary | ICD-10-CM | POA: Diagnosis not present

## 2021-03-21 DIAGNOSIS — D72829 Elevated white blood cell count, unspecified: Secondary | ICD-10-CM | POA: Diagnosis not present

## 2021-03-21 DIAGNOSIS — I480 Paroxysmal atrial fibrillation: Secondary | ICD-10-CM | POA: Diagnosis not present

## 2021-03-21 DIAGNOSIS — N400 Enlarged prostate without lower urinary tract symptoms: Secondary | ICD-10-CM | POA: Diagnosis present

## 2021-03-21 DIAGNOSIS — Z880 Allergy status to penicillin: Secondary | ICD-10-CM

## 2021-03-21 DIAGNOSIS — K219 Gastro-esophageal reflux disease without esophagitis: Secondary | ICD-10-CM | POA: Diagnosis present

## 2021-03-21 DIAGNOSIS — Z9049 Acquired absence of other specified parts of digestive tract: Secondary | ICD-10-CM

## 2021-03-21 DIAGNOSIS — R404 Transient alteration of awareness: Secondary | ICD-10-CM | POA: Diagnosis not present

## 2021-03-21 DIAGNOSIS — R680 Hypothermia, not associated with low environmental temperature: Secondary | ICD-10-CM | POA: Diagnosis present

## 2021-03-21 DIAGNOSIS — Z96652 Presence of left artificial knee joint: Secondary | ICD-10-CM | POA: Diagnosis present

## 2021-03-21 DIAGNOSIS — I10 Essential (primary) hypertension: Secondary | ICD-10-CM | POA: Diagnosis present

## 2021-03-21 DIAGNOSIS — J9811 Atelectasis: Secondary | ICD-10-CM | POA: Diagnosis not present

## 2021-03-21 DIAGNOSIS — Z79899 Other long term (current) drug therapy: Secondary | ICD-10-CM

## 2021-03-21 LAB — I-STAT CHEM 8, ED
BUN: 49 mg/dL — ABNORMAL HIGH (ref 8–23)
Calcium, Ion: 1.11 mmol/L — ABNORMAL LOW (ref 1.15–1.40)
Chloride: 97 mmol/L — ABNORMAL LOW (ref 98–111)
Creatinine, Ser: 1.9 mg/dL — ABNORMAL HIGH (ref 0.61–1.24)
Glucose, Bld: 93 mg/dL (ref 70–99)
HCT: 31 % — ABNORMAL LOW (ref 39.0–52.0)
Hemoglobin: 10.5 g/dL — ABNORMAL LOW (ref 13.0–17.0)
Potassium: 4.3 mmol/L (ref 3.5–5.1)
Sodium: 134 mmol/L — ABNORMAL LOW (ref 135–145)
TCO2: 28 mmol/L (ref 22–32)

## 2021-03-21 LAB — CBC
HCT: 28.1 % — ABNORMAL LOW (ref 39.0–52.0)
Hemoglobin: 9.4 g/dL — ABNORMAL LOW (ref 13.0–17.0)
MCH: 36.6 pg — ABNORMAL HIGH (ref 26.0–34.0)
MCHC: 33.5 g/dL (ref 30.0–36.0)
MCV: 109.3 fL — ABNORMAL HIGH (ref 80.0–100.0)
Platelets: 155 10*3/uL (ref 150–400)
RBC: 2.57 MIL/uL — ABNORMAL LOW (ref 4.22–5.81)
RDW: 13.3 % (ref 11.5–15.5)
WBC: 38.9 10*3/uL — ABNORMAL HIGH (ref 4.0–10.5)
nRBC: 0 % (ref 0.0–0.2)

## 2021-03-21 LAB — COMPREHENSIVE METABOLIC PANEL
ALT: 7 U/L (ref 0–44)
AST: 41 U/L (ref 15–41)
Albumin: 2.9 g/dL — ABNORMAL LOW (ref 3.5–5.0)
Alkaline Phosphatase: 60 U/L (ref 38–126)
Anion gap: 10 (ref 5–15)
BUN: 41 mg/dL — ABNORMAL HIGH (ref 8–23)
CO2: 24 mmol/L (ref 22–32)
Calcium: 8.6 mg/dL — ABNORMAL LOW (ref 8.9–10.3)
Chloride: 99 mmol/L (ref 98–111)
Creatinine, Ser: 1.84 mg/dL — ABNORMAL HIGH (ref 0.61–1.24)
GFR, Estimated: 35 mL/min — ABNORMAL LOW (ref >60)
Glucose, Bld: 93 mg/dL (ref 70–99)
Potassium: 4.3 mmol/L (ref 3.5–5.1)
Sodium: 133 mmol/L — ABNORMAL LOW (ref 135–145)
Total Bilirubin: 0.6 mg/dL (ref 0.3–1.2)
Total Protein: 5.8 g/dL — ABNORMAL LOW (ref 6.5–8.1)

## 2021-03-21 LAB — CBC WITH DIFFERENTIAL/PLATELET
Abs Immature Granulocytes: 0 10*3/uL (ref 0.00–0.07)
Basophils Absolute: 0 10*3/uL (ref 0.0–0.1)
Basophils Relative: 0 %
Eosinophils Absolute: 0 10*3/uL (ref 0.0–0.5)
Eosinophils Relative: 0 %
HCT: 29.6 % — ABNORMAL LOW (ref 39.0–52.0)
Hemoglobin: 10 g/dL — ABNORMAL LOW (ref 13.0–17.0)
Lymphocytes Relative: 2 %
Lymphs Abs: 0.8 10*3/uL (ref 0.7–4.0)
MCH: 36.6 pg — ABNORMAL HIGH (ref 26.0–34.0)
MCHC: 33.8 g/dL (ref 30.0–36.0)
MCV: 108.4 fL — ABNORMAL HIGH (ref 80.0–100.0)
Monocytes Absolute: 0.8 10*3/uL (ref 0.1–1.0)
Monocytes Relative: 2 %
Neutro Abs: 36.8 10*3/uL — ABNORMAL HIGH (ref 1.7–7.7)
Neutrophils Relative %: 96 %
Platelets: 158 10*3/uL (ref 150–400)
RBC: 2.73 MIL/uL — ABNORMAL LOW (ref 4.22–5.81)
RDW: 13.4 % (ref 11.5–15.5)
WBC: 38.3 10*3/uL — ABNORMAL HIGH (ref 4.0–10.5)
nRBC: 0 % (ref 0.0–0.2)
nRBC: 1 /100 WBC — ABNORMAL HIGH

## 2021-03-21 LAB — LACTIC ACID, PLASMA
Lactic Acid, Venous: 2.7 mmol/L (ref 0.5–1.9)
Lactic Acid, Venous: 3 mmol/L (ref 0.5–1.9)
Lactic Acid, Venous: 1.9 mmol/L (ref 0.5–1.9)

## 2021-03-21 LAB — URINALYSIS, ROUTINE W REFLEX MICROSCOPIC
Bilirubin Urine: NEGATIVE
Glucose, UA: NEGATIVE mg/dL
Hgb urine dipstick: NEGATIVE
Ketones, ur: 5 mg/dL — AB
Leukocytes,Ua: NEGATIVE
Nitrite: NEGATIVE
Protein, ur: NEGATIVE mg/dL
Specific Gravity, Urine: 1.01 (ref 1.005–1.030)
pH: 5 (ref 5.0–8.0)

## 2021-03-21 LAB — GLUCOSE, CAPILLARY
Glucose-Capillary: 82 mg/dL (ref 70–99)
Glucose-Capillary: 88 mg/dL (ref 70–99)

## 2021-03-21 LAB — I-STAT VENOUS BLOOD GAS, ED
Acid-Base Excess: 4 mmol/L — ABNORMAL HIGH (ref 0.0–2.0)
Bicarbonate: 29.8 mmol/L — ABNORMAL HIGH (ref 20.0–28.0)
Calcium, Ion: 1.12 mmol/L — ABNORMAL LOW (ref 1.15–1.40)
HCT: 31 % — ABNORMAL LOW (ref 39.0–52.0)
Hemoglobin: 10.5 g/dL — ABNORMAL LOW (ref 13.0–17.0)
O2 Saturation: 99 %
Potassium: 4.4 mmol/L (ref 3.5–5.1)
Sodium: 134 mmol/L — ABNORMAL LOW (ref 135–145)
TCO2: 31 mmol/L (ref 22–32)
pCO2, Ven: 52 mmHg (ref 44.0–60.0)
pH, Ven: 7.366 (ref 7.250–7.430)
pO2, Ven: 121 mmHg — ABNORMAL HIGH (ref 32.0–45.0)

## 2021-03-21 LAB — RESP PANEL BY RT-PCR (FLU A&B, COVID) ARPGX2
Influenza A by PCR: NEGATIVE
Influenza B by PCR: NEGATIVE
SARS Coronavirus 2 by RT PCR: NEGATIVE

## 2021-03-21 LAB — PROCALCITONIN: Procalcitonin: 2.36 ng/mL

## 2021-03-21 LAB — APTT: aPTT: 33 seconds (ref 24–36)

## 2021-03-21 LAB — PROTIME-INR
INR: 1.1 (ref 0.8–1.2)
Prothrombin Time: 14.2 seconds (ref 11.4–15.2)

## 2021-03-21 LAB — MAGNESIUM: Magnesium: 1.9 mg/dL (ref 1.7–2.4)

## 2021-03-21 LAB — RAPID URINE DRUG SCREEN, HOSP PERFORMED
Amphetamines: NOT DETECTED
Barbiturates: NOT DETECTED
Benzodiazepines: NOT DETECTED
Cocaine: NOT DETECTED
Opiates: NOT DETECTED
Tetrahydrocannabinol: NOT DETECTED

## 2021-03-21 LAB — TROPONIN I (HIGH SENSITIVITY)
Troponin I (High Sensitivity): 11 ng/L (ref <18)
Troponin I (High Sensitivity): 14 ng/L (ref <18)

## 2021-03-21 LAB — TSH: TSH: 2.185 u[IU]/mL (ref 0.350–4.500)

## 2021-03-21 LAB — BRAIN NATRIURETIC PEPTIDE: B Natriuretic Peptide: 765.6 pg/mL — ABNORMAL HIGH (ref 0.0–100.0)

## 2021-03-21 LAB — MRSA NEXT GEN BY PCR, NASAL: MRSA by PCR Next Gen: NOT DETECTED

## 2021-03-21 MED ORDER — FAMOTIDINE 20 MG PO TABS: 20.0000 mg | ORAL_TABLET | Freq: Two times a day (BID) | ORAL | Status: DC

## 2021-03-21 MED ORDER — SODIUM CHLORIDE 0.9 % IV SOLN
2.0000 g | INTRAVENOUS | Status: DC
  Administered 2021-03-22 – 2021-03-23 (×2): 2 g via INTRAVENOUS
  Filled 2021-03-21 (×2): qty 2

## 2021-03-21 MED ORDER — DOCUSATE SODIUM 100 MG PO CAPS
100.0000 mg | ORAL_CAPSULE | Freq: Two times a day (BID) | ORAL | Status: DC | PRN
  Administered 2021-03-22: 100 mg via ORAL
  Filled 2021-03-21 (×2): qty 1

## 2021-03-21 MED ORDER — SODIUM CHLORIDE 0.9 % IV SOLN: 250.0000 mL | INTRAVENOUS | Status: DC

## 2021-03-21 MED ORDER — SIMVASTATIN 5 MG PO TABS
10.0000 mg | ORAL_TABLET | Freq: Every day | ORAL | Status: DC
  Administered 2021-03-22: 10 mg via ORAL
  Filled 2021-03-21 (×2): qty 2

## 2021-03-21 MED ORDER — LEVOTHYROXINE SODIUM 50 MCG PO TABS
50.0000 ug | ORAL_TABLET | Freq: Every day | ORAL | Status: DC
  Administered 2021-03-22 – 2021-03-23 (×2): 50 ug via ORAL
  Filled 2021-03-21 (×2): qty 1

## 2021-03-21 MED ORDER — NOREPINEPHRINE 4 MG/250ML-% IV SOLN
0.0000 ug/min | INTRAVENOUS | Status: DC
  Administered 2021-03-21 (×3): 2.5 ug/min via INTRAVENOUS
  Administered 2021-03-21: 5 ug/min via INTRAVENOUS
  Filled 2021-03-21: qty 250

## 2021-03-21 MED ORDER — LACTATED RINGERS IV BOLUS (SEPSIS)
1000.0000 mL | Freq: Once | INTRAVENOUS | Status: AC
  Administered 2021-03-21: 1000 mL via INTRAVENOUS

## 2021-03-21 MED ORDER — NOREPINEPHRINE 4 MG/250ML-% IV SOLN
2.0000 ug/min | INTRAVENOUS | Status: DC
  Administered 2021-03-21: 4 ug/min via INTRAVENOUS
  Administered 2021-03-22: 5 ug/min via INTRAVENOUS
  Filled 2021-03-21: qty 250

## 2021-03-21 MED ORDER — PANTOPRAZOLE SODIUM 40 MG PO TBEC
40.0000 mg | DELAYED_RELEASE_TABLET | Freq: Two times a day (BID) | ORAL | Status: DC
  Administered 2021-03-21 – 2021-03-22 (×3): 40 mg via ORAL
  Filled 2021-03-21 (×4): qty 1

## 2021-03-21 MED ORDER — HEPARIN SODIUM (PORCINE) 5000 UNIT/ML IJ SOLN
5000.0000 [IU] | Freq: Three times a day (TID) | INTRAMUSCULAR | Status: DC
  Administered 2021-03-21 – 2021-03-22 (×2): 5000 [IU] via SUBCUTANEOUS
  Filled 2021-03-21 (×2): qty 1

## 2021-03-21 MED ORDER — ASPIRIN EC 81 MG PO TBEC
81.0000 mg | DELAYED_RELEASE_TABLET | Freq: Every day | ORAL | Status: DC
  Administered 2021-03-21 – 2021-03-22 (×2): 81 mg via ORAL
  Filled 2021-03-21 (×3): qty 1

## 2021-03-21 MED ORDER — VITAMIN B-12 1000 MCG PO TABS
500.0000 ug | ORAL_TABLET | Freq: Every day | ORAL | Status: DC
  Administered 2021-03-22: 500 ug via ORAL
  Filled 2021-03-21 (×2): qty 1

## 2021-03-21 MED ORDER — SODIUM CHLORIDE 0.9 % IV BOLUS
500.0000 mL | Freq: Once | INTRAVENOUS | Status: AC
  Administered 2021-03-21: 500 mL via INTRAVENOUS

## 2021-03-21 MED ORDER — METRONIDAZOLE 500 MG/100ML IV SOLN
500.0000 mg | Freq: Once | INTRAVENOUS | Status: AC
  Administered 2021-03-21: 500 mg via INTRAVENOUS
  Filled 2021-03-21: qty 100

## 2021-03-21 MED ORDER — SODIUM CHLORIDE 0.9 % IV SOLN
2.0000 g | Freq: Once | INTRAVENOUS | Status: AC
  Administered 2021-03-21: 2 g via INTRAVENOUS
  Filled 2021-03-21: qty 2

## 2021-03-21 MED ORDER — VANCOMYCIN HCL 1750 MG/350ML IV SOLN
1750.0000 mg | Freq: Once | INTRAVENOUS | Status: AC
  Administered 2021-03-21: 1750 mg via INTRAVENOUS
  Filled 2021-03-21: qty 350

## 2021-03-21 MED ORDER — SODIUM CHLORIDE 0.9 % IV SOLN
250.0000 mL | INTRAVENOUS | Status: DC
  Administered 2021-03-21 – 2021-03-22 (×2): 250 mL via INTRAVENOUS

## 2021-03-21 MED ORDER — ACETAMINOPHEN 325 MG PO TABS
650.0000 mg | ORAL_TABLET | ORAL | Status: DC | PRN
  Administered 2021-03-22 (×2): 650 mg via ORAL
  Filled 2021-03-21 (×2): qty 2

## 2021-03-21 MED ORDER — POLYETHYLENE GLYCOL 3350 17 G PO PACK: 17.0000 g | PACK | Freq: Every day | ORAL | Status: DC | PRN

## 2021-03-21 MED ORDER — CARBIDOPA-LEVODOPA ER 50-200 MG PO TBCR
1.0000 | EXTENDED_RELEASE_TABLET | Freq: Every day | ORAL | Status: DC
  Administered 2021-03-21: 1 via ORAL
  Filled 2021-03-21: qty 1

## 2021-03-21 MED ORDER — VANCOMYCIN HCL IN DEXTROSE 1-5 GM/200ML-% IV SOLN
1000.0000 mg | INTRAVENOUS | Status: DC
  Administered 2021-03-22: 1000 mg via INTRAVENOUS
  Filled 2021-03-21: qty 200

## 2021-03-21 MED ORDER — LACTATED RINGERS IV SOLN: INTRAVENOUS | Status: AC

## 2021-03-21 NOTE — ED Notes (Signed)
Bair Hugger placed and warming.

## 2021-03-21 NOTE — Progress Notes (Signed)
Pharmacy Antibiotic Note  Zachary Decker is a 87 y.o. male admitted on 03/21/2021 presenting with AMS and cellulitis.  Pharmacy has been consulted for vancomycin and cefepime dosing.  Plan: Vancomycin 1750 mg IV x 1, then 1000 mg IV q 24h (eAUC 500, Goal AUC 400-550, SCr 1.9) Cefepime 2g IV every 24h Monitor renal function, Cx and clinical progression to narrow Vancomycin levels as needed     No data recorded.  Recent Labs  Lab 03/21/21 1541  CREATININE 1.90*    Estimated Creatinine Clearance: 32.1 mL/min (A) (by C-G formula based on SCr of 1.9 mg/dL (H)).    Allergies  Allergen Reactions   Losartan Potassium Other (See Comments)    Not known   Naproxen Nausea And Vomiting   Penicillins Rash    Did it involve swelling of the face/tongue/throat, SOB, or low BP? No Did it involve sudden or severe rash/hives, skin peeling, or any reaction on the inside of your mouth or nose? Yes Did you need to seek medical attention at a hospital or doctor's office? Unknown When did it last happen?       If all above answers are "NO", may proceed with cephalosporin use.  TOLERATED CEFTRIAXONE 10/07/2018    Bertis Ruddy, PharmD Clinical Pharmacist ED Pharmacist Phone # [phone] 03/21/2021 3:50 PM

## 2021-03-21 NOTE — Sepsis Progress Note (Signed)
Notified bedside nurse of need to administer antibiotics.  

## 2021-03-21 NOTE — ED Provider Notes (Signed)
[facility] EMERGENCY DEPARTMENT Provider Note   CSN: [id number] Arrival date & time: 03/21/21  1424     History Chief Complaint  Patient presents with   Altered Mental Status    Zachary Decker is a 87 y.o. male.  HPI     87 year old male with history of hypertension, hyperlipidemia, peripheral vascular disease MSSA bacteremia in 2020, recent and ongoing treatment for cellulitis with Keflex her primary care physician, presents with concern for generalized weakness, fatigue and hypotension.  He was hypotensive with EMS, received 0.5 mg of epinephrine prior to arrival to the emergency department.  On arrival, blood pressures were in the 50s, however had a brief blood pressure 118 prior to return again to the 14E and 31V systolic.  He reports symptoms of generalized weakness, fatigue, lightheadedness that began this morning.  Denies any other symptoms, including no chest pain, no shortness of breath, no cough, no abdominal pain, no black or bloody stools, no diarrhea, no vomiting.  Family reports he is still taking full dose of his Lasix and lisinopril although discussion was that maybe he will cut them in half at his recent visit with his primary care doctor given borderline blood pressures 99 at that time.  No other new medications.  Past Medical History:  Diagnosis Date   Acute myocardial infarction of inferoposterior wall, subsequent episode of care [facility])    CAD (coronary artery disease)    Encounter for long-term (current) use of other medications    Family history of malignant neoplasm of gastrointestinal tract    GERD (gastroesophageal reflux disease)    Hypercholesterolemia    Hypertension    Hypertrophy of prostate with urinary obstruction and other lower urinary tract symptoms (LUTS)    Personal history of thrombophlebitis    PVD (peripheral vascular disease) (HCC)    Respiratory failure (HCC)    Unspecified adverse effect of unspecified drug,  medicinal and biological substance    Unspecified hypothyroidism     Patient Active Problem List   Diagnosis Date Noted   Septic shock ([place]) 40/01/6760   Acute metabolic encephalopathy    Hyponatremia    Cellulitis of lower extremity    Chronic diastolic CHF (congestive heart failure), NYHA class 2 ([place]) 08/14/2020   Onychomycosis 12/20/2019   Atherosclerosis of aorta ([place]) 08/16/2019   Duodenal ulcer    MSSA bacteremia 04/25/2019   Cellulitis of right leg 04/20/2019   Dehydration 04/20/2019   Generalized weakness 04/20/2019   Cellulitis of left foot    Cellulitis of left leg 10/07/2018   Parkinson's disease ([place]) 04/13/2018   Senile purpura ([place]) 04/13/2018   Insomnia 04/13/2018   Low back pain 04/19/2015   CKD (chronic kidney disease), stage III ([place]) 04/20/2014   Anxiety state 04/17/2014   CAD w/ hx MI s/p CABG 09/07/2012   ANEMIA, VITAMIN B12 DEFICIENCY 04/22/2010   Cardiomyopathy, ischemic 03/22/2010   Actinic keratosis 06/25/2009   BASAL CELL CARCINOMA, NOSE 02/05/2009   Localized osteoarthrosis, lower leg 12/06/2008   HYPERCHOLESTEROLEMIA 10/31/2008   BPH (benign prostatic hyperplasia) 09/28/2007   Hypothyroidism 03/26/2007   Essential hypertension 03/22/2007   GERD 03/22/2007    Past Surgical History:  Procedure Laterality Date   BIOPSY  04/28/2019   Procedure: BIOPSY;  Surgeon: Thornton Park, MD;  Location: [place];  Service: Gastroenterology;;   CATARACT EXTRACTION, BILATERAL     CHOLECYSTECTOMY  02/26/2012   Procedure: LAPAROSCOPIC CHOLECYSTECTOMY WITH INTRAOPERATIVE CHOLANGIOGRAM;  Surgeon: Madilyn Hook, DO;  Location: [place];  Service: General;  Laterality: N/A;   CORONARY ARTERY BYPASS GRAFT     x 5 on August 29, 2008   ESOPHAGOGASTRODUODENOSCOPY (EGD) WITH PROPOFOL N/A 04/28/2019   Procedure: ESOPHAGOGASTRODUODENOSCOPY (EGD) WITH PROPOFOL;  Surgeon: Thornton Park, MD;  Location: [place];  Service: Gastroenterology;  Laterality: N/A;    REPLACEMENT TOTAL KNEE Left    tachecostomy placement     after heart attack   TONSILLECTOMY         Family History  Problem Relation Age of Onset   Heart disease Mother    Tuberculosis Father    Colon cancer Brother     Social History   Tobacco Use   Smoking status: Never   Smokeless tobacco: Never  Vaping Use   Vaping Use: Never used  Substance Use Topics   Alcohol use: No   Drug use: No    Home Medications Prior to Admission medications   Medication Sig Start Date End Date Taking? Authorizing Provider  Acetaminophen (TYLENOL EXTRA STRENGTH PO) Take 1-2 tablets by mouth daily as needed (for back pain).   Yes [provider]  Ascorbic Acid (VITAMIN C) 1000 MG tablet Take 1,000 mg by mouth daily.   Yes [provider]  aspirin EC 81 MG tablet Take 81 mg by mouth daily. Swallow whole.   Yes [provider]  carbidopa-levodopa (SINEMET CR) 50-200 MG tablet Take 1 tablet by mouth at bedtime. Patient taking differently: Take 2 tablets by mouth at bedtime. 11/15/20  Yes Tat, Eustace Quail, DO  carbidopa-levodopa (SINEMET) 25-100 MG tablet Take 2 at 7am, 2 at 11am, 1 at 3pm, 1 at 7pm 11/15/20  Yes Tat, Wells Guiles S, DO  cephALEXin (KEFLEX) 500 MG capsule Take 1 capsule (500 mg total) by mouth 3 (three) times daily. 03/19/21  Yes [name], MD  Cholecalciferol (VITAMIN D3) 125 MCG (5000 UT) TBDP Take 1 tablet by mouth daily.    Yes [provider]  furosemide (LASIX) 40 MG tablet TAKE 1 TABLET BY MOUTH EVERY DAY Patient taking differently: Take 40 mg by mouth daily. 08/14/20  Yes Burnell Blanks, MD  hydrOXYzine (ATARAX/VISTARIL) 10 MG tablet TAKE 1 TABLET BY MOUTH AT BEDTIME AS NEEDED Patient taking differently: Take 10 mg by mouth at bedtime. 10/13/19  Yes [name], MD  levothyroxine (SYNTHROID) 50 MCG tablet TAKE 1 TABLET BY MOUTH DAILY BEFORE BREAKFAST 08/14/20  Yes [name], MD  lisinopril (ZESTRIL) 2.5 MG tablet TAKE 1  TABLET BY MOUTH EVERY DAY 08/14/20  Yes [name], MD  nitroGLYCERIN (NITROSTAT) 0.4 MG SL tablet Place 1 tablet (0.4 mg total) under the tongue every 5 (five) minutes as needed for chest pain. 04/13/18  Yes [name], MD  pantoprazole (PROTONIX) 40 MG tablet TAKE 1 TABLET BY MOUTH TWICE A DAY Patient taking differently: Take 40 mg by mouth daily. One after lunch, One after dinner 01/07/21  Yes [name], MD  simvastatin (ZOCOR) 20 MG tablet TAKE 1 TABLET BY MOUTH EVERYDAY AT BEDTIME Patient taking differently: Take 20 mg by mouth at bedtime. 03/05/20  Yes [name], MD  tamsulosin (FLOMAX) 0.4 MG CAPS capsule TAKE 1 CAPSULE BY MOUTH EVERY DAY Patient taking differently: Take 0.4 mg by mouth daily. 05/14/20  Yes [name], MD  vitamin B-12 (CYANOCOBALAMIN) 500 MCG tablet Take 500 mcg by mouth daily.   Yes [provider]  zinc sulfate 220 (50 Zn) MG capsule Take 220 mg by mouth daily.  Yes [provider]  B Complex-C-Folic Acid (FOLBEE PLUS) TABS TAKE 1 TABLET BY MOUTH EVERY DAY Patient not taking: Reported on 03/21/2021 02/20/21   [name], MD    Allergies    Losartan potassium, Naproxen, and Penicillins  Review of Systems   Review of Systems  Physical Exam Updated Vital Signs BP 102/65 (BP Location: Right Arm)   Pulse 70   Temp (!) 94.7 F (34.8 C) (Rectal) Comment: RN aware  Resp 18   SpO2 95%   Physical Exam  ED Results / Procedures / Treatments   Labs (all labs ordered are listed, but only abnormal results are displayed) Labs Reviewed  LACTIC ACID, PLASMA - Abnormal; Notable for the following components:      Result Value   Lactic Acid, Venous 2.7 (*)    All other components within normal limits  LACTIC ACID, PLASMA - Abnormal; Notable for the following components:   Lactic Acid, Venous 3.0 (*)    All other components within normal limits  COMPREHENSIVE METABOLIC PANEL - Abnormal; Notable for the following  components:   Sodium 133 (*)    BUN 41 (*)    Creatinine, Ser 1.84 (*)    Calcium 8.6 (*)    Total Protein 5.8 (*)    Albumin 2.9 (*)    GFR, Estimated 35 (*)    All other components within normal limits  CBC WITH DIFFERENTIAL/PLATELET - Abnormal; Notable for the following components:   WBC 38.3 (*)    RBC 2.73 (*)    Hemoglobin 10.0 (*)    HCT 29.6 (*)    MCV 108.4 (*)    MCH 36.6 (*)    Neutro Abs 36.8 (*)    nRBC 1 (*)    All other components within normal limits  URINALYSIS, ROUTINE W REFLEX MICROSCOPIC - Abnormal; Notable for the following components:   Ketones, ur 5 (*)    All other components within normal limits  CBC - Abnormal; Notable for the following components:   WBC 38.9 (*)    RBC 2.57 (*)    Hemoglobin 9.4 (*)    HCT 28.1 (*)    MCV 109.3 (*)    MCH 36.6 (*)    All other components within normal limits  BRAIN NATRIURETIC PEPTIDE - Abnormal; Notable for the following components:   B Natriuretic Peptide 765.6 (*)    All other components within normal limits  I-STAT VENOUS BLOOD GAS, ED - Abnormal; Notable for the following components:   pO2, Ven 121.0 (*)    Bicarbonate 29.8 (*)    Acid-Base Excess 4.0 (*)    Sodium 134 (*)    Calcium, Ion 1.12 (*)    HCT 31.0 (*)    Hemoglobin 10.5 (*)    All other components within normal limits  I-STAT CHEM 8, ED - Abnormal; Notable for the following components:   Sodium 134 (*)    Chloride 97 (*)    BUN 49 (*)    Creatinine, Ser 1.90 (*)    Calcium, Ion 1.11 (*)    Hemoglobin 10.5 (*)    HCT 31.0 (*)    All other components within normal limits  RESP PANEL BY RT-PCR (FLU A&B, COVID) ARPGX2  MRSA NEXT GEN BY PCR, NASAL  CULTURE, BLOOD (ROUTINE X 2)  CULTURE, BLOOD (ROUTINE X 2)  URINE CULTURE  PROTIME-INR  APTT  MAGNESIUM  PROCALCITONIN  TSH  RAPID URINE DRUG SCREEN, HOSP PERFORMED  GLUCOSE, CAPILLARY  LACTIC ACID,  PLASMA  PROCALCITONIN  CBC  PHOSPHORUS  MAGNESIUM  COMPREHENSIVE METABOLIC PANEL   TROPONIN I (HIGH SENSITIVITY)  TROPONIN I (HIGH SENSITIVITY)    EKG EKG Interpretation  Date/Time:  Thursday March 21 2021 15:00:42 EDT Ventricular Rate:  56 PR Interval:    QRS Duration: 157 QT Interval:  509 QTC Calculation: 492 R Axis:   28 Text Interpretation: Junctional rhythm Nonspecific intraventricular conduction delay Anterolateral infarct, age indeterminate New TW changes inferior leads, artifact on previous difficult to say if sinus but appears junctional today Confirmed by Gareth Morgan [phone]) on 03/21/2021 3:23:33 PM  Radiology DG Chest Port 1 View  Result Date: 03/21/2021 CLINICAL DATA:  Altered mental status and cellulitis EXAM: PORTABLE CHEST 1 VIEW COMPARISON:  10/07/2018 FINDINGS: Post sternotomy changes. Chronic pleural changes at the right CP angle with elevated right diaphragm. Cardiomegaly. Subsegmental atelectasis left base. Aortic atherosclerosis. No pneumothorax. IMPRESSION: Chronic scarring at the right base. Cardiomegaly with subsegmental atelectasis the base Electronically Signed   By: Donavan Foil M.D.   On: 03/21/2021 15:40    Procedures .Critical Care Performed by: Gareth Morgan, MD Authorized by: Gareth Morgan, MD   Critical care provider statement:    Critical care time (minutes):  45   Critical care was time spent personally by me on the following activities:  Discussions with consultants, evaluation of patient's response to treatment, examination of patient, ordering and performing treatments and interventions, ordering and review of laboratory studies, ordering and review of radiographic studies, pulse oximetry, re-evaluation of patient's condition, obtaining history from patient or surrogate and review of old charts   Medications Ordered in ED Medications  lactated ringers infusion ( Intravenous New Bag/Given 03/21/21 1905)  ceFEPIme (MAXIPIME) 2 g in sodium chloride 0.9 % 100 mL IVPB (has no administration in time range)   vancomycin (VANCOCIN) IVPB 1000 mg/200 mL premix (has no administration in time range)  docusate sodium (COLACE) capsule 100 mg (has no administration in time range)  polyethylene glycol (MIRALAX / GLYCOLAX) packet 17 g (has no administration in time range)  heparin injection 5,000 Units (5,000 Units Subcutaneous Not Given 03/21/21 2230)  acetaminophen (TYLENOL) tablet 650 mg (has no administration in time range)  aspirin EC tablet 81 mg (81 mg Oral Given 03/21/21 1903)  carbidopa-levodopa (SINEMET CR) 50-200 MG per tablet controlled release 1 tablet (1 tablet Oral Given 03/21/21 2229)  levothyroxine (SYNTHROID) tablet 50 mcg (has no administration in time range)  pantoprazole (PROTONIX) EC tablet 40 mg (40 mg Oral Given 03/21/21 2229)  simvastatin (ZOCOR) tablet 10 mg (has no administration in time range)  vitamin B-12 (CYANOCOBALAMIN) tablet 500 mcg (has no administration in time range)  0.9 %  sodium chloride infusion (250 mLs Intravenous New Bag/Given 03/21/21 2228)  norepinephrine (LEVOPHED) 4mg  in 243mL premix infusion (4 mcg/min Intravenous New Bag/Given 03/21/21 2032)  lactated ringers bolus 1,000 mL (0 mLs Intravenous Stopped 03/21/21 1545)    And  lactated ringers bolus 1,000 mL (0 mLs Intravenous Stopped 03/21/21 1650)    And  lactated ringers bolus 1,000 mL (0 mLs Intravenous Stopped 03/21/21 1807)  ceFEPIme (MAXIPIME) 2 g in sodium chloride 0.9 % 100 mL IVPB (0 g Intravenous Stopped 03/21/21 1637)  metroNIDAZOLE (FLAGYL) IVPB 500 mg (0 mg Intravenous Stopped 03/21/21 1807)  vancomycin (VANCOREADY) IVPB 1750 mg/350 mL (0 mg Intravenous Stopped 03/21/21 1930)  sodium chloride 0.9 % bolus 500 mL (500 mLs Intravenous New Bag/Given 03/21/21 2043)    ED Course  I have reviewed the triage vital  signs and the nursing notes.  Pertinent labs & imaging results that were available during my care of the patient were reviewed by me and considered in my medical decision making (see chart for  details).    MDM Rules/Calculators/A&P                           87 year old male with history of hypertension, hyperlipidemia, peripheral vascular disease MSSA bacteremia in 2020, recent and ongoing treatment for cellulitis with Keflex her primary care physician, presents with concern for generalized weakness, fatigue and hypotension.  Given concern for cellulitis is possible source of infection with sepsis as possible etiology of hypotension, 30/kg fluid, empiric antibiotics.  Blood pressures persistently in 50s-60s after initiating IV fluids and levephed started at 5. Initiial improvement and attempted to dc levophed, however blood pressures decreased again and levophed reinitiated.   Labs with leukocytosis 38000, lactic acid elevation, Cr increase from prior. Leukocytosis and hypothermia more consistent with sepsis as etiology of hypotension, although consider other etiologies such as cardiac. No sign of ACS, PE,ruptured AAA or other abnormalities. Called ICU for evaluation in setting of concern for septic shock on pressors at this time.  Final Clinical Impression(s) / ED Diagnoses Final diagnoses:  Altered mental status, unspecified altered mental status type  Hypotension, unspecified hypotension type  Septic shock (HCC)  Leukocytosis, unspecified type    Rx / DC Orders ED Discharge Orders     None        Gareth Morgan, MD 03/21/21 2302

## 2021-03-21 NOTE — ED Notes (Signed)
Spoke with RN ICU, patient not yet assigned to room, room 2 is incorrect. Unable to give report at this time.

## 2021-03-21 NOTE — H&P (Signed)
NAME:  Zachary Decker, MRN:  [id number], DOB:  09-26-1932, LOS: 0 ADMISSION DATE:  03/21/2021, CONSULTATION DATE:  9/29 REFERRING MD:  Dr. Billy Fischer, CHIEF COMPLAINT:  Septic Shock   History of Present Illness:  Patient is an 87 YO M with pertinent past medical history of HTN, HLD, hypothyroidism, PVD, and MSSA bacteremia in 2020, recurrent BLE cellulitis being treated treated currently with Keflex presents to [facility] on 9/29 with generalized weakness, fatigue, and hypotension.  On 9/29, patient states generalized weakness, fatigue, lightheadedness began that morning.  Patient was hypotensive and EMS was called.  Patient received 0.5 mg of epi prior to coming to [facility] ED.  Upon arrival to ED, SBP in the 50-60s.  Patient takes Lasix and lisinopril for HTN which was recently cut in half after recent primary care visit.  Patient denies fever, nausea, vomiting, diarrhea, cough, shortness of breath, chest pain, abdominal pain.  ER labs: Lactic 2.7, WBC 38.3, Hgb 10, Na 133, BUN 41, creat 1.84, troponin 11, procalcitonin, UA and urine culture pending, Bcx2 pending. CXR chronic scarring at the right base; cardiomegaly w/ atelectasis at the base.   Patient was given 2 L of IV fluid boluses and awaiting 3rd bolus. Started on Levo. Started cefepime and vanc. Patient fever 33 Celsius. Placed on bare hugger.  PCCM asked to admit to ICU while on pressors.  Pertinent  Medical History   Past Medical History:  Diagnosis Date   Acute myocardial infarction of inferoposterior wall, subsequent episode of care ([place])    CAD (coronary artery disease)    Encounter for long-term (current) use of other medications    Family history of malignant neoplasm of gastrointestinal tract    GERD (gastroesophageal reflux disease)    Hypercholesterolemia    Hypertension    Hypertrophy of prostate with urinary obstruction and other lower urinary tract symptoms (LUTS)    Personal history of thrombophlebitis    PVD (peripheral  vascular disease) ([place])    Respiratory failure ([place])    Unspecified adverse effect of unspecified drug, medicinal and biological substance    Unspecified hypothyroidism      Significant Hospital Events: Including procedures, antibiotic start and stop dates in addition to other pertinent events   9/29: admitted to ICU for septic shock needing levo  Interim History / Subjective:  Patient is elderly w/ altered mental status Only 2.5 mcg of Levo running; BP MAP 80 2nd Liter of fluids running Cellulitis present BLE  Objective   Blood pressure 93/78, pulse 63, temperature (!) 92.5 F (33.6 C), temperature source Rectal, resp. rate 17, SpO2 97 %.        Intake/Output Summary (Last 24 hours) at 03/21/2021 1613 Last data filed at 03/21/2021 1553 Gross per 24 hour  Intake 8.45 ml  Output --  Net 8.45 ml   There were no vitals filed for this visit.  Examination: General:   ill appearing elderly male HEENT: MM pink/moist Neuro: altered/confused; patient able to answer most questions; MAE CV: s1s2, RRR, no m/r/g; on 2.5 mcg of levo PULM:  dim BS bilaterally; on room air GI: soft, bsx4 active  Extremities: warm/dry, severe cellulitis [facility] Problem list     Assessment & Plan:  Septic shock: possibly from his recurrent cellulitis Hypothermia BLE cellulitis P: -admit to ICU overnight -Levo turned off -continue giving 3 L of fluid boluses -if MAP <65 after 3rd bolus consider restarting Levo -follow Bcx2, UA, urine culture, lactic acid, procalcitonin, bnp -continue empiric  antibiotics: cefepime, vanc, flagyl -trend cbc/fever curve -continue bare hugger for normothermia goal -consider echo  Acute metabolic encephalopathy Hx of parkinsons P: -Hold sedating medication -frequent neuro checks -continue home sinemet  AKI P: -Trend BMP / urinary output -Replace electrolytes as indicated -Avoid nephrotoxic agents, ensure adequate renal perfusion  Mild  hyponatremia P: -trend bmp  Hx of HTN, HLD, PVD P: -hold home bp meds -restart home statin tomorrow -continue ASA  GERD P: -PPI  Hx of Hypothyroidism P: -check TSH -continue synthroid  Hx of hypertrophy of prostate w/ urinary obstruction and LUTS P: -hold flomax -monitor UOP    Best Practice (right click and "Reselect all SmartList Selections" daily)   Diet/type: NPO w/ oral meds DVT prophylaxis: prophylactic heparin  GI prophylaxis: PPI Lines: N/A Foley:  N/A Code Status:  full code Last date of multidisciplinary goals of care discussion [9/29: spoke with daughter and patient at bedside]  Labs   CBC: Recent Labs  Lab 03/21/21 1502 03/21/21 1541 03/21/21 1542  WBC 38.3*  --   --   NEUTROABS PENDING  --   --   HGB 10.0* 10.5* 10.5*  HCT 29.6* 31.0* 31.0*  MCV 108.4*  --   --   PLT 158  --   --     Basic Metabolic Panel: Recent Labs  Lab 03/21/21 1541 03/21/21 1542  NA 134* 134*  K 4.3 4.4  CL 97*  --   GLUCOSE 93  --   BUN 49*  --   CREATININE 1.90*  --    GFR: Estimated Creatinine Clearance: 32.1 mL/min (A) (by C-G formula based on SCr of 1.9 mg/dL (H)). Recent Labs  Lab 03/21/21 1502  WBC 38.3*  LATICACIDVEN 2.7*    Liver Function Tests: No results for input(s): AST, ALT, ALKPHOS, BILITOT, PROT, ALBUMIN in the last 168 hours. No results for input(s): LIPASE, AMYLASE in the last 168 hours. No results for input(s): AMMONIA in the last 168 hours.  ABG    Component Value Date/Time   PHART 7.459 (H) 09/10/2008 0350   PCO2ART 37.0 09/10/2008 0350   PO2ART 89.0 09/10/2008 0350   HCO3 29.8 (H) 03/21/2021 1542   TCO2 31 03/21/2021 1542   ACIDBASEDEF 1.0 08/29/2008 2326   O2SAT 99.0 03/21/2021 1542     Coagulation Profile: Recent Labs  Lab 03/21/21 1502  INR 1.1    Cardiac Enzymes: No results for input(s): CKTOTAL, CKMB, CKMBINDEX, TROPONINI in the last 168 hours.  HbA1C: No results found for: HGBA1C  CBG: No results for  input(s): GLUCAP in the last 168 hours.  Review of Systems:   Unable to obtain due to confusion.  Past Medical History:  He,  has a past medical history of Acute myocardial infarction of inferoposterior wall, subsequent episode of care [facility]), CAD (coronary artery disease), Encounter for long-term (current) use of other medications, Family history of malignant neoplasm of gastrointestinal tract, GERD (gastroesophageal reflux disease), Hypercholesterolemia, Hypertension, Hypertrophy of prostate with urinary obstruction and other lower urinary tract symptoms (LUTS), Personal history of thrombophlebitis, PVD (peripheral vascular disease) ([place]), Respiratory failure ([place]), Unspecified adverse effect of unspecified drug, medicinal and biological substance, and Unspecified hypothyroidism.   Surgical History:   Past Surgical History:  Procedure Laterality Date   BIOPSY  04/28/2019   Procedure: BIOPSY;  Surgeon: Thornton Park, MD;  Location: [facility] ENDOSCOPY;  Service: Gastroenterology;;   CATARACT EXTRACTION, BILATERAL     CHOLECYSTECTOMY  02/26/2012   Procedure: LAPAROSCOPIC CHOLECYSTECTOMY WITH INTRAOPERATIVE CHOLANGIOGRAM;  Surgeon: Aaron Edelman  Lilyan Punt, DO;  Location: [place];  Service: General;  Laterality: N/A;   CORONARY ARTERY BYPASS GRAFT     x 5 on August 29, 2008   ESOPHAGOGASTRODUODENOSCOPY (EGD) WITH PROPOFOL N/A 04/28/2019   Procedure: ESOPHAGOGASTRODUODENOSCOPY (EGD) WITH PROPOFOL;  Surgeon: Thornton Park, MD;  Location: [place];  Service: Gastroenterology;  Laterality: N/A;   REPLACEMENT TOTAL KNEE Left    tachecostomy placement     after heart attack   TONSILLECTOMY       Social History:   reports that he has never smoked. He has never used smokeless tobacco. He reports that he does not drink alcohol and does not use drugs.   Family History:  His family history includes Colon cancer in his brother; Heart disease in his mother; Tuberculosis in his father.   Allergies Allergies   Allergen Reactions   Losartan Potassium Other (See Comments)    Not known   Naproxen Nausea And Vomiting   Penicillins Rash    Did it involve swelling of the face/tongue/throat, SOB, or low BP? No Did it involve sudden or severe rash/hives, skin peeling, or any reaction on the inside of your mouth or nose? Yes Did you need to seek medical attention at a hospital or doctor's office? Unknown When did it last happen?       If all above answers are "NO", may proceed with cephalosporin use.  TOLERATED CEFTRIAXONE 10/07/2018     Home Medications  Prior to Admission medications   Medication Sig Start Date End Date Taking? Authorizing Provider  Acetaminophen (TYLENOL EXTRA STRENGTH PO) Take 1-2 tablets by mouth daily as needed (for back pain).    [provider]  Ascorbic Acid (VITAMIN C) 1000 MG tablet Take 1,000 mg by mouth daily.    [provider]  aspirin EC 81 MG tablet Take 81 mg by mouth daily. Swallow whole.    [provider]  B Complex-C-Folic Acid (FOLBEE PLUS) TABS TAKE 1 TABLET BY MOUTH EVERY DAY 02/20/21   [name], MD  carbidopa-levodopa (SINEMET CR) 50-200 MG tablet Take 1 tablet by mouth at bedtime. 11/15/20   Tat, Eustace Quail, DO  carbidopa-levodopa (SINEMET) 25-100 MG tablet Take 2 at 7am, 2 at 11am, 1 at 3pm, 1 at 7pm 11/15/20   Tat, Rebecca S, DO  cephALEXin (KEFLEX) 500 MG capsule Take 1 capsule (500 mg total) by mouth 3 (three) times daily. 03/19/21   [name], MD  Cholecalciferol (VITAMIN D3) 125 MCG (5000 UT) TBDP Take 1 tablet by mouth daily.     [provider]  furosemide (LASIX) 40 MG tablet TAKE 1 TABLET BY MOUTH EVERY DAY 08/14/20   Burnell Blanks, MD  hydrOXYzine (ATARAX/VISTARIL) 10 MG tablet TAKE 1 TABLET BY MOUTH AT BEDTIME AS NEEDED 10/13/19   [name], MD  levothyroxine (SYNTHROID) 50 MCG tablet TAKE 1 TABLET BY MOUTH DAILY BEFORE BREAKFAST 08/14/20   [name], MD  lisinopril (ZESTRIL)  2.5 MG tablet TAKE 1 TABLET BY MOUTH EVERY DAY 08/14/20   [name], MD  nitroGLYCERIN (NITROSTAT) 0.4 MG SL tablet Place 1 tablet (0.4 mg total) under the tongue every 5 (five) minutes as needed for chest pain. 04/13/18   [name], MD  pantoprazole (PROTONIX) 40 MG tablet TAKE 1 TABLET BY MOUTH TWICE A DAY 01/07/21   [name], MD  simvastatin (ZOCOR) 20 MG tablet TAKE 1 TABLET BY MOUTH EVERYDAY AT BEDTIME 03/05/20   [name],  MD  tamsulosin (FLOMAX) 0.4 MG CAPS capsule TAKE 1 CAPSULE BY MOUTH EVERY DAY 05/14/20   [name], MD  vitamin B-12 (CYANOCOBALAMIN) 500 MCG tablet Take 500 mcg by mouth daily.    [provider]  zinc sulfate 220 (50 Zn) MG capsule Take 220 mg by mouth daily.    [provider]     Critical care time: 45 minutes    JD Rexene Agent [facility] 03/21/2021, 4:14 PM  Please see [email or web address] for pager details.  From 7A-7P if no response, please call [phone]. After hours, please call ELink [phone].

## 2021-03-21 NOTE — ED Triage Notes (Signed)
Pt arrives via [facility] from home with AMS and cellulitis. EMS gave 0.5 epi for hypotension. BP low in triage.

## 2021-03-21 NOTE — Progress Notes (Signed)
[facility] Progress Note Patient Name: Zachary Decker DOB: Sep 18, 1932 MRN: [id number]   Date of Service  03/21/2021  HPI/Events of Note  Patient is an 87 YO M with pertinent past medical history of HTN, HLD, hypothyroidism, PVD, and MSSA bacteremia in 2020, recurrent BLE cellulitis being treated treated currently with Keflex presents to [facility] on 9/29 with generalized weakness, fatigue, and hypotension felt to be d/t bilateral LE cellulitis. Lactic Acid = 0.7 --> 3.0. BP =94/81 and HR 58. LVEF = 45-50%. No CVL or CVP. Sat = 89 - 92%. CXR with basilar atelectasis.  eICU Interventions  Plan: Bolus with 0.9 NaCl 500 mL IV now. Continue to trend Lactic Acid. Incentive Spirometry Q 1 hours while awake. Change Norepinephrine order to via PIV.     Intervention Category Evaluation Type: New Patient Evaluation  Lysle Dingwall 03/21/2021, 8:07 PM

## 2021-03-21 NOTE — ED Notes (Signed)
Still unable to obtain oral temp. Bair hugger remains in place.

## 2021-03-21 NOTE — Sepsis Progress Note (Signed)
Elink tracking the Code Sepsis. 

## 2021-03-21 NOTE — Sepsis Progress Note (Signed)
Since Lactates are trending up, notified the bedside nurse to collect another lactate at 1900.

## 2021-03-21 NOTE — ED Notes (Signed)
Levophed restarted for low BP.

## 2021-03-21 NOTE — Sepsis Progress Note (Signed)
Notified bedside nurse of need to draw repeat lactic acid after the third liter of fluid bolus.

## 2021-03-21 NOTE — ED Notes (Signed)
Provider at bedside

## 2021-03-22 ENCOUNTER — Encounter (HOSPITAL_COMMUNITY): Payer: Self-pay | Admitting: Critical Care Medicine

## 2021-03-22 ENCOUNTER — Inpatient Hospital Stay (HOSPITAL_COMMUNITY): Payer: Medicare PPO

## 2021-03-22 DIAGNOSIS — R778 Other specified abnormalities of plasma proteins: Secondary | ICD-10-CM | POA: Diagnosis not present

## 2021-03-22 DIAGNOSIS — I2510 Atherosclerotic heart disease of native coronary artery without angina pectoris: Secondary | ICD-10-CM

## 2021-03-22 DIAGNOSIS — R001 Bradycardia, unspecified: Secondary | ICD-10-CM | POA: Diagnosis not present

## 2021-03-22 DIAGNOSIS — A419 Sepsis, unspecified organism: Secondary | ICD-10-CM | POA: Diagnosis not present

## 2021-03-22 DIAGNOSIS — R4182 Altered mental status, unspecified: Secondary | ICD-10-CM

## 2021-03-22 DIAGNOSIS — L03119 Cellulitis of unspecified part of limb: Secondary | ICD-10-CM | POA: Diagnosis not present

## 2021-03-22 DIAGNOSIS — I517 Cardiomegaly: Secondary | ICD-10-CM | POA: Diagnosis not present

## 2021-03-22 DIAGNOSIS — D72829 Elevated white blood cell count, unspecified: Secondary | ICD-10-CM | POA: Diagnosis present

## 2021-03-22 DIAGNOSIS — N179 Acute kidney failure, unspecified: Secondary | ICD-10-CM | POA: Diagnosis present

## 2021-03-22 DIAGNOSIS — R6521 Severe sepsis with septic shock: Secondary | ICD-10-CM | POA: Diagnosis not present

## 2021-03-22 LAB — CBC
HCT: 28.4 % — ABNORMAL LOW (ref 39.0–52.0)
Hemoglobin: 9.9 g/dL — ABNORMAL LOW (ref 13.0–17.0)
MCH: 36.7 pg — ABNORMAL HIGH (ref 26.0–34.0)
MCHC: 34.9 g/dL (ref 30.0–36.0)
MCV: 105.2 fL — ABNORMAL HIGH (ref 80.0–100.0)
Platelets: 150 10*3/uL (ref 150–400)
RBC: 2.7 MIL/uL — ABNORMAL LOW (ref 4.22–5.81)
RDW: 13.4 % (ref 11.5–15.5)
WBC: 43.2 10*3/uL — ABNORMAL HIGH (ref 4.0–10.5)
nRBC: 0 % (ref 0.0–0.2)

## 2021-03-22 LAB — ECHOCARDIOGRAM COMPLETE
AR max vel: 2.03 cm2
AV Area VTI: 2.41 cm2
AV Area mean vel: 2.46 cm2
AV Mean grad: 4 mmHg
AV Peak grad: 14.6 mmHg
Ao pk vel: 1.91 m/s
Area-P 1/2: 3.61 cm2
Calc EF: 50.9 %
MV M vel: 4.24 m/s
MV Peak grad: 71.9 mmHg
S' Lateral: 3.9 cm
Single Plane A2C EF: 64.7 %
Single Plane A4C EF: 34.5 %
Weight: 3312.19 oz

## 2021-03-22 LAB — COMPREHENSIVE METABOLIC PANEL
ALT: <5 U/L (ref 0–44)
AST: 33 U/L (ref 15–41)
Albumin: 2.8 g/dL — ABNORMAL LOW (ref 3.5–5.0)
Alkaline Phosphatase: 62 U/L (ref 38–126)
Anion gap: 10 (ref 5–15)
BUN: 37 mg/dL — ABNORMAL HIGH (ref 8–23)
CO2: 23 mmol/L (ref 22–32)
Calcium: 8.5 mg/dL — ABNORMAL LOW (ref 8.9–10.3)
Chloride: 100 mmol/L (ref 98–111)
Creatinine, Ser: 1.6 mg/dL — ABNORMAL HIGH (ref 0.61–1.24)
GFR, Estimated: 42 mL/min — ABNORMAL LOW (ref >60)
Glucose, Bld: 86 mg/dL (ref 70–99)
Potassium: 4.7 mmol/L (ref 3.5–5.1)
Sodium: 133 mmol/L — ABNORMAL LOW (ref 135–145)
Total Bilirubin: 1.1 mg/dL (ref 0.3–1.2)
Total Protein: 5.8 g/dL — ABNORMAL LOW (ref 6.5–8.1)

## 2021-03-22 LAB — TROPONIN I (HIGH SENSITIVITY)
Troponin I (High Sensitivity): 109 ng/L (ref <18)
Troponin I (High Sensitivity): 108 ng/L (ref <18)

## 2021-03-22 LAB — GLUCOSE, CAPILLARY
Glucose-Capillary: 75 mg/dL (ref 70–99)
Glucose-Capillary: 80 mg/dL (ref 70–99)
Glucose-Capillary: 77 mg/dL (ref 70–99)
Glucose-Capillary: 82 mg/dL (ref 70–99)

## 2021-03-22 LAB — PHOSPHORUS: Phosphorus: 2.8 mg/dL (ref 2.5–4.6)

## 2021-03-22 LAB — PROCALCITONIN: Procalcitonin: 57.55 ng/mL

## 2021-03-22 LAB — MAGNESIUM: Magnesium: 1.8 mg/dL (ref 1.7–2.4)

## 2021-03-22 MED ORDER — BETHANECHOL CHLORIDE 10 MG PO TABS
10.0000 mg | ORAL_TABLET | Freq: Three times a day (TID) | ORAL | Status: DC
  Administered 2021-03-22 (×2): 10 mg via ORAL
  Filled 2021-03-22 (×7): qty 1

## 2021-03-22 MED ORDER — HEPARIN SODIUM (PORCINE) 5000 UNIT/ML IJ SOLN: 5000.0000 [IU] | Freq: Three times a day (TID) | INTRAMUSCULAR | Status: DC

## 2021-03-22 MED ORDER — CHLORHEXIDINE GLUCONATE CLOTH 2 % EX PADS
6.0000 | MEDICATED_PAD | Freq: Every day | CUTANEOUS | Status: DC
  Administered 2021-03-23 – 2021-03-24 (×2): 6 via TOPICAL

## 2021-03-22 MED ORDER — TAMSULOSIN HCL 0.4 MG PO CAPS
0.4000 mg | ORAL_CAPSULE | Freq: Every day | ORAL | Status: DC
  Administered 2021-03-22: 0.4 mg via ORAL
  Filled 2021-03-22 (×2): qty 1

## 2021-03-22 MED ORDER — CARBIDOPA-LEVODOPA 25-100 MG PO TABS
2.0000 | ORAL_TABLET | ORAL | Status: AC
  Administered 2021-03-22: 2 via ORAL
  Filled 2021-03-22: qty 2

## 2021-03-22 MED ORDER — CARBIDOPA-LEVODOPA 25-100 MG PO TABS
1.0000 | ORAL_TABLET | ORAL | Status: DC
  Administered 2021-03-22 (×2): 1 via ORAL
  Filled 2021-03-22 (×6): qty 1

## 2021-03-22 MED ORDER — CARBIDOPA-LEVODOPA 25-100 MG PO TABS
2.0000 | ORAL_TABLET | ORAL | Status: DC
  Administered 2021-03-22 – 2021-03-23 (×2): 2 via ORAL
  Filled 2021-03-22 (×7): qty 2

## 2021-03-22 MED ORDER — CARBIDOPA-LEVODOPA ER 50-200 MG PO TBCR
2.0000 | EXTENDED_RELEASE_TABLET | Freq: Every day | ORAL | Status: DC
  Administered 2021-03-22: 2 via ORAL
  Filled 2021-03-22 (×2): qty 2

## 2021-03-22 NOTE — Progress Notes (Signed)
2D echocardiogram completed.  03/22/2021 10:53 AM Kelby Aline., MHA, RVT, RDCS, RDMS

## 2021-03-22 NOTE — Consult Note (Addendum)
[facility] Nurse Consult Note: Reason for Consult: Consult requested for bilat legs and bilat buttocks.   Bilat legs with cellulitis; generalized edema and erythemia, no open wounds or drainage. Pt is on systemic coverage for antibiotics, no topical treatment is indicated at this time. Bilat buttocks with dark red purple deep tissue pressure injury; 6X10cm Pressure Injury POA: Yes Dressing procedure/placement/frequency: Pt is on a low airloss mattress to reduce pressure. Float heels to reduce pressure. Topical treatment orders provided for bedside nurses to perform as follows: Foam dressing to bilat buttocks, change Q 3 days or PRN soiling. Please re-consult if further assistance is needed.  Thank-you,  Julien Girt MSN, [place], [place], [place], [place]

## 2021-03-22 NOTE — Consult Note (Addendum)
Cardiology Consultation:   Patient ID: Zachary Decker MRN: [id number]; DOB: 11-05-1932  Admit date: 03/21/2021 Date of Consult: 03/22/2021  PCP:  [name], MD   [facility] Providers Cardiologist:  Lauree Chandler, MD        Patient Profile:   Zachary Decker is a 87 y.o. male with a hx of HTN, HLD, CAD with s/p 5 v CABG 08/2008, ICM, GERD, BPH  who is being seen 03/22/2021 for the evaluation of bradycardia at the request of Dr. Halford Chessman.  History of Present Illness:   [name]  with above hx, chronic LE edema, EF 45-50% 03/2019, and EF then 45-50%, mild MR.  Last myoview in 02/2013 with scar but no ischemia.  Pt has been diagnosed with Parkinson's disease as well.   Also hx of MSSA bacteremia in 2020, recurrent BLE cellulitis and being treated with keflex on admit.  He was admitted 03/21/21 with weakness, fatigue, and hypotension.  The symptoms began the morning of 9/29 he was lightheaded as well.   EMS called and given epi 0.5 mg.  In ER HR 30s and BP  in 50s,  he had taken lasix and lisinopril that AM.  His BP was lower on recent PCP visits.   EKG:  The EKG was personally reviewed and demonstrates:  SB at 26 and Q wave in III old and no acute ST changes.  HR same 10/2019  Today's EKG A fib rate 50s though terrible baseline artifact Telemetry:  Telemetry was personally reviewed and demonstrates:  sinus brady to 30  Echo today EF 40-45%, LV with RWMA, LV mildly dilated.  There is severe hypokinesis of the left ventricular, entire inferior wall, inferoseptal wall and septal wall.  RV systolic function moderately reduced, mildly elevated PA systolic pressure.  RV systolic pressure is 30.0 mHg mild MVR,   Change from 03/2019 the EF was 45- 50%.    Na 133, K+ 4.7, Cr 1.60 Mg+ 1.8,  BNP 765 Hs troponin 11 & 14  then today 108 and 109 Hgb 10, WBC 38.3 plts 158  today WBC 43.2  Hgb 9.9 and plts 150  TSH 2.185 Lactic acid was 3.0.   procalcitonin was 57.55 today up from 2.36    BP 58/26 on arrival and now improved at one point to 143.84 then back to 74/58  P down to 45 at times and even in 30s.  .  Temp axillary 97.7  Temp on arrival 92.5  on pressors.  PCXR cardiomegaly with subsegmental atelectasis in base.  Chronic scarring at rt base.   Urine culture 40,000 colonies/ml pseudomonas aeruginosa    Today pt was made DNR.   Past Medical History:  Diagnosis Date   Acute myocardial infarction of inferoposterior wall, subsequent episode of care [facility])    CAD (coronary artery disease)    Encounter for long-term (current) use of other medications    Family history of malignant neoplasm of gastrointestinal tract    GERD (gastroesophageal reflux disease)    Hypercholesterolemia    Hypertension    Hypertrophy of prostate with urinary obstruction and other lower urinary tract symptoms (LUTS)    Personal history of thrombophlebitis    PVD (peripheral vascular disease) ([place])    Respiratory failure ([place])    Unspecified adverse effect of unspecified drug, medicinal and biological substance    Unspecified hypothyroidism     Past Surgical History:  Procedure Laterality Date   BIOPSY  04/28/2019   Procedure: BIOPSY;  Surgeon: Tarri Glenn,  Joelene Millin, MD;  Location: [facility] ENDOSCOPY;  Service: Gastroenterology;;   CATARACT EXTRACTION, BILATERAL     CHOLECYSTECTOMY  02/26/2012   Procedure: LAPAROSCOPIC CHOLECYSTECTOMY WITH INTRAOPERATIVE CHOLANGIOGRAM;  Surgeon: Madilyn Hook, DO;  Location: [place];  Service: General;  Laterality: N/A;   CORONARY ARTERY BYPASS GRAFT     x 5 on August 29, 2008   ESOPHAGOGASTRODUODENOSCOPY (EGD) WITH PROPOFOL N/A 04/28/2019   Procedure: ESOPHAGOGASTRODUODENOSCOPY (EGD) WITH PROPOFOL;  Surgeon: Thornton Park, MD;  Location: [place];  Service: Gastroenterology;  Laterality: N/A;   REPLACEMENT TOTAL KNEE Left    tachecostomy placement     after heart attack   TONSILLECTOMY       Home Medications:  Prior to Admission medications   Medication  Sig Start Date End Date Taking? Authorizing Provider  Acetaminophen (TYLENOL EXTRA STRENGTH PO) Take 1-2 tablets by mouth daily as needed (for back pain).   Yes [provider]  Ascorbic Acid (VITAMIN C) 1000 MG tablet Take 1,000 mg by mouth daily.   Yes [provider]  aspirin EC 81 MG tablet Take 81 mg by mouth daily. Swallow whole.   Yes [provider]  carbidopa-levodopa (SINEMET CR) 50-200 MG tablet Take 1 tablet by mouth at bedtime. Patient taking differently: No sig reported 11/15/20  Yes Tat, Eustace Quail, DO  carbidopa-levodopa (SINEMET) 25-100 MG tablet Take 2 at 7am, 2 at 11am, 1 at 3pm, 1 at 7pm 11/15/20  Yes Tat, Wells Guiles S, DO  cephALEXin (KEFLEX) 500 MG capsule Take 1 capsule (500 mg total) by mouth 3 (three) times daily. 03/19/21  Yes [name], MD  Cholecalciferol (VITAMIN D3) 125 MCG (5000 UT) TBDP Take 1 tablet by mouth daily.    Yes [provider]  furosemide (LASIX) 40 MG tablet TAKE 1 TABLET BY MOUTH EVERY DAY Patient taking differently: Take 40 mg by mouth daily. 08/14/20  Yes Burnell Blanks, MD  hydrOXYzine (ATARAX/VISTARIL) 10 MG tablet TAKE 1 TABLET BY MOUTH AT BEDTIME AS NEEDED Patient taking differently: Take 10 mg by mouth at bedtime. 10/13/19  Yes [name], MD  levothyroxine (SYNTHROID) 50 MCG tablet TAKE 1 TABLET BY MOUTH DAILY BEFORE BREAKFAST 08/14/20  Yes [name], MD  lisinopril (ZESTRIL) 2.5 MG tablet TAKE 1 TABLET BY MOUTH EVERY DAY 08/14/20  Yes [name], MD  nitroGLYCERIN (NITROSTAT) 0.4 MG SL tablet Place 1 tablet (0.4 mg total) under the tongue every 5 (five) minutes as needed for chest pain. 04/13/18  Yes [name], MD  pantoprazole (PROTONIX) 40 MG tablet TAKE 1 TABLET BY MOUTH TWICE A DAY Patient taking differently: Take 40 mg by mouth daily. One after lunch, One after dinner 01/07/21  Yes [name], MD  simvastatin (ZOCOR) 20 MG tablet TAKE 1 TABLET BY MOUTH EVERYDAY  AT BEDTIME Patient taking differently: Take 20 mg by mouth at bedtime. 03/05/20  Yes [name], MD  tamsulosin (FLOMAX) 0.4 MG CAPS capsule TAKE 1 CAPSULE BY MOUTH EVERY DAY Patient taking differently: Take 0.4 mg by mouth daily. 05/14/20  Yes [name], MD  vitamin B-12 (CYANOCOBALAMIN) 500 MCG tablet Take 500 mcg by mouth daily.   Yes [provider]  zinc sulfate 220 (50 Zn) MG capsule Take 220 mg by mouth daily.   Yes [provider]  B Complex-C-Folic Acid (FOLBEE PLUS) TABS TAKE 1 TABLET BY MOUTH EVERY DAY Patient not taking: Reported on 03/21/2021 02/20/21   [name], MD  Inpatient Medications: Scheduled Meds:  aspirin EC  81 mg Oral Daily   carbidopa-levodopa  2 tablet Oral QHS   carbidopa-levodopa  2 tablet Oral 2 times per day   And   carbidopa-levodopa  1 tablet Oral 2 times per day   Chlorhexidine Gluconate Cloth  6 each Topical Daily   heparin  5,000 Units Subcutaneous Q8H   levothyroxine  50 mcg Oral QAC breakfast   pantoprazole  40 mg Oral BID   simvastatin  10 mg Oral q1800   vitamin B-12  500 mcg Oral Daily   Continuous Infusions:  sodium chloride 10 mL/hr at 03/22/21 0600   ceFEPime (MAXIPIME) IV     norepinephrine (LEVOPHED) Adult infusion 5 mcg/min (03/22/21 0651)   vancomycin     PRN Meds: acetaminophen, docusate sodium, polyethylene glycol  Allergies:    Allergies  Allergen Reactions   Losartan Potassium Other (See Comments)    Not known (was told he had an allergy)   Naproxen Nausea And Vomiting   Penicillins Rash    Did it involve swelling of the face/tongue/throat, SOB, or low BP? No Did it involve sudden or severe rash/hives, skin peeling, or any reaction on the inside of your mouth or nose? Yes Did you need to seek medical attention at a hospital or doctor's office? Unknown When did it last happen?       If all above answers are "NO", may proceed with cephalosporin use.  TOLERATED CEFTRIAXONE 10/07/2018     Social History:   Social History   Socioeconomic History   Marital status: Married    Spouse name: Apolonio Schneiders   Number of children: 2   Years of education: Not on file   Highest education level: Master's degree (e.g., MA, MS, MEng, MEd, MSW, MBA)  Occupational History    Comment: Retired Careers adviser   Tobacco Use   Smoking status: Never   Smokeless tobacco: Never  Vaping Use   Vaping Use: Never used  Substance and Sexual Activity   Alcohol use: No   Drug use: No   Sexual activity: Not on file  Other Topics Concern   Not on file  Social History Narrative   Married 1958. 2 kids (boy, girl). 2 grandkids (boy and girl).       Retired from [employer] in [place]      [place]: former Air cabin crew, tv, reading      Right handed      Lives in a single story home with wife and Daughter Linus Orn.   Social Determinants of Health   Financial Resource Strain: Low Risk    Difficulty of Paying Living Expenses: Not hard at all  Food Insecurity: No Food Insecurity   Worried About Charity fundraiser in the Last Year: Never true   [place] in the Last Year: Never true  Transportation Needs: No Transportation Needs   Lack of Transportation (Medical): No   Lack of Transportation (Non-Medical): No  Physical Activity: Sufficiently Active   Days of Exercise per Week: 7 days   Minutes of Exercise per Session: 30 min  Stress: No Stress Concern Present   Feeling of Stress : Not at all  Social Connections: Moderately Integrated   Frequency of Communication with Friends and Family: More than three times a week   Frequency of Social Gatherings with Friends and Family: Never   Attends Religious Services: More than 4 times per year   Active Member of Clubs or Organizations: No  Attends Archivist Meetings: Never   Marital Status: Married  Human resources officer Violence: Not At Risk   Fear of Current or Ex-Partner: No   Emotionally Abused: No   Physically Abused: No    Sexually Abused: No    Family History:    Family History  Problem Relation Age of Onset   Heart disease Mother    Tuberculosis Father    Colon cancer Brother      ROS:  Please see the history of present illness.  General:no colds or fevers, no weight changes Skin:no rashes or ulcers HEENT:no blurred vision, no congestion CV:see HPI PUL:see HPI GI:no diarrhea constipation or melena, no indigestion GU:no hematuria, no dysuria- hx of prostate with urinary obstruction  MS:no joint pain, no claudication Neuro:no syncope, no lightheadedness Endo:no diabetes, no thyroid disease  All other ROS reviewed and negative.     Physical Exam/Data:   Vitals:   03/22/21 0645 03/22/21 0700 03/22/21 0727 03/22/21 1138  BP:      Pulse: (!) 56 (!) 59    Resp: 19 18    Temp:   97.7 F (36.5 C) 97.7 F (36.5 C)  TempSrc:   Oral Axillary  SpO2: 92% 92%    Weight:        Intake/Output Summary (Last 24 hours) at 03/22/2021 1347 Last data filed at 03/22/2021 0600 Gross per 24 hour  Intake 3110.3 ml  Output 850 ml  Net 2260.3 ml   Last 3 Weights 03/22/2021 03/19/2021 12/17/2020  Weight (lbs) 207 lb 0.2 oz 197 lb 203 lb 9.6 oz  Weight (kg) 93.9 kg 89.359 kg 92.352 kg     Body mass index is 27.31 kg/m.  General:  Well nourished, well developed, in no acute distress HEENT: normal Neck: no JVD Vascular: No carotid bruits; Distal pulses 2+ bilaterally Cardiac:  normal S1, S2; RRR; no murmur gallup rub or click Lungs:  clear to auscultation bilaterally, no wheezing, rhonchi or rales  Abd: soft, nontender, no hepatomegaly  Ext: 3+ edema Rt leg bright red, pink on left.   Musculoskeletal:  No deformities, BUE and BLE strength normal and equal Skin: warm and dry  Neuro:  CNs 2-12 intact, no focal abnormalities noted Psych:  Normal affect   Relevant CV Studies: Echo 03/22/21 IMPRESSIONS    1. Left ventricular ejection fraction, by estimation, is 40 to 45%. The  left ventricle has mildly  decreased function. The left ventricle  demonstrates regional wall motion abnormalities (see scoring  diagram/findings for description). The left ventricular   internal cavity size was mildly dilated. Left ventricular diastolic  parameters were normal. There is severe hypokinesis of the left  ventricular, entire inferior wall, inferoseptal wall and septal wall.   2. Right ventricular systolic function is moderately reduced. The right  ventricular size is mildly enlarged. There is mildly elevated pulmonary  artery systolic pressure. The estimated right ventricular systolic  pressure is 86.3 mmHg.   3. Left atrial size was mildly dilated.   4. Ischemic tethering of the posterior leaflet is noted. The mitral valve  is abnormal. Mild mitral valve regurgitation.   5. The aortic valve is tricuspid. Aortic valve regurgitation is trivial.  Mild to moderate aortic valve sclerosis/calcification is present, without  any evidence of aortic stenosis. Aortic valve mean gradient measures 4.0  mmHg.   6. The inferior vena cava is dilated in size with <50% respiratory  variability, suggesting right atrial pressure of 15 mmHg.   Comparison(s): Changes from prior study  are noted. 04/23/2019: LVEF  45-50%, basal to mid inferior hypokinesis.   FINDINGS   Left Ventricle: Left ventricular ejection fraction, by estimation, is 40  to 45%. The left ventricle has mildly decreased function. The left  ventricle demonstrates regional wall motion abnormalities. Severe  hypokinesis of the left ventricular, entire  inferior wall, inferoseptal wall and septal wall. The left ventricular  internal cavity size was mildly dilated. There is no left ventricular  hypertrophy. Left ventricular diastolic parameters were normal.   Right Ventricle: The right ventricular size is mildly enlarged. No  increase in right ventricular wall thickness. Right ventricular systolic  function is moderately reduced. There is mildly elevated  pulmonary artery  systolic pressure. The tricuspid  regurgitant velocity is 2.56 m/s, and with an assumed right atrial  pressure of 15 mmHg, the estimated right ventricular systolic pressure is  55.9 mmHg.   Left Atrium: Left atrial size was mildly dilated.   Right Atrium: Right atrial size was normal in size.   Pericardium: There is no evidence of pericardial effusion.   Mitral Valve: Ischemic tethering of the posterior leaflet is noted. The  mitral valve is abnormal. Mild mitral valve regurgitation, with  posteriorly-directed jet.   Tricuspid Valve: The tricuspid valve is grossly normal. Tricuspid valve  regurgitation is mild.   Aortic Valve: The aortic valve is tricuspid. Aortic valve regurgitation is  trivial. Mild to moderate aortic valve sclerosis/calcification is present,  without any evidence of aortic stenosis. Aortic valve mean gradient  measures 4.0 mmHg. Aortic valve peak   gradient measures 14.6 mmHg. Aortic valve area, by VTI measures 2.41 cm.   Pulmonic Valve: The pulmonic valve was grossly normal. Pulmonic valve  regurgitation is trivial.   Aorta: The aortic root and ascending aorta are structurally normal, with  no evidence of dilitation.   Venous: The inferior vena cava is dilated in size with less than 50%  respiratory variability, suggesting right atrial pressure of 15 mmHg.   IAS/Shunts: No atrial level shunt detected by color flow Doppler.       Laboratory Data:  High Sensitivity Troponin:   Recent Labs  Lab 03/21/21 1502 03/21/21 1913 03/22/21 0909 03/22/21 1041  TROPONINIHS 11 14 109* 108*     Chemistry Recent Labs  Lab 03/21/21 1502 03/21/21 1541 03/21/21 1542 03/22/21 0143  NA 133* 134* 134* 133*  K 4.3 4.3 4.4 4.7  CL 99 97*  --  100  CO2 24  --   --  23  GLUCOSE 93 93  --  86  BUN 41* 49*  --  37*  CREATININE 1.84* 1.90*  --  1.60*  CALCIUM 8.6*  --   --  8.5*  MG 1.9  --   --  1.8  GFRNONAA 35*  --   --  42*  ANIONGAP 10   --   --  10    Recent Labs  Lab 03/21/21 1502 03/22/21 0143  PROT 5.8* 5.8*  ALBUMIN 2.9* 2.8*  AST 41 33  ALT 7 <5  ALKPHOS 60 62  BILITOT 0.6 1.1   Lipids No results for input(s): CHOL, TRIG, HDL, LABVLDL, LDLCALC, CHOLHDL in the last 168 hours.  Hematology Recent Labs  Lab 03/21/21 1502 03/21/21 1541 03/21/21 1542 03/21/21 1659 03/22/21 0143  WBC 38.3*  --   --  38.9* 43.2*  RBC 2.73*  --   --  2.57* 2.70*  HGB 10.0*   < > 10.5* 9.4* 9.9*  HCT 29.6*   < >  31.0* 28.1* 28.4*  MCV 108.4*  --   --  109.3* 105.2*  MCH 36.6*  --   --  36.6* 36.7*  MCHC 33.8  --   --  33.5 34.9  RDW 13.4  --   --  13.3 13.4  PLT 158  --   --  155 150   < > = values in this interval not displayed.   Thyroid  Recent Labs  Lab 03/21/21 1913  TSH 2.185    BNP Recent Labs  Lab 03/21/21 1913  BNP 765.6*    DDimer No results for input(s): DDIMER in the last 168 hours.   Radiology/Studies:  DG Chest Port 1 View  Result Date: 03/21/2021 CLINICAL DATA:  Altered mental status and cellulitis EXAM: PORTABLE CHEST 1 VIEW COMPARISON:  10/07/2018 FINDINGS: Post sternotomy changes. Chronic pleural changes at the right CP angle with elevated right diaphragm. Cardiomegaly. Subsegmental atelectasis left base. Aortic atherosclerosis. No pneumothorax. IMPRESSION: Chronic scarring at the right base. Cardiomegaly with subsegmental atelectasis the base Electronically Signed   By: Donavan Foil M.D.   On: 03/21/2021 15:40   ECHOCARDIOGRAM COMPLETE  Result Date: 03/22/2021    ECHOCARDIOGRAM REPORT   Patient Name:   Zachary Decker Date of Exam: 03/22/2021 Medical Rec #:  [id number]       Height:       73.0 in Accession #:    [id number]      Weight:       207.0 lb Date of Birth:  [birth year]      BSA:          2.183 m Patient Age:    52 years        BP:           101/49 mmHg Patient Gender: M               HR:           56 bpm. Exam Location:  Inpatient Procedure: 2D Echo, Color Doppler and Cardiac Doppler  Indications:    Cardiomegaly  History:        Patient has prior history of Echocardiogram examinations, most                 recent 04/23/2019. Acute MI and CAD; Risk Factors:Dyslipidemia                 and Hypertension.  Sonographer:    Maudry Mayhew MHA, RDMS, RVT, RDCS Referring Phys: [id number] Mick Sell  Sonographer Comments: Image acquisition challenging due to uncooperative patient. IMPRESSIONS  1. Left ventricular ejection fraction, by estimation, is 40 to 45%. The left ventricle has mildly decreased function. The left ventricle demonstrates regional wall motion abnormalities (see scoring diagram/findings for description). The left ventricular  internal cavity size was mildly dilated. Left ventricular diastolic parameters were normal. There is severe hypokinesis of the left ventricular, entire inferior wall, inferoseptal wall and septal wall.  2. Right ventricular systolic function is moderately reduced. The right ventricular size is mildly enlarged. There is mildly elevated pulmonary artery systolic pressure. The estimated right ventricular systolic pressure is 92.4 mmHg.  3. Left atrial size was mildly dilated.  4. Ischemic tethering of the posterior leaflet is noted. The mitral valve is abnormal. Mild mitral valve regurgitation.  5. The aortic valve is tricuspid. Aortic valve regurgitation is trivial. Mild to moderate aortic valve sclerosis/calcification is present, without any evidence of aortic stenosis. Aortic valve mean gradient measures 4.0 mmHg.  6. The  inferior vena cava is dilated in size with <50% respiratory variability, suggesting right atrial pressure of 15 mmHg. Comparison(s): Changes from prior study are noted. 04/23/2019: LVEF 45-50%, basal to mid inferior hypokinesis. FINDINGS  Left Ventricle: Left ventricular ejection fraction, by estimation, is 40 to 45%. The left ventricle has mildly decreased function. The left ventricle demonstrates regional wall motion abnormalities. Severe  hypokinesis of the left ventricular, entire inferior wall, inferoseptal wall and septal wall. The left ventricular internal cavity size was mildly dilated. There is no left ventricular hypertrophy. Left ventricular diastolic parameters were normal. Right Ventricle: The right ventricular size is mildly enlarged. No increase in right ventricular wall thickness. Right ventricular systolic function is moderately reduced. There is mildly elevated pulmonary artery systolic pressure. The tricuspid regurgitant velocity is 2.56 m/s, and with an assumed right atrial pressure of 15 mmHg, the estimated right ventricular systolic pressure is 40.3 mmHg. Left Atrium: Left atrial size was mildly dilated. Right Atrium: Right atrial size was normal in size. Pericardium: There is no evidence of pericardial effusion. Mitral Valve: Ischemic tethering of the posterior leaflet is noted. The mitral valve is abnormal. Mild mitral valve regurgitation, with posteriorly-directed jet. Tricuspid Valve: The tricuspid valve is grossly normal. Tricuspid valve regurgitation is mild. Aortic Valve: The aortic valve is tricuspid. Aortic valve regurgitation is trivial. Mild to moderate aortic valve sclerosis/calcification is present, without any evidence of aortic stenosis. Aortic valve mean gradient measures 4.0 mmHg. Aortic valve peak  gradient measures 14.6 mmHg. Aortic valve area, by VTI measures 2.41 cm. Pulmonic Valve: The pulmonic valve was grossly normal. Pulmonic valve regurgitation is trivial. Aorta: The aortic root and ascending aorta are structurally normal, with no evidence of dilitation. Venous: The inferior vena cava is dilated in size with less than 50% respiratory variability, suggesting right atrial pressure of 15 mmHg. IAS/Shunts: No atrial level shunt detected by color flow Doppler.  LEFT VENTRICLE PLAX 2D LVIDd:         6.10 cm      Diastology LVIDs:         3.90 cm      LV e' medial:    6.90 cm/s LV PW:         0.80 cm      LV  E/e' medial:  13.9 LV IVS:        0.70 cm      LV e' lateral:   13.80 cm/s LVOT diam:     2.20 cm      LV E/e' lateral: 6.9 LV SV:         81 LV SV Index:   37 LVOT Area:     3.80 cm  LV Volumes (MOD) LV vol d, MOD A2C: 103.9 ml LV vol d, MOD A4C: 98.7 ml LV vol s, MOD A2C: 36.7 ml LV vol s, MOD A4C: 64.6 ml LV SV MOD A2C:     67.2 ml LV SV MOD A4C:     98.7 ml LV SV MOD BP:      52.7 ml RIGHT VENTRICLE TAPSE (M-mode): 0.6 cm LEFT ATRIUM              Index       RIGHT ATRIUM           Index LA diam:        4.50 cm  2.06 cm/m  RA Area:     22.50 cm LA Vol (A2C):   103.0 ml 47.17 ml/m RA Volume:   62.20 ml  28.49 ml/m LA Vol (A4C):   45.8 ml  20.98 ml/m LA Biplane Vol: 67.4 ml  30.87 ml/m  AORTIC VALVE AV Area (Vmax):    2.03 cm AV Area (Vmean):   2.46 cm AV Area (VTI):     2.41 cm AV Vmax:           191.00 cm/s AV Vmean:          94.100 cm/s AV VTI:            0.337 m AV Peak Grad:      14.6 mmHg AV Mean Grad:      4.0 mmHg LVOT Vmax:         102.00 cm/s LVOT Vmean:        60.900 cm/s LVOT VTI:          0.214 m LVOT/AV VTI ratio: 0.64  AORTA Ao Root diam: 3.40 cm MITRAL VALVE               TRICUSPID VALVE MV Area (PHT): 3.61 cm    TR Peak grad:   26.2 mmHg MV Decel Time: 210 msec    TR Vmax:        256.00 cm/s MR Peak grad: 71.9 mmHg MR Mean grad: 37.0 mmHg    SHUNTS MR Vmax:      424.00 cm/s  Systemic VTI:  0.21 m MR Vmean:     282.0 cm/s   Systemic Diam: 2.20 cm MV E velocity: 95.65 cm/s MV A velocity: 93.00 cm/s MV E/A ratio:  1.03 Lyman Bishop MD Electronically signed by Lyman Bishop MD Signature Date/Time: 03/22/2021/12:37:09 PM    Final      Assessment and Plan:   Bradycardia most likely from severe infection - pt is now DNR, difficult to use temp pacer with ongoing sepsis, will defer to Dr. Claiborne Billings.  Could use dopamine to increase HR.   Hypotension/sepsis /cellulitis on pressors norepinephrine per CCM Elevated troponin but low elevation and very minor decrease in EF.  Agree continue ASA/zocor  no BB with slow HR. Most likely  demand ischemia Hx Htn lisinopril on hold  Hx of CAD with CABG in 2010, and last cath pre cabg, nuc study 2014 neg for ischemia.   ICM from 2010 had improved and now decreased slightly.  No BB on pressors for BP with hypotension and sepsis.   Parkinson's disease.      For questions or updates, please contact [place] Please consult [email or web address] for contact info under    Signed, Cecilie Kicks, NP  03/22/2021 1:47 PM  Patient seen and examined. Agree with assessment and plan.  [name] is an 87 year old gentleman who is followed by Dr. Angelena Form for his cardiology care.  He has known CAD and underwent CABG revascularization x5 in March 2010 and subsequently has had an ischemic cardiomyopathy with EF on his most recent echo done today at 66 -45%.  Myoview study in 2014 showed scar without ischemia.  He has a history of Parkinson's disease with mild tremor.  He has had issues with recurrent bilateral lower extremity cellulitis and edema.  He was admitted on March 21, 2021 with weakness, fatigue, and hypotension.  He has been noted to be bradycardic with heart rates in the 30s to 40s and initial low blood pressure.  He had previously been on Lasix and lisinopril which were taken the morning of admission but was was discontinued on admission.  He denies any chest pain or recurrent anginal symptomatology.  His echo Doppler today showed severe hypokinesis of the inferior inferoseptal and septal wall with moderately reduced RV function, and mildly elevated PA systolic pressure estimated 41 mmHg with mild MR and mild to moderate aortic sclerosis.  Presently, his blood pressure is stable on inotropic support with Levophed with blood pressure 130/50 and heart rate at 51 bpm.  He is frail in appearance.  There is no JVD.  Lungs are clear.  Rhythm is bradycardic and fairly regular with 1/6 systolic murmur.  Abdomen is nontender.  He has significant cellulitis  bilaterally of both lower extremities with 3+ ankle edema. He is on cefepime and IV and vancomycin for antibiotic therapy.  Apparently the patient was recently made a DNR today.  ECGs have shown possible sinus bradycardia with heart rate in the 50s with difficult to discern P waves due to baseline tremor but other ECG suggest possible atrial fibrillation.  High-sensitivity troponin is mildly elevated and most likely consistent with demand ischemia resulting from his hypotension/sepsis/cellulitis.  Blood pressure decreases her heart rate slows down further consider possible addition of dopamine and external pacemaker.  He has CKD with creatinine 1.6 consistent with stage IIIb CKD.  BMP is increased at 765 consistent with a component of probable diastolic heart failure.  Procalcitonin is significantly increased at 57 consistent with his inflammatory response from sepsis.  He has macrocytic anemia with MCV 105 consider checking B12 or folate if not done recently.  TSH is normal at 2.185 . Repeat ECG in a.m. Will follow.   Troy Sine, MD, [facility] 03/22/2021 3:24 PM

## 2021-03-22 NOTE — Progress Notes (Addendum)
Spoke with daughter Olivia Mackie at bedside. She wanted to address code status. She states that her mother was not aware that Mr. Dooly yesterday stated that he would want to be full code and have everything done. She states that she does not want to go against his wishes and would like for [place] to change his code status back to full code. She is not sure if Mr. Zuercher has a living will but states that he was in his right state of mind yesterday when he wanted to be a full code and have everything done. I expressed with daughter that we should have a family meeting with the family to discuss goals of care. Changed code status back to full code.   JD Rexene Agent [facility] 03/22/2021, 4:10 PM  Please see [email or web address] for pager details.  From 7A-7P if no response, please call [phone]. After hours, please call ELink [phone].

## 2021-03-22 NOTE — Progress Notes (Signed)
Date and time results received: 03/22/21 1055 (use smartphrase ".now" to insert current time)  Test: Troponin Critical Value: 109  Name of Provider Notified: PA JD Rollene Rotunda  Orders Received? Or Actions Taken?: see orders

## 2021-03-22 NOTE — Progress Notes (Addendum)
NAME:  Zachary Decker, MRN:  [id number], DOB:  Feb 01, 1933, LOS: 1 ADMISSION DATE:  03/21/2021, CONSULTATION DATE:  9/29 REFERRING MD:  Dr. Billy Fischer, CHIEF COMPLAINT:  Septic Shock   History of Present Illness:  Patient is an 87 YO M with pertinent past medical history of HTN, HLD, hypothyroidism, PVD, and MSSA bacteremia in 2020, recurrent BLE cellulitis being treated treated currently with Keflex presents to [facility] on 9/29 with generalized weakness, fatigue, and hypotension.  On 9/29, patient states generalized weakness, fatigue, lightheadedness began that morning.  Patient was hypotensive and EMS was called.  Patient received 0.5 mg of epi prior to coming to [facility] ED.  Upon arrival to ED, SBP in the 50-60s.  Patient takes Lasix and lisinopril for HTN which was recently cut in half after recent primary care visit.  Patient denies fever, nausea, vomiting, diarrhea, cough, shortness of breath, chest pain, abdominal pain.  ER labs: Lactic 2.7, WBC 38.3, Hgb 10, Na 133, BUN 41, creat 1.84, troponin 11, procalcitonin, UA and urine culture pending, Bcx2 pending. CXR chronic scarring at the right base; cardiomegaly w/ atelectasis at the base.   Patient was given 2 L of IV fluid boluses and awaiting 3rd bolus. Started on Levo. Started cefepime and vanc. Patient fever 33 Celsius. Placed on bare hugger.  PCCM asked to admit to ICU while on pressors.  Pertinent  Medical History   Past Medical History:  Diagnosis Date   Acute myocardial infarction of inferoposterior wall, subsequent episode of care [facility])    CAD (coronary artery disease)    Encounter for long-term (current) use of other medications    Family history of malignant neoplasm of gastrointestinal tract    GERD (gastroesophageal reflux disease)    Hypercholesterolemia    Hypertension    Hypertrophy of prostate with urinary obstruction and other lower urinary tract symptoms (LUTS)    Personal history of thrombophlebitis    PVD (peripheral  vascular disease) ([place])    Respiratory failure ([place])    Unspecified adverse effect of unspecified drug, medicinal and biological substance    Unspecified hypothyroidism      Significant Hospital Events: Including procedures, antibiotic start and stop dates in addition to other pertinent events   9/29: admitted to ICU for septic shock needing levo; started cefepime, vanc, flagyl 9/30: remains on 2.5 levo; HR in 40s  Interim History / Subjective:  Patient mental status mildly improving from yesterday; remains confused and pulling at things occasionally. On 2.5 mcg of levo; MAP 75 HR 40s IV fluids running 150  BLE Cellulitis remains present; R worse than L  Objective   Blood pressure (!) 101/49, pulse (!) 59, temperature 97.7 F (36.5 C), temperature source Oral, resp. rate 18, weight 93.9 kg, SpO2 92 %.        Intake/Output Summary (Last 24 hours) at 03/22/2021 0736 Last data filed at 03/22/2021 0600 Gross per 24 hour  Intake 3110.3 ml  Output 850 ml  Net 2260.3 ml    Filed Weights   03/22/21 0500  Weight: 93.9 kg    Examination: General:   ill appearing elderly male HEENT: MM pink/moist Neuro: altered/confused; able to answer most questions CV: s1s2, bradycardia, no m/r/g; on 2.5 mcg of levo PULM:  dim BS bilaterally; on room air GI: soft, bsx4 active; no abdominal pain to palpation Extremities: warm/dry, severe cellulitis [facility] Problem list     Assessment & Plan:  Septic shock: possibly from his recurrent cellulitis  Hypothermia: improved BLE cellulitis: R > L P: -continue to monitor in ICU -continue to wean levo for MAP >65 -continue IV fluids 150 -continue cefepime, vanc -trend cbc/fever -follow Bcx2, urine culture, procalcitonin -will send expectorated sputum -check echo  Bradycardia w/ possible afib Elevated BNP P: -check EKG -check echo -check troponin -will call cards to consult  Acute metabolic encephalopathy Hx of  parkinsons P: -Hold sedating medication -frequent neuro checks -continue home sinemet  AKI: slowly improving P: -Trend BMP / urinary output -Replace electrolytes as indicated -Avoid nephrotoxic agents, ensure adequate renal perfusion  Mild hyponatremia P: -trend bmp  Hx of HTN, HLD, PVD P: -hold home bp meds -continue statin -continue ASA  GERD P: -PPI  Hx of Hypothyroidism P: -continue synthroid  Hx of hypertrophy of prostate w/ urinary obstruction and LUTS P: -hold flomax due to hypotension -monitor UOP   Best Practice (right click and "Reselect all SmartList Selections" daily)   Diet/type: NPO w/ oral meds DVT prophylaxis: prophylactic heparin  GI prophylaxis: PPI Lines: N/A Foley:  N/A Code Status:  full code Last date of multidisciplinary goals of care discussion [9/29: spoke with daughter and patient at bedside]  Critical care time: 35 minutes    JD Rexene Agent [facility] 03/22/2021, 7:36 AM  Please see [email or web address] for pager details.  From 7A-7P if no response, please call [phone]. After hours, please call ELink [phone].

## 2021-03-22 NOTE — Progress Notes (Signed)
Spoke with patients wife, Apolonio Schneiders, and updated her over the phone on Mr. Holtsclaw. I asked about patients wishes if her heart were to stop and she expressed that he is a DNR and that he would not want to be intubated on the ventilator. Code status changed in the chart to DNR.  Ethelene [facility] 03/22/2021, 12:36 PM  Please see [email or web address] for pager details.  From 7A-7P if no response, please call [phone]. After hours, please call ELink [phone].

## 2021-03-23 DIAGNOSIS — A419 Sepsis, unspecified organism: Secondary | ICD-10-CM | POA: Diagnosis not present

## 2021-03-23 DIAGNOSIS — R6521 Severe sepsis with septic shock: Secondary | ICD-10-CM | POA: Diagnosis not present

## 2021-03-23 DIAGNOSIS — R001 Bradycardia, unspecified: Secondary | ICD-10-CM | POA: Diagnosis not present

## 2021-03-23 LAB — GLUCOSE, CAPILLARY: Glucose-Capillary: 72 mg/dL (ref 70–99)

## 2021-03-23 LAB — COMPREHENSIVE METABOLIC PANEL
ALT: <5 U/L (ref 0–44)
AST: 51 U/L — ABNORMAL HIGH (ref 15–41)
Albumin: 2.5 g/dL — ABNORMAL LOW (ref 3.5–5.0)
Alkaline Phosphatase: 61 U/L (ref 38–126)
Anion gap: 8 (ref 5–15)
BUN: 35 mg/dL — ABNORMAL HIGH (ref 8–23)
CO2: 22 mmol/L (ref 22–32)
Calcium: 8.4 mg/dL — ABNORMAL LOW (ref 8.9–10.3)
Chloride: 104 mmol/L (ref 98–111)
Creatinine, Ser: 1.54 mg/dL — ABNORMAL HIGH (ref 0.61–1.24)
GFR, Estimated: 44 mL/min — ABNORMAL LOW (ref >60)
Glucose, Bld: 78 mg/dL (ref 70–99)
Potassium: 4.4 mmol/L (ref 3.5–5.1)
Sodium: 134 mmol/L — ABNORMAL LOW (ref 135–145)
Total Bilirubin: 0.7 mg/dL (ref 0.3–1.2)
Total Protein: 5.3 g/dL — ABNORMAL LOW (ref 6.5–8.1)

## 2021-03-23 LAB — CBC
HCT: 27 % — ABNORMAL LOW (ref 39.0–52.0)
Hemoglobin: 9.2 g/dL — ABNORMAL LOW (ref 13.0–17.0)
MCH: 36.4 pg — ABNORMAL HIGH (ref 26.0–34.0)
MCHC: 34.1 g/dL (ref 30.0–36.0)
MCV: 106.7 fL — ABNORMAL HIGH (ref 80.0–100.0)
Platelets: 128 10*3/uL — ABNORMAL LOW (ref 150–400)
RBC: 2.53 MIL/uL — ABNORMAL LOW (ref 4.22–5.81)
RDW: 13.8 % (ref 11.5–15.5)
WBC: 29.3 10*3/uL — ABNORMAL HIGH (ref 4.0–10.5)
nRBC: 0 % (ref 0.0–0.2)

## 2021-03-23 LAB — MAGNESIUM: Magnesium: 1.8 mg/dL (ref 1.7–2.4)

## 2021-03-23 LAB — URINE CULTURE: Culture: 40000 — AB

## 2021-03-23 LAB — PROCALCITONIN: Procalcitonin: 1.35 ng/mL

## 2021-03-23 MED ORDER — ORAL CARE MOUTH RINSE
15.0000 mL | Freq: Two times a day (BID) | OROMUCOSAL | Status: DC
  Administered 2021-03-23: 15 mL via OROMUCOSAL

## 2021-03-23 MED ORDER — B COMPLEX-C PO TABS
1.0000 | ORAL_TABLET | Freq: Every day | ORAL | Status: DC
  Filled 2021-03-23: qty 1

## 2021-03-23 MED ORDER — HEPARIN SODIUM (PORCINE) 5000 UNIT/ML IJ SOLN: 5000.0000 [IU] | Freq: Three times a day (TID) | INTRAMUSCULAR | Status: DC

## 2021-03-23 MED ORDER — ZINC SULFATE 220 (50 ZN) MG PO CAPS
220.0000 mg | ORAL_CAPSULE | Freq: Every day | ORAL | Status: DC
  Filled 2021-03-23: qty 1

## 2021-03-23 MED ORDER — MAGNESIUM SULFATE 2 GM/50ML IV SOLN
2.0000 g | Freq: Once | INTRAVENOUS | Status: AC
  Administered 2021-03-23: 2 g via INTRAVENOUS
  Filled 2021-03-23: qty 50

## 2021-03-23 MED ORDER — VITAMIN D 25 MCG (1000 UNIT) PO TABS
5000.0000 [IU] | ORAL_TABLET | Freq: Every day | ORAL | Status: DC
  Filled 2021-03-23: qty 5

## 2021-03-23 MED ORDER — CHLORHEXIDINE GLUCONATE 0.12 % MT SOLN
15.0000 mL | Freq: Two times a day (BID) | OROMUCOSAL | Status: DC
  Administered 2021-03-23 – 2021-03-29 (×10): 15 mL via OROMUCOSAL
  Filled 2021-03-23 (×7): qty 15

## 2021-03-23 NOTE — Progress Notes (Addendum)
NAME:  GINA COSTILLA, MRN:  [id number], DOB:  1932/08/30, LOS: 2 ADMISSION DATE:  03/21/2021, CONSULTATION DATE:  9/29 REFERRING MD:  Dr. Billy Fischer, CHIEF COMPLAINT:  Septic Shock   History of Present Illness:  87 yo male with hx of recurrent lower extremity cellulitis mostly recently outpt therapy with keflex presented to ER with weakness, fatigue, and confusion.  Found to have worsening cellulitis for both legs with septic shock and bradycardia.  Started on IV antibiotics and pressors, and admitted to ICU.  Pertinent  Medical History  CAD, GERD, HLD, HTN, BPH, Thrombophlebitis, PAD, Hypothyroidism  Significant Hospital Events: Including procedures, antibiotic start and stop dates in addition to other pertinent events   9/29 admitted to ICU for septic shock needing levo; started cefepime, vanc, flagyl 9/30 bradycardia with elevated troponin and low EF on Echo >> cardiology consulted 10/1 off pressors, still has low heart rate at times  Interim History / Subjective:  Denies chest pain, dyspnea, or abdominal pain.  Leg discomfort better.  Still feels weak and tired.  Objective   Blood pressure (!) 125/58, pulse (!) 58, temperature (!) 96.5 F (35.8 C), temperature source Axillary, resp. rate 12, weight 92.9 kg, SpO2 95 %.        Intake/Output Summary (Last 24 hours) at 03/23/2021 0848 Last data filed at 03/23/2021 0600 Gross per 24 hour  Intake 600.23 ml  Output 2930 ml  Net -2329.77 ml   Filed Weights   03/22/21 0500 03/23/21 0500  Weight: 93.9 kg 92.9 kg    Examination:  General - sleepy Eyes - pupils reactive ENT - no sinus tenderness, no stridor Cardiac - bradycardic Chest - equal breath sounds b/l, no wheezing or rales Abdomen - soft, non tender, + bowel sounds Extremities - 2+ lower extremity edema Skin - bruise over lower quadrants b/l, erythema of both lower legs Neuro - wakes up easily, follows commands, moves all extremities  Resolved Hospital Problem list      Assessment & Plan:   Septic shock from recurrent lower extremity cellulitis and Pseudomonas UTI both present on admission. - day 3 of ABx - f/u Blood culture from 9/29  Hypothermia. - warming blanket  Bradycardia. Elevated troponin from demand ischemia. Acute on chronic systolic CHF. Hx of HLD, HTN, PAD. - continue to monitor in ICU - cardiology consulted - could add dopamine if bradycardia contributes to hemodynamic instability - continue ASA, zocor  Acute metabolic encephalopathy from sepsis. Hx of Parkinson's disease. - continue sinemet  Acute metabolic encephalopathy Hx of parkinsons P: -Hold sedating medication -frequent neuro checks -continue home sinemet  AKI from ATN 2nd to sepsis. Acute urine retention with hx of BPH. - f/u BMET - monitor urine outpt - continue foley for now  Anemia of critical illness. - f/u CBC - transfuse for Hb < 7   Hx of GERD - continue protonix  Hx of Hypothyroidism. - continue synthroid  Dysphagia. - speech to assess swallowing before advancing diet  Deconditioning. - PT/OT  Bruising over lower abdomen. - likely from SQ heparin injections - if stable then resume SQ heparin on 10/2  Best Practice (right click and "Reselect all SmartList Selections" daily)   Diet/type: NPO w/ oral meds DVT prophylaxis: SQ heparin on hold  GI prophylaxis: PPI Lines: N/A Foley:  Yes, and it is still needed Code Status:  full code Last date of multidisciplinary goals of care discussion [9/29: spoke with daughter and patient at bedside]  Labs:   [place] Ref  Rng & Units 03/23/2021 03/22/2021 03/21/2021  Glucose 70 - 99 mg/dL 78 86 -  BUN 8 - 23 mg/dL 35(H) 37(H) -  Creatinine 0.61 - 1.24 mg/dL 1.54(H) 1.60(H) -  Sodium 135 - 145 mmol/L 134(L) 133(L) 134(L)  Potassium 3.5 - 5.1 mmol/L 4.4 4.7 4.4  Chloride 98 - 111 mmol/L 104 100 -  CO2 22 - 32 mmol/L 22 23 -  Calcium 8.9 - 10.3 mg/dL 8.4(L) 8.5(L) -  Total Protein 6.5 - 8.1  g/dL 5.3(L) 5.8(L) -  Total Bilirubin 0.3 - 1.2 mg/dL 0.7 1.1 -  Alkaline Phos 38 - 126 U/L 61 62 -  AST 15 - 41 U/L 51(H) 33 -  ALT 0 - 44 U/L <5 <5 -    CBC Latest Ref Rng & Units 03/23/2021 03/22/2021 03/21/2021  WBC 4.0 - 10.5 K/uL 29.3(H) 43.2(H) 38.9(H)  Hemoglobin 13.0 - 17.0 g/dL 9.2(L) 9.9(L) 9.4(L)  Hematocrit 39.0 - 52.0 % 27.0(L) 28.4(L) 28.1(L)  Platelets 150 - 400 K/uL 128(L) 150 155   CBG (last 3)  Recent Labs    03/22/21 1136 03/22/21 1519 03/23/21 0234  GLUCAP 77 82 72   Critical care time: 37 minutes  Chesley Mires, MD [place] Pager - [phone] 03/23/2021, 9:02 AM

## 2021-03-23 NOTE — Evaluation (Signed)
Clinical/Bedside Swallow Evaluation Patient Details  Name: Zachary Decker MRN: [id number] Date of Birth: January 28, 1933  Today's Date: 03/23/2021 Time: SLP Start Time (ACUTE ONLY): 0174 SLP Stop Time (ACUTE ONLY): 1200 SLP Time Calculation (min) (ACUTE ONLY): 15 min  Past Medical History:  Past Medical History:  Diagnosis Date   Acute myocardial infarction of inferoposterior wall, subsequent episode of care ([place])    CAD (coronary artery disease)    Encounter for long-term (current) use of other medications    Family history of malignant neoplasm of gastrointestinal tract    GERD (gastroesophageal reflux disease)    Hypercholesterolemia    Hypertension    Hypertrophy of prostate with urinary obstruction and other lower urinary tract symptoms (LUTS)    Personal history of thrombophlebitis    PVD (peripheral vascular disease) ([facility])    Respiratory failure ([place])    Unspecified adverse effect of unspecified drug, medicinal and biological substance    Unspecified hypothyroidism    Past Surgical History:  Past Surgical History:  Procedure Laterality Date   BIOPSY  04/28/2019   Procedure: BIOPSY;  Surgeon: Thornton Park, MD;  Location: [place];  Service: Gastroenterology;;   CATARACT EXTRACTION, BILATERAL     CHOLECYSTECTOMY  02/26/2012   Procedure: LAPAROSCOPIC CHOLECYSTECTOMY WITH INTRAOPERATIVE CHOLANGIOGRAM;  Surgeon: Madilyn Hook, DO;  Location: [place];  Service: General;  Laterality: N/A;   CORONARY ARTERY BYPASS GRAFT     x 5 on August 29, 2008   ESOPHAGOGASTRODUODENOSCOPY (EGD) WITH PROPOFOL N/A 04/28/2019   Procedure: ESOPHAGOGASTRODUODENOSCOPY (EGD) WITH PROPOFOL;  Surgeon: Thornton Park, MD;  Location[facility] ENDOSCOPY;  Service: Gastroenterology;  Laterality: N/A;   REPLACEMENT TOTAL KNEE Left    tachecostomy placement     after heart attack   TONSILLECTOMY     HPI:  Patient is an 87 YO M with pertinent past medical history of HTN, HLD, hypothyroidism, PVD, and MSSA  bacteremia in 2020, recurrent BLE cellulitis being treated treated currently with Keflex presents to [facility] on 9/29 with generalized weakness, fatigue, and hypotension. He was diagnosed with septic shock.  Most recent chest xray was showing chronic scarring at the right lung base, and cardiomegaly with subsegmental atelectasis.  He is currently NPO excepts sips for meds.    Assessment / Plan / Recommendation  Clinical Impression  Clinical swallowing evaluation was completed using thin liquids via spoon and straw, and pureed material.  RN reported that he was doing well taking medication with sips of liquids until this AM when he'd become more lethargic.  The patient reported that he has some issues swallowing given foods and that he's received speech therapy in the past but was unable to elaborate on exactly when or why.  Cranial nerve exam was completed and unremarkble. Lingual, labial, facial and jaw range of motion and strength appeared to be adequate.  Facial sensation appeared to be intact and he did not endorse a difference in sensation from the right to left side of his face. His biggest risk factor for aspiration is his current alertness level. He required constant stimlation to remain awake for PO trials.  Strong cough was seen following thin liquids via small straw sips.  No obvious issues were seen given pureed material.  Suggest he remain NPO except medications crushed in pureed material as long as he can be roused sufficiently.  ST will follow up to re assess swallow as alertness improves. SLP Visit Diagnosis: Dysphagia, unspecified (R13.10)    Aspiration Risk  Severe aspiration risk  Diet Recommendation   NPO  Medication Administration: Crushed with puree    Other  Recommendations Oral Care Recommendations: Oral care QID    Recommendations for follow up therapy are one component of a multi-disciplinary discharge planning process, led by the attending physician.  Recommendations may be  updated based on patient status, additional functional criteria and insurance authorization.  Follow up Recommendations Other (comment) (TBD)      Frequency and Duration min 2x/week  2 weeks       Prognosis Prognosis for Safe Diet Advancement: Good      Swallow Study   General Date of Onset: 03/21/21 HPI: Patient is an 87 YO M with pertinent past medical history of HTN, HLD, hypothyroidism, PVD, and MSSA bacteremia in 2020, recurrent BLE cellulitis being treated treated currently with Keflex presents to [facility] on 9/29 with generalized weakness, fatigue, and hypotension. He was diagnosed with septic shock.  Most recent chest xray was showing chronic scarring at the right lung base, and cardiomegaly with subsegmental atelectasis.  He is currently NPO excepts sips for meds. Type of Study: Bedside Swallow Evaluation Previous Swallow Assessment: None noted at [facility]. Diet Prior to this Study: NPO Temperature Spikes Noted: No Respiratory Status: Room air History of Recent Intubation: No Behavior/Cognition: Cooperative;Lethargic/Drowsy Oral Cavity Assessment: Within Functional Limits Oral Care Completed by SLP: No Self-Feeding Abilities: Total assist Patient Positioning: Partially reclined Baseline Vocal Quality: Low vocal intensity Volitional Swallow: Able to elicit    Oral/Motor/Sensory Function Overall Oral Motor/Sensory Function: Within functional limits   Ice Chips Ice chips: Not tested   Thin Liquid Thin Liquid: Impaired Presentation: Spoon;Straw Pharyngeal  Phase Impairments: Cough - Immediate    Nectar Thick Nectar Thick Liquid: Not tested   Honey Thick Honey Thick Liquid: Not tested   Puree Puree: Within functional limits Presentation: Spoon   Solid     Solid: Not tested     Zachary Flatten, MA, [facility] SLP [phone]  Zachary Decker 03/23/2021,1:04 PM

## 2021-03-23 NOTE — Progress Notes (Signed)
[facility] ADULT ICU REPLACEMENT PROTOCOL   The patient does apply for the [facility] Adult ICU Electrolyte Replacment Protocol based on the criteria listed below:   1.Exclusion criteria: TCTS patients, ECMO patients and Hypothermia Protocol, and   Dialysis patients 2. Is GFR >/= 30 ml/min? Yes.    Patient's GFR today is 44 3. Is SCr </= 2? Yes.   Patient's SCr is 44 mg/dL 4. Did SCr increase >/= 0.5 in 24 hours? No. 5.Pt's weight >40kg  Yes.   6. Abnormal electrolyte(s):  Mg 1.8  7. Electrolytes replaced per protocol 8.  Call MD STAT for K+ </= 2.5, Phos </= 1, or Mag </= 1 Physician:  S. Rosie Fate R Merrell Rettinger 03/23/2021 6:07 AM

## 2021-03-23 NOTE — Progress Notes (Signed)
   03/23/21 1808  Clinical Encounter Type  Visited With Patient and family together  Visit Type Initial;Spiritual support  Referral From Nurse  Consult/Referral To [place] responded to page for family support. Pt's daughter, Olivia Mackie, was at bedside. Chaplain prayed with Pt and Pt's daughter. No further spiritual care needs at the moment. Chaplain remains available.  This note was prepared by Chaplain Resident, Dante Gang, MDiv. Chaplain remains available as needed through the on-call pager: [phone].

## 2021-03-23 NOTE — Progress Notes (Signed)
   Progress Note  Patient Name: Zachary Decker Date of Encounter: 03/23/2021  Primary Cardiologist: Lauree Chandler, MD  I reviewed the cardiology consultation note from yesterday.  Also interval hospital course.  Patient has been weaned off pressors at this time with relatively stable blood pressure, still bradycardic with heart rate in the 40s to 50s by telemetry, no evidence of heart block or significant pauses however.  I personally reviewed his tracing from 04/20/2021 which shows a sinus bradycardia with prolonged PR interval and IVCD with QRS duration 122 ms.  He has not had any definitive atrial fibrillation on review of telemetry.  He is afebrile, heart rate presently in the 50s and sinus bradycardia, still blood pressure 100-120.  Cardiac exam reveals RRR.  Follow-up echocardiogram reveals LVEF 40 to 45% with wall motion abnormalities consistent with ischemic cardiomyopathy, also moderate RV dysfunction.  He is on aspirin and statin with history of ischemic heart disease, no AV nodal blockers.  Would continue supportive measures, we are following in the background.  Signed, Rozann Lesches, MD  03/23/2021, 9:55 AM

## 2021-03-24 DIAGNOSIS — R6521 Severe sepsis with septic shock: Secondary | ICD-10-CM | POA: Diagnosis not present

## 2021-03-24 DIAGNOSIS — I480 Paroxysmal atrial fibrillation: Secondary | ICD-10-CM

## 2021-03-24 DIAGNOSIS — A419 Sepsis, unspecified organism: Secondary | ICD-10-CM | POA: Diagnosis not present

## 2021-03-24 DIAGNOSIS — R001 Bradycardia, unspecified: Secondary | ICD-10-CM | POA: Diagnosis not present

## 2021-03-24 LAB — CBC
HCT: 28.1 % — ABNORMAL LOW (ref 39.0–52.0)
Hemoglobin: 9.7 g/dL — ABNORMAL LOW (ref 13.0–17.0)
MCH: 36.5 pg — ABNORMAL HIGH (ref 26.0–34.0)
MCHC: 34.5 g/dL (ref 30.0–36.0)
MCV: 105.6 fL — ABNORMAL HIGH (ref 80.0–100.0)
Platelets: 111 10*3/uL — ABNORMAL LOW (ref 150–400)
RBC: 2.66 MIL/uL — ABNORMAL LOW (ref 4.22–5.81)
RDW: 13.9 % (ref 11.5–15.5)
WBC: 20.8 10*3/uL — ABNORMAL HIGH (ref 4.0–10.5)
nRBC: 0 % (ref 0.0–0.2)

## 2021-03-24 LAB — BASIC METABOLIC PANEL
Anion gap: 10 (ref 5–15)
BUN: 36 mg/dL — ABNORMAL HIGH (ref 8–23)
CO2: 21 mmol/L — ABNORMAL LOW (ref 22–32)
Calcium: 8.6 mg/dL — ABNORMAL LOW (ref 8.9–10.3)
Chloride: 105 mmol/L (ref 98–111)
Creatinine, Ser: 1.5 mg/dL — ABNORMAL HIGH (ref 0.61–1.24)
GFR, Estimated: 45 mL/min — ABNORMAL LOW (ref >60)
Glucose, Bld: 61 mg/dL — ABNORMAL LOW (ref 70–99)
Potassium: 4.3 mmol/L (ref 3.5–5.1)
Sodium: 136 mmol/L (ref 135–145)

## 2021-03-24 LAB — GLUCOSE, CAPILLARY
Glucose-Capillary: 63 mg/dL — ABNORMAL LOW (ref 70–99)
Glucose-Capillary: 64 mg/dL — ABNORMAL LOW (ref 70–99)
Glucose-Capillary: 65 mg/dL — ABNORMAL LOW (ref 70–99)
Glucose-Capillary: 70 mg/dL (ref 70–99)
Glucose-Capillary: 89 mg/dL (ref 70–99)

## 2021-03-24 LAB — MAGNESIUM: Magnesium: 2.4 mg/dL (ref 1.7–2.4)

## 2021-03-24 MED ORDER — GLYCOPYRROLATE 0.2 MG/ML IJ SOLN: 0.2000 mg | INTRAMUSCULAR | Status: DC | PRN

## 2021-03-24 MED ORDER — SODIUM CHLORIDE 0.9% FLUSH
3.0000 mL | Freq: Two times a day (BID) | INTRAVENOUS | Status: DC
  Administered 2021-03-24 – 2021-03-29 (×9): 3 mL via INTRAVENOUS

## 2021-03-24 MED ORDER — DEXTROSE 50 % IV SOLN
INTRAVENOUS | Status: AC
  Administered 2021-03-24: 25 mL via INTRAVENOUS
  Filled 2021-03-24: qty 50

## 2021-03-24 MED ORDER — ONDANSETRON 4 MG PO TBDP: 4.0000 mg | ORAL_TABLET | Freq: Four times a day (QID) | ORAL | Status: DC | PRN

## 2021-03-24 MED ORDER — LORAZEPAM 1 MG PO TABS: 1.0000 mg | ORAL_TABLET | ORAL | Status: DC | PRN

## 2021-03-24 MED ORDER — ACETAMINOPHEN 325 MG PO TABS: 650.0000 mg | ORAL_TABLET | Freq: Four times a day (QID) | ORAL | Status: DC | PRN

## 2021-03-24 MED ORDER — DEXTROSE 50 % IV SOLN
INTRAVENOUS | Status: AC
  Administered 2021-03-24: 50 mL
  Filled 2021-03-24: qty 50

## 2021-03-24 MED ORDER — FENTANYL CITRATE (PF) 100 MCG/2ML IJ SOLN
25.0000 ug | INTRAMUSCULAR | Status: DC | PRN
  Administered 2021-03-24 – 2021-03-25 (×3): 25 ug via INTRAVENOUS
  Filled 2021-03-24 (×3): qty 2

## 2021-03-24 MED ORDER — DIPHENHYDRAMINE HCL 50 MG/ML IJ SOLN: 12.5000 mg | INTRAMUSCULAR | Status: DC | PRN

## 2021-03-24 MED ORDER — ACETAMINOPHEN 650 MG RE SUPP: 650.0000 mg | Freq: Four times a day (QID) | RECTAL | Status: DC | PRN

## 2021-03-24 MED ORDER — GLYCOPYRROLATE 1 MG PO TABS: 1.0000 mg | ORAL_TABLET | ORAL | Status: DC | PRN

## 2021-03-24 MED ORDER — DEXTROSE 50 % IV SOLN
INTRAVENOUS | Status: AC
  Administered 2021-03-24: 25 mL
  Filled 2021-03-24: qty 50

## 2021-03-24 MED ORDER — SODIUM CHLORIDE 0.9% FLUSH: 3.0000 mL | INTRAVENOUS | Status: DC | PRN

## 2021-03-24 MED ORDER — ONDANSETRON HCL 4 MG/2ML IJ SOLN: 4.0000 mg | Freq: Four times a day (QID) | INTRAMUSCULAR | Status: DC | PRN

## 2021-03-24 MED ORDER — LORAZEPAM 2 MG/ML PO CONC
1.0000 mg | ORAL | Status: DC | PRN
Start: 2021-03-24 — End: 2021-03-25

## 2021-03-24 MED ORDER — LORAZEPAM 2 MG/ML IJ SOLN
1.0000 mg | INTRAMUSCULAR | Status: DC | PRN
  Administered 2021-03-25 – 2021-03-29 (×3): 1 mg via INTRAVENOUS
  Filled 2021-03-24 (×3): qty 1

## 2021-03-24 MED ORDER — SODIUM CHLORIDE 0.9 % IV SOLN: 250.0000 mL | INTRAVENOUS | Status: DC | PRN

## 2021-03-24 MED ORDER — DEXTROSE 50 % IV SOLN: 25.0000 mL | Freq: Once | INTRAVENOUS | Status: AC

## 2021-03-24 MED ORDER — DEXTROSE 50 % IV SOLN
INTRAVENOUS | Status: AC
  Filled 2021-03-24: qty 50

## 2021-03-24 MED ORDER — POLYVINYL ALCOHOL 1.4 % OP SOLN: 1.0000 [drp] | Freq: Four times a day (QID) | OPHTHALMIC | Status: DC | PRN

## 2021-03-24 NOTE — Progress Notes (Signed)
Hypoglycemic Event  CBG: 64  Treatment: D50 25 mL (12.5 gm)  Symptoms: None  Follow-up CBG: Time: 0514 CBG Result:89  Possible Reasons for Event: Other: NPO     Zachary Decker E Mariusz Jubb

## 2021-03-24 NOTE — Progress Notes (Signed)
NAME:  Zachary Decker, MRN:  [id number], DOB:  Nov 09, 1932, LOS: 3 ADMISSION DATE:  03/21/2021, CONSULTATION DATE:  9/29 REFERRING MD:  Dr. Billy Fischer, CHIEF COMPLAINT:  Septic Shock   History of Present Illness:  87 yo male with hx of recurrent lower extremity cellulitis mostly recently outpt therapy with keflex presented to ER with weakness, fatigue, and confusion.  Found to have worsening cellulitis for both legs with septic shock and bradycardia.  Started on IV antibiotics and pressors, and admitted to ICU.  Pertinent  Medical History  CAD, GERD, HLD, HTN, BPH, Thrombophlebitis, PAD, Hypothyroidism  Significant Hospital Events: Including procedures, antibiotic start and stop dates in addition to other pertinent events   9/29 admitted to ICU for septic shock needing levo; started cefepime, vanc, flagyl 9/30 bradycardia with elevated troponin and low EF on Echo >> cardiology consulted 10/1 off pressors, still has low heart rate at times  Interim History / Subjective:  Informed staff last night that he didn't want to pursue aggressive care anymore.  Feels very weak and tired.  Objective   Blood pressure 122/70, pulse 64, temperature 98.4 F (36.9 C), temperature source Axillary, resp. rate 12, weight 93.1 kg, SpO2 95 %.        Intake/Output Summary (Last 24 hours) at 03/24/2021 0827 Last data filed at 03/24/2021 0600 Gross per 24 hour  Intake 279.76 ml  Output 1045 ml  Net -765.24 ml   Filed Weights   03/22/21 0500 03/23/21 0500 03/24/21 0459  Weight: 93.9 kg 92.9 kg 93.1 kg    Examination:  General - somnolent Eyes - pupils reactive ENT - no sinus tenderness, no stridor Cardiac - regular rate/rhythm, no murmur Chest - equal breath sounds b/l, no wheezing or rales Abdomen - soft, non tender, + bowel sounds Extremities - 2+ lower leg edema Skin - improved erythema of Lt leg, but persistent in Rt leg Neuro - wakes up briefly with stimulation, says few words, and then  falls back to sleep  Resolved Hospital Problem list     Assessment & Plan:   Septic shock from recurrent lower extremity cellulitis and Pseudomonas UTI both present on admission. - day 5 of ABx - f/u blood culture from 9/29  Hypothermia. - warming blanket as needed  Bradycardia. Elevated troponin from demand ischemia. Acute on chronic systolic CHF. Hx of HLD, HTN, PAD. - cardiology consulted; conservative management at this time - resume ASA, zocor if/when he is able to swallow pills  Acute metabolic encephalopathy from sepsis. Hx of Parkinson's disease. - resume sinemet if/when he is able to swallow pills  AKI from ATN 2nd to sepsis. Acute urine retention with hx of BPH. - monitor urine outpt - keep foley in for now  Anemia of critical illness. - f/u CBC intermittently - transfuse for Hb < 7   Hx of GERD - continue protonix  Hx of Hypothyroidism. - resume synthroid if/when he is able to swallow pills  Dysphagia. - speech therapy consulted - don't think mental status will allow for safe swallowing at this time - will need to discuss with family if they would want to consider tube feedings  Bruising over lower abdomen. - likely from SQ heparin injections - defer resuming SQ heparin pending family discussion regarding goals of care  Goals of care. - pt expressed to staff and family on 10/1 that he did not want to continue aggressive therapy - asked family to come to hospital on 10/2 to have further discussions about goals  of care  Best Practice (right click and "Reselect all SmartList Selections" daily)   Diet/type: NPO w/ oral meds DVT prophylaxis: SQ heparin on hold  GI prophylaxis: PPI Lines: N/A Foley:  Yes, and it is still needed Code Status:  full code Last date of multidisciplinary goals of care discussion [9/29: spoke with daughter and patient at bedside]  Labs:   CMP Latest Ref Rng & Units 03/24/2021 03/23/2021 03/22/2021  Glucose 70 - 99 mg/dL  61(L) 78 86  BUN 8 - 23 mg/dL 36(H) 35(H) 37(H)  Creatinine 0.61 - 1.24 mg/dL 1.50(H) 1.54(H) 1.60(H)  Sodium 135 - 145 mmol/L 136 134(L) 133(L)  Potassium 3.5 - 5.1 mmol/L 4.3 4.4 4.7  Chloride 98 - 111 mmol/L 105 104 100  CO2 22 - 32 mmol/L 21(L) 22 23  Calcium 8.9 - 10.3 mg/dL 8.6(L) 8.4(L) 8.5(L)  Total Protein 6.5 - 8.1 g/dL - 5.3(L) 5.8(L)  Total Bilirubin 0.3 - 1.2 mg/dL - 0.7 1.1  Alkaline Phos 38 - 126 U/L - 61 62  AST 15 - 41 U/L - 51(H) 33  ALT 0 - 44 U/L - <5 <5    CBC Latest Ref Rng & Units 03/24/2021 03/23/2021 03/22/2021  WBC 4.0 - 10.5 K/uL 20.8(H) 29.3(H) 43.2(H)  Hemoglobin 13.0 - 17.0 g/dL 9.7(L) 9.2(L) 9.9(L)  Hematocrit 39.0 - 52.0 % 28.1(L) 27.0(L) 28.4(L)  Platelets 150 - 400 K/uL 111(L) 128(L) 150   CBG (last 3)  Recent Labs    03/23/21 0234 03/24/21 0431 03/24/21 0513  GLUCAP 72 64* 89   Critical care time: 34 minutes  Chesley Mires, MD [place] Pager - [phone] 03/24/2021, 8:27 AM

## 2021-03-24 NOTE — Progress Notes (Signed)
   Progress Note  Patient Name: Zachary Decker Date of Encounter: 03/24/2021  Primary Cardiologist: Lauree Chandler, MD  Reviewed interval chart.  Telemetry shows no prolonged bradycardic episodes or significant pauses, looks to be in atrial fibrillation with controlled ventricular response this morning.  Patient somnolent.  Unable to take p.o.'s.  Speech consultation pending.  Currently afebrile, heart rate in the 70s, blood pressure normal.  Pertinent lab work includes potassium 4.3, BUN 36, creatinine 1.5, WBC 20.8, hemoglobin 9.7, platelets 111.  Follow-up echocardiogram reveals LVEF 40 to 45% with wall motion abnormalities consistent with ischemic cardiomyopathy, also moderate RV dysfunction.  Continue supportive measures.  Further discussion pending amongst CCM team and family regarding goals of care.  Signed, Rozann Lesches, MD  03/24/2021, 9:34 AM

## 2021-03-24 NOTE — Progress Notes (Signed)
Met with pt's family.  Pt informed them that he is tired and does not want to continue with medical therapies.  He has accepted that this is his time and he would like to pass in peace and comfort.  The family is understanding of this and have accepted that this is his wish and want to respect this.  DNR order placed.  Will discontinue therapies not involved with patient comfort.  Will ask palliative care to assess for hospice.  Will transfer to medical floor med.  Will ask [facility] to assume care from 03/25/21.  Chesley Mires, MD [place] Pager - [phone] 03/24/2021, 10:56 AM

## 2021-03-24 NOTE — Progress Notes (Signed)
   03/24/21 1433  Clinical Encounter Type  Visited With Patient and family together;Health care provider  Visit Type Initial;Spiritual support;Patient actively dying  Referral From Physician  Consult/Referral To Chaplain  Spiritual Encounters  Spiritual Needs Prayer;Emotional;Grief support  Stress Factors  Family Stress Factors Loss;Health changes   Chaplain responded to a consult. Patient decided this morning to stop receiving supportive measures and transition to comfort care. Chaplain met with patient, daughter, and granddaughter. Chaplain offered prayer and support.Chaplain introduced spiritual care services. Chaplain will seek to follow-up. Spiritual care services available as needed.   Jeri Lager, Chaplain

## 2021-03-24 NOTE — Progress Notes (Signed)
SLP Cancellation Note  Patient Details Name: Zachary Decker MRN: [id number] DOB: March 05, 1933   Cancelled treatment:       Reason Eval/Treat Not Completed: Other (comment) (Pt transitioned to comfort care per his request. SLP orders therefore cancelled by Dr. Halford Chessman.)  [place]. Hardin Negus, [place], [place] Office number [phone] Pager [phone]  Horton Marshall 03/24/2021, 11:03 AM

## 2021-03-25 DIAGNOSIS — R410 Disorientation, unspecified: Secondary | ICD-10-CM | POA: Diagnosis not present

## 2021-03-25 DIAGNOSIS — Z515 Encounter for palliative care: Secondary | ICD-10-CM

## 2021-03-25 DIAGNOSIS — R4182 Altered mental status, unspecified: Secondary | ICD-10-CM | POA: Diagnosis not present

## 2021-03-25 DIAGNOSIS — I959 Hypotension, unspecified: Secondary | ICD-10-CM | POA: Diagnosis not present

## 2021-03-25 DIAGNOSIS — A419 Sepsis, unspecified organism: Secondary | ICD-10-CM | POA: Diagnosis not present

## 2021-03-25 DIAGNOSIS — R001 Bradycardia, unspecified: Secondary | ICD-10-CM | POA: Diagnosis not present

## 2021-03-25 DIAGNOSIS — R6521 Severe sepsis with septic shock: Secondary | ICD-10-CM | POA: Diagnosis not present

## 2021-03-25 DIAGNOSIS — Z66 Do not resuscitate: Secondary | ICD-10-CM

## 2021-03-25 LAB — GLUCOSE, CAPILLARY
Glucose-Capillary: 108 mg/dL — ABNORMAL HIGH (ref 70–99)
Glucose-Capillary: 158 mg/dL — ABNORMAL HIGH (ref 70–99)

## 2021-03-25 MED ORDER — SCOPOLAMINE 1 MG/3DAYS TD PT72
1.0000 | MEDICATED_PATCH | TRANSDERMAL | Status: DC
  Administered 2021-03-25 – 2021-03-28 (×2): 1.5 mg via TRANSDERMAL
  Filled 2021-03-25 (×3): qty 1

## 2021-03-25 MED ORDER — MORPHINE SULFATE 10 MG/5ML PO SOLN
10.0000 mg | ORAL | Status: DC | PRN
  Administered 2021-03-25 – 2021-03-29 (×3): 10 mg via ORAL
  Filled 2021-03-25: qty 5
  Filled 2021-03-25 (×4): qty 6

## 2021-03-25 MED ORDER — FENTANYL CITRATE (PF) 100 MCG/2ML IJ SOLN: 25.0000 ug | INTRAMUSCULAR | Status: DC | PRN

## 2021-03-25 MED ORDER — GLYCOPYRROLATE 0.2 MG/ML IJ SOLN: 0.3000 mg | INTRAMUSCULAR | Status: DC | PRN

## 2021-03-25 MED ORDER — MORPHINE SULFATE 10 MG/5ML PO SOLN
5.0000 mg | ORAL | Status: DC | PRN
  Administered 2021-03-25: 5 mg via ORAL
  Filled 2021-03-25: qty 4

## 2021-03-25 NOTE — TOC Progression Note (Addendum)
Transition of Care [facility]) - Progression Note    Patient Details  Name: Zachary Decker MRN: [id number] Date of Birth: 1933/01/25  Transition of [facility]) CM/SW Contact  Sharlet Salina Mila Homer, LCSW Phone Number: 03/25/2021, 10:52 AM  Clinical Narrative:  CSW received information that patient wants to discharge home and family wants hospice services. Daughter [name] was at the bedside and talked with her regarding hospice services. Ms. Verl Blalock indicated they do want to take patient home, and he and their mother live together and she and her brother help out. They are interested in hospice services for their dad and requested [facility]. Call made to intake person Venia Carbon [phone]) abd referral made.   Daughter called (10:57 am) regarding talking with [facility] and Ms. Wall informed CSW that she received a call from [facility] and the person is coming to see her today.    Expected Discharge Plan: [place]    Expected Discharge Plan and Services Expected Discharge Plan: [place] Determinants of Health (SDOH) Interventions    Readmission Risk Interventions Readmission Risk Prevention Plan 10/09/2018  Transportation Screening Complete  PCP or Specialist Appt within 5-7 Days Complete  Home Care Screening Complete  Medication Review (RN CM) Complete  Some recent data might be hidden

## 2021-03-25 NOTE — Progress Notes (Signed)
Zachary Decker  MGQ:[id number] DOB: 02/05/1933 DOA: 03/21/2021 PCP: [name], MD    Brief Narrative:  87 year old gentleman with history of hypertension, hyperlipidemia, hypothyroidism, peripheral vascular disease, recurrent bilateral lower extremity cellulitis and Parkinson's disease with chronic debility and wheelchair-bound status admitted to ICU on 9/29 where he presented with generalized weakness, fatigue and blood pressure of 50 systolic in the ER.  Upon arrival to the emergency room systolic blood pressure was 50 to 60s.  He was also taking Lasix and lisinopril at home.  WBC count 38.3.  Lactic acid 2.7.  Creatinine 1.84.  Given 2 L of fluid boluses without response, started on Levophed and admitted to the ICU. 10/2, remained on persistently poor clinical status and comfort care level of care is started.   Assessment & Plan:   Active Problems:   Septic shock (HCC)   Altered mental status   Leukocytosis   Bradycardia   AKI (acute kidney injury) ([place])  Septic shock from recurrent lower extremity cellulitis and Pseudomonas UTI present on admission Hypothermia and bradycardia Acute on chronic systolic congestive heart failure with known ischemic cardiomyopathy Acute metabolic encephalopathy from sepsis Parkinson's disease, profound physical debility and failure to thrive Acute urinary retention with history of BPH Dysphagia secondary to Parkinson's disease.  Severe back pain. End-of-life care.  Plan: With his severe comorbidities and desire not to be continue to be treated in ICU along with desire to peacefully die comfort care started on 10/2. All comfort care medications available.  Try concentrated morphine sublingual to assess whether this can be managed at home.  He cannot safely swallow any pills. No labs or escalation of care.  Discontinue telemetry. Keep Foley catheter, plan to discharge with Foley. RN can pronounce death if happens in the  hospital. Family trying to arrange adequate support at home to take him home as he would prefer to die at home. Transfer out of ICU, preferred palliative care floor. Unrestricted visitor policy.    DVT prophylaxis:   Comfort care   Code Status: Comfort care.  DNR papers signed. Family Communication: Daughter at the bedside Disposition Plan: Status is: Inpatient  Remains inpatient appropriate because:Unsafe d/c plan  Dispo: The patient is from: Home              Anticipated d/c is to:  Home with hospice              Patient currently is medically stable to d/c..  End-of-life care   Difficult to place patient No         Consultants:  Critical care Cardiology Palliative and hospice  Procedures:  None  Antimicrobials:  Multiple antibiotics.  Discontinued.   Subjective: Patient seen and examined.  Went to examine patient in the morning rounds.  His daughter was at the bedside.  Patient was sleepy but was able to keep up some conversation.  He tells me that he is ready to die, he wants to go home.  He wants to be peaceful and ready to go and meet Jesus.  He had bad back spasm that was relieved with some morphine.  He was not able to swallow any food.  Trying to take some ice chips.  After detailed discussion with patient's daughter, consulted [facility] hospice.  Objective: Vitals:   03/25/21 1000 03/25/21 1100 03/25/21 1200 03/25/21 1300  BP:      Pulse: (!) 54 (!) 58 (!) 53 73  Resp:  Temp:      TempSrc:      SpO2: 93% 91% 94% 92%  Weight:        Intake/Output Summary (Last 24 hours) at 03/25/2021 1326 Last data filed at 03/25/2021 0600 Gross per 24 hour  Intake --  Output 1000 ml  Net -1000 ml   Filed Weights   03/22/21 0500 03/23/21 0500 03/24/21 0459  Weight: 93.9 kg 92.9 kg 93.1 kg    Examination:  General: Frail and debilitated.  Sick looking.  Mostly sleepy.  Alert on stimulation and can keep up some conversation.  In moderate  distress. Tired.  Lethargic. Cardiovascular: S1-S2 normal.  Regular rate. Respiratory: Has some conducted upper airway sounds.  Otherwise looks comfortable.  He is on room air. Gastrointestinal: Soft.  Nontender. Ext: Bilateral lower extremity 2+ edema.  He does have some erythema on the anterior aspect of both legs.  Right more than left.      Data Reviewed: I have personally reviewed following labs and imaging studies  CBC: Recent Labs  Lab 03/21/21 1502 03/21/21 1541 03/21/21 1542 03/21/21 1659 03/22/21 0143 03/23/21 0225 03/24/21 0135  WBC 38.3*  --   --  38.9* 43.2* 29.3* 20.8*  NEUTROABS 36.8*  --   --   --   --   --   --   HGB 10.0*   < > 10.5* 9.4* 9.9* 9.2* 9.7*  HCT 29.6*   < > 31.0* 28.1* 28.4* 27.0* 28.1*  MCV 108.4*  --   --  109.3* 105.2* 106.7* 105.6*  PLT 158  --   --  155 150 128* 111*   < > = values in this interval not displayed.   Basic Metabolic Panel: Recent Labs  Lab 03/21/21 1502 03/21/21 1541 03/21/21 1542 03/22/21 0143 03/23/21 0225 03/24/21 0135  NA 133* 134* 134* 133* 134* 136  K 4.3 4.3 4.4 4.7 4.4 4.3  CL 99 97*  --  100 104 105  CO2 24  --   --  23 22 21*  GLUCOSE 93 93  --  86 78 61*  BUN 41* 49*  --  37* 35* 36*  CREATININE 1.84* 1.90*  --  1.60* 1.54* 1.50*  CALCIUM 8.6*  --   --  8.5* 8.4* 8.6*  MG 1.9  --   --  1.8 1.8 2.4  PHOS  --   --   --  2.8  --   --    GFR: Estimated Creatinine Clearance: 40.7 mL/min (A) (by C-G formula based on SCr of 1.5 mg/dL (H)). Liver Function Tests: Recent Labs  Lab 03/21/21 1502 03/22/21 0143 03/23/21 0225  AST 41 33 51*  ALT 7 <5 <5  ALKPHOS 60 62 61  BILITOT 0.6 1.1 0.7  PROT 5.8* 5.8* 5.3*  ALBUMIN 2.9* 2.8* 2.5*   No results for input(s): LIPASE, AMYLASE in the last 168 hours. No results for input(s): AMMONIA in the last 168 hours. Coagulation Profile: Recent Labs  Lab 03/21/21 1502  INR 1.1   Cardiac Enzymes: No results for input(s): CKTOTAL, CKMB, CKMBINDEX, TROPONINI  in the last 168 hours. BNP (last 3 results) No results for input(s): PROBNP in the last 8760 hours. HbA1C: No results for input(s): HGBA1C in the last 72 hours. CBG: Recent Labs  Lab 03/24/21 1103 03/24/21 2014 03/24/21 2333 03/25/21 0000 03/25/21 0044  GLUCAP 63* 70 65* 158* 108*   Lipid Profile: No results for input(s): CHOL, HDL, LDLCALC, TRIG, CHOLHDL, LDLDIRECT in the last 72  hours. Thyroid Function Tests: No results for input(s): TSH, T4TOTAL, FREET4, T3FREE, THYROIDAB in the last 72 hours. Anemia Panel: No results for input(s): VITAMINB12, FOLATE, FERRITIN, TIBC, IRON, RETICCTPCT in the last 72 hours. Sepsis Labs: Recent Labs  Lab 03/21/21 1502 03/21/21 1650 03/21/21 1659 03/21/21 2011 03/22/21 0143 03/23/21 0225  PROCALCITON  --   --  2.36  --  57.55 1.35  LATICACIDVEN 2.7* 3.0*  --  1.9  --   --     Recent Results (from the past 240 hour(s))  Resp Panel by RT-PCR (Flu A&B, Covid) Nasopharyngeal Swab     Status: None   Collection Time: 03/21/21  3:02 PM   Specimen: Nasopharyngeal Swab; Nasopharyngeal(NP) swabs in vial transport medium  Result Value Ref Range Status   SARS Coronavirus 2 by RT PCR NEGATIVE NEGATIVE Final    Comment: (NOTE) SARS-CoV-2 target nucleic acids are NOT DETECTED.  The SARS-CoV-2 RNA is generally detectable in upper respiratory specimens during the acute phase of infection. The lowest concentration of SARS-CoV-2 viral copies this assay can detect is 138 copies/mL. A negative result does not preclude SARS-Cov-2 infection and should not be used as the sole basis for treatment or other patient management decisions. A negative result may occur with  improper specimen collection/handling, submission of specimen other than nasopharyngeal swab, presence of viral mutation(s) within the areas targeted by this assay, and inadequate number of viral copies(<138 copies/mL). A negative result must be combined with clinical observations, patient  history, and epidemiological information. The expected result is Negative.  Fact Sheet for Patients:  EntrepreneurPulse.com.au  Fact Sheet for Healthcare Providers:  IncredibleEmployment.be  This test is no t yet approved or cleared by the [place] FDA and  has been authorized for detection and/or diagnosis of SARS-CoV-2 by FDA under an Emergency Use Authorization (EUA). This EUA will remain  in effect (meaning this test can be used) for the duration of the COVID-19 declaration under Section 564(b)(1) of the Act, 21 U.S.C.section 360bbb-3(b)(1), unless the authorization is terminated  or revoked sooner.       Influenza A by PCR NEGATIVE NEGATIVE Final   Influenza B by PCR NEGATIVE NEGATIVE Final    Comment: (NOTE) The Xpert Xpress SARS-CoV-2/FLU/RSV plus assay is intended as an aid in the diagnosis of influenza from Nasopharyngeal swab specimens and should not be used as a sole basis for treatment. Nasal washings and aspirates are unacceptable for Xpert Xpress SARS-CoV-2/FLU/RSV testing.  Fact Sheet for Patients: EntrepreneurPulse.com.au  Fact Sheet for Healthcare Providers: IncredibleEmployment.be  This test is not yet approved or cleared by the [place] FDA and has been authorized for detection and/or diagnosis of SARS-CoV-2 by FDA under an Emergency Use Authorization (EUA). This EUA will remain in effect (meaning this test can be used) for the duration of the COVID-19 declaration under Section 564(b)(1) of the Act, 21 U.S.C. section 360bbb-3(b)(1), unless the authorization is terminated or revoked.  Performed at [facility], [place] [address], [place], [place] [address]   Blood Culture (routine x 2)     Status: None (Preliminary result)   Collection Time: 03/21/21  3:02 PM   Specimen: BLOOD  Result Value Ref Range Status   Specimen Description BLOOD SITE NOT SPECIFIED  Final    Special Requests   Final    BOTTLES DRAWN AEROBIC AND ANAEROBIC Blood Culture adequate volume   Culture   Final    NO GROWTH 4 DAYS Performed at [facility], [place]  [address], [place], [place] [address]    Report Status PENDING  Incomplete  Blood Culture (routine x 2)     Status: None (Preliminary result)   Collection Time: 03/21/21  3:15 PM   Specimen: BLOOD  Result Value Ref Range Status   Specimen Description BLOOD SITE NOT SPECIFIED  Final   Special Requests   Final    BOTTLES DRAWN AEROBIC AND ANAEROBIC Blood Culture adequate volume   Culture   Final    NO GROWTH 4 DAYS Performed at [facility], [address], [place], [place] [address]    Report Status PENDING  Incomplete  Urine Culture     Status: Abnormal   Collection Time: 03/21/21  6:10 PM   Specimen: In/Out Cath Urine  Result Value Ref Range Status   Specimen Description IN/OUT CATH URINE  Final   Special Requests   Final    NONE Performed at [facility], [place] [address], [place], [place] [address]    Culture 40,000 COLONIES/mL PSEUDOMONAS AERUGINOSA (A)  Final   Report Status 03/23/2021 FINAL  Final   Organism ID, Bacteria PSEUDOMONAS AERUGINOSA (A)  Final      Susceptibility   Pseudomonas aeruginosa - MIC*    CEFTAZIDIME 2 SENSITIVE Sensitive     CIPROFLOXACIN <=0.25 SENSITIVE Sensitive     GENTAMICIN <=1 SENSITIVE Sensitive     IMIPENEM 2 SENSITIVE Sensitive     PIP/TAZO <=4 SENSITIVE Sensitive     CEFEPIME 2 SENSITIVE Sensitive     * 40,000 COLONIES/mL PSEUDOMONAS AERUGINOSA  MRSA Next Gen by PCR, Nasal     Status: None   Collection Time: 03/21/21  7:46 PM   Specimen: Nasal Mucosa; Nasal Swab  Result Value Ref Range Status   MRSA by PCR Next Gen NOT DETECTED NOT DETECTED Final    Comment: (NOTE) The GeneXpert MRSA Assay (FDA approved for NASAL specimens only), is one component of a comprehensive MRSA colonization surveillance program. It is not intended to diagnose MRSA infection nor  to guide or monitor treatment for MRSA infections. Test performance is not FDA approved in patients less than 64 years old. Performed at [facility], [place] [address], [place], [place] [address]          Radiology Studies: No results found.      Scheduled Meds:  chlorhexidine  15 mL Mouth Rinse BID   Chlorhexidine Gluconate Cloth  6 each Topical Daily   sodium chloride flush  3 mL Intravenous Q12H   Continuous Infusions:  sodium chloride Stopped (03/24/21 0133)   sodium chloride       LOS: 4 days    Time spent: 40 minutes    Barb Merino, MD [facility] Pager [phone]

## 2021-03-25 NOTE — Progress Notes (Signed)
Manufacturing engineer [facility]  Received request to meet with patient and daughter to discuss hospice.  Provided support, answered questions and educated on hospice philosophy.   Mr. Moure wants to return home to pass. Family is concerned that they can provide the care he needs but are willing to do this to honor his wishes.   I advised that the window for discharging him home is narrow and the sooner we can get him home the more time he will have cognizant of his surroundings. The family thinks the earliest they can prepare for his discharge is Wednesday.  DME discussed, he will need a hospital bed. Olivia Mackie asked that we not order today though.  He has a foley in place, please leave this in place for his EOL care.   [facility] will follow up with Olivia Mackie tomorrow and order necessary DME in anticipation of discharging on Wednesday.  When discharging, please arrange for comfort meds so there is not lapse in managing his symptoms prior to hospice arriving in the home.   Please send completed DNR.  Thank you, Venia Carbon RN, BSN, [facility] Liaison

## 2021-03-25 NOTE — Progress Notes (Signed)
   03/25/21 0915  Clinical Encounter Type  Visited With Patient and family together  Visit Type Follow-up;Social support;Spiritual support;Psychological support;Critical Care  Referral From [facility] visited pt. as follow-up from [place] visit yesterday; pt. sitting up in bed w/dtr. at bedside; pt. shared "[place]... I'm ready to see Jesus," and seemed weary but undistressed.  Dtr. expressed gratitude for visit and requested chaplains "keep checking on [place]."  Plan is to transfer pt. to another unit where more visitors will be allowed to come see him.  Chaplains will continue to monitor and provide support as needed.  Lindaann Pascal, Chaplain Pager: [phone]

## 2021-03-25 NOTE — Progress Notes (Signed)
Received 8 mg oral solution Morphine. Gave 5 mg (3.5 ml) and wasted 1.5 ml with Loni Muse, RN.

## 2021-03-25 NOTE — Consult Note (Signed)
Palliative Care Consult Note                                  Date: 03/25/2021   Patient Name: Zachary Decker  DOB: Mar 05, 1933  MRN: [id number]  Age / Sex: 87 y.o., male  PCP: [name], MD Referring Physician: Barb Merino, MD  Reason for Consultation: Establishing goals of care  HPI/Patient Profile: Palliative Care consult requested for goals of care discussion in this 87 y.o. male  with past medical history of hypertension, hyperlipidemia, PVD, hypothyroidism, MI, Parkinsons, CAD, MSSA bacteremia, and GERD. He was admitted on 03/21/2021 from home with generalized weakness, fatigue, and hypotension. Patient received 0.11m of epi prior to arrival via EMS. Since admission no improvement in hypotension despite levophed and multiple boluses. Family and patient requesting comfort and consideration for hospice given no clinical improvement.   Past Medical History:  Diagnosis Date   Acute myocardial infarction of inferoposterior wall, subsequent episode of care ([facility]    CAD (coronary artery disease)    Encounter for long-term (current) use of other medications    Family history of malignant neoplasm of gastrointestinal tract    GERD (gastroesophageal reflux disease)    Hypercholesterolemia    Hypertension    Hypertrophy of prostate with urinary obstruction and other lower urinary tract symptoms (LUTS)    Personal history of thrombophlebitis    PVD (peripheral vascular disease) (HCC)    Respiratory failure (HCC)    Unspecified adverse effect of unspecified drug, medicinal and biological substance    Unspecified hypothyroidism      Subjective:   This NP NOsborne Omanreviewed medical records, received report from team, assessed the patient and then met at the patient's bedside with patient, his wife (Apolonio Schneiders and daughter (Olivia Mackie to discuss diagnosis, prognosis, GCarefree EOL wishes disposition and options.  Mr. PAyoubresting in bed.  Somnolent but easily aroused. Answers orientation questions correctly. Denies pain. States he is weak and knows he is rapidly approaching end-of-life.    Concept of Palliative Care was introduced as specialized medical care for people and their families living with serious illness.  It focuses on providing relief from the symptoms and stress of a serious illness.  The goal is to improve quality of life for both the patient and the family. Values and goals of care important to patient and family were attempted to be elicited.  I created space and opportunity for patient and family to explore state of health prior to admission, thoughts, and feelings. Prior to admission patient's health had been declining per family. He has been chair bound for the past month with poor appetite.   We discussed His current illness and what it means in the larger context of His on-going co-morbidities. Natural disease trajectory and expectations were discussed.  Patient and family verbalized understanding of current illness and co-morbidities. Family and patient clear in expressed wishes to focus on comfort given patient's poor quality of life and no meaningful recovery.   Quality means more to family than quantity and they do not wish for patient to be in suffering state or to continue require multiple hospitalizations with no meaningful recovery.   Questions and concerns were addressed.  Hard Choices booklet left for review. The family was encouraged to call with questions or concerns.  PMT will continue to support holistically as needed.  Life Review: Patient lives in the home with his wife of  more than 60 years. They have 2 children. He is a graduate of [employer]. A retired history teacher and football coach.    Objective:   Primary Diagnoses: Present on Admission: **None**   Scheduled Meds:  chlorhexidine  15 mL Mouth Rinse BID   Chlorhexidine Gluconate Cloth  6 each Topical Daily   sodium chloride flush  3 mL  Intravenous Q12H    Continuous Infusions:  sodium chloride Stopped (03/24/21 0133)   sodium chloride      PRN Meds: sodium chloride, acetaminophen **OR** acetaminophen, diphenhydrAMINE, fentaNYL (SUBLIMAZE) injection, glycopyrrolate **OR** glycopyrrolate **OR** glycopyrrolate, LORazepam **OR** LORazepam **OR** LORazepam, morphine, ondansetron **OR** ondansetron (ZOFRAN) IV, polyvinyl alcohol, sodium chloride flush  Allergies  Allergen Reactions   Losartan Potassium Other (See Comments)    Not known (was told he had an allergy)   Naproxen Nausea And Vomiting   Penicillins Rash    Did it involve swelling of the face/tongue/throat, SOB, or low BP? No Did it involve sudden or severe rash/hives, skin peeling, or any reaction on the inside of your mouth or nose? Yes Did you need to seek medical attention at a hospital or doctor's office? Unknown When did it last happen?       If all above answers are "NO", may proceed with cephalosporin use.  TOLERATED CEFTRIAXONE 10/07/2018    Review of Systems  Unable to perform ROS: Acuity of condition  Unless otherwise noted, a complete review of systems is negative.  Physical Exam General: Lethargic, frail chronically-ill appearing Cardiovascular: hypotensive, bradycardic Pulmonary: Coarse Abdomen: soft, nontender, + bowel sounds Extremities: bilateral lower extremity edema, bilateral upper extremity erythema, no joint deformities Neurological: lethargic, will awaken an answers questions appropriately.   Vital Signs:  BP 121/67   Pulse 73   Temp 98.2 F (36.8 C) (Axillary)   Resp 13   Wt 93.1 kg   SpO2 92%   BMI 27.08 kg/m  Pain Scale: 0-10   Pain Score: 6   SpO2: SpO2: 92 % O2 Device:SpO2: 92 % O2 Flow Rate: .   IO: Intake/output summary:  Intake/Output Summary (Last 24 hours) at 03/25/2021 1456 Last data filed at 03/25/2021 1400 Gross per 24 hour  Intake --  Output 1250 ml  Net -1250 ml    LBM: Last BM Date:  03/23/21 Baseline Weight: Weight: 93.9 kg Most recent weight: Weight: 93.1 kg      Palliative Assessment/Data: PPS 10-20%   Advanced Care Planning:   Primary Decision Maker: NEXT OF KIN  Code Status/Advance Care Planning: DNR  Patient and family are clear in expressed goals to focus all care on comfort. Education and discussions regarding ongoing symptom management. Family verbalized understanding.   Family has been in discussion with Jennifer, RN ([facility]) regarding discharging home with hospice support and equipment needs. Family is planning for equipment and possible transfer home on Wednesday. Discussed at length as patient continues to decline he is at high risk of sudden death and possible hospital death prior to the ability to return home. Wife verbalized understanding and states they will be at peace with this as they do not want to get him home until things has been arranged and family is comfortable in their ability to care for him. They are not interested in residential hospice at this time.   Discussed continued assessment for ability to safely transfer home in the setting of stable symptoms and no evidence of nearing end-of-life risking death during transport. Family appreciative of support and care being received.      Assessment & Plan:   SUMMARY OF RECOMMENDATIONS   DNR/DNI/Full Code-as confirmed by family Continue with comfort focused care. Discontinue all interventions not comfort focused.  Robinul PRN for secretions Increase Fentanyl PRN for pain/dyspnea Ativan PRN for anxiety/agitation Scopolamine for secretions Zofran PRN for nausea  Family arranging home set-up and outpatient hospice services with Anderson Malta, RN [facility]). Anticipated discharge on Wednesday however aware patient may not be stable for transfer or pass away prior to ability to return home. Family understands and express they are at peace with either situation.  PMT will continue to support and follow  as needed. Please call team line with urgent needs.  Palliative Prophylaxis:  Frequent Pain Assessment, Oral Care, and Turn Reposition  Additional Recommendations (Limitations, Scope, Preferences): Full Comfort Care  Psycho-social/Spiritual:  Desire for further Chaplaincy support: no Additional Recommendations: Education on Hospice  Prognosis:  < 2 weeks (Days)  Discharge Planning:  Anticipated Hospital Death VS. Home with outpatient hospice support [facility]).   Discussed with: Janett Billow, RN  Family expressed understanding and was in agreement with this plan.   Time In: 1400 Time Out: 1510 Time Total: 70 min.   Visit consisted of counseling and education dealing with the complex and emotionally intense issues of symptom management and palliative care in the setting of serious and potentially life-threatening illness.Greater than 50%  of this time was spent counseling and coordinating care related to the above assessment and plan.  Signed by:  Alda Lea, AGPCNP-BC Palliative Medicine Team  Phone: [phone] Pager: [phone] Amion: Bjorn Pippin   Thank you for allowing the Palliative Medicine Team to assist in the care of this patient. Please utilize secure chat with additional questions, if there is no response within 30 minutes please call the above phone number. Palliative Medicine Team providers are available by phone from 7am to 5pm daily and can be reached through the team cell phone.  Should this patient require assistance outside of these hours, please call the patient's attending physician.

## 2021-03-25 NOTE — Progress Notes (Signed)
Hypoglycemic Event  CBG: 65  Treatment: D50 50 mL (25 gm)  Symptoms: None  Follow-up CBG: Time: 0000  CBG Result: 158  Possible Reasons for Event: Other: NPO  Dorothy, RN Made aware.    Antonietta Breach

## 2021-03-25 NOTE — Progress Notes (Signed)
Patient given to me by Janett Billow RN at approximately 1530. Patient reports no pain and is comfortably visiting with family with no complaints. Patient is comfort care so full assessment was not performed. At approximately 1800 patient moved to 6n29 by myself and NT. Patient family alerted no belongings at bedside.

## 2021-03-25 NOTE — Plan of Care (Signed)
Pt. Transitioning to comfort care.  Problem: Education: Goal: Knowledge of General Education information will improve Description: Including pain rating scale, medication(s)/side effects and non-pharmacologic comfort measures Outcome: Not Met (add Reason)   Problem: Health Behavior/Discharge Planning: Goal: Ability to manage health-related needs will improve Outcome: Not Met (add Reason)   Problem: Clinical Measurements: Goal: Ability to maintain clinical measurements within normal limits will improve Outcome: Not Met (add Reason) Goal: Will remain free from infection Outcome: Not Met (add Reason) Goal: Diagnostic test results will improve Outcome: Not Met (add Reason) Goal: Respiratory complications will improve Outcome: Not Met (add Reason) Goal: Cardiovascular complication will be avoided Outcome: Not Met (add Reason)   Problem: Activity: Goal: Risk for activity intolerance will decrease Outcome: Not Met (add Reason)   Problem: Nutrition: Goal: Adequate nutrition will be maintained Outcome: Not Met (add Reason)   Problem: Coping: Goal: Level of anxiety will decrease Outcome: Not Met (add Reason)   Problem: Elimination: Goal: Will not experience complications related to bowel motility Outcome: Not Met (add Reason) Goal: Will not experience complications related to urinary retention Outcome: Not Met (add Reason)   Problem: Pain Managment: Goal: General experience of comfort will improve Outcome: Not Met (add Reason)   Problem: Safety: Goal: Ability to remain free from injury will improve Outcome: Not Met (add Reason)   Problem: Skin Integrity: Goal: Risk for impaired skin integrity will decrease Outcome: Not Met (add Reason)   

## 2021-03-25 NOTE — Progress Notes (Signed)
Noted to be in NSR  Now comfort care  Will sign off  [place] will sign off.   Medication Recommendations:  as per [facility] Other recommendations (labs, testing, etc):  none Follow up as an outpatient:  none    Norrin Shreffler Martinique MD, [facility]

## 2021-03-25 NOTE — Progress Notes (Signed)
Nutrition Brief Note  Chart reviewed. Pt now transitioning to comfort care. No further nutrition interventions planned at this time. Please consult as needed.   Gustavus Bryant, MS, RD, LDN Inpatient Clinical Dietitian Please see AMiON for contact information.

## 2021-03-26 DIAGNOSIS — Z515 Encounter for palliative care: Secondary | ICD-10-CM | POA: Diagnosis not present

## 2021-03-26 DIAGNOSIS — R4182 Altered mental status, unspecified: Secondary | ICD-10-CM | POA: Diagnosis not present

## 2021-03-26 DIAGNOSIS — R001 Bradycardia, unspecified: Secondary | ICD-10-CM | POA: Diagnosis not present

## 2021-03-26 DIAGNOSIS — R410 Disorientation, unspecified: Secondary | ICD-10-CM | POA: Diagnosis not present

## 2021-03-26 DIAGNOSIS — N179 Acute kidney failure, unspecified: Secondary | ICD-10-CM | POA: Diagnosis not present

## 2021-03-26 LAB — CULTURE, BLOOD (ROUTINE X 2)
Culture: NO GROWTH
Culture: NO GROWTH
Special Requests: ADEQUATE
Special Requests: ADEQUATE

## 2021-03-26 NOTE — Progress Notes (Signed)
Manufacturing engineer [facility]) Hospice  Spoke with TOC Heather who advised pt seems to be rapidly declining.  Discussed possibility of evaluation for [facility] for inpatient hospice; at this time pt appears to staff to be unstable for transport/  Family wishes for pt to remain at [facility] for care at expected end of life.  Should [facility] support be needed please do not hesitate to reach out. Thank you for the opportunity to participate in this pt's care.  Domenic Moras, BSN, RN [facility] Liaison  [phone]

## 2021-03-26 NOTE — Progress Notes (Signed)
PROGRESS NOTE    Zachary Decker  FFM:[id number] DOB: 10/17/1932 DOA: 03/21/2021 PCP: [name], MD    Brief Narrative:  87 year old gentleman with history of hypertension, hyperlipidemia, hypothyroidism, peripheral vascular disease, recurrent bilateral lower extremity cellulitis and Parkinson's disease with chronic debility and wheelchair-bound status admitted to ICU on 9/29 where he presented with generalized weakness, fatigue and blood pressure of 50 systolic in the ER.  Upon arrival to the emergency room systolic blood pressure was 50 to 60s.  He was also taking Lasix and lisinopril at home.  WBC count 38.3.  Lactic acid 2.7.  Creatinine 1.84.  Given 2 L of fluid boluses without response, started on Levophed and admitted to the ICU. 10/2, remained on persistently poor clinical status and comfort care level of care started.   Assessment & Plan:   Active Problems:   Septic shock (HCC)   Altered mental status   Leukocytosis   Bradycardia   AKI (acute kidney injury) ([place])   End of life care  Septic shock from recurrent lower extremity cellulitis and Pseudomonas UTI present on admission Hypothermia and bradycardia Acute on chronic systolic congestive heart failure with known ischemic cardiomyopathy Acute metabolic encephalopathy from sepsis Parkinson's disease, profound physical debility and failure to thrive Acute urinary retention with history of BPH Dysphagia secondary to Parkinson's disease.  Severe back pain. End-of-life care.  Plan: With his severe comorbidities and desire not to be continue to be treated in ICU along with desire to peacefully die comfort care started on 10/2. All comfort care medications available. He cannot safely swallow any pills. No labs or escalation of care.   Keep Foley catheter, plan to discharge with Foley. RN can pronounce death if happens in the hospital. Family trying to arrange adequate support at home to take him home as he would  prefer to die at home, however, now he looks like he may die in the hospital. Unrestricted visitor policy. Followed by palliative care team and other acute hospice.    DVT prophylaxis:   Comfort care   Code Status: Comfort care.  DNR papers signed. Family Communication: None. Disposition Plan: Status is: Inpatient  Remains inpatient appropriate because:Unsafe d/c plan  Dispo: The patient is from: Home              Anticipated d/c is to:  Home with hospice              Patient currently is medically stable to d/c..  End-of-life care   Difficult to place patient No         Consultants:  Critical care Cardiology Palliative and hospice  Procedures:  None  Antimicrobials:  Multiple antibiotics.  Discontinued.   Subjective: Patient seen and examined.  He was sleepy but responded to me.  He denied any complaints.  He wanted some ice water which I requested his bedside nurse to give under supervision. "I am ready to have my funeral arrangements to be done"  Objective: Vitals:   03/25/21 1300 03/25/21 1849 03/26/21 0555 03/26/21 0816  BP:  122/64 (!) 114/46 95/78  Pulse: 73 65 (!) 55 66  Resp:  11 10 16   Temp:  98.3 F (36.8 C) 97.6 F (36.4 C) 97.7 F (36.5 C)  TempSrc:  Oral Oral Axillary  SpO2: 92% 93% 96% 95%  Weight:        Intake/Output Summary (Last 24 hours) at 03/26/2021 1355 Last data filed at 03/26/2021 0702 Gross per 24 hour  Intake --  Output 400 ml  Net -400 ml   Filed Weights   03/22/21 0500 03/23/21 0500 03/24/21 0459  Weight: 93.9 kg 92.9 kg 93.1 kg    Examination:  General: Frail and debilitated.  Sick looking. Tired.  Lethargic. Is alert on stimulation.  Wakes up and answer some basic questions. Cardiovascular: S1-S2 normal.  Regular rate. Respiratory: Mostly on room air. Gastrointestinal: Soft.  Nontender. Ext: Bilateral lower extremity 2+ edema.  He does have some erythema on the anterior aspect of both legs.  Right more than  left.      Data Reviewed: I have personally reviewed following labs and imaging studies  CBC: Recent Labs  Lab 03/21/21 1502 03/21/21 1541 03/21/21 1542 03/21/21 1659 03/22/21 0143 03/23/21 0225 03/24/21 0135  WBC 38.3*  --   --  38.9* 43.2* 29.3* 20.8*  NEUTROABS 36.8*  --   --   --   --   --   --   HGB 10.0*   < > 10.5* 9.4* 9.9* 9.2* 9.7*  HCT 29.6*   < > 31.0* 28.1* 28.4* 27.0* 28.1*  MCV 108.4*  --   --  109.3* 105.2* 106.7* 105.6*  PLT 158  --   --  155 150 128* 111*   < > = values in this interval not displayed.   Basic Metabolic Panel: Recent Labs  Lab 03/21/21 1502 03/21/21 1541 03/21/21 1542 03/22/21 0143 03/23/21 0225 03/24/21 0135  NA 133* 134* 134* 133* 134* 136  K 4.3 4.3 4.4 4.7 4.4 4.3  CL 99 97*  --  100 104 105  CO2 24  --   --  23 22 21*  GLUCOSE 93 93  --  86 78 61*  BUN 41* 49*  --  37* 35* 36*  CREATININE 1.84* 1.90*  --  1.60* 1.54* 1.50*  CALCIUM 8.6*  --   --  8.5* 8.4* 8.6*  MG 1.9  --   --  1.8 1.8 2.4  PHOS  --   --   --  2.8  --   --    GFR: Estimated Creatinine Clearance: 40.7 mL/min (A) (by C-G formula based on SCr of 1.5 mg/dL (H)). Liver Function Tests: Recent Labs  Lab 03/21/21 1502 03/22/21 0143 03/23/21 0225  AST 41 33 51*  ALT 7 <5 <5  ALKPHOS 60 62 61  BILITOT 0.6 1.1 0.7  PROT 5.8* 5.8* 5.3*  ALBUMIN 2.9* 2.8* 2.5*   No results for input(s): LIPASE, AMYLASE in the last 168 hours. No results for input(s): AMMONIA in the last 168 hours. Coagulation Profile: Recent Labs  Lab 03/21/21 1502  INR 1.1   Cardiac Enzymes: No results for input(s): CKTOTAL, CKMB, CKMBINDEX, TROPONINI in the last 168 hours. BNP (last 3 results) No results for input(s): PROBNP in the last 8760 hours. HbA1C: No results for input(s): HGBA1C in the last 72 hours. CBG: Recent Labs  Lab 03/24/21 1103 03/24/21 2014 03/24/21 2333 03/25/21 0000 03/25/21 0044  GLUCAP 63* 70 65* 158* 108*   Lipid Profile: No results for input(s):  CHOL, HDL, LDLCALC, TRIG, CHOLHDL, LDLDIRECT in the last 72 hours. Thyroid Function Tests: No results for input(s): TSH, T4TOTAL, FREET4, T3FREE, THYROIDAB in the last 72 hours. Anemia Panel: No results for input(s): VITAMINB12, FOLATE, FERRITIN, TIBC, IRON, RETICCTPCT in the last 72 hours. Sepsis Labs: Recent Labs  Lab 03/21/21 1502 03/21/21 1650 03/21/21 1659 03/21/21 2011 03/22/21 0143 03/23/21 0225  PROCALCITON  --   --  2.36  --  57.55 1.35  LATICACIDVEN 2.7* 3.0*  --  1.9  --   --     Recent Results (from the past 240 hour(s))  Resp Panel by RT-PCR (Flu A&B, Covid) Nasopharyngeal Swab     Status: None   Collection Time: 03/21/21  3:02 PM   Specimen: Nasopharyngeal Swab; Nasopharyngeal(NP) swabs in vial transport medium  Result Value Ref Range Status   SARS Coronavirus 2 by RT PCR NEGATIVE NEGATIVE Final    Comment: (NOTE) SARS-CoV-2 target nucleic acids are NOT DETECTED.  The SARS-CoV-2 RNA is generally detectable in upper respiratory specimens during the acute phase of infection. The lowest concentration of SARS-CoV-2 viral copies this assay can detect is 138 copies/mL. A negative result does not preclude SARS-Cov-2 infection and should not be used as the sole basis for treatment or other patient management decisions. A negative result may occur with  improper specimen collection/handling, submission of specimen other than nasopharyngeal swab, presence of viral mutation(s) within the areas targeted by this assay, and inadequate number of viral copies(<138 copies/mL). A negative result must be combined with clinical observations, patient history, and epidemiological information. The expected result is Negative.  Fact Sheet for Patients:  EntrepreneurPulse.com.au  Fact Sheet for Healthcare Providers:  IncredibleEmployment.be  This test is no t yet approved or cleared by the [place] FDA and  has been authorized for detection  and/or diagnosis of SARS-CoV-2 by FDA under an Emergency Use Authorization (EUA). This EUA will remain  in effect (meaning this test can be used) for the duration of the COVID-19 declaration under Section 564(b)(1) of the Act, 21 U.S.C.section 360bbb-3(b)(1), unless the authorization is terminated  or revoked sooner.       Influenza A by PCR NEGATIVE NEGATIVE Final   Influenza B by PCR NEGATIVE NEGATIVE Final    Comment: (NOTE) The Xpert Xpress SARS-CoV-2/FLU/RSV plus assay is intended as an aid in the diagnosis of influenza from Nasopharyngeal swab specimens and should not be used as a sole basis for treatment. Nasal washings and aspirates are unacceptable for Xpert Xpress SARS-CoV-2/FLU/RSV testing.  Fact Sheet for Patients: EntrepreneurPulse.com.au  Fact Sheet for Healthcare Providers: IncredibleEmployment.be  This test is not yet approved or cleared by the [place] FDA and has been authorized for detection and/or diagnosis of SARS-CoV-2 by FDA under an Emergency Use Authorization (EUA). This EUA will remain in effect (meaning this test can be used) for the duration of the COVID-19 declaration under Section 564(b)(1) of the Act, 21 U.S.C. section 360bbb-3(b)(1), unless the authorization is terminated or revoked.  Performed at [facility], [place] [address] [place], [place] [address]   Blood Culture (routine x 2)     Status: None   Collection Time: 03/21/21  3:02 PM   Specimen: BLOOD  Result Value Ref Range Status   Specimen Description BLOOD SITE NOT SPECIFIED  Final   Special Requests   Final    BOTTLES DRAWN AEROBIC AND ANAEROBIC Blood Culture adequate volume   Culture   Final    NO GROWTH 5 DAYS Performed at [facility], [address], [place], [place] [address]    Report Status 03/26/2021 FINAL  Final  Blood Culture (routine x 2)     Status: None   Collection Time: 03/21/21  3:15 PM   Specimen: BLOOD  Result  Value Ref Range Status   Specimen Description BLOOD SITE NOT SPECIFIED  Final   Special Requests   Final    BOTTLES DRAWN AEROBIC  AND ANAEROBIC Blood Culture adequate volume   Culture   Final    NO GROWTH 5 DAYS Performed at [facility], [place] [address] [place], [place] [address]    Report Status 03/26/2021 FINAL  Final  Urine Culture     Status: Abnormal   Collection Time: 03/21/21  6:10 PM   Specimen: In/Out Cath Urine  Result Value Ref Range Status   Specimen Description IN/OUT CATH URINE  Final   Special Requests   Final    NONE Performed at [facility], [place] [address] [place], [place] [address]    Culture 40,000 COLONIES/mL PSEUDOMONAS AERUGINOSA (A)  Final   Report Status 03/23/2021 FINAL  Final   Organism ID, Bacteria PSEUDOMONAS AERUGINOSA (A)  Final      Susceptibility   Pseudomonas aeruginosa - MIC*    CEFTAZIDIME 2 SENSITIVE Sensitive     CIPROFLOXACIN <=0.25 SENSITIVE Sensitive     GENTAMICIN <=1 SENSITIVE Sensitive     IMIPENEM 2 SENSITIVE Sensitive     PIP/TAZO <=4 SENSITIVE Sensitive     CEFEPIME 2 SENSITIVE Sensitive     * 40,000 COLONIES/mL PSEUDOMONAS AERUGINOSA  MRSA Next Gen by PCR, Nasal     Status: None   Collection Time: 03/21/21  7:46 PM   Specimen: Nasal Mucosa; Nasal Swab  Result Value Ref Range Status   MRSA by PCR Next Gen NOT DETECTED NOT DETECTED Final    Comment: (NOTE) The GeneXpert MRSA Assay (FDA approved for NASAL specimens only), is one component of a comprehensive MRSA colonization surveillance program. It is not intended to diagnose MRSA infection nor to guide or monitor treatment for MRSA infections. Test performance is not FDA approved in patients less than 73 years old. Performed at [facility], [place] [address] [place], [place] [address]          Radiology Studies: No results found.      Scheduled Meds:  chlorhexidine  15 mL Mouth Rinse BID   scopolamine  1 patch Transdermal Q72H   sodium  chloride flush  3 mL Intravenous Q12H   Continuous Infusions:  sodium chloride Stopped (03/24/21 0133)   sodium chloride       LOS: 5 days    Time spent: 30 minutes    Barb Merino, MD [facility] Pager [phone]

## 2021-03-26 NOTE — Care Management Important Message (Signed)
Important Message  Patient Details  Name: Zachary Decker MRN: [id number] Date of Birth: [birth year]   Medicare Important Message Given:  Yes     Dawn Kiper Montine Circle 03/26/2021, 3:19 PM

## 2021-03-26 NOTE — Progress Notes (Signed)
Spoke to pt's daughter and she feels that it is not necessary to discharge pt tomorrow ,wishes for pt to remain in hospital for palliative care

## 2021-03-26 NOTE — Progress Notes (Signed)
Wasted 10mg  of morphine solution to stericycle with Jamelle Haring, Therapist, sports.

## 2021-03-26 NOTE — Progress Notes (Signed)
   Palliative Medicine Inpatient Follow Up Note     Chart Reviewed. Patient assessed at the bedside.   Somnolent. Much less responsive today compared to my visit on yesterday, however does awakens briefly stating he is "waiting to die" and does not wish to be miserable.  Wife and granddaughter at the bedside. Wife expressing concerns with transferring patient home given obvious decline over past 24hrs with fear of death during transport or on arrival at home. Support provided.   No oral intake. Hypotensive. Appears comfortable. Education provided on ongoing comfort care measures, symptom management, and expectations at end-of-life. Additional education provided on consideration for residential hospice for continued support with awareness medical team will continue to evaluate for stability to transfer. Family verbalized understanding however wife hesitant to move requesting additional time to consider and evaluate.   Family advise plans to continue evaluating stability and consideration for hospice facility over the next 24-48hrs if he remains stable for transport.   Questions addressed and support provided.    Objective Assessment: Vital Signs Vitals:   03/26/21 0555 03/26/21 0816  BP: (!) 114/46 95/78  Pulse: (!) 55 66  Resp: 10 16  Temp: 97.6 F (36.4 C) 97.7 F (36.5 C)  SpO2: 96% 95%    Intake/Output Summary (Last 24 hours) at 03/26/2021 1450 Last data filed at 03/26/2021 0702 Gross per 24 hour  Intake --  Output 150 ml  Net -150 ml   Last Weight  Most recent update: 03/24/2021  4:59 AM    Weight  93.1 kg (205 lb 4 oz)            Gen:  Appears comfort, frail, ill appearing  HEENT: moist mucous membranes CV: RRR PULM: diminished bilaterally, room air  ABD: soft/nontender/nondistended/normal bowel sounds EXT: bilarteral lower extremity edema  Neuro: lethargic, answers some questions   SUMMARY OF RECOMMENDATIONS   Continue with comfort focused care Education  provided to family on residential hospice vs anticipated hospital death. Family aware team will continue to evaluate over the next 24hrs for stability to consider safe transfer to hospice home and will make referral at that time.  Aggressive symptom management  PMT will continue to support and follow. Please secure chat for urgent needs.   Time Total: 40 min.   Visit consisted of counseling and education dealing with the complex and emotionally intense issues of symptom management and palliative care in the setting of serious and potentially life-threatening illness.Greater than 50%  of this time was spent counseling and coordinating care related to the above assessment and plan.  Alda Lea, AGPCNP-BC  Palliative Medicine Team [phone]  Palliative Medicine Team providers are available by phone from 7am to 7pm daily and can be reached through the team cell phone. Should this patient require assistance outside of these hours, please call the patient's attending physician.

## 2021-03-26 NOTE — Care Management Important Message (Signed)
Important Message  Patient Details  Name: Zachary Decker MRN: [id number] Date of Birth: August 11, 1932   Medicare Important Message Given:  Yes Patient is at the End Of Life out of respect  did not enter patiant roo,m     Orbie Pyo 03/26/2021, 3:44 PM

## 2021-03-27 DIAGNOSIS — D72829 Elevated white blood cell count, unspecified: Secondary | ICD-10-CM

## 2021-03-27 DIAGNOSIS — E162 Hypoglycemia, unspecified: Secondary | ICD-10-CM | POA: Diagnosis present

## 2021-03-27 DIAGNOSIS — R4182 Altered mental status, unspecified: Secondary | ICD-10-CM | POA: Diagnosis not present

## 2021-03-27 DIAGNOSIS — R627 Adult failure to thrive: Secondary | ICD-10-CM | POA: Diagnosis present

## 2021-03-27 DIAGNOSIS — Z7189 Other specified counseling: Secondary | ICD-10-CM | POA: Diagnosis not present

## 2021-03-27 DIAGNOSIS — Z515 Encounter for palliative care: Secondary | ICD-10-CM | POA: Diagnosis not present

## 2021-03-27 DIAGNOSIS — A419 Sepsis, unspecified organism: Secondary | ICD-10-CM | POA: Diagnosis not present

## 2021-03-27 DIAGNOSIS — I5033 Acute on chronic diastolic (congestive) heart failure: Secondary | ICD-10-CM | POA: Diagnosis present

## 2021-03-27 DIAGNOSIS — R1310 Dysphagia, unspecified: Secondary | ICD-10-CM

## 2021-03-27 MED ORDER — FOOD THICKENER (SIMPLYTHICK HONEY): 1.0000 | ORAL | Status: DC | PRN

## 2021-03-27 NOTE — Progress Notes (Signed)
This chaplain is present for F/U spiritual care.  The Pt. RN-Joan updated the chaplain, the Pt. daughter stepped away and the Pt. wife is expected later today. The visit consisted of 30 minutes of shared conversation.  The Pt. accepted the chaplain's invitation to sit with the Pt. at the bedside. The chaplain listened to the Pt. profession of faith and understanding of eternal life. The Pt. and chaplain shared prayer together.   The Pt. extended an invitation to visit with his wife.

## 2021-03-27 NOTE — Assessment & Plan Note (Signed)
Monitor work

## 2021-03-27 NOTE — Assessment & Plan Note (Signed)
Associated with pseudomonal septic shock.

## 2021-03-27 NOTE — Assessment & Plan Note (Signed)
Associated with dysphagia and Parkinson's disease.  Monitor.

## 2021-03-27 NOTE — Assessment & Plan Note (Signed)
Mentation improving.  Monitor.

## 2021-03-27 NOTE — Assessment & Plan Note (Signed)
Blood pressure stable.  Monitor. 

## 2021-03-27 NOTE — Assessment & Plan Note (Signed)
Causing urinary retention.  Foley catheter inserted.

## 2021-03-27 NOTE — Assessment & Plan Note (Signed)
On medication prior to admission.  Currently no chest pain.  Complicated.

## 2021-03-27 NOTE — Assessment & Plan Note (Signed)
Continue Foley catheter for end-of-life.

## 2021-03-27 NOTE — Assessment & Plan Note (Signed)
Treated in the ICU with IV antibiotics Patient decided to comfort.  Family currently in agreement.  Residential hospice would likely be better disposition for the patient.

## 2021-03-27 NOTE — Assessment & Plan Note (Signed)
Renal function stable.  Not checked in a while.

## 2021-03-27 NOTE — Progress Notes (Signed)
Daily Progress Note   Patient Name: Zachary Decker       Date: 03/27/2021 DOB: Aug 08, 1932  Age: 87 y.o. MRN#: [id number] Attending Physician: Lavina Hamman, MD Primary Care Physician: [name], MD Admit Date: 03/21/2021 Length of Stay: 6 days  Reason for Consultation/Follow-up: Establishing goals of care  HPI/Patient Profile:  Palliative Care consult requested for goals of care discussion in this 87 y.o. male  with past medical history of hypertension, hyperlipidemia, PVD, hypothyroidism, MI, Parkinsons, CAD, MSSA bacteremia, and GERD. He was admitted on 03/21/2021 from home with generalized weakness, fatigue, and hypotension. Patient received 0.5mg  of epi prior to arrival via EMS. Since admission no improvement in hypotension despite levophed and multiple boluses. Family and patient requesting comfort and consideration for hospice given no clinical improvement.   Yesterday he was much less responsive and family expressed concerns about caring for him at home so it was determined to anticipate a hospital death. Today he is a bit more awake, answers questions but appears very sleepy/weak. Family requesting consideration of [employer].  Subjective:   Subjective: Chart Reviewed. Updates received. Patient Assessed. Created space and opportunity for patient  and family to explore thoughts and feelings regarding current medical situation.  Today's Discussion: Spoke with the patient at bedside. He denies pain, N/V, dyspnea, or anxiety. States he's doing great. Eyes closed the majority of the time speaking with him. Speaking 1-2 words at a time.  Called and spoke with his daughter who states they would be interested in transfer to residential hospice given his change and possibility that death may not be "hours to days". She asked for confirmation that he is still end of life. Asked about bringing him food that he requests for which I said that's ok, at end of life/comfort care we're not  concerned about aspiration/pna. However, warned her it may be disconcerting if he starts coughing and choking, which she verbalized understanding. I informed her I would reacy out to [facility] for consideration/bed availability for [facility].  Review of Systems  Constitutional:  Positive for fatigue.       Denies pain in general  Respiratory:  Negative for shortness of breath.   Cardiovascular:  Negative for chest pain.  Gastrointestinal:  Negative for abdominal pain, nausea and vomiting.   Objective:   Vital Signs:  BP 131/61 (BP Location: Right Arm)   Pulse 60   Temp 98 F (36.7 C) (Oral)   Resp 19   Wt 93.1 kg   SpO2 93%   BMI 27.08 kg/m   Physical Exam: Physical Exam Vitals and nursing note reviewed.  Constitutional:      General: He is sleeping. He is not in acute distress.    Appearance: He is ill-appearing.     Comments: Keeps eye closed for most of conversation  HENT:     Head: Normocephalic and atraumatic.  Cardiovascular:     Rate and Rhythm: Normal rate.  Pulmonary:     Effort: Pulmonary effort is normal.  Abdominal:     General: Abdomen is flat.     Palpations: Abdomen is soft.  Skin:    General: Skin is warm and dry.     Comments: Multiple UE bruises  Neurological:     Mental Status: He is easily aroused.    Palliative Assessment/Data: 10%   Assessment & Plan:   Impression: Present on Admission: **None**  87 year old male on comfort care/end of life from multiple comorbidities and sepsis/shock. Transitioned to  comfort, anticipated hospital death, family unable to care for him at home. Now with some more awareness, considering transfer to residential hospice.  SUMMARY OF RECOMMENDATIONS   Continue comfort care Referral/call placed to [facility] for consideration of residential hospice PMT to continue to follow  Code Status: DNR  Prognosis: < 2 weeks  Discharge Planning: Hospice facility  Discussed with: Medical team, nursing team, patient,  family  Thank you for allowing [place] to participate in the care of JUN OSMENT PMT will continue to support holistically.  Time Total: 35 min  Visit consisted of counseling and education dealing with the complex and emotionally intense issues of symptom management and palliative care in the setting of serious and potentially life-threatening illness. Greater than 50%  of this time was spent counseling and coordinating care related to the above assessment and plan.  Walden Field, NP Palliative Medicine Team  Team Phone # [phone] (Nights/Weekends)  02/19/2021, 8:17 AM

## 2021-03-27 NOTE — Assessment & Plan Note (Signed)
From poor p.o. intake.  Monitor.

## 2021-03-27 NOTE — Assessment & Plan Note (Addendum)
Currently comfort care.  Continue current regimen of pain medication and anxiety medication.  Allow patient to have p.o. diet.  Appears to be stable to be transferred to residential hospice.  Remains a ideal candidate for residential hospice.  Was sleeping for the most part of the day.

## 2021-03-27 NOTE — Progress Notes (Signed)
  Progress Note    Zachary Decker   BSW:[id number]  DOB: 1932/07/28  DOA: 03/21/2021     6 Date of Service: 03/27/2021   Subjective:  No nausea no vomiting.  More alert but having more difficulty speaking.  Hospital Problems * Pseudomonal septic shock ([place]) Treated in the ICU with IV antibiotics Patient decided to comfort.  Family currently in agreement.  Residential hospice would likely be better disposition for the patient.  End of life care Currently comfort care.  Continue current regimen of pain medication and anxiety medication.  Allow patient to have p.o. diet.  Appears to be stable to be transferred to residential hospice.  Leukocytosis Associated with infection.  Cellulitis of lower extremity Associated with pseudomonal septic shock.  Acute metabolic encephalopathy Mentation improving.  Monitor.  Failure to thrive in adult Associated with dysphagia and Parkinson's disease.  Monitor.  Dysphagia Placing the patient at high risk for aspiration.  Currently involving D1 diet.  Bradycardia Monitor work  Acute on chronic diastolic CHF (congestive heart failure) (HCC) Volume mildly elevated.  Monitor.  Hypoglycemia without diagnosis of diabetes mellitus From poor p.o. intake.  Monitor.  AKI (acute kidney injury) ([place]) Renal function stable.  Not checked in a while.  Parkinson's disease ([place]) Medications currently on hold.  Monitor.  Acute urinary retention Continue Foley catheter for end-of-life.  BPH (benign prostatic hyperplasia) Causing urinary retention.  Foley catheter inserted.  CAD w/ hx MI s/p CABG On medication prior to admission.  Currently no chest pain.  Complicated.  Essential hypertension Blood pressure stable.  Monitor.  Hypothyroidism Currently comfort care.     Objective Vital signs were reviewed and unremarkable.  Vitals:   03/25/21 1849 03/26/21 0555 03/26/21 0816 03/27/21 0747  BP: 122/64 (!) 114/46 95/78 131/61  Pulse: 65  (!) 55 66 60  Resp: 11 10 16 19   Temp: 98.3 F (36.8 C) 97.6 F (36.4 C) 97.7 F (36.5 C) 98 F (36.7 C)  TempSrc: Oral Oral Axillary Oral  SpO2: 93% 96% 95% 93%  Weight:       93.1 kg  Exam Physical Exam Constitutional:      General: He is in acute distress.     Appearance: Normal appearance.  HENT:     Mouth/Throat:     Mouth: Mucous membranes are moist.  Eyes:     Pupils: Pupils are equal, round, and reactive to light.  Cardiovascular:     Rate and Rhythm: Normal rate and regular rhythm.  Pulmonary:     Effort: Pulmonary effort is normal.     Breath sounds: Rales present. No wheezing.  Abdominal:     General: Bowel sounds are normal.  Musculoskeletal:        General: Swelling present.  Neurological:     General: No focal deficit present.     Mental Status: He is alert.     Labs / Other Information There are no new results to review at this time.    Time spent: [facility] 03/27/2021, 9:19 PM

## 2021-03-27 NOTE — Assessment & Plan Note (Signed)
Volume mildly elevated.  Monitor.

## 2021-03-27 NOTE — Assessment & Plan Note (Signed)
Currently comfort care.

## 2021-03-27 NOTE — Assessment & Plan Note (Signed)
Associated with infection.

## 2021-03-27 NOTE — Assessment & Plan Note (Signed)
Placing the patient at high risk for aspiration.  Currently involving D1 diet.

## 2021-03-27 NOTE — Assessment & Plan Note (Signed)
Medications currently on hold.  Monitor.

## 2021-03-28 DIAGNOSIS — A4152 Sepsis due to Pseudomonas: Secondary | ICD-10-CM

## 2021-03-28 NOTE — Plan of Care (Signed)
  Problem: Clinical Measurements: Goal: Ability to maintain clinical measurements within normal limits will improve Outcome: Not Met (add Reason)   Problem: Clinical Measurements: Goal: Diagnostic test results will improve Outcome: Not Met (add Reason)   Problem: Nutrition: Goal: Adequate nutrition will be maintained Outcome: Not Met (add Reason)

## 2021-03-28 NOTE — Progress Notes (Signed)
  Progress Note    LOVEL SUAZO   MAU:[id number]  DOB: 1932/12/06  DOA: 03/21/2021     7 Date of Service: 03/28/2021   Subjective:  Family reports anxiety as well as pain.  No nausea or vomiting.  Sleeping after receiving anxiety and pain medication.  Hospital Problems * Pseudomonal septic shock ([place]) Treated in the ICU with IV antibiotics Patient decided to comfort.  Family currently in agreement.  Residential hospice would likely be better disposition for the patient.  End of life care Currently comfort care.  Continue current regimen of pain medication and anxiety medication.  Allow patient to have p.o. diet.  Appears to be stable to be transferred to residential hospice.  Remains a ideal candidate for residential hospice.  Was sleeping for the most part of the day.  Leukocytosis Associated with infection.  Cellulitis of lower extremity Associated with pseudomonal septic shock.  Acute metabolic encephalopathy Mentation improving.  Monitor.  Failure to thrive in adult Associated with dysphagia and Parkinson's disease.  Monitor.  Dysphagia Placing the patient at high risk for aspiration.  Currently involving D1 diet.  Bradycardia Monitor work  Acute on chronic diastolic CHF (congestive heart failure) (HCC) Volume mildly elevated.  Monitor.  Hypoglycemia without diagnosis of diabetes mellitus From poor p.o. intake.  Monitor.  AKI (acute kidney injury) ([place]) Renal function stable.  Not checked in a while.  Parkinson's disease ([place]) Medications currently on hold.  Monitor.  Acute urinary retention Continue Foley catheter for end-of-life.  BPH (benign prostatic hyperplasia) Causing urinary retention.  Foley catheter inserted.  CAD w/ hx MI s/p CABG On medication prior to admission.  Currently no chest pain.  Complicated.  Essential hypertension Blood pressure stable.  Monitor.  Hypothyroidism Currently comfort care.     Objective Vital signs were  reviewed and unremarkable.  Vitals:   03/27/21 0747 03/28/21 0418 03/28/21 0817 03/28/21 1638  BP: 131/61 (!) 128/57 136/68 (!) 108/43  Pulse: 60 67 65 (!) 58  Resp: 19 17 18 19   Temp: 98 F (36.7 C) 98.7 F (37.1 C) 97.8 F (36.6 C) 98.2 F (36.8 C)  TempSrc: Oral Oral Oral Oral  SpO2: 93% 93% 94% 95%  Weight:       93.1 kg  Exam Exam limited due to patient's lethargy.  Labs / Other Information Not done.    Time spent: [facility] 03/28/2021, 7:15 PM

## 2021-03-28 NOTE — Progress Notes (Signed)
Palliative Medicine Inpatient Follow Up Note     Chart Reviewed. Patient assessed at the bedside.   Patient is somnolent.  Briefly awaken and answer a few questions however seems somewhat confused.  Appears comfortable, no acute distress noted.  Requiring as needed medications for pain and agitation.  Wife and granddaughter at the bedside.  Updates provided.  Granddaughter also reached out to her mother to allow for updates by phone.  Family shares patient was much more awake on yesterday although continued to have some episodes of confusion in terms of speaking with deceased family members and stating he had angels on his shoulders.  Wife Ivin Booty patient requested on several occasions yesterday to have a hamburger.  Extensive discussions with family regarding high risk for aspiration however in the setting of comfort if patient is awake and alert to tolerate small amounts/bites of a hamburger willing to allow him this opportunity for comfort again with emphasis on risk of aspiration.  Family verbalized understanding and appreciation expressing they with not encourage such diet however if patient is adamant they would like to have the opportunity to be able to provide this to him.  Request acknowledged.  Education provided on outpatient hospice versus residential hospice.  Patient is stable and symptoms to be considered for the hospice home if approved.  Family is requesting [facility] as their preferred location.  They have been made aware there is not a bed to offer on today.  Questions addressed and support provided.    Objective Assessment: Vital Signs Vitals:   03/28/21 0418 03/28/21 0817  BP: (!) 128/57 136/68  Pulse: 67 65  Resp: 17 18  Temp: 98.7 F (37.1 C) 97.8 F (36.6 C)  SpO2: 93% 94%    Intake/Output Summary (Last 24 hours) at 03/28/2021 1331 Last data filed at 03/27/2021 2300 Gross per 24 hour  Intake 315 ml  Output 500 ml  Net -185 ml   Last Weight  Most recent  update: 03/24/2021  4:59 AM    Weight  93.1 kg (205 lb 4 oz)            Gen: Somnolent, NAD, frail HEENT: moist mucous membranes CV: Regular rate and rhythm, no murmurs rubs or gallops PULM: Coarse bilaterally ABD: soft/nontender/nondistended/normal bowel sounds EXT: Bilateral lower extremity edema Neuro: Somnolent, will answer some questions appropriately with some noticeable confusion  SUMMARY OF RECOMMENDATIONS   Continue all care to focus on comfort Manage all symptoms aggressively with PRNS Pending approval and bed availability for hospice home with expressed preference for [facility] [place]. TOC and Chrislyn, RN [facility]) aware.  Cancer discussion and education provided in regards to patient's risk of aspiration.  Family is expressing ability to give patient a hamburger (and a small amount) if he continues to request.  They acknowledge risk of aspiration.  In the setting of comfort focused care patient will be allowed foods of choice if awake, alert and able to tolerate with aspiration risk in place.  Comfort feeds. PMT will continue to support and follow on as needed basis. Please secure chat for urgent needs.    Time Total: 45 min.   Visit consisted of counseling and education dealing with the complex and emotionally intense issues of symptom management and palliative care in the setting of serious and potentially life-threatening illness.Greater than 50%  of this time was spent counseling and coordinating care related to the above assessment and plan.  Alda Lea, AGPCNP-BC  Palliative Medicine Team [phone]  Palliative Medicine  Team providers are available by phone from 7am to 7pm daily and can be reached through the team cell phone. Should this patient require assistance outside of these hours, please call the patient's attending physician.

## 2021-03-28 NOTE — Plan of Care (Signed)

## 2021-03-28 NOTE — Care Management (Signed)
Called Chrislyn with [facility] with referral for [employer]. She will review clinicals.

## 2021-03-29 MED ORDER — SCOPOLAMINE 1 MG/3DAYS TD PT72: 1.0000 | MEDICATED_PATCH | TRANSDERMAL | 12 refills | Status: AC

## 2021-03-29 MED ORDER — MORPHINE SULFATE 10 MG/5ML PO SOLN: 10.0000 mg | ORAL | 0 refills | Status: AC | PRN

## 2021-03-29 NOTE — Discharge Summary (Signed)
Physician Discharge Summary   Patient name: Zachary Decker  Admit date:     03/21/2021  Discharge date: 03/29/2021  Attending Physician: Audria Nine [[id number]]  Discharge Physician: Berle Mull   PCP: [name], MD    Follow-up Information     [facility] Follow up.   Contact information: [place] [id number]                Recommendations at discharge: continue comfort care  Discharge Diagnoses Principal Problem:   Pseudomonal septic shock ([place]) Active Problems:   Acute metabolic encephalopathy   Cellulitis of lower extremity   Leukocytosis   End of life care   Bradycardia   Dysphagia   Failure to thrive in adult   BPH (benign prostatic hyperplasia)   Acute urinary retention   Parkinson's disease ([place])   AKI (acute kidney injury) ([place])   Hypoglycemia without diagnosis of diabetes mellitus   Acute on chronic diastolic CHF (congestive heart failure) (HCC)   CAD w/ hx MI s/p CABG   Hypothyroidism   Essential hypertension   Resolved Diagnoses Resolved Problems:   * No resolved hospital problems. [facility] Course   9/29 admitted to ICU for septic shock needing levo; started cefepime, vanc, flagyl 9/30 bradycardia with elevated troponin and low EF on Echo >> cardiology consulted 10/1 off pressors, still has low heart rate at times 10/2, remained on persistently poor clinical status and comfort care level of care is started.   * Pseudomonal septic shock ([place]) Treated in the ICU with IV antibiotics Patient decided to comfort.  Family currently in agreement.  Residential hospice would likely be better disposition for the patient.  End of life care Currently comfort care.  Continue current regimen of pain medication and anxiety medication.  Allow patient to have p.o. diet.  Appears to be stable to be transferred to residential hospice.  Remains a ideal candidate for residential hospice.  Was sleeping for  the most part of the day.  Leukocytosis Associated with infection.  Cellulitis of lower extremity Associated with pseudomonal septic shock.  Acute metabolic encephalopathy Mentation improving.  Monitor.  Failure to thrive in adult Associated with dysphagia and Parkinson's disease.  Monitor.  Dysphagia Placing the patient at high risk for aspiration.  Currently involving D1 diet.  Bradycardia Monitor work  Acute on chronic diastolic CHF (congestive heart failure) (HCC) Volume mildly elevated.  Monitor.  Hypoglycemia without diagnosis of diabetes mellitus From poor p.o. intake.  Monitor.  AKI (acute kidney injury) ([place]) Renal function stable.  Not checked in a while.  Parkinson's disease ([place]) Medications currently on hold.  Monitor.  Acute urinary retention Continue Foley catheter for end-of-life.  BPH (benign prostatic hyperplasia) Causing urinary retention.  Foley catheter inserted.  CAD w/ hx MI s/p CABG On medication prior to admission.  Currently no chest pain.  Complicated.  Essential hypertension Blood pressure stable.  Monitor.  Hypothyroidism Currently comfort care.      Procedures performed: Echocardiogram    Condition at discharge: stable  Exam Physical Exam Constitutional:      General: He is sleeping. He is not in acute distress. HENT:     Mouth/Throat:     Mouth: Mucous membranes are dry.  Cardiovascular:     Rate and Rhythm: Normal rate and regular rhythm.  Pulmonary:     Breath sounds: Rales present.  Abdominal:     General: Bowel sounds are normal.  Musculoskeletal:  General: Swelling present.  Neurological:     Mental Status: He is lethargic.     Disposition: Hospice care  Discharge time: greater than 30 minutes. Allergies as of 03/29/2021       Reactions   Losartan Potassium Other (See Comments)   Not known (was told he had an allergy)   Naproxen Nausea And Vomiting   Penicillins Rash   Did it involve swelling  of the face/tongue/throat, SOB, or low BP? No Did it involve sudden or severe rash/hives, skin peeling, or any reaction on the inside of your mouth or nose? Yes Did you need to seek medical attention at a hospital or doctor's office? Unknown When did it last happen?       If all above answers are "NO", may proceed with cephalosporin use. TOLERATED CEFTRIAXONE 10/07/2018        Medication List     STOP taking these medications    aspirin EC 81 MG tablet   carbidopa-levodopa 25-100 MG tablet Commonly known as: Sinemet   carbidopa-levodopa 50-200 MG tablet Commonly known as: SINEMET CR   [place] Plus Tabs   furosemide 40 MG tablet Commonly known as: LASIX   hydrOXYzine 10 MG tablet Commonly known as: ATARAX/VISTARIL   levothyroxine 50 MCG tablet Commonly known as: SYNTHROID   lisinopril 2.5 MG tablet Commonly known as: ZESTRIL   nitroGLYCERIN 0.4 MG SL tablet Commonly known as: NITROSTAT   pantoprazole 40 MG tablet Commonly known as: PROTONIX   simvastatin 20 MG tablet Commonly known as: ZOCOR   tamsulosin 0.4 MG Caps capsule Commonly known as: FLOMAX   TYLENOL EXTRA STRENGTH PO   vitamin B-12 500 MCG tablet Commonly known as: CYANOCOBALAMIN   vitamin C 1000 MG tablet   Vitamin D3 125 MCG (5000 UT) Tbdp   zinc sulfate 220 (50 Zn) MG capsule       TAKE these medications    morphine 10 MG/5ML solution Take 5 mLs (10 mg total) by mouth every hour as needed for severe pain or moderate pain (shortness of breath, air hunger).   scopolamine 1 MG/3DAYS Commonly known as: TRANSDERM-SCOP Place 1 patch (1.5 mg total) onto the skin every 3 (three) days. Start taking on: March 31, 2021               Discharge Care Instructions  (From admission, onward)           Start     Ordered   03/29/21 0000  Discharge wound care:       Comments: Buttocks Bilateral Foam dressing for comfort   03/29/21 1040            DG Chest Port 1 View  Result  Date: 03/21/2021 CLINICAL DATA:  Altered mental status and cellulitis EXAM: PORTABLE CHEST 1 VIEW COMPARISON:  10/07/2018 FINDINGS: Post sternotomy changes. Chronic pleural changes at the right CP angle with elevated right diaphragm. Cardiomegaly. Subsegmental atelectasis left base. Aortic atherosclerosis. No pneumothorax. IMPRESSION: Chronic scarring at the right base. Cardiomegaly with subsegmental atelectasis the base Electronically Signed   By: Donavan Foil M.D.   On: 03/21/2021 15:40   ECHOCARDIOGRAM COMPLETE  Result Date: 03/22/2021    ECHOCARDIOGRAM REPORT   Patient Name:   Zachary Decker Date of Exam: 03/22/2021 Medical Rec #:  [id number]       Height:       73.0 in Accession #:    [id number]      Weight:       207.0 lb  Date of Birth:  Sep 30, 1932      BSA:          2.183 m Patient Age:    87 years        BP:           101/49 mmHg Patient Gender: M               HR:           56 bpm. Exam Location:  Inpatient Procedure: 2D Echo, Color Doppler and Cardiac Doppler Indications:    Cardiomegaly  History:        Patient has prior history of Echocardiogram examinations, most                 recent 04/23/2019. Acute MI and CAD; Risk Factors:Dyslipidemia                 and Hypertension.  Sonographer:    Maudry Mayhew MHA, RDMS, RVT, RDCS Referring Phys: [id number] Mick Sell  Sonographer Comments: Image acquisition challenging due to uncooperative patient. IMPRESSIONS  1. Left ventricular ejection fraction, by estimation, is 40 to 45%. The left ventricle has mildly decreased function. The left ventricle demonstrates regional wall motion abnormalities (see scoring diagram/findings for description). The left ventricular  internal cavity size was mildly dilated. Left ventricular diastolic parameters were normal. There is severe hypokinesis of the left ventricular, entire inferior wall, inferoseptal wall and septal wall.  2. Right ventricular systolic function is moderately reduced. The right ventricular size  is mildly enlarged. There is mildly elevated pulmonary artery systolic pressure. The estimated right ventricular systolic pressure is 67.5 mmHg.  3. Left atrial size was mildly dilated.  4. Ischemic tethering of the posterior leaflet is noted. The mitral valve is abnormal. Mild mitral valve regurgitation.  5. The aortic valve is tricuspid. Aortic valve regurgitation is trivial. Mild to moderate aortic valve sclerosis/calcification is present, without any evidence of aortic stenosis. Aortic valve mean gradient measures 4.0 mmHg.  6. The inferior vena cava is dilated in size with <50% respiratory variability, suggesting right atrial pressure of 15 mmHg. Comparison(s): Changes from prior study are noted. 04/23/2019: LVEF 45-50%, basal to mid inferior hypokinesis. FINDINGS  Left Ventricle: Left ventricular ejection fraction, by estimation, is 40 to 45%. The left ventricle has mildly decreased function. The left ventricle demonstrates regional wall motion abnormalities. Severe hypokinesis of the left ventricular, entire inferior wall, inferoseptal wall and septal wall. The left ventricular internal cavity size was mildly dilated. There is no left ventricular hypertrophy. Left ventricular diastolic parameters were normal. Right Ventricle: The right ventricular size is mildly enlarged. No increase in right ventricular wall thickness. Right ventricular systolic function is moderately reduced. There is mildly elevated pulmonary artery systolic pressure. The tricuspid regurgitant velocity is 2.56 m/s, and with an assumed right atrial pressure of 15 mmHg, the estimated right ventricular systolic pressure is 91.6 mmHg. Left Atrium: Left atrial size was mildly dilated. Right Atrium: Right atrial size was normal in size. Pericardium: There is no evidence of pericardial effusion. Mitral Valve: Ischemic tethering of the posterior leaflet is noted. The mitral valve is abnormal. Mild mitral valve regurgitation, with  posteriorly-directed jet. Tricuspid Valve: The tricuspid valve is grossly normal. Tricuspid valve regurgitation is mild. Aortic Valve: The aortic valve is tricuspid. Aortic valve regurgitation is trivial. Mild to moderate aortic valve sclerosis/calcification is present, without any evidence of aortic stenosis. Aortic valve mean gradient measures 4.0 mmHg. Aortic valve peak  gradient measures 14.6 mmHg.  Aortic valve area, by VTI measures 2.41 cm. Pulmonic Valve: The pulmonic valve was grossly normal. Pulmonic valve regurgitation is trivial. Aorta: The aortic root and ascending aorta are structurally normal, with no evidence of dilitation. Venous: The inferior vena cava is dilated in size with less than 50% respiratory variability, suggesting right atrial pressure of 15 mmHg. IAS/Shunts: No atrial level shunt detected by color flow Doppler.  LEFT VENTRICLE PLAX 2D LVIDd:         6.10 cm      Diastology LVIDs:         3.90 cm      LV e' medial:    6.90 cm/s LV PW:         0.80 cm      LV E/e' medial:  13.9 LV IVS:        0.70 cm      LV e' lateral:   13.80 cm/s LVOT diam:     2.20 cm      LV E/e' lateral: 6.9 LV SV:         81 LV SV Index:   37 LVOT Area:     3.80 cm  LV Volumes (MOD) LV vol d, MOD A2C: 103.9 ml LV vol d, MOD A4C: 98.7 ml LV vol s, MOD A2C: 36.7 ml LV vol s, MOD A4C: 64.6 ml LV SV MOD A2C:     67.2 ml LV SV MOD A4C:     98.7 ml LV SV MOD BP:      52.7 ml RIGHT VENTRICLE TAPSE (M-mode): 0.6 cm LEFT ATRIUM              Index       RIGHT ATRIUM           Index LA diam:        4.50 cm  2.06 cm/m  RA Area:     22.50 cm LA Vol (A2C):   103.0 ml 47.17 ml/m RA Volume:   62.20 ml  28.49 ml/m LA Vol (A4C):   45.8 ml  20.98 ml/m LA Biplane Vol: 67.4 ml  30.87 ml/m  AORTIC VALVE AV Area (Vmax):    2.03 cm AV Area (Vmean):   2.46 cm AV Area (VTI):     2.41 cm AV Vmax:           191.00 cm/s AV Vmean:          94.100 cm/s AV VTI:            0.337 m AV Peak Grad:      14.6 mmHg AV Mean Grad:      4.0 mmHg  LVOT Vmax:         102.00 cm/s LVOT Vmean:        60.900 cm/s LVOT VTI:          0.214 m LVOT/AV VTI ratio: 0.64  AORTA Ao Root diam: 3.40 cm MITRAL VALVE               TRICUSPID VALVE MV Area (PHT): 3.61 cm    TR Peak grad:   26.2 mmHg MV Decel Time: 210 msec    TR Vmax:        256.00 cm/s MR Peak grad: 71.9 mmHg MR Mean grad: 37.0 mmHg    SHUNTS MR Vmax:      424.00 cm/s  Systemic VTI:  0.21 m MR Vmean:     282.0 cm/s   Systemic Diam: 2.20 cm MV E velocity: 95.65  cm/s MV A velocity: 93.00 cm/s MV E/A ratio:  1.03 Lyman Bishop MD Electronically signed by Lyman Bishop MD Signature Date/Time: 03/22/2021/12:37:09 PM    Final    Results for orders placed or performed during the hospital encounter of 03/21/21  Resp Panel by RT-PCR (Flu A&B, Covid) Nasopharyngeal Swab     Status: None   Collection Time: 03/21/21  3:02 PM   Specimen: Nasopharyngeal Swab; Nasopharyngeal(NP) swabs in vial transport medium  Result Value Ref Range Status   SARS Coronavirus 2 by RT PCR NEGATIVE NEGATIVE Final    Comment: (NOTE) SARS-CoV-2 target nucleic acids are NOT DETECTED.  The SARS-CoV-2 RNA is generally detectable in upper respiratory specimens during the acute phase of infection. The lowest concentration of SARS-CoV-2 viral copies this assay can detect is 138 copies/mL. A negative result does not preclude SARS-Cov-2 infection and should not be used as the sole basis for treatment or other patient management decisions. A negative result may occur with  improper specimen collection/handling, submission of specimen other than nasopharyngeal swab, presence of viral mutation(s) within the areas targeted by this assay, and inadequate number of viral copies(<138 copies/mL). A negative result must be combined with clinical observations, patient history, and epidemiological information. The expected result is Negative.  Fact Sheet for Patients:  EntrepreneurPulse.com.au  Fact Sheet for Healthcare  Providers:  IncredibleEmployment.be  This test is no t yet approved or cleared by the [place] FDA and  has been authorized for detection and/or diagnosis of SARS-CoV-2 by FDA under an Emergency Use Authorization (EUA). This EUA will remain  in effect (meaning this test can be used) for the duration of the COVID-19 declaration under Section 564(b)(1) of the Act, 21 U.S.C.section 360bbb-3(b)(1), unless the authorization is terminated  or revoked sooner.       Influenza A by PCR NEGATIVE NEGATIVE Final   Influenza B by PCR NEGATIVE NEGATIVE Final    Comment: (NOTE) The Xpert Xpress SARS-CoV-2/FLU/RSV plus assay is intended as an aid in the diagnosis of influenza from Nasopharyngeal swab specimens and should not be used as a sole basis for treatment. Nasal washings and aspirates are unacceptable for Xpert Xpress SARS-CoV-2/FLU/RSV testing.  Fact Sheet for Patients: EntrepreneurPulse.com.au  Fact Sheet for Healthcare Providers: IncredibleEmployment.be  This test is not yet approved or cleared by the [place] FDA and has been authorized for detection and/or diagnosis of SARS-CoV-2 by FDA under an Emergency Use Authorization (EUA). This EUA will remain in effect (meaning this test can be used) for the duration of the COVID-19 declaration under Section 564(b)(1) of the Act, 21 U.S.C. section 360bbb-3(b)(1), unless the authorization is terminated or revoked.  Performed at [facility], [place] [address] [place], [place] [address]   Blood Culture (routine x 2)     Status: None   Collection Time: 03/21/21  3:02 PM   Specimen: BLOOD  Result Value Ref Range Status   Specimen Description BLOOD SITE NOT SPECIFIED  Final   Special Requests   Final    BOTTLES DRAWN AEROBIC AND ANAEROBIC Blood Culture adequate volume   Culture   Final    NO GROWTH 5 DAYS Performed at [facility], [address], [place],  [place] [address]    Report Status 03/26/2021 FINAL  Final  Blood Culture (routine x 2)     Status: None   Collection Time: 03/21/21  3:15 PM   Specimen: BLOOD  Result Value Ref Range Status   Specimen Description BLOOD SITE  NOT SPECIFIED  Final   Special Requests   Final    BOTTLES DRAWN AEROBIC AND ANAEROBIC Blood Culture adequate volume   Culture   Final    NO GROWTH 5 DAYS Performed at [facility], [address], [place], [place] [address]    Report Status 03/26/2021 FINAL  Final  Urine Culture     Status: Abnormal   Collection Time: 03/21/21  6:10 PM   Specimen: In/Out Cath Urine  Result Value Ref Range Status   Specimen Description IN/OUT CATH URINE  Final   Special Requests   Final    NONE Performed at [facility], [place] [address] [place], [place] [address]    Culture 40,000 COLONIES/mL PSEUDOMONAS AERUGINOSA (A)  Final   Report Status 03/23/2021 FINAL  Final   Organism ID, Bacteria PSEUDOMONAS AERUGINOSA (A)  Final      Susceptibility   Pseudomonas aeruginosa - MIC*    CEFTAZIDIME 2 SENSITIVE Sensitive     CIPROFLOXACIN <=0.25 SENSITIVE Sensitive     GENTAMICIN <=1 SENSITIVE Sensitive     IMIPENEM 2 SENSITIVE Sensitive     PIP/TAZO <=4 SENSITIVE Sensitive     CEFEPIME 2 SENSITIVE Sensitive     * 40,000 COLONIES/mL PSEUDOMONAS AERUGINOSA  MRSA Next Gen by PCR, Nasal     Status: None   Collection Time: 03/21/21  7:46 PM   Specimen: Nasal Mucosa; Nasal Swab  Result Value Ref Range Status   MRSA by PCR Next Gen NOT DETECTED NOT DETECTED Final    Comment: (NOTE) The GeneXpert MRSA Assay (FDA approved for NASAL specimens only), is one component of a comprehensive MRSA colonization surveillance program. It is not intended to diagnose MRSA infection nor to guide or monitor treatment for MRSA infections. Test performance is not FDA approved in patients less than 56 years old. Performed at [facility], [place] [address] [place], [place] [address]      Signed:  Berle Mull MD.  [facility] 03/29/2021, 10:41 AM

## 2021-03-29 NOTE — Plan of Care (Signed)

## 2021-03-29 NOTE — Care Management (Addendum)
Signed DNR Form and PTAR paperwork on chart. Await consents to be completed.    1205 PTAR called.

## 2021-03-29 NOTE — Progress Notes (Signed)
Manufacturing engineer [facility]) Hospital Liaison note.    Chart reviewed and eligibility confirmed for [facility]. Spoke with family to confirm interest and explain services. Family agreeable to transfer today. TOC aware.    [facility] will notify TOC when registration paperwork has been completed to arrange transport.   RN please call report to [phone].  Please be sure a signed DNR form transports with the pt.  Thank you for the opportunity to participate in this patient's care.  Domenic Moras, BSN, RN [facility] Liaison (listed on [place] under [facility])    [phone] (24h on call)

## 2021-03-29 NOTE — Progress Notes (Signed)
[place] and gave report to [place]. Patient currently waiting for PTAR to arrive at which time IV will be removed.

## 2021-04-02 ENCOUNTER — Telehealth: Payer: Self-pay

## 2021-04-02 NOTE — Telephone Encounter (Signed)
The patient will certainly be missed.  I appreciate the update.  I spoke to the family when he transferred to hospice house[facility]

## 2021-04-02 NOTE — Telephone Encounter (Signed)
Butch Penny from [facility] called wanting to inform Dr Yong Channel that Zachary Decker passed away this morning.

## 2021-04-08 ENCOUNTER — Telehealth: Payer: Self-pay

## 2021-04-08 ENCOUNTER — Ambulatory Visit: Payer: Medicare PPO | Admitting: Podiatry

## 2021-04-08 NOTE — Telephone Encounter (Signed)
Spoke with family-patient will truly be missed.  Greatly appreciate the opportunity to be a part of his care

## 2021-04-08 NOTE — Telephone Encounter (Signed)
Olivia Mackie called to inform Dr. Yong Channel about patients passing on 10/11.  States that she really appreciated everything the office did for the patient and family.

## 2021-04-08 NOTE — Telephone Encounter (Signed)
FYI

## 2021-04-23 DEATH — deceased

## 2021-05-31 ENCOUNTER — Ambulatory Visit: Payer: Medicare PPO | Admitting: Neurology

## 2021-06-28 ENCOUNTER — Ambulatory Visit: Payer: Medicare PPO | Admitting: Family Medicine

## 2021-08-01 ENCOUNTER — Ambulatory Visit: Payer: Medicare PPO
